# Patient Record
Sex: Female | Born: 1970 | Race: Black or African American | Hispanic: No | Marital: Married | Smoking: Never smoker
Health system: Southern US, Community
[De-identification: ages and names within clinical notes are randomized; demographics above are authoritative.]

## PROBLEM LIST (undated history)

## (undated) DIAGNOSIS — E119 Type 2 diabetes mellitus without complications: Secondary | ICD-10-CM

## (undated) DIAGNOSIS — R000 Tachycardia, unspecified: Secondary | ICD-10-CM

## (undated) DIAGNOSIS — R002 Palpitations: Secondary | ICD-10-CM

## (undated) DIAGNOSIS — I10 Essential (primary) hypertension: Secondary | ICD-10-CM

## (undated) DIAGNOSIS — F419 Anxiety disorder, unspecified: Secondary | ICD-10-CM

## (undated) DIAGNOSIS — D649 Anemia, unspecified: Secondary | ICD-10-CM

## (undated) DIAGNOSIS — F32A Depression, unspecified: Secondary | ICD-10-CM

## (undated) DIAGNOSIS — G43909 Migraine, unspecified, not intractable, without status migrainosus: Secondary | ICD-10-CM

## (undated) DIAGNOSIS — D219 Benign neoplasm of connective and other soft tissue, unspecified: Secondary | ICD-10-CM

## (undated) DIAGNOSIS — E042 Nontoxic multinodular goiter: Secondary | ICD-10-CM

## (undated) DIAGNOSIS — R7611 Nonspecific reaction to tuberculin skin test without active tuberculosis: Secondary | ICD-10-CM

## (undated) DIAGNOSIS — K760 Fatty (change of) liver, not elsewhere classified: Secondary | ICD-10-CM

## (undated) DIAGNOSIS — R5383 Other fatigue: Secondary | ICD-10-CM

## (undated) DIAGNOSIS — F329 Major depressive disorder, single episode, unspecified: Secondary | ICD-10-CM

## (undated) HISTORY — DX: Anemia, unspecified: D64.9

## (undated) HISTORY — DX: Benign neoplasm of connective and other soft tissue, unspecified: D21.9

## (undated) HISTORY — DX: Depression, unspecified: F32.A

## (undated) HISTORY — DX: Anxiety disorder, unspecified: F41.9

## (undated) HISTORY — DX: Tachycardia, unspecified: R00.0

## (undated) HISTORY — DX: Nonspecific reaction to tuberculin skin test without active tuberculosis: R76.11

## (undated) HISTORY — DX: Migraine, unspecified, not intractable, without status migrainosus: G43.909

## (undated) HISTORY — DX: Nontoxic multinodular goiter: E04.2

## (undated) HISTORY — DX: Major depressive disorder, single episode, unspecified: F32.9

## (undated) HISTORY — DX: Other fatigue: R53.83

## (undated) HISTORY — DX: Fatty (change of) liver, not elsewhere classified: K76.0

## (undated) HISTORY — DX: Palpitations: R00.2

## (undated) HISTORY — DX: Essential (primary) hypertension: I10

## (undated) SURGERY — DILATION AND CURETTAGE
Anesthesia: General

---

## 1995-09-12 HISTORY — PX: TUBAL LIGATION: SHX77

## 2003-09-12 HISTORY — PX: TUBOPLASTY / TUBOTUBAL ANASTOMOSIS: SUR1392

## 2004-09-26 ENCOUNTER — Emergency Department: Payer: Self-pay | Admitting: Emergency Medicine

## 2004-09-29 ENCOUNTER — Ambulatory Visit: Payer: Self-pay

## 2006-04-10 ENCOUNTER — Ambulatory Visit: Payer: Self-pay | Admitting: Gynecology

## 2006-04-26 ENCOUNTER — Ambulatory Visit: Payer: Self-pay | Admitting: Obstetrics & Gynecology

## 2006-06-18 ENCOUNTER — Ambulatory Visit: Payer: Self-pay | Admitting: Gynecology

## 2006-07-10 ENCOUNTER — Emergency Department: Payer: Self-pay | Admitting: Emergency Medicine

## 2006-07-16 ENCOUNTER — Ambulatory Visit: Payer: Self-pay | Admitting: Gynecology

## 2007-09-12 DIAGNOSIS — R7611 Nonspecific reaction to tuberculin skin test without active tuberculosis: Secondary | ICD-10-CM

## 2007-09-12 HISTORY — DX: Nonspecific reaction to tuberculin skin test without active tuberculosis: R76.11

## 2007-10-17 ENCOUNTER — Ambulatory Visit (HOSPITAL_COMMUNITY): Admission: RE | Admit: 2007-10-17 | Discharge: 2007-10-17 | Payer: Self-pay | Admitting: Gynecology

## 2007-10-17 ENCOUNTER — Ambulatory Visit: Payer: Self-pay | Admitting: Gynecology

## 2007-10-23 ENCOUNTER — Inpatient Hospital Stay (HOSPITAL_COMMUNITY): Admission: AD | Admit: 2007-10-23 | Discharge: 2007-10-23 | Payer: Self-pay | Admitting: Gynecology

## 2009-03-11 HISTORY — PX: APPENDECTOMY: SHX54

## 2009-10-18 LAB — CONVERTED CEMR LAB: Pap Smear: NORMAL

## 2009-10-21 ENCOUNTER — Encounter (INDEPENDENT_AMBULATORY_CARE_PROVIDER_SITE_OTHER): Payer: Self-pay | Admitting: *Deleted

## 2009-11-13 ENCOUNTER — Encounter: Payer: Self-pay | Admitting: Emergency Medicine

## 2009-11-13 ENCOUNTER — Observation Stay (HOSPITAL_COMMUNITY): Admission: EM | Admit: 2009-11-13 | Discharge: 2009-11-14 | Payer: Self-pay | Admitting: Internal Medicine

## 2009-11-13 ENCOUNTER — Ambulatory Visit: Payer: Self-pay | Admitting: Diagnostic Radiology

## 2009-11-14 ENCOUNTER — Encounter (INDEPENDENT_AMBULATORY_CARE_PROVIDER_SITE_OTHER): Payer: Self-pay | Admitting: Internal Medicine

## 2009-11-14 ENCOUNTER — Encounter (INDEPENDENT_AMBULATORY_CARE_PROVIDER_SITE_OTHER): Payer: Self-pay | Admitting: *Deleted

## 2009-11-17 ENCOUNTER — Telehealth: Payer: Self-pay | Admitting: Family

## 2009-11-17 ENCOUNTER — Ambulatory Visit: Payer: Self-pay | Admitting: Family

## 2009-11-17 DIAGNOSIS — F418 Other specified anxiety disorders: Secondary | ICD-10-CM | POA: Insufficient documentation

## 2009-11-26 ENCOUNTER — Telehealth: Payer: Self-pay | Admitting: Family

## 2009-11-28 HISTORY — PX: BIOPSY THYROID: PRO38

## 2009-12-01 ENCOUNTER — Encounter (INDEPENDENT_AMBULATORY_CARE_PROVIDER_SITE_OTHER): Payer: Self-pay | Admitting: *Deleted

## 2009-12-22 ENCOUNTER — Encounter (INDEPENDENT_AMBULATORY_CARE_PROVIDER_SITE_OTHER): Payer: Self-pay | Admitting: *Deleted

## 2009-12-22 ENCOUNTER — Ambulatory Visit: Payer: Self-pay | Admitting: Family

## 2009-12-22 DIAGNOSIS — I10 Essential (primary) hypertension: Secondary | ICD-10-CM | POA: Insufficient documentation

## 2009-12-22 LAB — CONVERTED CEMR LAB
ALT: 11 units/L (ref 0–35)
AST: 15 units/L (ref 0–37)
Albumin: 4.5 g/dL (ref 3.5–5.2)
Alkaline Phosphatase: 61 units/L (ref 39–117)
BUN: 8 mg/dL (ref 6–23)
Basophils Absolute: 0 10*3/uL (ref 0.0–0.1)
Basophils Relative: 0 % (ref 0–1)
CO2: 24 meq/L (ref 19–32)
Calcium: 9.7 mg/dL (ref 8.4–10.5)
Chloride: 102 meq/L (ref 96–112)
Cholesterol: 162 mg/dL (ref 0–200)
Creatinine, Ser: 0.78 mg/dL (ref 0.40–1.20)
Eosinophils Absolute: 0.1 10*3/uL (ref 0.0–0.7)
Eosinophils Relative: 1 % (ref 0–5)
Glucose, Bld: 87 mg/dL (ref 70–99)
HCT: 34.4 % — ABNORMAL LOW (ref 36.0–46.0)
HDL: 47 mg/dL (ref >39)
Hemoglobin: 10.8 g/dL — ABNORMAL LOW (ref 12.0–15.0)
LDL Cholesterol: 102 mg/dL — ABNORMAL HIGH (ref 0–99)
Lymphocytes Relative: 32 % (ref 12–46)
Lymphs Abs: 2.5 10*3/uL (ref 0.7–4.0)
MCHC: 31.4 g/dL (ref 30.0–36.0)
MCV: 77.5 fL — ABNORMAL LOW (ref 78.0–100.0)
Monocytes Absolute: 0.6 10*3/uL (ref 0.1–1.0)
Monocytes Relative: 7 % (ref 3–12)
Neutro Abs: 4.9 10*3/uL (ref 1.7–7.7)
Neutrophils Relative %: 60 % (ref 43–77)
Platelets: 294 10*3/uL (ref 150–400)
Potassium: 4.8 meq/L (ref 3.5–5.3)
RBC: 4.44 M/uL (ref 3.87–5.11)
RDW: 17 % — ABNORMAL HIGH (ref 11.5–15.5)
Sodium: 137 meq/L (ref 135–145)
TSH: 1.071 microintl units/mL (ref 0.350–4.500)
Total Bilirubin: 0.6 mg/dL (ref 0.3–1.2)
Total CHOL/HDL Ratio: 3.4
Total Protein: 7.7 g/dL (ref 6.0–8.3)
Triglycerides: 66 mg/dL (ref <150)
VLDL: 13 mg/dL (ref 0–40)
WBC: 8.1 10*3/uL (ref 4.0–10.5)

## 2009-12-26 ENCOUNTER — Telehealth: Payer: Self-pay | Admitting: Family

## 2009-12-27 ENCOUNTER — Encounter: Payer: Self-pay | Admitting: Family

## 2009-12-27 LAB — CONVERTED CEMR LAB
Ferritin: 5 ng/mL — ABNORMAL LOW (ref 10–291)
Folate: 9.5 ng/mL
Iron: 14 ug/dL — ABNORMAL LOW (ref 42–145)
Saturation Ratios: 3 % — ABNORMAL LOW (ref 20–55)
TIBC: 450 ug/dL (ref 250–470)
UIBC: 436 ug/dL
Vitamin B-12: 357 pg/mL (ref 211–911)

## 2009-12-28 ENCOUNTER — Ambulatory Visit: Payer: Self-pay | Admitting: Hematology & Oncology

## 2009-12-28 ENCOUNTER — Ambulatory Visit: Payer: Self-pay

## 2009-12-28 ENCOUNTER — Encounter: Payer: Self-pay | Admitting: Cardiovascular Disease

## 2009-12-28 ENCOUNTER — Telehealth: Payer: Self-pay | Admitting: Family

## 2009-12-28 ENCOUNTER — Ambulatory Visit: Payer: Self-pay | Admitting: Cardiovascular Disease

## 2009-12-30 ENCOUNTER — Ambulatory Visit: Payer: Self-pay | Admitting: Diagnostic Radiology

## 2009-12-30 ENCOUNTER — Ambulatory Visit (HOSPITAL_BASED_OUTPATIENT_CLINIC_OR_DEPARTMENT_OTHER): Admission: RE | Admit: 2009-12-30 | Discharge: 2009-12-30 | Payer: Self-pay | Admitting: Internal Medicine

## 2009-12-30 LAB — HM MAMMOGRAPHY: HM Mammogram: NORMAL

## 2010-01-05 ENCOUNTER — Encounter: Payer: Self-pay | Admitting: Internal Medicine

## 2010-01-05 ENCOUNTER — Encounter: Payer: Self-pay | Admitting: Family

## 2010-01-05 LAB — CBC WITH DIFFERENTIAL (CANCER CENTER ONLY)
BASO#: 0 10*3/uL (ref 0.0–0.2)
Eosinophils Absolute: 0.1 10*3/uL (ref 0.0–0.5)
HCT: 28.5 % — ABNORMAL LOW (ref 34.8–46.6)
HGB: 9.3 g/dL — ABNORMAL LOW (ref 11.6–15.9)
LYMPH#: 2.6 10*3/uL (ref 0.9–3.3)
MONO#: 0.5 10*3/uL (ref 0.1–0.9)
NEUT%: 54.8 % (ref 39.6–80.0)
RBC: 3.79 10*6/uL (ref 3.70–5.32)
WBC: 7.1 10*3/uL (ref 3.9–10.0)

## 2010-01-05 LAB — TECHNOLOGIST REVIEW CHCC SATELLITE

## 2010-01-07 LAB — IRON AND TIBC
%SAT: 4 % — ABNORMAL LOW (ref 20–55)
Iron: 16 ug/dL — ABNORMAL LOW (ref 42–145)
TIBC: 451 ug/dL (ref 250–470)

## 2010-01-07 LAB — FERRITIN: Ferritin: 5 ng/mL — ABNORMAL LOW (ref 10–291)

## 2010-01-13 ENCOUNTER — Ambulatory Visit: Payer: Self-pay | Admitting: Family

## 2010-01-13 LAB — CONVERTED CEMR LAB: Fecal Occult Bld: NEGATIVE

## 2010-01-17 ENCOUNTER — Telehealth: Payer: Self-pay | Admitting: Family

## 2010-01-17 ENCOUNTER — Ambulatory Visit: Payer: Self-pay | Admitting: Family

## 2010-01-17 LAB — CONVERTED CEMR LAB
Basophils Absolute: 0 10*3/uL (ref 0.0–0.1)
Basophils Relative: 0 % (ref 0–1)
Eosinophils Absolute: 0 10*3/uL (ref 0.0–0.7)
Eosinophils Relative: 1 % (ref 0–5)
HCT: 35.9 % — ABNORMAL LOW (ref 36.0–46.0)
Hemoglobin: 10.9 g/dL — ABNORMAL LOW (ref 12.0–15.0)
Lymphocytes Relative: 28 % (ref 12–46)
Lymphs Abs: 1.9 10*3/uL (ref 0.7–4.0)
MCHC: 30.4 g/dL (ref 30.0–36.0)
MCV: 82.5 fL (ref 78.0–100.0)
Monocytes Absolute: 0.4 10*3/uL (ref 0.1–1.0)
Monocytes Relative: 6 % (ref 3–12)
Neutro Abs: 4.3 10*3/uL (ref 1.7–7.7)
Neutrophils Relative %: 65 % (ref 43–77)
Platelets: 252 10*3/uL (ref 150–400)
RBC: 4.35 M/uL (ref 3.87–5.11)
RDW: 21.1 % — ABNORMAL HIGH (ref 11.5–15.5)
WBC: 6.7 10*3/uL (ref 4.0–10.5)

## 2010-01-18 ENCOUNTER — Encounter: Payer: Self-pay | Admitting: Family

## 2010-02-15 ENCOUNTER — Ambulatory Visit: Payer: Self-pay | Admitting: Hematology & Oncology

## 2010-02-15 ENCOUNTER — Ambulatory Visit: Payer: Self-pay | Admitting: Family

## 2010-02-17 ENCOUNTER — Encounter: Payer: Self-pay | Admitting: Internal Medicine

## 2010-02-17 LAB — CBC WITH DIFFERENTIAL (CANCER CENTER ONLY)
BASO%: 0.5 % (ref 0.0–2.0)
Eosinophils Absolute: 0.1 10*3/uL (ref 0.0–0.5)
MCH: 28 pg (ref 26.0–34.0)
MONO#: 0.3 10*3/uL (ref 0.1–0.9)
MONO%: 4.7 % (ref 0.0–13.0)
NEUT#: 3.8 10*3/uL (ref 1.5–6.5)
Platelets: 182 10*3/uL (ref 145–400)
RBC: 4.75 10*6/uL (ref 3.70–5.32)
RDW: 19.8 % — ABNORMAL HIGH (ref 10.5–14.6)
WBC: 6.1 10*3/uL (ref 3.9–10.0)

## 2010-02-17 LAB — FERRITIN: Ferritin: 54 ng/mL (ref 10–291)

## 2010-03-16 ENCOUNTER — Encounter (INDEPENDENT_AMBULATORY_CARE_PROVIDER_SITE_OTHER): Payer: Self-pay | Admitting: *Deleted

## 2010-03-17 ENCOUNTER — Encounter (INDEPENDENT_AMBULATORY_CARE_PROVIDER_SITE_OTHER): Payer: Self-pay | Admitting: *Deleted

## 2010-04-07 ENCOUNTER — Encounter: Payer: Self-pay | Admitting: Cardiovascular Disease

## 2010-04-21 ENCOUNTER — Telehealth: Payer: Self-pay | Admitting: Family

## 2010-05-02 ENCOUNTER — Ambulatory Visit: Payer: Self-pay | Admitting: Family

## 2010-05-02 LAB — CONVERTED CEMR LAB
BUN: 12 mg/dL (ref 6–23)
Basophils Absolute: 0 10*3/uL (ref 0.0–0.1)
Basophils Relative: 0 % (ref 0–1)
CO2: 26 meq/L (ref 19–32)
Calcium: 9.5 mg/dL (ref 8.4–10.5)
Chloride: 107 meq/L (ref 96–112)
Creatinine, Ser: 0.86 mg/dL (ref 0.40–1.20)
Eosinophils Absolute: 0.1 10*3/uL (ref 0.0–0.7)
Eosinophils Relative: 1 % (ref 0–5)
Glucose, Bld: 98 mg/dL (ref 70–99)
HCT: 36.4 % (ref 36.0–46.0)
Hemoglobin: 12.2 g/dL (ref 12.0–15.0)
Lymphocytes Relative: 39 % (ref 12–46)
Lymphs Abs: 2.4 10*3/uL (ref 0.7–4.0)
MCHC: 33.5 g/dL (ref 30.0–36.0)
MCV: 87.1 fL (ref 78.0–100.0)
Monocytes Absolute: 0.4 10*3/uL (ref 0.1–1.0)
Monocytes Relative: 6 % (ref 3–12)
Neutro Abs: 3.3 10*3/uL (ref 1.7–7.7)
Neutrophils Relative %: 54 % (ref 43–77)
Platelets: 252 10*3/uL (ref 150–400)
Potassium: 4.7 meq/L (ref 3.5–5.3)
RBC: 4.18 M/uL (ref 3.87–5.11)
RDW: 15.2 % (ref 11.5–15.5)
Sodium: 140 meq/L (ref 135–145)
WBC: 6.2 10*3/uL (ref 4.0–10.5)

## 2010-05-03 ENCOUNTER — Encounter: Payer: Self-pay | Admitting: Family

## 2010-05-24 ENCOUNTER — Ambulatory Visit: Payer: Self-pay | Admitting: Hematology & Oncology

## 2010-05-24 DIAGNOSIS — D259 Leiomyoma of uterus, unspecified: Secondary | ICD-10-CM | POA: Insufficient documentation

## 2010-05-24 DIAGNOSIS — D508 Other iron deficiency anemias: Secondary | ICD-10-CM | POA: Insufficient documentation

## 2010-05-26 ENCOUNTER — Emergency Department (HOSPITAL_BASED_OUTPATIENT_CLINIC_OR_DEPARTMENT_OTHER): Admission: EM | Admit: 2010-05-26 | Discharge: 2010-05-26 | Payer: Self-pay | Admitting: Emergency Medicine

## 2010-05-26 ENCOUNTER — Ambulatory Visit: Payer: Self-pay | Admitting: Diagnostic Radiology

## 2010-05-26 ENCOUNTER — Telehealth: Payer: Self-pay | Admitting: Family

## 2010-05-27 ENCOUNTER — Ambulatory Visit: Payer: Self-pay | Admitting: Family

## 2010-05-27 ENCOUNTER — Telehealth: Payer: Self-pay | Admitting: Family

## 2010-05-27 DIAGNOSIS — N920 Excessive and frequent menstruation with regular cycle: Secondary | ICD-10-CM | POA: Insufficient documentation

## 2010-05-27 LAB — CONVERTED CEMR LAB: Iron: 33 ug/dL — ABNORMAL LOW (ref 42–145)

## 2010-05-30 ENCOUNTER — Encounter: Payer: Self-pay | Admitting: Cardiovascular Disease

## 2010-05-30 LAB — CONVERTED CEMR LAB
Metaneph Total, Ur: 622 ug/24hr
Metanephrines, Ur: 99
Normetanephrine, 24H Ur: 523

## 2010-06-01 ENCOUNTER — Ambulatory Visit: Payer: Self-pay | Admitting: Cardiovascular Disease

## 2010-06-01 ENCOUNTER — Encounter: Payer: Self-pay | Admitting: Family

## 2010-06-08 ENCOUNTER — Encounter: Payer: Self-pay | Admitting: Family

## 2010-06-10 ENCOUNTER — Ambulatory Visit: Payer: Self-pay | Admitting: Family

## 2010-06-13 ENCOUNTER — Telehealth: Payer: Self-pay | Admitting: Family

## 2010-06-14 ENCOUNTER — Telehealth: Payer: Self-pay | Admitting: Family

## 2010-06-14 ENCOUNTER — Ambulatory Visit: Payer: Self-pay | Admitting: Family

## 2010-06-22 ENCOUNTER — Telehealth: Payer: Self-pay | Admitting: Family

## 2010-06-27 ENCOUNTER — Ambulatory Visit: Payer: Self-pay | Admitting: Cardiovascular Disease

## 2010-06-27 ENCOUNTER — Ambulatory Visit: Payer: Self-pay | Admitting: Family

## 2010-06-27 DIAGNOSIS — R0989 Other specified symptoms and signs involving the circulatory and respiratory systems: Secondary | ICD-10-CM | POA: Insufficient documentation

## 2010-07-06 ENCOUNTER — Encounter: Payer: Self-pay | Admitting: Family

## 2010-07-07 ENCOUNTER — Encounter: Payer: Self-pay | Admitting: Cardiovascular Disease

## 2010-07-07 ENCOUNTER — Telehealth: Payer: Self-pay | Admitting: Family

## 2010-07-18 ENCOUNTER — Ambulatory Visit: Payer: Self-pay

## 2010-07-18 ENCOUNTER — Encounter: Payer: Self-pay | Admitting: Cardiovascular Disease

## 2010-07-18 ENCOUNTER — Ambulatory Visit (HOSPITAL_COMMUNITY): Admission: RE | Admit: 2010-07-18 | Discharge: 2010-07-18 | Payer: Self-pay | Admitting: Cardiovascular Disease

## 2010-07-19 ENCOUNTER — Ambulatory Visit: Payer: Self-pay | Admitting: Family

## 2010-07-27 ENCOUNTER — Telehealth: Payer: Self-pay | Admitting: Family

## 2010-08-01 ENCOUNTER — Ambulatory Visit: Payer: Self-pay | Admitting: Internal Medicine

## 2010-08-01 DIAGNOSIS — K589 Irritable bowel syndrome without diarrhea: Secondary | ICD-10-CM | POA: Insufficient documentation

## 2010-08-08 ENCOUNTER — Telehealth: Payer: Self-pay | Admitting: Internal Medicine

## 2010-09-22 ENCOUNTER — Telehealth: Payer: Self-pay | Admitting: Internal Medicine

## 2010-09-23 ENCOUNTER — Ambulatory Visit: Admit: 2010-09-23 | Payer: Self-pay | Admitting: Internal Medicine

## 2010-09-26 ENCOUNTER — Emergency Department (HOSPITAL_BASED_OUTPATIENT_CLINIC_OR_DEPARTMENT_OTHER)
Admission: EM | Admit: 2010-09-26 | Discharge: 2010-09-26 | Payer: Self-pay | Source: Home / Self Care | Admitting: Emergency Medicine

## 2010-09-27 ENCOUNTER — Encounter
Admission: RE | Admit: 2010-09-27 | Discharge: 2010-09-27 | Payer: Self-pay | Source: Home / Self Care | Attending: Internal Medicine | Admitting: Internal Medicine

## 2010-09-27 ENCOUNTER — Ambulatory Visit
Admission: RE | Admit: 2010-09-27 | Discharge: 2010-09-27 | Payer: Self-pay | Source: Home / Self Care | Attending: Family | Admitting: Family

## 2010-09-27 ENCOUNTER — Encounter: Payer: Self-pay | Admitting: Family

## 2010-09-27 DIAGNOSIS — R209 Unspecified disturbances of skin sensation: Secondary | ICD-10-CM | POA: Insufficient documentation

## 2010-09-28 ENCOUNTER — Telehealth: Payer: Self-pay | Admitting: Family

## 2010-09-28 LAB — CBC
HCT: 31.6 % — ABNORMAL LOW (ref 36.0–46.0)
Hemoglobin: 9.9 g/dL — ABNORMAL LOW (ref 12.0–15.0)
MCH: 21.8 pg — ABNORMAL LOW (ref 26.0–34.0)
MCHC: 31.3 g/dL (ref 30.0–36.0)
MCV: 69.6 fL — ABNORMAL LOW (ref 78.0–100.0)
Platelets: 333 10*3/uL (ref 150–400)
RBC: 4.54 MIL/uL (ref 3.87–5.11)
RDW: 16.1 % — ABNORMAL HIGH (ref 11.5–15.5)
WBC: 8.5 10*3/uL (ref 4.0–10.5)

## 2010-09-28 LAB — POCT CARDIAC MARKERS
CKMB, poc: 1.6 ng/mL (ref 1.0–8.0)
CKMB, poc: 2.2 ng/mL (ref 1.0–8.0)
Myoglobin, poc: 96 ng/mL (ref 12–200)
Myoglobin, poc: 81.1 ng/mL (ref 12–200)
Troponin i, poc: <0.05 ng/mL (ref 0.00–0.09)
Troponin i, poc: <0.05 ng/mL (ref 0.00–0.09)

## 2010-09-28 LAB — URINE MICROSCOPIC-ADD ON

## 2010-09-28 LAB — URINALYSIS, ROUTINE W REFLEX MICROSCOPIC
Bilirubin Urine: NEGATIVE
Hgb urine dipstick: NEGATIVE
Ketones, ur: NEGATIVE mg/dL
Nitrite: NEGATIVE
Protein, ur: 30 mg/dL — AB
Specific Gravity, Urine: 1.028 (ref 1.005–1.030)
Urine Glucose, Fasting: NEGATIVE mg/dL
Urobilinogen, UA: 1 mg/dL (ref 0.0–1.0)
pH: 6 (ref 5.0–8.0)

## 2010-09-28 LAB — COMPREHENSIVE METABOLIC PANEL
ALT: 24 U/L (ref 0–35)
AST: 23 U/L (ref 0–37)
Albumin: 4.1 g/dL (ref 3.5–5.2)
Alkaline Phosphatase: 65 U/L (ref 39–117)
BUN: 11 mg/dL (ref 6–23)
CO2: 25 mEq/L (ref 19–32)
Calcium: 9.2 mg/dL (ref 8.4–10.5)
Chloride: 106 mEq/L (ref 96–112)
Creatinine, Ser: 0.8 mg/dL (ref 0.4–1.2)
GFR calc Af Amer: >60 mL/min (ref >60)
GFR calc non Af Amer: >60 mL/min (ref >60)
Glucose, Bld: 116 mg/dL — ABNORMAL HIGH (ref 70–99)
Potassium: 4.3 mEq/L (ref 3.5–5.1)
Sodium: 144 mEq/L (ref 135–145)
Total Bilirubin: 0.9 mg/dL (ref 0.3–1.2)
Total Protein: 7.6 g/dL (ref 6.0–8.3)

## 2010-09-28 LAB — DIFFERENTIAL
Basophils Absolute: 0 10*3/uL (ref 0.0–0.1)
Basophils Relative: 0 % (ref 0–1)
Eosinophils Absolute: 0 10*3/uL (ref 0.0–0.7)
Eosinophils Relative: 0 % (ref 0–5)
Lymphocytes Relative: 15 % (ref 12–46)
Lymphs Abs: 1.3 10*3/uL (ref 0.7–4.0)
Monocytes Absolute: 0.6 10*3/uL (ref 0.1–1.0)
Monocytes Relative: 7 % (ref 3–12)
Neutro Abs: 6.6 10*3/uL (ref 1.7–7.7)
Neutrophils Relative %: 78 % — ABNORMAL HIGH (ref 43–77)

## 2010-09-28 LAB — PREGNANCY, URINE: Preg Test, Ur: NEGATIVE

## 2010-09-28 LAB — D-DIMER, QUANTITATIVE: D-Dimer, Quant: <0.22 ug/mL-FEU (ref 0.00–0.48)

## 2010-09-29 ENCOUNTER — Ambulatory Visit: Admit: 2010-09-29 | Payer: Self-pay | Admitting: Cardiovascular Disease

## 2010-10-02 ENCOUNTER — Encounter: Payer: Self-pay | Admitting: Internal Medicine

## 2010-10-04 ENCOUNTER — Ambulatory Visit: Payer: Self-pay | Admitting: Hematology & Oncology

## 2010-10-06 ENCOUNTER — Encounter: Payer: Self-pay | Admitting: Internal Medicine

## 2010-10-06 LAB — CBC WITH DIFFERENTIAL (CANCER CENTER ONLY)
BASO#: 0 10*3/uL (ref 0.0–0.2)
BASO%: 0.3 % (ref 0.0–2.0)
EOS%: 0.9 % (ref 0.0–7.0)
Eosinophils Absolute: 0 10*3/uL (ref 0.0–0.5)
HCT: 31.6 % — ABNORMAL LOW (ref 34.8–46.6)
HGB: 10.2 g/dL — ABNORMAL LOW (ref 11.6–15.9)
LYMPH#: 1.6 10*3/uL (ref 0.9–3.3)
LYMPH%: 33.5 % (ref 14.0–48.0)
MCH: 22.2 pg — ABNORMAL LOW (ref 26.0–34.0)
MCHC: 32.2 g/dL (ref 32.0–36.0)
MCV: 69 fL — ABNORMAL LOW (ref 81–101)
MONO#: 0.3 10*3/uL (ref 0.1–0.9)
MONO%: 5.9 % (ref 0.0–13.0)
NEUT#: 2.8 10*3/uL (ref 1.5–6.5)
NEUT%: 59.4 % (ref 39.6–80.0)
Platelets: 218 10*3/uL (ref 145–400)
RBC: 4.58 10*6/uL (ref 3.70–5.32)
RDW: 14.8 % — ABNORMAL HIGH (ref 10.5–14.6)
WBC: 4.6 10*3/uL (ref 3.9–10.0)

## 2010-10-06 LAB — RETICULOCYTES (CHCC)
ABS Retic: 45.4 10*3/uL (ref 19.0–186.0)
RBC.: 4.54 MIL/uL (ref 3.87–5.11)
Retic Ct Pct: 1 % (ref 0.4–3.1)

## 2010-10-06 LAB — CHCC SATELLITE - SMEAR

## 2010-10-06 LAB — FERRITIN: Ferritin: 6 ng/mL — ABNORMAL LOW (ref 10–291)

## 2010-10-11 NOTE — Letter (Signed)
Summary: [facility] Office [facility]  [facility] Office Note   Imported By: Lamona Curl CMA (AAMA) 05/25/2010 14:42:56  _____________________________________________________________________  External Attachment:    Type:   Image     Comment:   External Document

## 2010-10-11 NOTE — Letter (Signed)
Summary: [facility] Instructions  [facility]  [address] [place], [place] [address]   Phone: [phone]  Fax: [phone]       Michele [name]    Mar 18, 1971    MRN: [id number]       Procedure Day /Date: 08/08/10 Monday     Arrival Time: 3:00 pm     Procedure Time: 4:00 pm     Location of Procedure:                    _x _  [facility] (4th Floor)  PREPARATION FOR COLONOSCOPY WITH MIRALAX  Starting 5 days prior to your procedure 08/03/10 do not eat nuts, seeds, popcorn, corn, beans, peas,  salads, or any raw vegetables.  Do not take any fiber supplements (e.g. Metamucil, Citrucel, and Benefiber). ____________________________________________________________________________________________________   THE DAY BEFORE YOUR PROCEDURE         DATE: 08/07/10 DAY: Sunday  1   Drink clear liquids the entire day-NO SOLID FOOD  2   Do not drink anything colored red or purple.  Avoid juices with pulp.  No orange juice.  3   Drink at least 64 oz. (8 glasses) of fluid/clear liquids during the day to prevent dehydration and help the prep work efficiently.  CLEAR LIQUIDS INCLUDE: Water Jello Ice Popsicles Tea (sugar ok, no milk/cream) Powdered fruit flavored drinks Coffee (sugar ok, no milk/cream) Gatorade Juice: apple, white grape, white cranberry  Lemonade Clear bullion, consomm, broth Carbonated beverages (any kind) Strained chicken noodle soup Hard Candy  4   Mix the entire bottle of Miralax with 64 oz. of Gatorade/Powerade in the morning and put in the refrigerator to chill.  5   At 3:00 pm take 2 Dulcolax/Bisacodyl tablets.  6   At 4:30 pm take one Reglan/Metoclopramide tablet.  7  Starting at 5:00 pm drink one 8 oz glass of the Miralax mixture every 15-20 minutes until you have finished drinking the entire 64 oz.  You should finish drinking prep around 7:30 or 8:00 pm.  8   If you are nauseated, you may take the 2nd  Reglan/Metoclopramide tablet at 6:30 pm.        9    At 8:00 pm take 2 more DULCOLAX/Bisacodyl tablets.         THE DAY OF YOUR PROCEDURE      DATE:  08/08/10 DAY: Monday  You may drink clear liquids until 2:00 pm  (2 HOURS BEFORE PROCEDURE).   MEDICATION INSTRUCTIONS  Unless otherwise instructed, you should take regular prescription medications with a small sip of water as early as possible the morning of your procedure.          OTHER INSTRUCTIONS  You will need a responsible adult at least 41 years of age to accompany you and drive you home.   This person must remain in the waiting room during your procedure.  Wear loose fitting clothing that is easily removed.  Leave jewelry and other valuables at home.  However, you may wish to bring a book to read or an iPod/MP3 player to listen to music as you wait for your procedure to start.  Remove all body piercing jewelry and leave at home.  Total time from sign-in until discharge is approximately 2-3 hours.  You should go home directly after your procedure and rest.  You can resume normal activities the day after your procedure.  The day of your procedure you should not:   Drive  Make legal decisions   Operate machinery   Drink alcohol   Return to work  You will receive specific instructions about eating, activities and medications before you leave.   The above instructions have been reviewed and explained to me by  Lamona Curl CMA Duncan Dull)  August 01, 2010 11:40 AM     I fully understand and can verbalize these instructions _____________________________ Date 08/01/10

## 2010-10-11 NOTE — Progress Notes (Signed)
  Phone Note Call from Patient   Caller: Patient Call For: p Summary of Call: Pt called and told me that she was told that she would have a 2-d echo performed after d/c from the hospital.  Will order Initial call taken by: Lemont Fillers FNP,  November 17, 2009 5:45 PM

## 2010-10-11 NOTE — Letter (Signed)
Summary: [facility]: Immunoassay Fecal Occult Blood (iFOB) Order Psychologist, counselling at [facility]  [address]   [place], [place] [address]   Phone: [phone]  Fax: [phone]         [facility]: Immunoassay Fecal Occult Blood (iFOB) Order Form    December 27, 2009 MRN: [id number]     Michele [name] 02-05-1971     Physicican Name: Sandford Craze, NP  Diagnosis Code:  280.9      Mervin Kung CMA

## 2010-10-11 NOTE — Letter (Signed)
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 [address]   [place], [place] [address]   Phone: [phone]  Fax: [phone]    June 10, 2010   Employee:  WILMER BERRYHILL    To Whom It May Concern:   For Medical reasons, please excuse the above named employee from work for the following dates:  Start:   06/10/10  End:   06/20/10  If you need additional information, please feel free to contact our office.         Sincerely,    Lemont Fillers FNP

## 2010-10-11 NOTE — Letter (Signed)
Summary: New Patient letter  [facility]  [address] [place], [place] [address]   Phone: [phone]  Fax: [phone]       03/17/2010 MRN: [id number]  Michele [name] [address] [place], [place]  [address]  Dear [name],  Welcome to the Gastroenterology Division at [facility].    You are scheduled to see Dr. Jarold Motto on 04/20/2010 at 9:00AM on the 3rd floor at [facility], [address]  We ask that you try to arrive at our office 15 minutes prior to your appointment time to allow for check-in.  We would like you to complete the enclosed self-administered evaluation form prior to your visit and bring it with you on the day of your appointment.  We will review it with you.  Also, please bring a complete list of all your medications or, if you prefer, bring the medication bottles and we will list them.  Please bring your insurance card so that we may make a copy of it.  If your insurance requires a referral to see a specialist, please bring your referral form from your primary care physician.  Co-payments are due at the time of your visit and may be paid by cash, check or credit card.     Your office visit will consist of a consult with your physician (includes a physical exam), any laboratory testing he/she may order, scheduling of any necessary diagnostic testing (e.g. x-ray, ultrasound, CT-scan), and scheduling of a procedure (e.g. Endoscopy, Colonoscopy) if required.  Please allow enough time on your schedule to allow for any/all of these possibilities.    If you cannot keep your appointment, please call [phone] to cancel or reschedule prior to your appointment date.  This allows [name] the opportunity to schedule an appointment for another patient in need of care.  If you do not cancel or reschedule by 5 p.m. the business day prior to your appointment date, you will be charged a $50.00 late cancellation/no-show fee.    Thank  you for choosing [facility] for your medical needs.  We appreciate the opportunity to care for you.  Please visit [name] at our website  to learn more about our practice.                     Sincerely,                                                             The Gastroenterology Division

## 2010-10-11 NOTE — Progress Notes (Signed)
Summary: cough since going on lisinopril --lm  Phone Note Call from Patient Call back at [phone]   Caller: Patient Call For: Anant Agard Summary of Call: she has had a presistent cough since going on the lisinopril.  what should she do  Initial call taken by: Roselle Locus,  November 26, 2009 3:59 PM  Follow-up for Phone Call        Patient should stop lisinopril and start norvasc.  I have sent to her pharmacy and left message for patient to call us back to discuss. She needs follow up in 2 weeks please  11/29/09 Left message on machine at home and cell to return call. Tf,cma Follow-up by: Lemont Fillers FNP,  November 26, 2009 4:51 PM  Additional Follow-up for Phone Call Additional follow up Details #1::        Notified pt. of need to stop Lisinopril and start Norvasc.  Pt. stated that she had been taking the Lisinopril-HCTZ rx given to her when she was in the hospital. She started that rx after her visit with [name] on 11/17/09. She states she still has HCTZ at home. Do you want her to continue that in addition to Norvasc? Additional Follow-up by: Mervin Kung CMA,  November 29, 2009 4:42 PM    Additional Follow-up for Phone Call Additional follow up Details #2::    Patient can continue plain HCTZ  in addition to norvasc.  tell her to stop the combo pill (lisinopril/hctz) please.  Needs to keep upcoming appointment in April.  Pt. notified to continue HCTZ and she stated she had stopped Lisinopril-HCTZ on Friday.  Pt. aware of f/u in april. Mervin Kung CMA  November 29, 2009 5:05 PM  Follow-up by: Lemont Fillers FNP,  November 29, 2009 5:01 PM  New/Updated Medications: NORVASC 5 MG TABS (AMLODIPINE BESYLATE) one tablet by mouth daily Prescriptions: NORVASC 5 MG TABS (AMLODIPINE BESYLATE) one tablet by mouth daily  #30 x 0   Entered and Authorized by:   Lemont Fillers FNP   Signed by:   Lemont Fillers FNP on 11/26/2009   Method used:   Electronically to   [facility] [phone]* (retail)       [address]       [place], [place]  [address]       Ph: [phone]       Fax: [phone]   RxID:   [id number]

## 2010-10-11 NOTE — Assessment & Plan Note (Signed)
Summary: 6 week fu/dt rsch from bmp/dt--Rm 4   Vital Signs:  Patient profile:   40 year old female Height:      65.25 inches Weight:      178 pounds BMI:     29.50 Temp:     98.2 degrees F oral Pulse rate:   96 / minute Pulse rhythm:   regular Resp:     16 per minute BP sitting:   110 / 78  (right arm) Cuff size:   regular  Vitals Entered By: Mervin Kung CMA Duncan Dull) (June 10, 2010 8:35 AM) CC: Rm 4   6 week f/u. Is Patient Diabetic? No Comments States she has had episodes every day of increase heart rate, paleness. Doesn't think anxiety is causing symptoms. States she is taking all meds as prescribed. [name] CMA Duncan Dull)  June 10, 2010 8:42 AM    Primary Care Niquan Charnley:  Lemont Fillers FNP  CC:  Rm 4   6 week f/u.Marland Kitchen  History of Present Illness: [name] is a 40 year old female who presents today for follow up of her shortness of breath and heart palpitations.  She currently is wearing her cardiac event monitor at the direction of Dr. Tonny Bollman, which will be worn for a total of 30 days.  She tells me today that she is doing, "terribly."  Has constant feeling that her heart is racing.  This is accompanied by shortness of breath and light headedness.  Symptoms start before she gets into work and are worst while she is at work.  Symptoms are improved on the weekends.   She has tried alprazolam but notes "no improvement whatsoever" in these symptoms.    Allergies (verified): No Known Drug Allergies  Physical Exam  General:  Well-developed,well-nourished,in no acute distress; alert,appropriate and cooperative throughout examination Lungs:  Normal respiratory effort, chest expands symmetrically. Lungs are clear to auscultation, no crackles or wheezes. Heart:  Normal rate and regular rhythm. S1 and S2 normal without gallop, murmur, click, rub or other extra sounds. Extremities:  No clubbing, cyanosis, edema,  Neurologic:  alert & oriented X3 and  gait normal.   Psych:  Oriented X3 and moderately anxious.     Impression & Recommendations:  Problem # 1:  ANXIETY (ICD-300.00) Assessment Deteriorated Urine catecholemines were negative. Suspect that her symptoms are due to anxiety.  She is undergoing a cardiac event monitor which will be helpful in ruling out any cardiac arrythmias or severe tachycardia.  She has been intolerant of SSRI's- will give patient a trial of Effexor and will try Ativan in place of alprazolam.  I did tell patient that if she does not have improvment with 0.5mg , that she can try increasing to two tablets to see if this is more helpful.  Will also check a D Dimer to be certain that PE is not a possibility. 20 minutes were spent with patient.  Greater than 50% of this time was spent counseling patient on her anxiety. The following medications were removed from the medication list:    Alprazolam 0.5 Mg Tabs (Alprazolam) ..... One tablet by mouth every 8 hours as needed for anxiety Her updated medication list for this problem includes:    Ativan 0.5 Mg Tabs (Lorazepam) ..... One tablet by mouth three times a day as needed    Venlafaxine Hcl 37.5 Mg Tabs (Venlafaxine hcl) ..... 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  Will request consultation.  She follows with Dr. Arlan Organ for her IV iron infusions.  Complete Medication List: 1)  Norvasc 5 Mg Tabs (Amlodipine besylate) .... One and one half (7.5mg ) by mouth daily 2)  Hydrochlorothiazide 25 Mg Tabs (Hydrochlorothiazide) .... Take 1 tablet by mouth once a day 3)  Ativan 0.5 Mg Tabs (Lorazepam) .... One tablet by mouth three times a day as needed 4)  Venlafaxine Hcl 37.5 Mg Tabs (Venlafaxine hcl) .... One tablet by mouth two times a day  Other Orders: D-Dimer- [facility] ([id number])  Patient Instructions: 1)  Please follow up in 1  month. Prescriptions: VENLAFAXINE HCL 37.5 MG TABS (VENLAFAXINE HCL) one tablet by mouth two times a day  #60 x 1   Entered and Authorized by:   Lemont Fillers FNP   Signed by:   Lemont Fillers FNP on 06/10/2010   Method used:   Electronically to        [facility] # [phone]* (retail)       [address]       [place], [place]  [address]       Ph: [phone]       Fax: [phone]   RxID:   [id number] ATIVAN 0.5 MG TABS (LORAZEPAM) one tablet by mouth three times a day as needed  #60 x 0   Entered and Authorized by:   Lemont Fillers FNP   Signed by:   Lemont Fillers FNP on 06/10/2010   Method used:   Print then Give to Patient   RxID:   [id number]   Current Allergies (reviewed today): No known allergies

## 2010-10-11 NOTE — Letter (Signed)
Summary: [facility]  [facility]   Imported By: Lamona Curl CMA (AAMA) 05/25/2010 14:43:32  _____________________________________________________________________  External Attachment:    Type:   Image     Comment:   External Document

## 2010-10-11 NOTE — Letter (Signed)
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 [address]   [place], [place] [address]   Phone: [phone]  Fax: [phone]    June 27, 2010   Employee:  LAQUASIA PINCUS    To Whom It May Concern:   Ms.  [name] is medically cleared to return to work on 07/11/10.  If you need additional information, please feel free to contact our office.         Sincerely,    Lemont Fillers FNP

## 2010-10-11 NOTE — Letter (Signed)
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 [address]   [place], [place] [address]   Phone: [phone]  Fax: [phone]    July 19, 2010   Employee:  KORY PANJWANI    To Whom It May Concern:   For Medical reasons, please excuse the above named employee from work for the following dates:  Start:   07/18/10  End:   return 07/19/10  If you need additional information, please feel free to contact our office.         Sincerely,    Lemont Fillers FNP

## 2010-10-11 NOTE — Letter (Signed)
[facility]   Imported By: Lanelle Bal 03/11/2010 08:13:59  _____________________________________________________________________  External Attachment:    Type:   Image     Comment:   External Document

## 2010-10-11 NOTE — Consult Note (Signed)
[facility]   Imported By: Lanelle Bal 01/26/2010 08:12:44  _____________________________________________________________________  External Attachment:    Type:   Image     Comment:   External Document

## 2010-10-11 NOTE — Progress Notes (Signed)
  Phone Note Outgoing Call   Call placed by: Lemont Fillers FNP,  May 27, 2010 10:44 AM Call placed to: Insurer Summary of Call: Called [employer] to discuss Holter Monitor approval-  was originally ordered by Dr. Excell Seltzer and apparently declined.  Pt now with recurrent palpitations/tachycardia.  Awaiting call back from Physician at [facility] Initial call taken by: Lemont Fillers FNP,  May 27, 2010 10:45 AM  Follow-up for Phone Call        Received call back from [facility]- physician told me that the it was denied since it was ordered as mobile telemetry but that an "event monitor" would be covered without need for prior authorization.  Will discuss with Dr. Excell Seltzer. Follow-up by: Lemont Fillers FNP,  May 27, 2010 5:15 PM

## 2010-10-11 NOTE — Assessment & Plan Note (Signed)
Summary: BLOATING,ABD PAIN...AS.   History of Present Illness Visit Type: new patient  Primary GI MD: Lina Sar MD Primary Provider: Lemont Fillers FNP Requesting Provider: na Chief Complaint: Lower abd pain, bloating, GERD, and weight gain  History of Present Illness:   This is a 40 year old  African American female with bloating, gas and indigestion for the past year. She has a sedentary job working at a [employer]. She has gained about 20 pounds in the last 6 months. There is a history of anemia with a hemoglobin less than 9. She did receive an iron infusion on 2 occasions. There is a strong family history of colon cancer in her father who died of the disease at the age of 5. She has never had a colonoscopy. Her bowel habits are irregular alternating between diarrhea and constipation. She is treated for hypertension. She has a history of menorrhagia. There were 2 fibroids in her uterus based on a CT scan of the abdomen and pelvis.   GI Review of Systems    Reports abdominal pain, acid reflux, bloating, heartburn, and  weight gain.     Location of  Abdominal pain: lower abdomen.    Denies belching, chest pain, dysphagia with liquids, dysphagia with solids, loss of appetite, nausea, vomiting, vomiting blood, and  weight loss.        Denies anal fissure, black tarry stools, change in bowel habit, constipation, diarrhea, diverticulosis, fecal incontinence, heme positive stool, hemorrhoids, irritable bowel syndrome, jaundice, light color stool, liver problems, rectal bleeding, and  rectal pain.    Current Medications (verified): 1)  Norvasc 5 Mg Tabs (Amlodipine Besylate) .... One and One Half (7.5mg ) By Mouth Daily 2)  Ativan 0.5 Mg Tabs (Lorazepam) .... One Tablet By Mouth Three Times A Day As Needed 3)  Venlafaxine Hcl 37.5 Mg Tabs (Venlafaxine Hcl) .... Take Two Tablets By Mouth Two Times A Day 4)  Hydrochlorothiazide 25 Mg Tabs (Hydrochlorothiazide) .... 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 Appendectomy-- 03/2009 Tubal Ligation--1997 Tubal Anastomosis--2005  Family History: Reviewed history from 05/24/2010 and no changes required. Arthritis--maternal grandmother Stroke-- maternal grandfather HTN--mother, maternal grandmother Mom- Breast CA age 20,  HTN, living Dad-deceased CA colon- age 54. 2 brothers- one brother handicapped due to complications from spinal meningitis/stroke/seizures. Other brother is healthy  2 sons (73 and 71)- youngest son with asthma/hives? etiology 34 year old grand-daughter- healthy  Social History: Claims Agent-Occupation  Alcohol use-yes (once a month) Regular exercise-yes Never Smoked Denies hx of drug use Married x 20 years Two Childern  Daily Caffeine Use: 1-2 daily   Review of Systems       The patient complains of anemia, anxiety-new, fatigue, and headaches-new.  The patient denies allergy/sinus, arthritis/joint pain, back pain, blood in urine, breast changes/lumps, change in vision, confusion, cough, coughing up blood, depression-new, fainting, fever, hearing problems, heart murmur, heart rhythm changes, itching, menstrual pain, muscle pains/cramps, night sweats, nosebleeds, pregnancy symptoms, shortness of breath, skin rash, sleeping problems, sore throat, swelling of feet/legs, swollen lymph glands, thirst -  excessive , urination - excessive , urination changes/pain, urine leakage, vision changes, and voice change.         Pertinent positive and negative review of systems were noted in the above HPI. All other ROS was otherwise negative.   Vital Signs:  Patient profile:   40 year old female Height:      65.25 inches Weight:      177 pounds BMI:     29.33 BSA:     1.88 Pulse rate:   92 / minute Pulse rhythm:   regular BP sitting:   132 / 84  (left arm) Cuff size:   regular  Vitals Entered By: Ok Anis CMA (August 01, 2010 10:48 AM)  Physical Exam  General:  very pleasant, alert and oriented. Eyes:  nonicteric. Mouth:  normal mucosa. Neck:  thyroid normal. Lungs:  Clear throughout to auscultation. Heart:  Regular rate and rhythm; no murmurs, rubs,  or bruits. Abdomen:  soft protuberant abdomen with no particular tenderness. Normoactive bowel sounds. No palpable mass. Liver edge at costal margin. Rectal:  normal perianal area with normal rectal sphincter tone. Stool is Hemoccult-negative. Extremities:  No clubbing, cyanosis, edema or deformities noted. Skin:  Intact without significant lesions or rashes. Psych:  Alert and cooperative. Normal mood and affect.   Impression & Recommendations:  Problem # 1:  IRRITABLE BOWEL SYNDROME (ICD-564.1)  Patient has symptoms consistent with IBS. She has irregular bowel habits and bloating. I have given her a booklet on gas and bloating and we will start her Prilosec 20 mg daily. She needs to lose some weight.  Orders: Colonoscopy (Colon)  Problem # 2:  Family Hx of COLON CANCER (ICD-153.9)  Patient is at high risk for colon cancer. Her father died at age 16. She is a good candidate for a screening colonoscopy. This will be scheduled at her earliest convenience.  Orders: Colonoscopy (Colon)  Problem # 3:  ANEMIA, IRON DEFICIENCY, MICROCYTIC (ICD-280.8) Patient has blood loss secondary to menorrhagia. We need to rule out GI blood  loss. Her stool today is Hemoccult negative.  Other Orders: Ultrasound Abdomen (UAS)  Patient Instructions: 1)  You have been scheduled for a coloscopy on 08/08/10. Please follow written prep instructions that were given to you today at your visit.  2)  You have been scheduled for an abdominal ultrasound at [facility] on 08/10/10 @ 8 am. You should arrive at 7:45 am for registration. 3)  Please pick up your prescriptions at the pharmacy. Electronic prescription(s) has already been sent for Prilosec 20 mg daily. 4)  Please pick up your prescription for Miralax, Dulcolax and Reglan at the pharmacy. An electronic presription has already been sent.  5)  You have been given samples of Align to take once daily. If this works well for you, it can be purchased over the counter. 6)  Excessive Gas Diet handout given.  7)  Copy sent to : Sandford Craze, FNP 8)  The medication list was reviewed and reconciled.  All changed / newly prescribed medications were explained.  A complete medication list was provided to the patient / caregiver. Prescriptions: PRILOSEC 20 MG CPDR (OMEPRAZOLE) Take 1 tablet by mouth once a day  #30 x 2   Entered by:   Lamona Curl CMA (AAMA)   Authorized by:   Hart Carwin MD   Signed by:   Lamona Curl CMA (AAMA) on 08/01/2010   Method used:   Electronically to  [facility]  [address] # [phone]* (retail)       [id number]       [place], [place]  [address]       Ph: [phone]       Fax: [phone]   RxID:   [id number] DULCOLAX 5 MG  TBEC (BISACODYL) Day before procedure take 2 at 3pm and 2 at 8pm.  #4 x 0   Entered by:   Lamona Curl CMA (AAMA)   Authorized by:   Hart Carwin MD   Signed by:   Lamona Curl CMA (AAMA) on 08/01/2010   Method used:   Electronically to        [facility] # [phone]* (retail)       [address]       [place], [place]  [address]       Ph:  [phone]       Fax: [phone]   RxID:   [id number] REGLAN 10 MG  TABS (METOCLOPRAMIDE HCL) As per prep instructions.  #2 x 0   Entered by:   Lamona Curl CMA (AAMA)   Authorized by:   Hart Carwin MD   Signed by:   Lamona Curl CMA (AAMA) on 08/01/2010   Method used:   Electronically to        [facility] # [phone]* (retail)       [address]       [place], [place]  [address]       Ph: [phone]       Fax: [phone]   RxID:   [id number] MIRALAX   POWD (POLYETHYLENE GLYCOL 3350) As per prep  instructions.  #255gm x 0   Entered by:   Lamona Curl CMA (AAMA)   Authorized by:   Hart Carwin MD   Signed by:   Lamona Curl CMA (AAMA) on 08/01/2010   Method used:   Electronically to        [facility] # [phone]* (retail)       [address]       [place], [place]  [address]       Ph: [phone]       Fax: [phone]   RxID:   [id number]   Appended Document: BLOATING,ABD PAIN...AS. ---- 08/18/2010 10:16 AM, Karen Kitchens Nelson-Smith CMA (AAMA) wrote: just an fyi, patient no showed appointment for abdominal ultrasound on 08/10/10. I have tried to contact patient but there was no answer and no voicemail.  ---- 08/18/2010 1:01 PM, Hart Carwin MD wrote: she was a same day no show for colonoscopy 11/28 and again on 08/16/2010. So I guess she is not  interested in the work up.

## 2010-10-11 NOTE — Progress Notes (Signed)
Summary: side effect concerns  Phone Note Call from Patient Call back at [phone]   Caller: Patient Call For: Lemont Fillers FNP Summary of Call: Pt left voice message stating that she read Venlafaxine can cause weight gain. Pt states she has been struggling with weight gain over the last 6 months and wants to know if there is something else we can prescribe?  Follow-up for Phone Call        Spoke with patient- will plan to have her try effexor- will monitor weight closely. Follow-up by: Lemont Fillers FNP,  June 13, 2010 10:00 AM

## 2010-10-11 NOTE — Assessment & Plan Note (Signed)
Summary: f/u, would like to change med? / tf,cma--Rm 5   Vital Signs:  Patient profile:   40 year old female Height:      65.25 inches Weight:      177.50 pounds BMI:     29.42 Temp:     97.8 degrees F oral Pulse rate:   78 / minute Pulse rhythm:   regular Resp:     18 per minute BP sitting:   120 / 90  (right arm) Cuff size:   large  Vitals Entered By: Mervin Kung CMA Duncan Dull) (June 27, 2010 9:25 AM) CC: Rm 5  Pt states she feels some better on anxiety med but is still having to sleep 2-3 hours a day after taking the med. Is Patient Diabetic? No Pain Assessment Patient in pain? no      Comments Pt agrees all med doses and directions are correct. [name] CMA Duncan Dull)  June 27, 2010 9:31 AM    Primary Care Provider:  Lemont Fillers FNP  CC:  Rm 5  Pt states she feels some better on anxiety med but is still having to sleep 2-3 hours a day after taking the med.Marland Kitchen  History of Present Illness: [name] is a 40 year old female who presents today for follow up of her anxiety.  She has been on Effexor for approximately 2 weeks.  Since starting this medication, she notes that her panic attacks are more intermittent in nature.  They are more brief.  She is using lorazepam sparingly- has used 4-5 tabs since her last visit.  She is still home from work.  She noted that she was very somnolent the first few days that she was on the medication.  She remains sleepy during the day but this is improving and she is not able to go about her day.  She does nap however.  She completed her event monitor and is scheduled to follow up with Dr. Excell Seltzer today to discuss.   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, untreated  FIBROIDS, UTERUS (ICD-218.9) Family Hx of COLON CANCER (ICD-153.9) ANEMIA, IRON DEFICIENCY, MICROCYTIC  (ICD-280.8) PALPITATIONS, RECURRENT (ICD-785.1) FATIGUE (ICD-780.79) UNSPECIFIED TACHYCARDIA (ICD-785.0) ANEMIA, HYPOCHROMIC (ICD-280.9) COUGH (ICD-786.2) HYPERTENSION (ICD-401.9) PREVENTIVE HEALTH CARE (ICD-V70.0) CHEST PAIN, ATYPICAL, HX OF (ICD-V15.89) ANXIETY (ICD-300.00)    Past Surgical History: Last updated: 11/17/2009 Appendectomy-- 03/2009 Tubal Ligation--1997 Tubal Anastomosis--2005  Physical Exam  General:  Well-developed,well-nourished,in no acute distress; alert,appropriate and cooperative throughout examination Psych:  Oriented X3, memory intact for recent and remote, normally interactive, good eye contact, not anxious appearing, and not depressed appearing.     Impression & Recommendations:  Problem # 1:  ANXIETY (ICD-300.00) Assessment Improved Symptoms are improving.  Somnolence is her main complaint at this point which seems to be improving.  I recommended that she give the medication some more time relative to the sleepiness.  She is feeling comfortable with returning to work. Form filled for return on 10/31.  15 minutes were spent with patient.  Greater than 50% of this time was spent counseling patient on her anxiety. Her updated medication list for this problem includes:    Ativan 0.5 Mg Tabs (Lorazepam) ..... One tablet by mouth three times a day as needed    Venlafaxine Hcl 37.5 Mg Tabs (Venlafaxine hcl) ..... One tablet by mouth two times a day  Complete Medication List: 1)  Norvasc  5 Mg Tabs (Amlodipine besylate) .... One and one half (7.5mg ) by mouth daily 2)  Hydrochlorothiazide 25 Mg Tabs (Hydrochlorothiazide) .... Take 1 tablet by mouth once a day 3)  Ativan 0.5 Mg Tabs (Lorazepam) .... One tablet by mouth three times a day as needed 4)  Venlafaxine Hcl 37.5 Mg Tabs (Venlafaxine hcl) .... One tablet by mouth two times a day  Patient Instructions: 1)  Please schedule a follow-up appointment in 1 month. 2)  Good luck with your return to work.     Orders Added: 1)  Est. Patient Level III [[address]]    Current Allergies (reviewed today): No known allergies

## 2010-10-11 NOTE — Miscellaneous (Signed)
Summary: norvasc  Clinical Lists Changes  Medications: Rx of NORVASC 5 MG TABS (AMLODIPINE BESYLATE) one tablet by mouth daily;  #30 x 0;  Signed;  Entered by: Mervin Kung CMA;  Authorized by: Lemont Fillers FNP;  Method used: Electronically to [facility] # [phone]*, [address] [place], [place], [place]  [address], Ph: [phone], Fax: [phone]    Prescriptions: NORVASC 5 MG TABS (AMLODIPINE BESYLATE) one tablet by mouth daily  #30 x 0   Entered by:   Mervin Kung CMA   Authorized by:   Lemont Fillers FNP   Signed by:   Mervin Kung CMA on 12/01/2009   Method used:   Electronically to        [facility] # [phone]* (retail)       [address]       [place], [place]  [address]       Ph: [phone]       Fax: [phone]   RxID:   [id number]  Received call from pt. stating that Norvasc rx should have been sent to [facility] on [address] in [address] instead of [place]. Rx sent to corrected pharmacy location. Mervin Kung CMA  December 01, 2009 1:50 PM  Current Allergies: No known allergies

## 2010-10-11 NOTE — Assessment & Plan Note (Signed)
Summary: 1 MONTH FOLLOW UP/MHF   Vital Signs:  Patient profile:   40 year old female Height:      65.25 inches Weight:      172 pounds BMI:     28.51 Temp:     97.5 degrees F oral Pulse rate:   72 / minute Pulse rhythm:   regular Resp:     16 per minute BP sitting:   122 / 86  (right arm)  Vitals Entered By: Mervin Kung CMA (Jan 17, 2010 8:21 AM) CC: room 4  1 month follow up.  Pt states she is extremely tired, hematology told her she has a B12 deficiency.  Had another episode of elevated BP and heart rate and was unable to do the echo. Pt states they wanted her to wear heart monitor but insurance will not cover it?   Primary Care Provider:  Lemont Fillers FNP  CC:  room 4  1 month follow up.  Pt states she is extremely tired and hematology told her she has a B12 deficiency.  Had another episode of elevated BP and heart rate and was unable to do the echo. Pt states they wanted her to wear heart monitor but insurance will not cover it?Marland Kitchen  History of Present Illness: Michele [name] is a 40 year old female who presents today for folllow up.    1) Anemia- Since her last visit she has seen Dr. Arlan Organ in consultation for her iron deficiency anemia.  In addition, she has received two IV iron infusions.  She reports continuing to feel very fatigued despite sleeping greater than 8 hours each night.   2) Anxiety-well controlled on Citalopram.  She denies any panic attacks.  3) Palpitations-  continue to have 2-3x a week.  She saw Dr. Excell Seltzer, and per review of consultation she was having sinus tach 130's at the time of her appointment.  As a result, he postponed her 2-D echo and ordered a Holter monitor which she has not yet completed since her insurance apparently denied prior authorization.       Allergies (verified): No Known Drug Allergies  Physical Exam  General:  Well-developed,well-nourished,in no acute distress; alert,appropriate and cooperative throughout  examination Lungs:  Normal respiratory effort, chest expands symmetrically. Lungs are clear to auscultation, no crackles or wheezes. Heart:  Normal rate and regular rhythm. S1 and S2 normal without gallop, murmur, click, rub or other extra sounds. Extremities:  No edema   Impression & Recommendations:  Problem # 1:  HYPERTENSION (ICD-401.9) Assessment Deteriorated Noted that patient's BP was elevated at cardiology.  (160/100) This was during a tachycardic episode.  She continues to have elevated DBP.  Will increase Norvasck from 5- 7.5mg  by mouth daily.  Given irregular tachycardia and labile BP's will also send her for 24 hour urine for metanephrines/catacholemines to r/o pheochromocytoma.  Her updated medication list for this problem includes:    Norvasc 5 Mg Tabs (Amlodipine besylate) ..... One and one half (7.5mg ) by mouth daily    Hydrochlorothiazide 25 Mg Tabs (Hydrochlorothiazide) .Marland Kitchen... Take 1 tablet by mouth once a day  BP today: 122/86 Prior BP: 160/100 (12/28/2009)  Labs Reviewed: K+: 4.8 (12/22/2009) Creat: : 0.78 (12/22/2009)   Chol: 162 (12/22/2009)   HDL: 47 (12/22/2009)   LDL: 102 (12/22/2009)   TG: 66 (12/22/2009)  Problem # 2:  ANEMIA, HYPOCHROMIC (ICD-280.9) Assessment: Comment Only Will repeat CBC, last Hgb at Dr. Gustavo Lah office was 9.3.  Pt is s/p IV iron infusions,  willl repeat today.  B12 level was normal.   The following medications were removed from the medication list:    Ferrous Sulfate 325 (65 Fe) Mg Tabs (Ferrous sulfate) .Marland Kitchen... Take 1 tablet by mouth three times a day  Problem # 3:  UNSPECIFIED TACHYCARDIA (ICD-785.0) Assessment: Unchanged f/u with cardiology as scheduled.  (note sent to Dr. Excell Seltzer re: insurance denial of Holter monitor) Will check urine studies (methanephrines/catacholemines).  It is also possible that patient's anemia is a contributing factor in her tachycardia.    Complete Medication List: 1)  Norvasc 5 Mg Tabs (Amlodipine besylate)  .... One and one half (7.5mg ) by mouth daily 2)  Hydrochlorothiazide 25 Mg Tabs (Hydrochlorothiazide) .... Take 1 tablet by mouth once a day 3)  Citalopram Hydrobromide 20 Mg Tabs (Citalopram hydrobromide) .... One tab by mouth daily  Other Orders: T-CBC w/Diff ([id number])  Patient Instructions: 1)  Increase Norvasc to 7.5mg . 2)  Follow up in 1 month. 3)  Complete blood work prior to leaving. Prescriptions: NORVASC 5 MG TABS (AMLODIPINE BESYLATE) one and one half (7.5mg ) by mouth daily  #45 x 1   Entered and Authorized by:   Lemont Fillers FNP   Signed by:   Lemont Fillers FNP on 01/17/2010   Method used:   Electronically to        [facility] # [phone]* (retail)       [address]       [place], [place]  [address]       Ph: [phone]       Fax: [phone]   RxID:   [id number]   Current Allergies (reviewed today): No known allergies

## 2010-10-11 NOTE — Assessment & Plan Note (Signed)
Summary: rov /f/u moniter   Visit Type:  Follow-up Primary Provider:  Lemont Fillers FNP  CC:  follow up monitor.  History of Present Illness: 40 year-old woman presenting for followup of tachypalpitations.  To date an event recorder has been done and an echo was attempted but wasn't completed because of marked tachycardia. The event recorder showed sinus rhythm and sinus tachycardia.  The patient continues to have frequent feelings of heart racing and palpitations as well as shortness of breath. She has not had recurrent chest pain. No edema or other complaints. No syncope.  Current Medications (verified): 1)  Norvasc 5 Mg Tabs (Amlodipine Besylate) .... One and One Half (7.5mg ) By Mouth Daily 2)  Hydrochlorothiazide 25 Mg Tabs (Hydrochlorothiazide) .... Take 1 Tablet By Mouth Once A Day 3)  Ativan 0.5 Mg Tabs (Lorazepam) .... One Tablet By Mouth Three Times A Day As Needed 4)  Venlafaxine Hcl 37.5 Mg Tabs (Venlafaxine Hcl) .... One Tablet By Mouth Two Times A Day  Allergies (verified): No Known Drug Allergies  Past History:  Past medical history reviewed for relevance to current acute and chronic problems.  Past Medical History: Reviewed history from 05/24/2010 and no changes required. 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, untreated  FIBROIDS, UTERUS (ICD-218.9) Family Hx of COLON CANCER (ICD-153.9) ANEMIA, IRON DEFICIENCY, MICROCYTIC (ICD-280.8) PALPITATIONS, RECURRENT (ICD-785.1) FATIGUE (ICD-780.79) UNSPECIFIED TACHYCARDIA (ICD-785.0) ANEMIA, HYPOCHROMIC (ICD-280.9) COUGH (ICD-786.2) HYPERTENSION (ICD-401.9) PREVENTIVE HEALTH CARE (ICD-V70.0) CHEST PAIN, ATYPICAL, HX OF (ICD-V15.89) ANXIETY (ICD-300.00)    Review of Systems       Negative except as per HPI   Vital Signs:  Patient profile:   40 year old female Height:      65.25 inches Weight:      178 pounds BMI:     29.50 Pulse rate:   115 / minute Pulse rhythm:    irregular Resp:     18 per minute BP sitting:   128 / 90  (left arm) Cuff size:   large  Vitals Entered By: Vikki Ports (June 27, 2010 2:01 PM)  Physical Exam  General:  Pt is alert and oriented, in no acute distress. HEENT: normal Neck: normal carotid upstrokes without bruits, JVP normal Lungs: CTA CV: regular, tachycardic without murmur or gallop Abd: soft, NT, positive BS, no bruit, no organomegaly Ext: no clubbing, cyanosis, or edema. peripheral pulses 2+ and equal Skin: warm and dry without rash    EKG  Procedure date:  06/27/2010  Findings:      Sinus tachycardia 115bpm, otherwise within normal limits.  Impression & Recommendations:  Problem # 1:  TACHYCARDIA (ICD-785) Pt with sinus tachycardia and symptoms as described. She is not systemically ill. Urine metanephrines, TSH have been normal. Event recorder is unremarkable. Recommend check 2D echo and start metoprolol 25 mg two times a day. Will followup in 3 months.  Problem # 2:  HYPERTENSION (ICD-401.9) stable.  Her updated medication list for this problem includes:    Norvasc 5 Mg Tabs (Amlodipine besylate) ..... One and one half (7.5mg ) by mouth daily    Metoprolol Tartrate 25 Mg Tabs (Metoprolol tartrate) .Marland Kitchen... Take one tablet by mouth twice a day  BP today: 128/90 Prior BP: 120/90 (06/27/2010)  Labs Reviewed: K+: 4.7 (05/02/2010) Creat: : 0.86 (05/02/2010)   Chol: 162 (12/22/2009)   HDL: 47 (12/22/2009)   LDL: 102 (12/22/2009)   TG: 66 (12/22/2009)  Other Orders: EKG w/ Interpretation (93000) Echocardiogram (Echo)  Patient Instructions: 1)  Your  physician recommends that you schedule a follow-up appointment in: 3 months 2)  Your physician has recommended you make the following change in your medication: START Metoprolol 25mg  by mouth two times a day. 3)  Your physician has requested that you have an echocardiogram.  Echocardiography is a painless test that uses sound waves to create images of your  heart. It provides your doctor with information about the size and shape of your heart and how well your heart's chambers and valves are working.  This procedure takes approximately one hour. There are no restrictions for this procedure. Prescriptions: METOPROLOL TARTRATE 25 MG TABS (METOPROLOL TARTRATE) Take one tablet by mouth twice a day  #30 x 6   Entered by:   Whitney Maeola Sarah RN   Authorized by:   Norva Karvonen, MD   Signed by:   Ellender Hose RN on 06/27/2010   Method used:   Electronically to        [facility] # [phone]* (retail)       [address]       [place], [place]  [address]       Ph: [phone]       Fax: [phone]   RxID:   [id number]

## 2010-10-11 NOTE — Progress Notes (Signed)
Summary: problem with iron  Phone Note Outgoing Call   Summary of Call: Pls call patient and let her know that she is anemic. Other labs look good.  I would like her to return for the following labs: IFOB, B12/Folate, iron, TIBC, Ferrritin (280.9).  In the meantime- she should increase iron to three times a day. Initial call taken by: Lemont Fillers FNP,  December 26, 2009 10:34 PM  Follow-up for Phone Call        Pt notified of need to return for additional labs and stool test .  Pt. states she will complete these  today. Advised pt. to increase iron to t.i.d per Will[facility] Ferrall O'Sullivan's instructions.  Pt. states that the iron already makes her stomach hurt.  She currently takes it at night but says her stomach is sore in the a.m.  Please advise.     Mervin Kung CMA  December 27, 2009 12:13 PM   Additional Follow-up for Phone Call Additional follow up Details #1::        She can continue once a day for now.  If significant low iron level on labs I will refer her to hematology for possible IV iron infusion.  Additional Follow-up by: Lemont Fillers FNP,  December 27, 2009 1:39 PM  New Problems: ANEMIA, HYPOCHROMIC (ICD-280.9)   Additional Follow-up for Phone Call Additional follow up Details #2::    Left message on machine to return call.  Mervin Kung CMA  December 27, 2009 2:29 PM   Pt advised of Sharion Grieves's instructions and voices understanding.  Mervin Kung CMA  December 27, 2009 2:32 PM   New Problems: ANEMIA, HYPOCHROMIC (ICD-280.9) New/Updated Medications: FERROUS SULFATE 325 (65 FE) MG TABS (FERROUS SULFATE) Take 1 tablet by mouth three times a day

## 2010-10-11 NOTE — Letter (Signed)
Summary: Primary Care Consult Scheduled Letter  [place] [facility]  [address]   [place], [place] [address]   Phone: [phone]  Fax: [phone]      12/22/2009 MRN: [id number]  Michele [name] [address] [place], [place]  [address]    Dear [name],      We have scheduled an appointment for you.  At the recommendation of Sandford Craze FNP, we have scheduled you a consult with [facility], GASTROENTEROLOGY, AMANDA TAYLOR PA, FOR CONSULTION  on MAY 11TH at 8AM .  Their address is_[address], [place], [place] . The office phone number is [phone].  If this appointment day and time is not convenient for you, please feel free to call the office of the doctor you are being referred to at the number listed above and reschedule the appointment.     It is important for you to keep your scheduled appointments. We are here to make sure you are given good patient care. If you have questions or you have made changes to your appointment, please notify us at  [phone], ask for HELEN.    Thank you,  Darral Dash Patient Care Coordinator [place] [facility]

## 2010-10-11 NOTE — Letter (Signed)
Summary: Generic Letter  [facility], Main Office  [address]   [place], [place] [address]   Phone: [phone]  Fax: [phone]        April 07, 2010 MRN: [id number]    Michele [name] [address] [place], [place]  [address]    Dear [name],  Our office has attempted to reach you about obtaining a heart monitor.  Your insurance denied the heart monitor we ordered and Dr Excell Seltzer would like to know if you are still having palpitations and a fast heart rate.  Please call our office at [phone] to discuss your symptoms.   Sincerely,  Julieta Gutting, RN, BSN

## 2010-10-11 NOTE — Miscellaneous (Signed)
Summary: tetanus, ekg  Clinical Lists Changes  Orders: Added new Service order of Tdap => 4yrs IM (78295) - Signed Added new Service order of Admin 1st Vaccine (62130) - Signed Added new Service order of EKG w/ Interpretation (93000) - Signed Observations: Added new observation of TD BOOSTERLO: [id number] (12/22/2009 10:26) Added new observation of TD BOOST EXP: 11/06/2011 (12/22/2009 10:26) Added new observation of TD BOOSTERBY: Tricia Fergerson CMA (12/22/2009 10:26) Added new observation of TD BOOSTERRT: IM (12/22/2009 10:26) Added new observation of TDBOOSTERDSE: 0.5 ml (12/22/2009 10:26) Added new observation of TD BOOSTERMF: GlaxoSmithKline (12/22/2009 10:26) Added new observation of TD BOOST SIT: left deltoid (12/22/2009 10:26) Added new observation of TD BOOSTER: Tdap (12/22/2009 10:26)      Immunizations Administered:  Tetanus Vaccine:    Vaccine Type: Tdap    Site: left deltoid    Mfr: GlaxoSmithKline    Dose: 0.5 ml    Route: IM    Given by: Mervin Kung CMA    Exp. Date: 11/06/2011    Lot #: [id number]

## 2010-10-11 NOTE — Discharge Summary (Signed)
Summary: NonCardiac Chest Pain    NAME:  Michele [name], Michele [name]      ACCOUNT NO.:  [id number]      MEDICAL RECORD NO.:  [id number]          PATIENT TYPE:  INP      LOCATION:  3706                         FACILITY:  [facility]      PHYSICIAN:  Lonia Blood, M.D.       DATE OF BIRTH:  17-Apr-1971      DATE OF ADMISSION:  11/13/2009   DATE OF DISCHARGE:  11/14/2009                                  DISCHARGE SUMMARY      PRIMARY CARE PHYSICIAN:  This patient has been referred to [facility], Dr. Thomos Lemons.      DISCHARGE DIAGNOSES:   1. Chest pain, obviously noncardiac in nature - EKG is absolutely       normal - outpatient follow-up.   2. Hypertension.   3. Anxiety/panic attacks.   4. History of anemia - currently that has resolved.      DISCHARGE MEDICATIONS:   1. Xanax 0.25 mg by mouth 1/2 tablet every 6 hours as needed for       anxiety.   2. Lisinopril/hydrochlorothiazide 20/25 one tablet daily.      PROCEDURE THIS ADMISSION:   1. The patient underwent a head CT which was completely within normal       limits.   2. The patient underwent two repeat EKGs, one from November 13, 2009 and       one from November 14, 2009, they both were completely normal.      HISTORY AND PHYSICAL:  Refer to dictated H and P done by Dr. Adela Glimpse.      HOSPITAL COURSE:  [name] presented to the emergency room   with complaints of chest pain and numbness and tingling.  She apparently   has had multiple stressors in her life.  Her initial EKG was interpreted   as abnormal, but is actually within normal limits.  The patient had   three sets of cardiac enzymes which were all within normal limits.  She   has little to no risk factor for coronary artery disease, normal   hemoglobin A1c, normal homocystine, normal TSH , LDL cholesterol 122.   The patient will be set up with outpatient follow-up in the primary care   physician's office and she was told to continue her  antihypertensives   without change.               Lonia Blood, M.D.            SL/MEDQ  D:  11/15/2009  T:  11/15/2009  Job:  [id number]      Electronically Signed by Lonia Blood M.D. on 11/17/2009 03:37:04 PM

## 2010-10-11 NOTE — Progress Notes (Signed)
Summary: Return to Work Certificate  Phone Note Call from Patient   Caller: Patient Call For: Lemont Fillers FNP Summary of Call: Pt states she needs the Return To Work Certificate competed and faxed to [employer] @ [phone].  Employer received FMLA paperwork but stated that since FMLA leave was greater than 11 days we would need to complete the certificate as well. Please call pt once form has been faxed.  Form forwarded to Provider for completion.  [name] CMA Duncan Dull)  June 22, 2010 3:04 PM   Follow-up for Phone Call        attempted to call patient on home phone and cell phone.  Home phone does not have a voice mail.  Cell phone mail box is full Follow-up by: Lemont Fillers FNP,  June 22, 2010 4:11 PM  Additional Follow-up for Phone Call Additional follow up Details #1::        Spoke to pt and scheduled f/u for 06/27/10 @ 9:30am.  Pt would also like to discuss somnolence with med. at next visit. States that it is better than the first week she took it but it still makes her very sleepy.  [name] CMA (AAMA)  June 24, 2010 11:22 AM

## 2010-10-11 NOTE — Letter (Signed)
Summary: [facility]  [facility] Office Note   Imported By: Lamona Curl CMA (AAMA) 05/25/2010 14:42:29  _____________________________________________________________________  External Attachment:    Type:   Image     Comment:   External Document

## 2010-10-11 NOTE — Letter (Signed)
   [place] [facility] [address] [place], [place]  [address]  [place] Phone: [phone]      June 08, 2010   [facility] [address] [place], [place] [address]  RE:  LAB RESULTS  Dear  [name],  The following is an interpretation of your most recent lab tests.  Please take note of any instructions provided or changes to medications that have resulted from your lab work.   Your 24 hour urine studies are normal.     Sincerely Yours,    Lemont Fillers FNP  Appended Document:  Mailed.

## 2010-10-11 NOTE — Progress Notes (Signed)
Summary: hematology referral  Phone Note Outgoing Call   Summary of Call: Pls call patient and let her know that her iron is very low.  I would like to refer her to hematology for possible IV iron infusions.  She should still complete IFOB. Thanks Initial call taken by: Lemont Fillers FNP,  December 28, 2009 10:32 AM  Follow-up for Phone Call        Advised pt. per [facility] O'Sullivan's instructions. Pt. voices understanding.  She as been advised that we will be contacting her with the referral date/time.  Mervin Kung CMA  December 28, 2009 10:59 AM

## 2010-10-11 NOTE — Miscellaneous (Signed)
Summary: Orders Update  Clinical Lists Changes  Orders: Added new Referral order of Event (Event) - Signed 

## 2010-10-11 NOTE — Progress Notes (Signed)
Summary: additional test, status update  Phone Note Outgoing Call   Summary of Call: Please contact patient and let her know that after further review of her chart, I would also like her to complete a 24 hour urine for catecholamines and metanephrines (785).  She should be able to pick up container at lab downstairs and complete over next weekend when she is home.  This will let us screen to rule out any hormonal abnormality that could be contributing to her intermittent elevated BP and palpitations. Initial call taken by: Lemont Fillers FNP,  Jan 17, 2010 10:08 AM  Follow-up for Phone Call        Left message on machine to return my call.  Mervin Kung CMA  Jan 17, 2010 10:23 AM   Notified pt per [facility] instructions. Pt will pick up container in lab. Pt also states that she had an episode at work yesterday of elevated bp and palpitations. BP was 150/107.  Mervin Kung CMA  Jan 19, 2010 10:38 AM  Follow-up by: Lemont Fillers FNP,  Jan 19, 2010 11:00 AM

## 2010-10-11 NOTE — Progress Notes (Signed)
Summary: Xanax Refill  Phone Note Call from Patient Call back at [phone]   Caller: Patient Call For: Lemont Fillers FNP Summary of Call: Patient called and left voice message stating she was seen back in March and was provided a rx for Xanax. She would like to know if she could get a refill. Initial call taken by: Glendell Docker CMA,  April 21, 2010 4:14 PM  Follow-up for Phone Call        She is due for OV first- we need to recheck her BP. Thanks Follow-up by: Lemont Fillers FNP,  April 22, 2010 8:11 AM  Additional Follow-up for Phone Call Additional follow up Details #1::        call placed to patient at [phone], she was advised per [facility] instructions. She has scheduled office visit for Friday August 19th @ 8:15 am Additional Follow-up by: Glendell Docker CMA,  April 22, 2010 8:51 AM

## 2010-10-11 NOTE — Progress Notes (Signed)
Summary: return to work certification  Phone Note Outgoing Call   Summary of Call: [employer] Return to Work Certification completed and faxed to [phone] on 07/06/10 @ 5:20pm. Signed consent obtained previously. [name] CMA Duncan Dull)  July 27, 2010 9:16 AM

## 2010-10-11 NOTE — Assessment & Plan Note (Signed)
Summary: New to be est- Set up by hospital,Dr.laza- jr   Vital Signs:  Patient profile:   40 year old female Height:      65.25 inches Weight:      175.75 pounds BMI:     29.13 Temp:     97.8 degrees F oral Pulse rate:   84 / minute Pulse rhythm:   regular Resp:     16 per minute BP sitting:   122 / 84  (right arm) Cuff size:   regular  Vitals Entered By: Mervin Kung CMA (November 17, 2009 2:55 PM) Is Patient Diabetic? No   History of Present Illness: Michele [name] is a 40 year old female who presents today to establish care and to follow up from her recent hospitalizationf for atypical chest pain. Notes that she was uninsured until recently and would go to urgent cares as needed.  Pt was admitted to [facility] 3/6-3/7 and was diagnosed with atypical chest pain.  Anxiety-  notes that she sometimes feels like she can't catch her breath, wonders if this may be anxiety.  Notes some mild SOB/chest tightness today, some days are more than others.  Notes that she often worries about things and it often interferes with her day to day.  Notes that she has difficulty falling and staying asleep if she does not take sleep aid.  Was given xanax at discharge form [facility]- which she has used PRN for sleep.  She tells me that she has used Wellbutrin in the past- but this did not help her much.  Notes easy irritability.      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, untreated  Past Surgical History: Appendectomy-- 03/2009 Tubal Ligation--1997 Tubal  Anastomosis--2005  Family History: Arthritis--maternal grandmother Colon Cancer--father, deceased Breast Cancer--mother Stroke-- maternal grandfather HTN--mother, maternal grandmother  Social History: Alcohol use-yes Regular exercise-yes Never Smoked Smoking Status:  never Caffeine use/day:  2-3 cans daily Does Patient Exercise:  yes  Physical Exam  General:  Well-developed, overweight AA female, well-nourished,in no acute distress; alert,appropriate and cooperative throughout examination Head:  Normocephalic and atraumatic without obvious abnormalities. No apparent alopecia or balding. Chest Wall:  tender to palpation Lungs:  Normal respiratory effort, chest expands symmetrically. Lungs are clear to auscultation, no crackles or wheezes. Heart:  Normal rate and regular rhythm. S1 and S2 normal without gallop, murmur, click, rub or other extra sounds.   Impression & Recommendations:  Problem # 1:  ANXIETY (ICD-300.00) Assessment Deteriorated Will give patient a trial of fluoxetine for her anxiety.  Plan f/u in 1 month- Instructions for f/u provided as below.  We will plan a full physical next visit when she returns.  Her updated medication list for this problem includes:    Fluoxetine Hcl 10 Mg Caps (Fluoxetine hcl) .Marland Kitchen... Take one cap by mouth daily x 1 week, then increase as tolerated to 2 caps by mouth daily  Problem # 2:  CHEST PAIN, ATYPICAL, HX OF (ICD-V15.89) Assessment: Unchanged Patient has reproducible anterior wall  tenderness to palpation. EKG's were reportedly normal in hospital per d/c summary.  I have reviewed the discharge summary and laboratories from her most recent hospitalization.   I suspect that her symptoms are multifactorial: anxiety component + some costrochondritis.  Will add SSRI for anxiety, and I also recommended motrin as needed for chest tenderness  25 minutes was spent with patient- greater than 50% of this time was spent counselling patient on her  anxiety.    Complete Medication List: 1)  Ferrous Sulfate 325 (65 Fe) Mg Tabs (Ferrous sulfate) .... Take 1 tablet by mouth once a day 2)  Lisinopril 20 Mg Tabs (Lisinopril) .... Take 1 tablet by mouth once a day 3)  Hydrochlorothiazide 25 Mg Tabs (Hydrochlorothiazide) .... Take 1 tablet by mouth once a day 4)  Fluoxetine Hcl 10 Mg Caps (Fluoxetine hcl) .... Take one cap by mouth daily x 1 week, then increase as tolerated to 2 caps by mouth daily  Patient Instructions: 1)  Fluoxetine-  2)  It will likely take several weeks before you will notice improvement. 3)  Side effects of this medicine may include drowsiness or nausea.  If this becomes an issue for you call for further instructions. 4)  Very rarely people may develop suicidal thoughts when taking these types of medicines- should this happen to you, discontinue medication and go directly to the emergency room. 5)  Please arrange a follow up appointment in 1 month  Prescriptions: FLUOXETINE HCL 10 MG CAPS (FLUOXETINE HCL) take one cap by mouth daily x 1 week, then increase as tolerated to 2 caps by mouth daily  #60 x 1   Entered and Authorized by:   Lemont Fillers FNP   Signed by:   Lemont Fillers FNP on 11/17/2009   Method used:   Electronically to        [facility] # [phone]* (retail)       [address]       [place], [place]  [address]       Ph: [phone]       Fax: [phone]   RxID:   [id number]    Vital Signs:  Patient Profile:   40 year old female Height:     65.25 inches Weight:      175.75 pounds BMI:     29.13 Temp:     97.8 degrees F oral Pulse rate:   84 / minute Pulse rhythm:   regular Resp:     16 per minute BP sitting:   122 / 84 Cuff size:   regular                  Preventive Care Screening  Pap Smear:    Date:  10/18/2009    Results:  normal   PPD:    Date:  05/12/2008    Results:  positive    Current Allergies (reviewed today): No  known allergies

## 2010-10-11 NOTE — Progress Notes (Signed)
Summary: FMLA paperwork  Phone Note Other Incoming   Summary of Call: Received FMLA paperwork to be completed for [employer].  Forwarded to Provider for completion. [name] CMA Duncan Dull)  June 14, 2010 4:16 PM   Follow-up for Phone Call        FMLA form is ready for pick up.  Follow-up by: Lemont Fillers FNP,  June 15, 2010 9:37 AM  Additional Follow-up for Phone Call Additional follow up Details #1::        Unable to leave message on home or cell #. [name] CMA Duncan Dull)  June 15, 2010 10:12 AM     Additional Follow-up for Phone Call Additional follow up Details #2::    Spoke to pt. Pt will try to sign record release today. Pt is going out of town tomorrow through next Tuesday. [name] CMA Duncan Dull)  June 15, 2010 3:43 PM   FMLA paperwork faxed to [employer] HR dept [phone]. [name] CMA Duncan Dull)  June 16, 2010 9:25 AM

## 2010-10-11 NOTE — Consult Note (Signed)
Summary: [facility]  [facility]   Imported By: Lanelle Bal 06/21/2010 09:14:27  _____________________________________________________________________  External Attachment:    Type:   Image     Comment:   External Document

## 2010-10-11 NOTE — Progress Notes (Signed)
Summary: R/s echo  Phone Note Call from Patient Call back at [phone]   Summary of Call: Received call from pt stating she went to have her echo done today and had a "panic attack". Her heart rate was elevated and they were unable to do the test. They rescheduled her for 07/18/10. Pt states she took her Venlafaxine this a.m. as well as 2 alprazolam before the appt. Wants to know if there is something else she can try before the test to help her relax more?   Initial call taken by: Mervin Kung CMA Duncan Dull),  July 07, 2010 12:02 PM  Follow-up for Phone Call        Spoke with patient.  She reports 3 panic attacks a week.  Recommended that she try increasing venlafaxine to 2 tabs by mouth two times a day.  Also, was rx'd beta blocker by Dr. Excell Seltzer.  She is concerned about dropping her BP too low.  Recommended that she stop HCTZ and start the beta blocker as this may help prevent her palpatations and tachycardia.  Pt verbalizes understanding.  She is scheduled to return to work on [employer]. Follow-up by: Lemont Fillers FNP,  July 08, 2010 8:45 AM    New/Updated Medications: VENLAFAXINE HCL 37.5 MG TABS (VENLAFAXINE HCL) take two tablets by mouth two times a day

## 2010-10-11 NOTE — Miscellaneous (Signed)
Summary: Appointment Canceled  Appointment status changed to canceled by [employer] on 07/07/2010 9:03 AM.  Cancellation Comments --------------------- echo dx 780.2/sl  Appointment Information ----------------------- Appt Type:  CARDIOLOGY ANCILLARY VISIT      Date:  Thursday, July 07, 2010      Time:  8:30 AM for 60 min   Urgency:  Routine   Made By:  Hoy Finlay Scheduler  To Visit:  [id number]    Reason:  echo dx 780.2/sl  Appt Comments ------------- -- 07/07/10 9:03: ([facility]) CANCELED -- echo dx 780.2/sl -- 07/07/10 8:49: ([facility]) ARRIVED -- echo dx 780.2/sl -- 06/27/10 14:42: ([facility]) BOOKED -- Routine CARDIOLOGY ANCILLARY VISIT at 07/07/2010 8:30 AM for 60 min echo dx 780.2/sl

## 2010-10-11 NOTE — Assessment & Plan Note (Signed)
Summary: CPX/MHF   Vital Signs:  Patient profile:   40 year old female Height:      65.25 inches Weight:      171 pounds BMI:     28.34 O2 Sat:      100 % on Room air Temp:     98.4 degrees F oral Pulse rate:   84 / minute Pulse rhythm:   regular Resp:     16 per minute BP sitting:   124 / 90  (right arm) Cuff size:   regular  Vitals Entered By: Mervin Kung CMA (December 22, 2009 9:05 AM)  O2 Flow:  Room air CC: room 4   Pt here today for complete physical.  She states that she still has a persistent dry cough even after changing blood pressure medicaitons.   CC:  room 4   Pt here today for complete physical.  She states that she still has a persistent dry cough even after changing blood pressure medicaitons..  History of Present Illness: Michele [name] is a 40 year old female who presents today for a complete physical.    Anxiety-  Last visit she was started on Fluoxetine.  She reports 1-2 episodes a week she develops anxiety attack, this is less frequent since starting fluoxetine.  HTN-  ACE was discontinued due to cough and patient was started on Norvasc.  She continues to have a dry hacking cough which is bothersome at work and is keeping her up at night.  Cough is not exacerbated by laying flat.  Denies associated sinus drainage.  Denies history of asthma but her son has Asthma.    Preventative-  Pap completed 2/11 and was normal- goes to [facility].  Needs mammogram.  + exercise 30 minutes/day- walking.  Diet is not healthy- eats fast food.  Reports 30 lb weight gain in last 6 months- attributes to less exercise/poor eating habits and caring for 1 yr old grand-daughter.  Problems Prior to Update: 1)  Cough  (ICD-786.2) 2)  Hypertension  (ICD-401.9) 3)  Preventive Health Care  (ICD-V70.0) 4)  Chest Pain, Atypical, Hx of  (ICD-V15.89) 5)  Anxiety  (ICD-300.00)  Medications Prior to Update: 1)  Ferrous Sulfate 325 (65 Fe) Mg Tabs (Ferrous Sulfate) .... Take 1  Tablet By Mouth Once A Day 2)  Norvasc 5 Mg Tabs (Amlodipine Besylate) .... One Tablet By Mouth Daily 3)  Hydrochlorothiazide 25 Mg Tabs (Hydrochlorothiazide) .... Take 1 Tablet By Mouth Once A Day 4)  Fluoxetine Hcl 10 Mg Caps (Fluoxetine Hcl) .... Take One Cap By Mouth Daily X 1 Week, Then Increase As Tolerated To 2 Caps By Mouth Daily  Allergies (verified): No Known Drug Allergies  Family History: Arthritis--maternal grandmother Colon Cancer--father, deceased Breast Cancer--mother Stroke-- maternal grandfather HTN--mother, maternal grandmother  Mom- Breast CA age 2,  HTN, living Dad-deceased CA colon- age 77. 2 brothers- one brother handicapped due to complications from spinal meningitis/stroke/seizures. Other brother is healthy  2 sons (22 and 3)- youngest son with asthma/hives? etiology 43 year old grand-daughter- healthy  Social History: Alcohol use-yes (once a month) Regular exercise-yes Never Smoked Denies hx of drug use Married x 20 years  Review of Systems       Constitutional: Denies Fever ENT:  Denies nasal congestion or sore throat. Resp: + cough, see HPI CV:  Denies Chest Pain GI:  Denies nausea or vomitting, diarrhea GU: Denies dysuria Lymphatic: Denies lymphadenopathy Musculoskeletal:  Denies muscle/joint pain Skin:  Denies Rashes Psychiatric: Denies depression, +  anxiety see HPI Neuro: Denies numbness     Physical Exam  General:  Well-developed,well-nourished,in no acute distress; alert,appropriate and cooperative throughout examination Head:  Normocephalic and atraumatic without obvious abnormalities. No apparent alopecia or balding. Eyes:  PERRLA Ears:  External ear exam shows no significant lesions or deformities.  Otoscopic examination reveals clear canals, tympanic membranes are intact bilaterally without bulging, retraction, inflammation or discharge. Hearing is grossly normal bilaterally. Mouth:  Oral mucosa and oropharynx without lesions  or exudates.  Teeth in good repair. Neck:  No deformities, masses, or tenderness noted. Breasts:  deferred- done by GYN Lungs:  Normal respiratory effort, chest expands symmetrically. Lungs are clear to auscultation, no crackles or wheezes. Heart:  Normal rate and regular rhythm. S1 and S2 normal without gallop, murmur, click, rub or other extra sounds. Abdomen:  Bowel sounds positive,abdomen soft and non-tender without masses, organomegaly or hernias noted. Genitalia:  deferred to GYN Msk:  No deformity or scoliosis noted of thoracic or lumbar spine.   Extremities:  No clubbing, cyanosis, edema, or deformity noted with normal full range of motion of all joints.   Neurologic:  No cranial nerve deficits noted. Station and gait are normal. Plantar reflexes are down-going bilaterally. DTRs are symmetrical throughout. Sensory, motor and coordinative functions appear intact. Skin:  Intact without suspicious lesions or rashes Psych:  Cognition and judgment appear intact. Alert and cooperative with normal attention span and concentration. No apparent delusions, illusions, hallucinations   Impression & Recommendations:  Problem # 1:  Preventive Health Care (ICD-V70.0) Assessment Comment Only Immunizations reviewed.  Patient is due for Tetanus.  Patient counselled on diet, exercise, weight loss. Pap is up to date.  Refer for screening mammo.  Will also refer for colonoscopy due to family hx (dad died at 3 from colon cancer).  Baseline EKG and fasting labs today.   Orders: Mammogram (Screening) (Mammo) Gastroenterology Referral (GI) T-Comprehensive Metabolic Panel [phone]) T-Lipid Profile [phone]) T-CBC w/Diff [phone]) T-TSH [phone])  Problem # 2:  HYPERTENSION (ICD-401.9) Assessment: Unchanged BP is reasonable,  DBP a bit high.  Will plan to recheck in 1 month. If DBP remains elevated will increase meds.   Her updated medication list for this problem includes:    Norvasc 5  Mg Tabs (Amlodipine besylate) ..... One tablet by mouth daily    Hydrochlorothiazide 25 Mg Tabs (Hydrochlorothiazide) .Marland Kitchen... Take 1 tablet by mouth once a day  BP today: 124/90 Prior BP: 122/84 (11/17/2009)  Problem # 3:  ANXIETY (ICD-300.00) Assessment: Improved Anxiety is improving, will plan to switch to citalopram due increased risk of drug interactions with fluoxetine The following medications were removed from the medication list:    Fluoxetine Hcl 10 Mg Caps (Fluoxetine hcl) .Marland Kitchen... Take one cap by mouth daily x 1 week, then increase as tolerated to 2 caps by mouth daily Her updated medication list for this problem includes:    Citalopram Hydrobromide 20 Mg Tabs (Citalopram hydrobromide) ..... One tab by mouth daily  Problem # 4:  COUGH (ICD-786.2) Assessment: Deteriorated Son with ashthma, patient notes worsening cough/SOB with exercise.  Will give trial of Advair for suspected asthma.    Complete Medication List: 1)  Ferrous Sulfate 325 (65 Fe) Mg Tabs (Ferrous sulfate) .... Take 1 tablet by mouth every other day 2)  Norvasc 5 Mg Tabs (Amlodipine besylate) .... One tablet by mouth daily 3)  Hydrochlorothiazide 25 Mg Tabs (Hydrochlorothiazide) .... Take 1 tablet by mouth once a day 4)  Citalopram Hydrobromide 20 Mg Tabs (  Citalopram hydrobromide) .... One tab by mouth daily 5)  Advair Diskus 250-50 Mcg/dose Aepb (Fluticasone-salmeterol) .... One puff twice daily  Patient Instructions: 1)  Please follow up in 1 month. 2)  Complete fasting labs and mammogram on the first floor. 3)  You will be called about your referral to GI. 4)  Limit your Sodium (Salt). 5)  Try to incorporated more healthy fruits/veggies into your diet and avoid fast food.   Prescriptions: HYDROCHLOROTHIAZIDE 25 MG TABS (HYDROCHLOROTHIAZIDE) Take 1 tablet by mouth once a day  #30 x 2   Entered and Authorized by:   Lemont Fillers FNP   Signed by:   Lemont Fillers FNP on 12/22/2009   Method used:    Electronically to        [facility] # [phone]* (retail)       [address]       [place], [place]  [address]       Ph: [phone]       Fax: [phone]   RxID:   [id number] NORVASC 5 MG TABS (AMLODIPINE BESYLATE) one tablet by mouth daily  #30 x 2   Entered and Authorized by:   Lemont Fillers FNP   Signed by:   Lemont Fillers FNP on 12/22/2009   Method used:   Electronically to        [facility] # [phone]* (retail)       [address]       [place], [place]  [address]       Ph: [phone]       Fax: [phone]   RxID:   [id number] ADVAIR DISKUS 250-50 MCG/DOSE AEPB (FLUTICASONE-SALMETEROL) one puff twice daily  #1 x 0   Entered and Authorized by:   Lemont Fillers FNP   Signed by:   Lemont Fillers FNP on 12/22/2009   Method used:   Electronically to        [facility] # [phone]* (retail)       [address]       [place], [place]  [address]       Ph: [phone]       Fax: [phone]   RxID:   [id number] CITALOPRAM HYDROBROMIDE 20 MG TABS (CITALOPRAM HYDROBROMIDE) one tab by mouth daily  #30 x 2   Entered and Authorized by:   Lemont Fillers FNP   Signed by:   Lemont Fillers FNP on 12/22/2009   Method used:   Electronically to        [facility] # [phone]* (retail)       [address]       [place], [place]  [address]       Ph: [phone]       Fax: [phone]   RxID:   [id number]   Current Allergies (reviewed today): No known allergies

## 2010-10-11 NOTE — Progress Notes (Signed)
Summary: CX Colon  Phone Note Call from Patient Call back at Home Phone [phone]   Caller: Patient Call For: Dr. Juanda Chance Reason for Call: Talk to Nurse Details for Reason: CX appt. Summary of Call: Pt. called at 9 a.m. stating she had to cancel her colonoscopy for this afternoon at 4 p.m.  She stated her blood pressure "was out the roof" and she was going to her PCP to see if she could get it under control.  Pt. rescheduled procedure for 12/6. Initial call taken by: Schuyler Amor,  August 08, 2010 9:10 AM  Follow-up for Phone Call        OK Follow-up by: Hart Carwin MD,  August 08, 2010 12:36 PM

## 2010-10-11 NOTE — Letter (Signed)
   [place] [facility] [address] [place], [place]  [address]  [place] Phone: [phone]      Jan 18, 2010   [facility] [place] [address] [place], [place] [address]  RE:  LAB RESULTS  Dear  [name],  The following is an interpretation of your most recent lab tests.  Please take note of any instructions provided or changes to medications that have resulted from your lab work.   Your CBC is up to 10.9.  The iron is helping!   Sincerely Yours,    Lemont Fillers FNP

## 2010-10-11 NOTE — Letter (Signed)
Summary: Return to Work Form/[employer]  Return to Work Form/[employer]   Imported By: Lanelle Bal 08/10/2010 11:37:24  _____________________________________________________________________  External Attachment:    Type:   Image     Comment:   External Document

## 2010-10-11 NOTE — Letter (Signed)
   [place] at [facility] [address] [place], [place]  [address]  [place] Phone: [phone]      May 03, 2010   [facility] [place] [address] [place], [place] [address]  RE:  LAB RESULTS  Dear  [name],  The following is an interpretation of your most recent lab tests.  Please take note of any instructions provided or changes to medications that have resulted from your lab work.  ELECTROLYTES:  Good - no changes needed  KIDNEY FUNCTION TESTS:  Good - no changes needed    DIABETIC STUDIES:  Good - no changes needed Blood Glucose: 98    CBC:  Good - no changes needed   Sincerely Yours,    Lemont Fillers FNP  Appended Document:  Mailed.

## 2010-10-11 NOTE — Progress Notes (Signed)
Summary: elevated BP  Phone Note Call from Patient   Caller: Patient Summary of Call: Pt came into the office at 2:45pm stating she had just been evaluated in the ER for elevated BP & heart rate. Pt states that she didn't feel like her BP issues were addressed. She felt more emphasis was put on her heavy menstrual cycle.  States that she had an EKG and CXR but did not know the results. Wanted to see Betzabeth Derringer. I advised pt that Efraim Kaufmann was out of the office today but Dr [name] was here and could see her.  Pt wanted her BP checked. BP was 120/98 and pt stated that she had not had her BP med yet as the ER told her to hold off on taking it. Pt took BP med while in office but declined to see Dr [name] today and made appt with Luz Mares in the morning.  I spoke to Denny Peon (Press photographer) in the ER and she reported that pt's cardiac workup was negative and BP was normal in the ER. She will fax info to [name].  [name] CMA Duncan Dull)  May 26, 2010 3:06 PM

## 2010-10-11 NOTE — Assessment & Plan Note (Signed)
Summary: 1 month fu/dt   Vital Signs:  Patient profile:   40 year old female Height:      65.25 inches Weight:      178.25 pounds BMI:     29.54 O2 Sat:      100 % on Room air Temp:     98.6 degrees F oral Pulse rate:   87 / minute Pulse rhythm:   regular Resp:     18 per minute BP sitting:   134 / 80  (right arm) Cuff size:   large  Vitals Entered By: Glendell Docker CMA (July 19, 2010 8:01 AM)  O2 Flow:  Room air CC: 1 Month follow up Is Patient Diabetic? No Pain Assessment Patient in pain? no      Comments did not get rx for metoprolol, continues to take Hctz-wanted to try diet and exercise first, flu vaccine today   Primary Care Provider:  Lemont Fillers FNP  CC:  1 Month follow up.  History of Present Illness: [name] is a 40 year old female who presents today for follow up.  1) Palpitations-  Patient tells me that she completed her echo cardiogram yesterday.  She was very anxious prior to the procedure and took two ativan.  She was very sleep after this procedure and went home to bed- slept until 2PM and was unable to attend work.  Notes overall that her palpitations are less frequent and less severe.  Most recently if she does develop palpitations they occur early in the AM, or at work. She tells me that she has not started the metoprolol which was prescribed by Dr. Excell Seltzer due to fear of lowering her blood pressure too much.   2) Anxiety-  Overall her anxiety has improved.  She has returned to work which she reports is going well.  Reports that she is currently using ativan about twice a day. Her 72 year old grand-daughter whom she has care for since birth, has moved in with her father.   She has more time now and has started a regular exercise routine.  She does miss her granddaughter and worries about her however.    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 Lungs are clear to auscultation, no crackles or wheezes. Heart:  Normal rate and regular rhythm. S1 and S2 normal without gallop, murmur, click, rub or other extra sounds. Psych:  Cognition and judgment appear intact. Alert and cooperative with normal attention span and concentration. No apparent delusions, illusions, hallucinations   Impression & Recommendations:  Problem # 1:  TACHYCARDIA (ICD-785) Assessment Improved Clinically improved.  Patient has not started metoprolol.  This seems reasonable at this point as her symptoms are better.  I did recommend to patient that if  her symptoms worsen, that she should start metoprolol and stop HCTZ and schedule a f/u apt 2 weeks after initiating med for BP check.   She verbalizes understanding.  Problem # 2:  ANXIETY (ICD-300.00) Assessment: Improved Improved, continue current meds.   15 minutes were spent with patient.  Greater than 50% of this time was spent counseling pt on her anxiety.   Her updated medication list for this problem includes:    Ativan 0.5  Mg Tabs (Lorazepam) ..... One tablet by mouth three times a day as needed    Venlafaxine Hcl 37.5 Mg Tabs (Venlafaxine hcl) .Marland Kitchen... Take two tablets by mouth two times a day  Complete Medication List: 1)  Norvasc 5 Mg Tabs (Amlodipine besylate) .... One and one half (7.5mg ) by mouth daily 2)  Ativan 0.5 Mg Tabs (Lorazepam) .... One tablet by mouth three times a day as needed 3)  Venlafaxine Hcl 37.5 Mg Tabs (Venlafaxine hcl) .... Take two tablets by mouth two times a day 4)  Hydrochlorothiazide 25 Mg Tabs (Hydrochlorothiazide) .... Take 1 tablet by mouth once a day  Patient Instructions: 1)  Keep up the good work with healthy eating and exercise! 2)  Please follow up in 3 months, sooner if problems or concerns. Prescriptions: ATIVAN 0.5 MG TABS (LORAZEPAM) one tablet by mouth three times a day as needed  #60 x 0   Entered and Authorized by:   Lemont Fillers FNP   Signed by:   Lemont Fillers FNP on 07/19/2010   Method used:   Print then Give to Patient   RxID:   [id number] NORVASC 5 MG TABS (AMLODIPINE BESYLATE) one and one half (7.5mg ) by mouth daily  #45 x 3   Entered and Authorized by:   Lemont Fillers FNP   Signed by:   Lemont Fillers FNP on 07/19/2010   Method used:   Electronically to        [facility] # [phone]* (retail)       [address]       [place], [place]  [address]       Ph: [phone]       Fax: [phone]   RxID:   [id number] HYDROCHLOROTHIAZIDE 25 MG TABS (HYDROCHLOROTHIAZIDE) Take 1 tablet by mouth once a day  #30 x 3   Entered and Authorized by:   Lemont Fillers FNP   Signed by:   Lemont Fillers FNP on 07/19/2010   Method used:   Electronically to        [facility] # [phone]* (retail)       [address]       [place], [place]  [address]       Ph: [phone]       Fax: [phone]   RxID:   [id number] VENLAFAXINE HCL 37.5 MG TABS (VENLAFAXINE HCL)  take two tablets by mouth two times a day  #60 x 3   Entered and Authorized by:   Lemont Fillers FNP   Signed by:   Lemont Fillers FNP on 07/19/2010   Method used:   Electronically to        [facility] # [phone]* (retail)       [address]       [place]  [place], [place]  [address]       Ph: [phone]       Fax: [phone]   RxID:   [id number]    Orders Added: 1)  Est. Patient Level III [84[address]]       Current Allergies (reviewed today): No known allergies   Appended Document: 1 month fu/dt Flu Vaccine Consent Questions     Do you have a history of severe allergic reactions to this vaccine? no    Any prior history of allergic reactions to egg and/or gelatin? no    Do you have a sensitivity to the preservative Thimersol? no    Do you have a past history of Guillan-Barre Syndrome? no    Do you currently have an acute febrile illness? no    Have you ever had a severe reaction to latex? no    Vaccine information given and explained to patient? yes    Are you currently pregnant? no    Lot Number:[id number]   Exp Date:03/11/2011   Site Given  Left Deltoid IM.  [name] CMA Duncan Dull)  July 19, 2010 10:02 AM    Clinical Lists Changes  Orders: Added new Service order of Admin 1st Vaccine (44010) - Signed Added new Service order of Flu Vaccine 38yrs + [phone]) - Signed Observations: Added new observation of FLU VAX VIS: 04/05/10 version (07/19/2010 10:01) Added new observation of FLU VAXLOT: [id number] (07/19/2010 10:01) Added new observation of FLU VAXMFR: [employer] (07/19/2010 10:01) Added new observation of FLU VAX EXP: 03/11/2011 (07/19/2010 10:01) Added new observation of FLU VAX DSE: 0.50ml (07/19/2010 10:01) Added new observation of FLU VAX: Fluvax 3+ (07/19/2010 10:01)

## 2010-10-11 NOTE — Assessment & Plan Note (Signed)
Summary: [facility]   Visit Type:  Initial Consult Primary Provider:  Lemont Fillers FNP  CC:  palpitations.  History of Present Illness: This is a 40 year-old woman who presented for an echo today for evaluation of palps and atypical chest pain. When she was having the echo done, her heart rate was markedly elevated in the 150-180 range. The patient described chest pain and dyspnea when this occurred. She was brought back from the echo lab for further evaluation. By the time we were able to do an EKG, HR was in the 130 range showing sinus tachycardia.  She has had episodic palpitations of abrupt onset over the past few months, most commonly waking her up from sleep. She denies exertional chest pain or dyspnea, and other than these periodic spells, she feels well. She denies excessive caffeine intake or alcohol use.   Current Medications (verified): 1)  Ferrous Sulfate 325 (65 Fe) Mg Tabs (Ferrous Sulfate) .... Take 1 Tablet By Mouth Three Times A Day 2)  Norvasc 5 Mg Tabs (Amlodipine Besylate) .... One Tablet By Mouth Daily 3)  Hydrochlorothiazide 25 Mg Tabs (Hydrochlorothiazide) .... Take 1 Tablet By Mouth Once A Day 4)  Citalopram Hydrobromide 20 Mg Tabs (Citalopram Hydrobromide) .... One Tab By Mouth Daily 5)  Advair Diskus 250-50 Mcg/dose Aepb (Fluticasone-Salmeterol) .... One Puff Twice Daily  Allergies (verified): No Known Drug Allergies  Past History:  Past medical, surgical, family and social histories (including risk factors) reviewed, and no changes noted (except as noted below).  Past Medical History: Reviewed history from 11/17/2009 and no changes required. 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 Appendectomy-- 03/2009 Tubal Ligation--1997 Tubal Anastomosis--2005  Family History: Reviewed history from 12/22/2009 and no changes  required. Arthritis--maternal grandmother Colon Cancer--father, deceased Breast Cancer--mother Stroke-- maternal grandfather HTN--mother, maternal grandmother  Mom- Breast CA age 48,  HTN, living Dad-deceased CA colon- age 23. 2 brothers- one brother handicapped due to complications from spinal meningitis/stroke/seizures. Other brother is healthy  2 sons (35 and 40)- youngest son with asthma/hives? etiology 43 year old grand-daughter- healthy  Social History: Reviewed history from 12/22/2009 and no changes required. Alcohol use-yes (once a month) Regular exercise-yes Never Smoked Denies hx of drug use Married x 20 years  Review of Systems       Negative except as per HPI   Vital Signs:  Patient profile:   40 year old female Pulse rate:   132 / minute Resp:     16 per minute BP sitting:   160 / 100  (left arm) Cuff size:   regular  Vitals Entered By: Julieta Gutting, RN, BSN (December 28, 2009 5:28 PM)  Physical Exam  General:  Pt is alert and oriented, healthy-appearing young woman, in no acute distress. HEENT: normal Neck: normal carotid upstrokes without bruits, JVP normal Lungs: CTA CV: tachycardic and regular without murmur or gallop Abd: soft, NT, positive BS, no bruit, no organomegaly Ext: no clubbing, cyanosis, or edema. peripheral pulses 2+ and equal Skin: warm and dry without rash    EKG  Procedure date:  12/28/2009  Findings:      Sinus tachycardia, HR 132 bpm, nonspecific ST-T abnormality.  Impression & Recommendations:  Problem # 1:  UNSPECIFIED TACHYCARDIA (ICD-785.0) Question etiology of her tachycardic episodes...differential dx includes sinus tach vs pSVT. HR is slowing and approaching normal rate as the office visit goes on, suggests sinus tach  rather than SVT which would have abrupt termination. Will check an event recorder to rule out SVT since she had very high heart rates recorded today and symptoms suggest this diagnosis. Will reattempt an  echocardiogram to further evaluate for LV dysfunction or structural heart disease.    Will f/u with the patient once this eval is completed. Other rare possibilties include catecholamine surges from pheochromocytoma, etc, but will only explore this if other studies are unrevealing and symptoms persist.   Orders: EKG w/ Interpretation (93000) Event (Event) Echocardiogram (Echo)  Patient Instructions: 1)  Your physician has requested that you have an echocardiogram.  Echocardiography is a painless test that uses sound waves to create images of your heart. It provides your doctor with information about the size and shape of your heart and how well your heart's chambers and valves are working.  This procedure takes approximately one hour. There are no restrictions for this procedure. 2)  Your physician has recommended that you wear an event monitor.  Event monitors are medical devices that record the heart's electrical activity. Doctors most often use these monitors to diagnose arrhythmias. Arrhythmias are problems with the speed or rhythm of the heartbeat. The monitor is a small, portable device. You can wear one while you do your normal daily activities. This is usually used to diagnose what is causing palpitations/syncope (passing out). 3)  Your physician recommends that you schedule a follow-up appointment as needed.   Appended Document: [facility] Tried to reach pt by telephone to discuss whether she is still having symptoms. Insurance denied event recorder and I wanted to see whether to pursue this further. Tried both home and work numbers but unable to get through on either number.  Appended Document: [facility] Letter mailed to the patient.  Appended Document:  Cardiology     CXR  Procedure date:  05/26/2010  Findings:        Clinical Data: Chest pain, shortness of breath.    CHEST - 2 VIEW    Comparison: 11/13/2009    Findings: Heart and mediastinal  contours are within normal limits.   No focal opacities or effusions.  No acute bony abnormality.    IMPRESSION:   No active disease.    Read By:  Charlett Nose,  M.D.   Released By:  Charlett Nose,  M.D.

## 2010-10-11 NOTE — Assessment & Plan Note (Signed)
Summary: FOLLOW UP FROM ED/[facility]--Rm 4   Vital Signs:  Patient profile:   40 year old female Height:      65.25 inches Weight:      176 pounds BMI:     29.17 Temp:     97.7 degrees F oral Pulse rate:   90 / minute Pulse rhythm:   regular Resp:     16 per minute BP sitting:   128 / 80  (right arm) Cuff size:   large  Vitals Entered By: Mervin Kung CMA [facility]) (May 27, 2010 8:50 AM) CC: Rm 4  Follow up from ER. Elevated BP and heart rate episode. Comments Chest feels sore and tight. Pt agrees all med doses and directions correct.  [name] CMA [facility])  May 27, 2010 8:53 AM    Primary Care Provider:  Lemont Fillers FNP  CC:  Rm 4  Follow up from ER. Elevated BP and heart rate episode.Marland Kitchen  History of Present Illness: [name] is a 40 year old female who presents today [facility].  Pt  reports that she had had a heavy menstrual cycle this week and as a result   stayed home from work on Wednesday.  Yesterday she returned to work and developed chest pain, light headedness and palpitations.  She was first checked by a team at work and found to have a blood pressure of 169/120.   She was then evaluated by the Fire dept who arrived before EMS.  The Fire department reportedly checked her  oxygen and found it to be 93- she was placed on O2.  In addition, she was found to have a  sugar of  116, apparently BP was 210/?Marland Kitchen   She was brought to the ED by EMS where her initial BP was found to be 156/90.  Heart rate in ED was noted to be in the low 100's.  Blood work was normal except for finding of normocytic anemia with Hgb of 10.7.  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 Lungs are clear to auscultation, no crackles or wheezes. Heart:  Normal rate and regular rhythm. S1 and S2 normal without gallop, murmur, click, rub or other extra sounds. Psych:  Oriented X3 and memory intact for recent and remote.  Mildly anxious appearing female   Impression & Recommendations:  Problem # 1:  ANEMIA, IRON DEFICIENCY, MICROCYTIC (ICD-280.8) Assessment Deteriorated Patient's hgb is 10.7 which is acutely lower than her previous reading of 12.2 in August.  This is likely due to menstrual loss per pt history.  Pt is following with Dr. Arlan Organ for IV iron infusions.  Next folllow is scheduled for November.  Will check Iron level today.    Problem # 2:  HYPERTENSION (ICD-401.9) Assessment: Comment Only Labile BP.  I again reminded patient to complete the 24 hour urine- need to rule  out pheochromocytoma as cause for intermittent high BP/palpitations.  I also recommended that she be placed on a Beta Blocker- but she refuses  this at this time due to fear of side effects.  It was recommended to the patient the she undergo holter monitor- however, she tells me that her insurance declined to pay for it.   Her updated medication list for this problem includes:    Norvasc 5 Mg Tabs (Amlodipine besylate) ..... One and one half (7.5mg ) by mouth daily    Hydrochlorothiazide 25  Mg Tabs (Hydrochlorothiazide) .Marland Kitchen... Take 1 tablet by mouth once a day  Problem # 3:  PALPITATIONS, RECURRENT (ICD-785.1) Assessment: Deteriorated EKG was normal yesterday in the ED- should have Holter Monitor arranged.  Will call insurer.  Palpitations may be due to acute blood loss anemia as well.    Problem # 4:  ANXIETY (ICD-300.00) Assessment: Deteriorated  Patient notes a lot of stress in her life currently. She has been unable to tolerate SSRI's, some improvement with Alprazolam- "but it makes me sleepy."  Recommended to patient that she cut alprazolam in half and use on a as needed basis.  Also recommended that she increase her exercise to help manage her stress and that she establish with a therapist to help her work through some of her anxiety.   Her updated medication list for this problem includes:    Alprazolam 0.5 Mg Tabs (Alprazolam) ..... One tablet by mouth every 8 hours as needed for anxiety  Orders: Psychology Referral (Psychology)  Complete Medication List: 1)  Norvasc 5 Mg Tabs (Amlodipine besylate) .... One and one half (7.5mg ) by mouth daily 2)  Hydrochlorothiazide 25 Mg Tabs (Hydrochlorothiazide) .... Take 1 tablet by mouth once a day 3)  Alprazolam 0.5 Mg Tabs (Alprazolam) .... One tablet by mouth every 8 hours as needed for anxiety  Other Orders: T-Iron ([id number]) Gynecologic Referral (Gyn)  Patient Instructions: 1)  Please reschedule your appointment with GYN. 2)  Complete your blood work downstairs. 3)  You will be contacted about your referral to the Therapist. 4)  Please complete your 24 hour urine and return to lab. 5)  Follow up in 6 weeks- sooner if problems or concerns.  Current Allergies (reviewed today): No known allergies   Prevention & Chronic Care Immunizations   Influenza vaccine: Not documented    Tetanus booster: 12/22/2009: Tdap    Pneumococcal vaccine: Not documented  Other Screening   Pap smear: normal  (10/18/2009)     Mammogram: ASSESSMENT: Negative - BI-RADS 1^MM DIGITAL SCREENING  (12/30/2009)   Smoking status: never  (11/17/2009)  Lipids   Total Cholesterol: 162  (12/22/2009)   LDL: 102  (12/22/2009)   LDL Direct: Not documented   HDL: 47  (12/22/2009)   Triglycerides: 66  (12/22/2009)  Hypertension   Last Blood Pressure: 128 / 80  (05/27/2010)   Serum creatinine: 0.86  (05/02/2010)   Serum potassium 4.7  (05/02/2010)  Self-Management Support :    Hypertension self-management support: Not documented

## 2010-10-11 NOTE — Assessment & Plan Note (Signed)
Summary: MED REFILL /BP CHECK/DK rsch per pt/dt--R m 5   Vital Signs:  Patient profile:   40 year old female Height:      65.25 inches Weight:      175.75 pounds BMI:     29.13 Temp:     98.1 degrees F oral Pulse rate:   84 / minute Pulse rhythm:   regular Resp:     16 per minute BP sitting:   114 / 82  (right arm) Cuff size:   regular  Vitals Entered By: Mervin Kung CMA (AAMA) (May 02, 2010 8:16 AM) CC: Room 5  Follow-up visit, blood pressure. Needs refill on Xanax, pt states it helps her settle down at night. Is Patient Diabetic? No Comments Pt states that Citalopram makes her very sluggish at work, can't focus and she is not taking it every day. Michele [name] CMA [facility])  May 02, 2010 8:20 AM    Primary Care Provider:  Lemont Fillers FNP  CC:  Room 5  Follow-up visit, blood pressure. Needs refill on Xanax, and pt states it helps her settle down at night..  History of Present Illness: Michele [name] is a 40 year old female who presents today for follow up.    1)Anxiety- stopped citalopram,  was having trouble focusing on her work.  Notes that she is having issues at night- sometimes wakes up suddenly in a panic- she attributes this to her fear of death. She finds that if she takes 1/2 of a xanax at bedtime, she is able to sleep throught the night.  2) Palpitations-  occasional daytime palpitations.  This happens more often if she becomes agitated.  She saw cardiology who recommended holter, however her insurance will cover this so she has not completed.  She did not complete 24 hr urine for catecholamines/metanephrines.  3)Anemia-  has had 2 rounds of iv iron with Dr. Myna Hidalgo.  She is scheduled to see Dr. Lina Sar.  Notes that her energy level is "pretty low."  Menstrual cycles have been irregular but longer.    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 Lungs are clear to auscultation, no crackles or wheezes. Heart:  Normal rate and regular rhythm. S1 and S2 normal without gallop, murmur, click, rub or other extra sounds. Extremities:  No clubbing, cyanosis, edema, or deformity noted with normal full range of motion of all joints.   Psych:  Cognition and judgment appear intact. Alert and cooperative with normal attention span and concentration. 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 Juanda Chance as schedule for GI eval.  Orders: TLB-CBC Platelet - w/Differential ([id number])  Problem # 2:  PALPITATIONS, RECURRENT (ICD-785.1) Assessment: Unchanged Will check 24 hr urine, TSH normal limit.  May just be due to anxiety. Orders: T-Urine 24 Hr. Metanephrines [phone]) T-Urine 24 Hr. Catecholamines [phone])  Problem # 3:  ANXIETY (ICD-300.00) Assessment: Improved No longer taking citalopram, does note improvement with alprazolam.  Will continue alprazolam on a as needed basis. The following medications were removed from the medication list:  Citalopram Hydrobromide 20 Mg Tabs (Citalopram hydrobromide) ..... One tab by mouth daily Her updated medication list for this problem includes:    Alprazolam 0.5 Mg Tabs (Alprazolam) ..... One tablet by mouth every 8 hours as needed for anxiety  Problem # 4:  HYPERTENSION (ICD-401.9) Assessment: Improved Stable, continue current meds. Her  updated medication list for this problem includes:    Norvasc 5 Mg Tabs (Amlodipine besylate) ..... One and one half (7.5mg ) by mouth daily    Hydrochlorothiazide 25 Mg Tabs (Hydrochlorothiazide) .Marland Kitchen... Take 1 tablet by mouth once a day  Orders: TLB-BMP (Basic Metabolic Panel-BMET) (80048-METABOL)  BP today: 114/82 Prior BP: 122/86 (01/17/2010)  Labs Reviewed: K+: 4.8 (12/22/2009) Creat: : 0.78 (12/22/2009)   Chol: 162 (12/22/2009)   HDL: 47 (12/22/2009)   LDL: 102 (12/22/2009)   TG: 66 (12/22/2009)  Complete Medication List: 1)  Norvasc 5 Mg Tabs (Amlodipine besylate) .... One and one half (7.5mg ) by mouth daily 2)  Hydrochlorothiazide 25 Mg Tabs (Hydrochlorothiazide) .... Take 1 tablet by mouth once a day 3)  Alprazolam 0.5 Mg Tabs (Alprazolam) .... One tablet by mouth every 8 hours as needed for anxiety  Patient Instructions: 1)  Complete your lab work downstairs. 2)  Follow up in 3 months. 3)  Have a nice Fall! Prescriptions: ALPRAZOLAM 0.5 MG TABS (ALPRAZOLAM) one tablet by mouth every 8 hours as needed for anxiety  #30 x 0   Entered and Authorized by:   Lemont Fillers FNP   Signed by:   Lemont Fillers FNP on 05/02/2010   Method used:   Print then Give to Patient   RxID:   [id number]  Written Rx not given to pt. Rx was phoned to [facility] and left on voicemail at pt's request. Mervin Kung CMA [facility])  May 02, 2010 8:50 AM  Current Allergies (reviewed today): No known allergies

## 2010-10-13 NOTE — Assessment & Plan Note (Signed)
Summary: face numb hard time swallowing/mhf--rm 4   Vital Signs:  Patient profile:   40 year old female Height:      65.25 inches Weight:      178 pounds BMI:     29.50 Temp:     97.8 degrees F oral Pulse rate:   78 / minute Pulse rhythm:   regular Resp:     18 per minute BP sitting:   140 / 94  (right arm) Cuff size:   large  Vitals Entered By: Mervin Kung CMA Duncan Dull) (September 27, 2010 10:12 AM) CC: Pt states she has had intermittent difficulty swallowing, dry mouth since Wednesday. Hot flash sensation and right side facial numbness moving up head since Saturday. Is Patient Diabetic? No Pain Assessment Patient in pain? no      Comments Pt was seen in ED yesterday and found to have abcess in tooth on right side of mouth.  Pt states she rarely takes Ativan b/c it causes headache. Pt taking Venlafaxine 1 twice a day. Med list says 2 twice a day. Please confirm directions.   No longer takes Prilosec. [name] CMA Duncan Dull)  September 27, 2010 10:22 AM    Primary Care Provider:  Lemont Fillers FNP  CC:  Pt states she has had intermittent difficulty swallowing and dry mouth since Wednesday. Hot flash sensation and right side facial numbness moving up head since Saturday.Marland Kitchen  History of Present Illness: [name] is a 40 year old female who presents today in follow up from the ED.  She was seen on 1/16 with chief complaint of right sided facial numbness.    1) Facial numnbness-  Reports that her symptoms started on Wednesday 1/11.  At that time symptoms were associated with dysphagia and palpitations.  Facial numbness is not associated with facial weakness. Symptoms are improved somewhat by the use of Ativan.  Symptoms are worsened by nothing.  Notes associated "buzzing feeling" on the side of my face "which goes up into my brain, it feels like 'pop rocks.' "   Happened again on Thursday.  On Friday- still having some difficulty swallowing.  Felt "flushed" on Friday at work.   Friday dental crown fell out.  Satuday night had some alcoholic beverages. Sunday developed some facial swelling, numbness, tingling face/lips.  Felt difficult breathing.  Got some fresh air. Monday morning, felt short of breath.  Went to ED downstairs- had some right hand numbness, chest pain.  Cardiac enzymes, d dimer, head CT were WNL.  Maxillofacial CT did note a dental abscess.  She is scheduled to see the dentist tomorrow.  2) Menorrhagia-  follows with GYN.  She has not recently had any heavy bleeding for several months.  Lasts 3-4 days last several month.  7-8 pads a day.  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 No apparent alopecia  or balding. Mouth:  some erythema noted at base of right inside cheek/gums right side of mouth Lungs:  Normal respiratory effort, chest expands symmetrically. Lungs are clear to auscultation, no crackles or wheezes. Heart:  Normal rate and regular rhythm. S1 and S2 normal without gallop, murmur, click, rub or other extra sounds. Psych:  moderately anxious appearing female, became  tearful at times during the interviewOriented X3, memory intact for recent and remote, and normally interactive.     Impression & Recommendations:  Problem # 1:  DISTURBANCE OF SKIN SENSATION (ICD-782.0) Assessment New Will refer for MRI of the brain to exclude CVA or demyelinating process.  See phone note from 1/17.  My suspicion is that her numbness is  a combination of discomfort from dental abscess and somatization from her anxiety.   Orders: Misc. Referral (Misc. Ref)  Problem # 2:  ANXIETY (ICD-300.00) Assessment: Deteriorated Pt reports that she has had better success with xanax than ativan.  Will place back on xanax.  Will also plan trial of Cymbalta in place of venlafaxine to see if they helps.  25 minutes were spent with the patient today.  Greater than 50% of this time was spent counseling patient on her anxiety. The following medications were removed from the medication list:    Venlafaxine Hcl 37.5 Mg Tabs (Venlafaxine hcl) .Marland Kitchen... Take two tablets by mouth two times a day    Cymbalta 30 Mg Cpep (Duloxetine hcl) ..... One tablet by mouth daily for 1 week.  increase to two tablets by mouth daily on second week Her updated medication list for this problem includes:    Alprazolam 0.5 Mg Tabs (Alprazolam) ..... One tablet by mouth 3 times daily as needed    Cymbalta 60 Mg Cpep (Duloxetine hcl) ..... One tablet by mouth daily  Problem # 3:  ABSCESS, TOOTH (ICD-522.5) Assessment: New Continue antibiotics.  Pt instructed to keep her AM apt with dental.  Problem # 4:  ANEMIA, IRON DEFICIENCY, MICROCYTIC (ICD-280.8) Assessment: Deteriorated Patient's hbg in the ED was 9.9.  Likely due to menorrhagia- she is following with GYN for this.   Patient was receiving iron infusions with Dr. Myna Hidalgo.  I called the office and they told me that she was initiallyt seen in April, then again in June. She no showed september apt.  Spoke with Raiford Noble in the [facility] and requested that they reschedule  patient.  He will call patient to arrange.   Complete Medication List: 1)  Norvasc 5 Mg Tabs (Amlodipine besylate) .... One and one half (7.5mg ) by mouth daily 2)  Alprazolam 0.5 Mg Tabs (Alprazolam) .... One tablet by mouth 3 times daily as needed 3)  Hydrochlorothiazide 25 Mg Tabs (Hydrochlorothiazide) .... Take 1 tablet by mouth once a day 4)  Cymbalta 60 Mg Cpep (Duloxetine hcl) .... One tablet by mouth daily  Patient Instructions: 1)  Please complete your MRI- we will contact you with results. 2)  Keep your upcoming appointment with Dental tomorrow. Prescriptions: CYMBALTA 60 MG CPEP (DULOXETINE HCL) one tablet by mouth daily  #28 x 0   Entered and Authorized by:   Lemont Fillers FNP   Signed by:   Lemont Fillers FNP on 09/27/2010   Method used:   Samples Given   RxID:   [id number] CYMBALTA 30 MG CPEP (DULOXETINE HCL) one tablet by mouth daily for 1 week.  Increase to two tablets by mouth daily on second week  #21 x 0   Entered and Authorized  by:   Lemont Fillers FNP   Signed by:   Lemont Fillers FNP on 09/27/2010   Method used:   Samples Given   RxID:   [id number] ALPRAZOLAM 0.5 MG TABS (ALPRAZOLAM) one tablet by mouth 3 times daily as needed  #60 x 0   Entered and Authorized by:   Lemont Fillers FNP   Signed by:   Lemont Fillers FNP on 09/27/2010   Method used:   Print then Give to Patient   RxID:   [id number]    Orders Added: 1)  Misc. Referral [Misc. Ref] 2)  Est. Patient Level IV [84696]    Current Allergies (reviewed today): No known allergies

## 2010-10-13 NOTE — Progress Notes (Signed)
  Phone Note Outgoing Call   Call placed by: Lemont Fillers FNP,  September 28, 2010 11:14 AM Call placed to: Patient Details for Reason: MRI results Summary of Call: Called cell- no answer, voice mail full.  Home number out of service.  Left voicemail on work phone requesting call back. Initial call taken by: Lemont Fillers FNP,  September 28, 2010 11:16 AM  Follow-up for Phone Call        Patient returned my call. MRI results reviewed.  Will plan referral to neurology. Follow-up by: Lemont Fillers FNP,  September 28, 2010 11:27 AM

## 2010-10-13 NOTE — Letter (Signed)
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 [address]   [place], [place] [address]   Phone: [phone]  Fax: [phone]    September 27, 2010   Employee:  Michele [name]    To Whom It May Concern:   For Medical reasons, please excuse the above named employee from work for the following dates:  Start:   09/27/2010   If you need additional information, please feel free to contact our office.         Sincerely,    Lemont Fillers FNP

## 2010-10-19 ENCOUNTER — Ambulatory Visit: Payer: Self-pay | Admitting: Family

## 2010-10-19 NOTE — Progress Notes (Signed)
Summary: Canceled procedure  Phone Note Call from Patient Call back at [facility] Phone [phone]   Caller: Patient Call For: Dr. Juanda Chance Reason for Call: Talk to Doctor Summary of Call: Pt just called this afternoon to cancel her procedure for tomorrow because she never went to get her prep.Marland Kitchenwould you like to charge? Initial call taken by: [place] Johnson,  September 22, 2010 4:15 PM  Follow-up for Phone Call        Dr Young Berry.Marland KitchenMarland Kitchenpatient also cancelled procedure before on 08/08/10 @ 4 pm on same day (cancelled at 9:02 am 08/08/10) due to "high blood pressure-will see PCP" as well. Dottie Nelson-Smith CMA Duncan Dull)  September 22, 2010 4:39 PM   Additional Follow-up for Phone Call Additional follow up Details #1::        please charge cancellation fee. DB Additional Follow-up by: Hart Carwin MD,  September 22, 2010 9:23 PM    Additional Follow-up for Phone Call Additional follow up Details #2::    Patient BILLED Procedure Cx Fee$100. Follow-up by: Leanor Kail [facility],  October 11, 2010 5:06 PM

## 2010-10-21 ENCOUNTER — Ambulatory Visit (INDEPENDENT_AMBULATORY_CARE_PROVIDER_SITE_OTHER): Payer: BC Managed Care – PPO | Admitting: Family

## 2010-10-21 ENCOUNTER — Encounter: Payer: Self-pay | Admitting: Family

## 2010-10-21 DIAGNOSIS — N920 Excessive and frequent menstruation with regular cycle: Secondary | ICD-10-CM

## 2010-10-21 DIAGNOSIS — F411 Generalized anxiety disorder: Secondary | ICD-10-CM

## 2010-10-21 DIAGNOSIS — J40 Bronchitis, not specified as acute or chronic: Secondary | ICD-10-CM | POA: Insufficient documentation

## 2010-10-27 NOTE — Assessment & Plan Note (Signed)
Summary: 3 month fu/ss--rm 4   Vital Signs:  Patient profile:   40 year old female Height:      65.25 inches Weight:      179 pounds BMI:     29.67 Temp:     97.8 degrees F oral Pulse rate:   78 / minute Pulse rhythm:   regular Resp:     16 per minute BP sitting:   110 / 82  (right arm) Cuff size:   large  Vitals Entered By: Mervin Kung CMA (AAMA) (October 21, 2010 8:30 AM) CC: Pt here for 3 month follow up of hypertension and anxiety. Notes that she has had a dry cough x 1 week. Is Patient Diabetic? No Comments Pt agrees all med doses and directions are correct. [name] CMA Duncan Dull)  October 21, 2010 8:37 AM    Primary Care Provider:  Lemont Fillers FNP  CC:  Pt here for 3 month follow up of hypertension and anxiety. Notes that she has had a dry cough x 1 week.Marland Kitchen  History of Present Illness: [name] is a 40 year old female who presents today with cc of cough.  1)Cough- started 1 week ago.  Notes 1 week history of post-nasal drip.   Cough has been dry and hacking. Had a fever on Monday and Tuesday about 100.  + Sore throat from coughing.  + Headache.  2) Anxiety-  feels "ok."  Overall improved.  Last week she had a "double dose of iron" form Dr. Tama Gander office.    3) Mennorhagia- sees Debbora Dus NP at [facility].  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 No apparent alopecia or balding. Eyes:  No corneal or conjunctival inflammation noted. EOMI. Perrla. Funduscopic exam benign, without hemorrhages, exudates or papilledema. Vision grossly normal. Ears:  External ear exam shows no significant lesions or deformities.  Otoscopic examination reveals clear canals, tympanic membranes are intact bilaterally without bulging, retraction, inflammation or discharge. Hearing is grossly normal bilaterally. Mouth:  Oral mucosa and oropharynx without lesions or exudates.  Teeth in good repair. Lungs:  Normal respiratory effort, chest expands symmetrically. Lungs are clear to auscultation, no crackles or wheezes. Heart:  Normal rate and regular rhythm. S1 and S2 normal without gallop, murmur, click, rub or other extra sounds. Extremities:  No peripheral edema is noted.    Impression & Recommendations:  Problem # 1:  BRONCHITIS (ICD-490) Assessment New Will plan to treat patient with zithromax and tessalon.  Add nasonex to help with post-nasal drip.  Pt to call if symptoms worsen or if they do not improve.  sample provided of nasonex  1 maa 44 nov 2013#2   Her updated medication list for this problem includes:    Tessalon Perles 100 Mg Caps (Benzonatate) ..... One cap by mouth three times a day as needed for cough  Zithromax Z-pak 250 Mg Tabs (Azithromycin) .Marland Kitchen... 2 tabs by mouth today, then one tablet daily for 4 additional days  Problem # 2:  MENORRHAGIA (ICD-626.2) Assessment: Unchanged Recommended that she follow up with GYN to discuss treatment for her heavy menstrual cycles.    Problem # 3:  ANXIETY (ICD-300.00) Assessment:  Improved cymbalta 04 2013 [id number] A #21 samples provided.   Her updated medication list for this problem includes:    Alprazolam 0.5 Mg Tabs (Alprazolam) ..... One tablet by mouth 3 times daily as needed    Cymbalta 60 Mg Cpep (Duloxetine hcl) ..... One tablet by mouth daily  Complete Medication List: 1)  Norvasc 5 Mg Tabs (Amlodipine besylate) .... One and one half (7.5mg ) by mouth daily 2)  Alprazolam 0.5 Mg Tabs (Alprazolam) .... One tablet by mouth 3 times daily as needed 3)  Hydrochlorothiazide 25 Mg Tabs (Hydrochlorothiazide) .... Take 1 tablet by mouth once a day 4)  Cymbalta 60 Mg Cpep (Duloxetine hcl) .... One tablet by mouth daily 5)  Tessalon Perles 100 Mg Caps (Benzonatate) .... One cap by mouth three times a day as needed for cough 6)  Nasonex 50 Mcg/act Susp (Mometasone furoate) .... 2 sprays each nostril once daily 7)  Zithromax Z-pak 250 Mg Tabs (Azithromycin) .... 2 tabs by mouth today, then one tablet daily for 4 additional days  Patient Instructions: 1)  Please schedule a follow up apt with Debbora Dus NP to discuss options for your heavy periods.   2)  Call if symptoms worsen, or if you are not improved in 48 to 72 hours. Prescriptions: CYMBALTA 60 MG CPEP (DULOXETINE HCL) one tablet by mouth daily  #30 x 2   Entered and Authorized by:   Lemont Fillers FNP   Signed by:   Lemont Fillers FNP on 10/21/2010   Method used:   Faxed to ...       [facility]  [address] # [phone]* (retail)       [phone] [address]       [place], [place]  [address]       Ph: [phone]       Fax: [phone]   RxID:   [id number] ZITHROMAX Z-PAK 250 MG TABS (AZITHROMYCIN) 2 tabs by mouth today, then one tablet daily for 4 additional days  #1 pack x 0   Entered and Authorized by:   Lemont Fillers FNP   Signed by:   Lemont Fillers FNP on 10/21/2010   Method used:   Faxed to ...       [facility]  [address] # [phone]* (retail)       [phone] [address]        [place], [place]  [address]       Ph: [phone]       Fax: [phone]   RxID:   [id number] NASONEX 50 MCG/ACT SUSP (MOMETASONE FUROATE) 2 sprays each nostril once daily  #2 x 0   Entered and Authorized by:   Lemont Fillers FNP   Signed by:   Lemont Fillers FNP on 10/21/2010   Method used:   Samples Given   RxID:   [id number] TESSALON PERLES 100 MG CAPS (BENZONATATE) one cap by mouth three times a day as needed for cough  #30 x 0   Entered and Authorized by:   Lemont Fillers FNP  Signed by:   Lemont Fillers FNP on 10/21/2010   Method used:   Electronically to        [facility] # [phone]* (retail)       [address]       [place], [place]  [address]       Ph: [phone]       Fax: [phone]   RxID:   [id number]    Orders Added: 1)  Est. Patient Level III [72536]    Current Allergies (reviewed today): No known allergies

## 2010-11-01 ENCOUNTER — Encounter: Payer: Self-pay | Admitting: Family

## 2010-11-01 ENCOUNTER — Ambulatory Visit (INDEPENDENT_AMBULATORY_CARE_PROVIDER_SITE_OTHER): Payer: BC Managed Care – PPO | Admitting: Family

## 2010-11-01 ENCOUNTER — Ambulatory Visit (INDEPENDENT_AMBULATORY_CARE_PROVIDER_SITE_OTHER)
Admission: RE | Admit: 2010-11-01 | Discharge: 2010-11-01 | Disposition: A | Discharge status: Home / Self Care | Payer: BC Managed Care – PPO | Source: Ambulatory Visit | Attending: Internal Medicine | Admitting: Internal Medicine

## 2010-11-01 ENCOUNTER — Other Ambulatory Visit: Payer: Self-pay | Admitting: Internal Medicine

## 2010-11-01 ENCOUNTER — Ambulatory Visit (HOSPITAL_BASED_OUTPATIENT_CLINIC_OR_DEPARTMENT_OTHER)
Admission: RE | Admit: 2010-11-01 | Discharge: 2010-11-01 | Disposition: A | Discharge status: Home / Self Care | Payer: BC Managed Care – PPO | Source: Ambulatory Visit | Attending: Internal Medicine | Admitting: Internal Medicine

## 2010-11-01 ENCOUNTER — Telehealth: Payer: Self-pay | Admitting: Family

## 2010-11-01 DIAGNOSIS — R0602 Shortness of breath: Secondary | ICD-10-CM | POA: Insufficient documentation

## 2010-11-01 DIAGNOSIS — R05 Cough: Secondary | ICD-10-CM

## 2010-11-01 DIAGNOSIS — M79609 Pain in unspecified limb: Secondary | ICD-10-CM

## 2010-11-01 DIAGNOSIS — R059 Cough, unspecified: Secondary | ICD-10-CM

## 2010-11-01 DIAGNOSIS — M7989 Other specified soft tissue disorders: Secondary | ICD-10-CM | POA: Insufficient documentation

## 2010-11-01 DIAGNOSIS — E0789 Other specified disorders of thyroid: Secondary | ICD-10-CM | POA: Insufficient documentation

## 2010-11-01 DIAGNOSIS — M79604 Pain in right leg: Secondary | ICD-10-CM

## 2010-11-01 DIAGNOSIS — Z0189 Encounter for other specified special examinations: Secondary | ICD-10-CM

## 2010-11-01 DIAGNOSIS — E049 Nontoxic goiter, unspecified: Secondary | ICD-10-CM

## 2010-11-01 DIAGNOSIS — E042 Nontoxic multinodular goiter: Secondary | ICD-10-CM

## 2010-11-01 HISTORY — DX: Nontoxic multinodular goiter: E04.2

## 2010-11-01 MED ORDER — IOHEXOL 350 MG/ML SOLN
80.0000 mL | Freq: Once | INTRAVENOUS | Status: AC | PRN
  Administered 2010-11-01: 80 mL via INTRAVENOUS

## 2010-11-03 ENCOUNTER — Other Ambulatory Visit: Payer: Self-pay | Admitting: Internal Medicine

## 2010-11-03 DIAGNOSIS — E079 Disorder of thyroid, unspecified: Secondary | ICD-10-CM

## 2010-11-04 ENCOUNTER — Ambulatory Visit (HOSPITAL_BASED_OUTPATIENT_CLINIC_OR_DEPARTMENT_OTHER)
Admission: RE | Admit: 2010-11-04 | Discharge: 2010-11-04 | Disposition: A | Discharge status: Home / Self Care | Payer: BC Managed Care – PPO | Source: Ambulatory Visit | Attending: Internal Medicine | Admitting: Internal Medicine

## 2010-11-04 DIAGNOSIS — E049 Nontoxic goiter, unspecified: Secondary | ICD-10-CM | POA: Insufficient documentation

## 2010-11-04 DIAGNOSIS — E079 Disorder of thyroid, unspecified: Secondary | ICD-10-CM

## 2010-11-07 ENCOUNTER — Telehealth: Payer: Self-pay | Admitting: Family

## 2010-11-07 LAB — CONVERTED CEMR LAB: TSH: 2.021 microintl units/mL (ref 0.350–4.500)

## 2010-11-08 ENCOUNTER — Encounter: Payer: Self-pay | Admitting: Family

## 2010-11-08 NOTE — Progress Notes (Signed)
Summary: still has cough  Phone Note Call from Patient Call back at Work Phone [phone]   Caller: Patient Call For: NURSE Summary of Call: pt was seen a couple of weeks ago and is still not better. she was wondering if Efraim Kaufmann can call her in something else. please assist. Initial call taken by: Elba Barman,  November 01, 2010 8:56 AM  Follow-up for Phone Call        Pt states she still has dry cough. Denies fever, pain, head congestion or other symptoms. Felt that her cough had improved for about 3 days but returned the same as before.  Please advise. Follow-up by: Mervin Kung CMA Duncan Dull),  November 01, 2010 9:15 AM  Additional Follow-up for Phone Call Additional follow up Details #1::        I would like to see her back in the office please to re-evaluate.   Additional Follow-up by: Lemont Fillers FNP,  November 01, 2010 10:04 AM    Additional Follow-up for Phone Call Additional follow up Details #2::    Advised pt per Kenedee Molesky's instructions and scheduled appointment for today at 3:15. [name] CMA Duncan Dull)  November 01, 2010 11:15 AM

## 2010-11-08 NOTE — Assessment & Plan Note (Signed)
Summary: cough--rm 5   Vital Signs:  Patient profile:   40 year old female Height:      65.25 inches Weight:      184 pounds BMI:     30.49 Temp:     98.0 degrees F rectal Pulse rate:   102 / minute Pulse rhythm:   regular Resp:     18 per minute BP sitting:   132 / 82  (right arm) Cuff size:   large  Vitals Entered By: Mervin Kung CMA Duncan Dull) (November 01, 2010 3:14 PM) CC: Pt states she still has dry cough. No change in symptoms, no improvement. Is Patient Diabetic? No Comments Pt not taking Tessalon as it didn't help her cough, has completed Zpak. [name] CMA Duncan Dull)  November 01, 2010 3:20 PM    Primary Care Provider:  Lemont Fillers FNP  CC:  Pt states she still has dry cough. No change in symptoms and no improvement..  History of Present Illness: Pt is a 40 year old female who presents today for follow up of her bronchitis.    1.  Cough- Chief complaint today is cough.  Cough has been present since 2/1.  She was treated last visit with zithromax with some initial improvement x 1 week.  Notes that last Thursday she started coughing again.  No associated cold symptoms or fever.  No improvement with tessalon.  Has not used nasonex.  Denies history of asthma or seasonal allergies.  Denies associated symptoms of heartburn unless she eats late and then lays down.  Cough is improved by nothing.  Cough is worsend by nothing.  Reports that her cough is not productive. Also notes a 1 month hx of right calf pain. + prior hx of same symptoms.  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 No apparent alopecia or balding. Lungs:  Normal respiratory effort, chest expands symmetrically. Lungs are clear to auscultation, no crackles or wheezes. Heart:  Normal rate and regular rhythm. S1 and S2 normal without gallop, murmur, click, rub or other extra sounds. Extremities:  No calf swelling, no peripheral edem is noted.   Impression & Recommendations:  Problem # 1:  COUGH (ICD-786.2) Assessment Unchanged Spirometry performed today is WNL.  Due to complaint of calf pain and not of mild tachycardia and associated cough, a  CTA chest was performed which was negative for PE.  Recommended that she add PPI and start nasal steroid.  Cheratussin as needed for short term at bedtime.  Recommended delsym during the day.    Orders: Misc. Referral (Misc. Ref) Spirometry w/Graph (94010) Spirometry w/Graph (94010)  Problem # 2:  CALF PAIN, RIGHT (ICD-729.5) Assessment: New RLE doppler neg for DVT.   Orders: Misc. Referral (Misc. Ref)  Problem # 3:  THYROID MASS (ICD-240.9) Assessment: New  incidental note on CT of bilateral thyroid lesions.  Wil order ultrasound to further evaluate.  (radiology results discussed last night by phone with patient).   Orders: Misc. Referral (Misc. Ref)  Complete Medication List: 1)  Norvasc 5 Mg Tabs (Amlodipine besylate) .... One and one half (7.5mg ) by mouth daily 2)  Alprazolam 0.5 Mg Tabs (Alprazolam) .... One tablet by mouth 3 times daily as needed 3)   Hydrochlorothiazide 25 Mg Tabs (Hydrochlorothiazide) .... Take 1 tablet by mouth once a day 4)  Cymbalta 60 Mg Cpep (Duloxetine hcl) .... One tablet by mouth daily 5)  Cheratussin Ac 100-10 Mg/27ml Syrp (Guaifenesin-codeine) .... One teaspoon every 6 hours as needed for cough 6)  Nasonex 50 Mcg/act Susp (Mometasone furoate) .... 2 sprays each nostril once daily 7)  Zithromax Z-pak 250 Mg Tabs (Azithromycin) .... 2 tabs by mouth today, then one tablet daily for 4 additional days 8)  Prilosec 20 Mg Cpdr (Omeprazole) .... One tablet by mouth once daily  Other Orders: CXR- 2view (CXR) Urine Pregnancy Test  ([address])  Patient Instructions: 1)  Please complete your CT and ultrasound on the first floor today. 2)  We will contact you with the results. 3)  Start prilosec OTC. 4)  Do not take cheratussin and xanax together. 5)  You may use Delsym during the day. 6)  Follow up in 1 month. Prescriptions: CHERATUSSIN AC 100-10 MG/5ML SYRP (GUAIFENESIN-CODEINE) one teaspoon every 6 hours as needed for cough  #200 x 0   Entered and Authorized by:   Lemont Fillers FNP   Signed by:   Lemont Fillers FNP on 11/01/2010   Method used:   Print then Give to Patient   RxID:   [id number]    Orders Added: 1)  CXR- 2view [CXR] 2)  Misc. Referral [Misc. Ref] 3)  Spirometry w/Graph [94010] 4)  Urine Pregnancy Test  [810[address]] 5)  Misc. Referral [Misc. Ref] 6)  Misc. Referral [Misc. Ref] 7)  Est. Patient Level IV [95621]    Current Allergies (reviewed today): No known allergies

## 2010-11-14 ENCOUNTER — Encounter: Payer: Self-pay | Admitting: Family

## 2010-11-17 ENCOUNTER — Other Ambulatory Visit: Payer: Self-pay | Admitting: Hematology & Oncology

## 2010-11-17 ENCOUNTER — Encounter (HOSPITAL_BASED_OUTPATIENT_CLINIC_OR_DEPARTMENT_OTHER): Payer: BC Managed Care – PPO | Admitting: Hematology & Oncology

## 2010-11-17 DIAGNOSIS — D509 Iron deficiency anemia, unspecified: Secondary | ICD-10-CM

## 2010-11-17 LAB — CBC WITH DIFFERENTIAL (CANCER CENTER ONLY)
BASO#: 0 10*3/uL (ref 0.0–0.2)
Eosinophils Absolute: 0.1 10*3/uL (ref 0.0–0.5)
HGB: 12 g/dL (ref 11.6–15.9)
LYMPH#: 1.6 10*3/uL (ref 0.9–3.3)
MONO#: 0.5 10*3/uL (ref 0.1–0.9)
NEUT#: 4.8 10*3/uL (ref 1.5–6.5)
RBC: 4.59 10*6/uL (ref 3.70–5.32)
WBC: 7 10*3/uL (ref 3.9–10.0)

## 2010-11-17 LAB — CHCC SATELLITE - SMEAR

## 2010-11-17 LAB — VITAMIN B12: Vitamin B-12: 469 pg/mL (ref 211–911)

## 2010-11-17 LAB — FERRITIN: Ferritin: 72 ng/mL (ref 10–291)

## 2010-11-17 LAB — RETICULOCYTES (CHCC): RBC.: 4.64 MIL/uL (ref 3.87–5.11)

## 2010-11-17 NOTE — Progress Notes (Signed)
  Phone Note Outgoing Call   Summary of Call: Reviewed ultrasound of thyroid.  Notes bilateral nodules. Will refer to endocrinology for further evaluation. Pt aware. Initial call taken by: Lemont Fillers FNP,  November 07, 2010 11:41 AM

## 2010-11-17 NOTE — Letter (Signed)
   [place] [address] [place], [place]  [address]  [place] Phone: [phone]      November 08, 2010   [facility] [place] [address] [place], [place] [address]  RE:  LAB RESULTS  Dear  [name],  The following is an interpretation of your most recent lab tests.  Please take note of any instructions provided or changes to medications that have resulted from your lab work.   THYROID STUDIES:  Thyroid studies normal TSH: 2.021    Your blood work to test your thyroid function is normal.  You will be contacted about your referral to endocrinology.   Sincerely Yours,    Lemont Fillers FNP  Appended Document:  mailed

## 2010-11-24 LAB — BASIC METABOLIC PANEL
BUN: 13 mg/dL (ref 6–23)
Chloride: 105 mEq/L (ref 96–112)
Creatinine, Ser: 0.8 mg/dL (ref 0.4–1.2)
GFR calc Af Amer: >60 mL/min (ref >60)
GFR calc non Af Amer: >60 mL/min (ref >60)

## 2010-11-24 LAB — DIFFERENTIAL
Basophils Absolute: 0.1 10*3/uL (ref 0.0–0.1)
Basophils Relative: 2 % — ABNORMAL HIGH (ref 0–1)
Eosinophils Absolute: 0 10*3/uL (ref 0.0–0.7)
Monocytes Relative: 6 % (ref 3–12)
Neutrophils Relative %: 66 % (ref 43–77)

## 2010-11-24 LAB — URINALYSIS, ROUTINE W REFLEX MICROSCOPIC
Glucose, UA: NEGATIVE mg/dL
Ketones, ur: NEGATIVE mg/dL
Leukocytes, UA: NEGATIVE
pH: 6 (ref 5.0–8.0)

## 2010-11-24 LAB — POCT CARDIAC MARKERS: Myoglobin, poc: 68.5 ng/mL (ref 12–200)

## 2010-11-24 LAB — WET PREP, GENITAL
Clue Cells Wet Prep HPF POC: NONE SEEN
WBC, Wet Prep HPF POC: NONE SEEN

## 2010-11-24 LAB — URINE MICROSCOPIC-ADD ON

## 2010-11-24 LAB — CBC
MCH: 30.9 pg (ref 26.0–34.0)
MCHC: 35.6 g/dL (ref 30.0–36.0)
Platelets: 183 10*3/uL (ref 150–400)
RDW: 12.9 % (ref 11.5–15.5)

## 2010-11-24 LAB — GC/CHLAMYDIA PROBE AMP, GENITAL: GC Probe Amp, Genital: NEGATIVE

## 2010-11-28 ENCOUNTER — Ambulatory Visit: Payer: BC Managed Care – PPO | Admitting: Family

## 2010-11-29 NOTE — Letter (Signed)
Summary: [facility]  [facility]   Imported By: Maryln Gottron 11/21/2010 12:16:08  _____________________________________________________________________  External Attachment:    Type:   Image     Comment:   External Document

## 2010-12-05 ENCOUNTER — Ambulatory Visit: Payer: BC Managed Care – PPO | Admitting: Family

## 2010-12-05 LAB — APTT: aPTT: 34 seconds (ref 24–37)

## 2010-12-05 LAB — CBC
HCT: 39.5 % (ref 36.0–46.0)
Hemoglobin: 12.9 g/dL (ref 12.0–15.0)
MCHC: 32.6 g/dL (ref 30.0–36.0)
MCV: 76.5 fL — ABNORMAL LOW (ref 78.0–100.0)
MCV: 77.8 fL — ABNORMAL LOW (ref 78.0–100.0)
Platelets: 259 10*3/uL (ref 150–400)
Platelets: 283 10*3/uL (ref 150–400)
RBC: 5.15 MIL/uL — ABNORMAL HIGH (ref 3.87–5.11)
RDW: 20.9 % — ABNORMAL HIGH (ref 11.5–15.5)
RDW: 22.4 % — ABNORMAL HIGH (ref 11.5–15.5)
WBC: 7.4 10*3/uL (ref 4.0–10.5)
WBC: 7.8 10*3/uL (ref 4.0–10.5)

## 2010-12-05 LAB — COMPREHENSIVE METABOLIC PANEL
AST: 17 U/L (ref 0–37)
Albumin: 3.7 g/dL (ref 3.5–5.2)
Chloride: 103 mEq/L (ref 96–112)
Creatinine, Ser: 0.85 mg/dL (ref 0.4–1.2)
GFR calc Af Amer: >60 mL/min (ref >60)
Total Bilirubin: 1.2 mg/dL (ref 0.3–1.2)
Total Protein: 7.1 g/dL (ref 6.0–8.3)

## 2010-12-05 LAB — PROTIME-INR
INR: 1.11 (ref 0.00–1.49)
Prothrombin Time: 14.2 seconds (ref 11.6–15.2)

## 2010-12-05 LAB — DIFFERENTIAL
Basophils Absolute: 0 10*3/uL (ref 0.0–0.1)
Basophils Relative: 0 % (ref 0–1)
Eosinophils Relative: 0 % (ref 0–5)
Eosinophils Relative: 1 % (ref 0–5)
Lymphocytes Relative: 24 % (ref 12–46)
Lymphocytes Relative: 28 % (ref 12–46)
Lymphs Abs: 1.9 10*3/uL (ref 0.7–4.0)
Lymphs Abs: 2.1 10*3/uL (ref 0.7–4.0)
Monocytes Absolute: 0.5 10*3/uL (ref 0.1–1.0)
Monocytes Relative: 6 % (ref 3–12)
Monocytes Relative: 9 % (ref 3–12)
Neutro Abs: 4.6 10*3/uL (ref 1.7–7.7)

## 2010-12-05 LAB — PHOSPHORUS: Phosphorus: 4.3 mg/dL (ref 2.3–4.6)

## 2010-12-05 LAB — BASIC METABOLIC PANEL
BUN: 10 mg/dL (ref 6–23)
CO2: 28 mEq/L (ref 19–32)
Calcium: 9.6 mg/dL (ref 8.4–10.5)
Chloride: 103 mEq/L (ref 96–112)
Creatinine, Ser: 0.9 mg/dL (ref 0.4–1.2)
GFR calc Af Amer: >60 mL/min (ref >60)
GFR calc non Af Amer: >60 mL/min (ref >60)
Glucose, Bld: 104 mg/dL — ABNORMAL HIGH (ref 70–99)
Potassium: 3.7 mEq/L (ref 3.5–5.1)
Sodium: 142 mEq/L (ref 135–145)

## 2010-12-05 LAB — URINALYSIS, ROUTINE W REFLEX MICROSCOPIC
Bilirubin Urine: NEGATIVE
Glucose, UA: NEGATIVE mg/dL
Ketones, ur: NEGATIVE mg/dL
Leukocytes, UA: NEGATIVE
pH: 5.5 (ref 5.0–8.0)

## 2010-12-05 LAB — TSH: TSH: 1.884 u[IU]/mL (ref 0.350–4.500)

## 2010-12-05 LAB — CARDIAC PANEL(CRET KIN+CKTOT+MB+TROPI)
Relative Index: 1.1 (ref 0.0–2.5)
Relative Index: 1.3 (ref 0.0–2.5)
Total CK: 144 U/L (ref 7–177)
Total CK: 152 U/L (ref 7–177)
Troponin I: 0.01 ng/mL (ref 0.00–0.06)

## 2010-12-05 LAB — HOMOCYSTEINE: Homocysteine: 7.2 umol/L (ref 4.0–15.4)

## 2010-12-05 LAB — POCT CARDIAC MARKERS
CKMB, poc: 1.6 ng/mL (ref 1.0–8.0)
Myoglobin, poc: 60.6 ng/mL (ref 12–200)
Myoglobin, poc: 63.8 ng/mL (ref 12–200)
Troponin i, poc: <0.05 ng/mL (ref 0.00–0.09)

## 2010-12-05 LAB — LIPID PANEL
Cholesterol: 175 mg/dL (ref 0–200)
LDL Cholesterol: 122 mg/dL — ABNORMAL HIGH (ref 0–99)
Triglycerides: 42 mg/dL (ref <150)

## 2010-12-05 LAB — HEMOGLOBIN A1C: Mean Plasma Glucose: 117 mg/dL

## 2010-12-05 LAB — MAGNESIUM: Magnesium: 2.3 mg/dL (ref 1.5–2.5)

## 2010-12-05 LAB — D-DIMER, QUANTITATIVE: D-Dimer, Quant: <0.22 ug/mL-FEU (ref 0.00–0.48)

## 2010-12-05 LAB — PREGNANCY, URINE: Preg Test, Ur: NEGATIVE

## 2010-12-05 LAB — URINE MICROSCOPIC-ADD ON

## 2010-12-07 ENCOUNTER — Other Ambulatory Visit: Payer: Self-pay | Admitting: Family

## 2010-12-07 DIAGNOSIS — Z1231 Encounter for screening mammogram for malignant neoplasm of breast: Secondary | ICD-10-CM

## 2010-12-13 ENCOUNTER — Ambulatory Visit (INDEPENDENT_AMBULATORY_CARE_PROVIDER_SITE_OTHER): Payer: BC Managed Care – PPO | Admitting: Family

## 2010-12-13 ENCOUNTER — Encounter: Payer: Self-pay | Admitting: Family

## 2010-12-13 DIAGNOSIS — E049 Nontoxic goiter, unspecified: Secondary | ICD-10-CM

## 2010-12-13 DIAGNOSIS — F411 Generalized anxiety disorder: Secondary | ICD-10-CM

## 2010-12-13 DIAGNOSIS — R002 Palpitations: Secondary | ICD-10-CM | POA: Insufficient documentation

## 2010-12-13 MED ORDER — ALPRAZOLAM 0.5 MG PO TABS: 0.5000 mg | ORAL_TABLET | Freq: Three times a day (TID) | ORAL | Status: DC | PRN

## 2010-12-13 MED ORDER — METOPROLOL TARTRATE 25 MG PO TABS: ORAL_TABLET | ORAL | Status: DC

## 2010-12-13 MED ORDER — HYDROCHLOROTHIAZIDE 25 MG PO TABS: 25.0000 mg | ORAL_TABLET | Freq: Every day | ORAL | Status: DC

## 2010-12-13 NOTE — Patient Instructions (Signed)
You will be contacted about your referral to psychiatry.

## 2010-12-13 NOTE — Assessment & Plan Note (Addendum)
Deteriorated.  I recommended to the patient that she see psychiatry for further med adjustment as she feels that Cymbalta makes her sleepy.  She has not had success in the past with effexor, citalopram, or fluoxetine.  Continue Xanax PRN-  I advised her not to become pregnant while on xanax as this could be unsafe for the baby.  Pt verbalizes understanding. 25 minutes spent with patient.  Greater than 50% of this time was spent counseling patient on her anxiety.

## 2010-12-13 NOTE — Assessment & Plan Note (Addendum)
Patient had initial thyroid biopsy.  She reports that one nodule was benign, the other inconclusive.  She is scheduled for an additional biopsy as an inpatient at [facility]. This is being managed by Dr. Allena Katz

## 2010-12-13 NOTE — Progress Notes (Signed)
  Subjective:    Patient ID: MAELEY MATTON, female    DOB: [birth year], 40 y.o.   MRN: [id number]  HPI  Anxiety- notes that she is very tired from the Cymbalta. Anxiety is not improved.  Needing xanax nearly every day.  Complains about weight gain- 10 pounds in the last 1 year.    Thyroid nodule- had thyroid biopsy last month with Dr. Allena Katz.  HTN-  Stopped norvasc due to palpitations and shortness of breath.       Review of Systems  Constitutional: Positive for unexpected weight change.  Cardiovascular: Positive for palpitations.  Psychiatric/Behavioral: Positive for decreased concentration.       Objective:   Physical Exam  Constitutional: She appears well-developed and well-nourished. No distress.  HENT:  Head: Atraumatic.  Eyes: Conjunctivae are normal.  Cardiovascular: Normal rate and regular rhythm.   Pulmonary/Chest: Effort normal and breath sounds normal.  Psychiatric:       Moderately anxious appearing female.  A and O x 3.  Pleasant          Assessment & Plan:

## 2010-12-13 NOTE — Assessment & Plan Note (Signed)
She is agreeable to a low dose beta blocker.  This hopefully will help with her c/o palpitations.

## 2010-12-15 ENCOUNTER — Telehealth: Payer: Self-pay | Admitting: *Deleted

## 2010-12-15 NOTE — Telephone Encounter (Signed)
Patient called and left voice message stating she was having problems with swelling and wanted to know if there was something that she could take over the counter or rx for swelling in her legs and feet, and fluid retention.

## 2010-12-16 ENCOUNTER — Telehealth: Payer: Self-pay | Admitting: Family

## 2010-12-16 MED ORDER — FUROSEMIDE 20 MG PO TABS: 20.0000 mg | ORAL_TABLET | Freq: Every day | ORAL | Status: DC | PRN

## 2010-12-16 NOTE — Telephone Encounter (Signed)
Left message on patient cell- Lasix once daily in place of HCTZ.  F/u in 1 month.

## 2010-12-21 ENCOUNTER — Telehealth: Payer: Self-pay | Admitting: *Deleted

## 2010-12-21 NOTE — Telephone Encounter (Signed)
Pt left voice message requesting a return call re: paperwork that she would like Melissa to complete. Pt requested we call her work number. Attempted to reach pt and left message on work voicemail to return my call.

## 2010-12-22 NOTE — Telephone Encounter (Signed)
Left message on work voicemail to return my call. 

## 2010-12-23 ENCOUNTER — Encounter: Payer: Self-pay | Admitting: *Deleted

## 2010-12-23 ENCOUNTER — Telehealth: Payer: Self-pay | Admitting: Family

## 2010-12-23 NOTE — Telephone Encounter (Signed)
Pt returned my call and states that her employer has asked her to complete FMLA paperwork due to her absences from anxiety attacks. Pt states she has only missed a couple of days recently due to this but has missed several in the past. She has follow up with [name] in June and will drop paperwork off on Monday for completion. Please advise.

## 2010-12-23 NOTE — Telephone Encounter (Signed)
Encounter opened in error

## 2010-12-23 NOTE — Telephone Encounter (Signed)
I will fill her FMLA when she brings it to [name].

## 2010-12-26 NOTE — Telephone Encounter (Signed)
O'SULLIVAN,MELISSA S., NP 12/23/2010 10:56 AM Signed  I will fill her FMLA when she brings it to [name].

## 2010-12-26 NOTE — Telephone Encounter (Signed)
See previous note

## 2010-12-27 NOTE — Telephone Encounter (Signed)
Left message on machine for pt to return my call regarding status of FMLA forms.

## 2010-12-30 ENCOUNTER — Ambulatory Visit (INDEPENDENT_AMBULATORY_CARE_PROVIDER_SITE_OTHER): Payer: BC Managed Care – PPO | Admitting: Family

## 2010-12-30 ENCOUNTER — Ambulatory Visit: Payer: BC Managed Care – PPO | Admitting: Family

## 2010-12-30 ENCOUNTER — Encounter: Payer: Self-pay | Admitting: *Deleted

## 2010-12-30 ENCOUNTER — Encounter: Payer: Self-pay | Admitting: Family

## 2010-12-30 DIAGNOSIS — I10 Essential (primary) hypertension: Secondary | ICD-10-CM

## 2010-12-30 DIAGNOSIS — F411 Generalized anxiety disorder: Secondary | ICD-10-CM

## 2010-12-30 MED ORDER — LORAZEPAM 0.5 MG PO TABS: 0.5000 mg | ORAL_TABLET | Freq: Three times a day (TID) | ORAL | Status: DC | PRN

## 2010-12-30 MED ORDER — OLMESARTAN MEDOXOMIL 20 MG PO TABS: 20.0000 mg | ORAL_TABLET | Freq: Every day | ORAL | Status: DC

## 2010-12-30 MED ORDER — CLONAZEPAM 0.5 MG PO TABS: 0.5000 mg | ORAL_TABLET | Freq: Three times a day (TID) | ORAL | Status: DC | PRN

## 2010-12-30 NOTE — Patient Instructions (Addendum)
Please arrange an appointment to see psychiatry as soon as possible. Follow up in 2 weeks for a BP check.  Please tell your psychiatrist that you have already tried the following meds: Citalopram, Fluoxetine, Effexor, Ativan and Xanax.

## 2010-12-30 NOTE — Assessment & Plan Note (Signed)
BP Readings from Last 3 Encounters:  12/30/10 140/100  12/13/10 132/90  11/01/10 132/82   BP is up today.  She is intolerant to the B Blocker.  Will plan to switch her Benicar.  Continue lasix PRN swelling.  Pt to f/u in 2 weeks.

## 2010-12-30 NOTE — Telephone Encounter (Signed)
Pt was seen today and brought FMLA paperwork for completion.

## 2010-12-30 NOTE — Progress Notes (Signed)
Subjective:    Patient ID: Michele [name], female    DOB: 23-Sep-1970, 40 y.o.   MRN: [id number]  HPI  Anxiety- Saw Dr. Richardean Chimera-  Told her to stop cymbalta and start Lexapro. She has not yet made her apt with psychiatry.  She reports no improvement with the use of xanax.  She is using this twice a day.  She has not yet scheduled her appointment with psychiatry.  She feels that she is consumed by her anxiety.   HTN- using lopressor- makes her lips "cold and tingly."  Reports that her BP was 178/123 today at the neurologist's office.    Review of Systems    see HPI  Past Medical History  Diagnosis Date  . Depression   . Hypertension   . Anemia     iron deficient, microcytic, hypochromic  . Anxiety   . Migraines   . 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 Fibroids     uterine  . Palpitations     recurrent  . Fatigue   . Tachycardia     unspecified  . Chest pain     atypical    History   Social History  . Marital Status: Married    Spouse Name: N/A    Number of Children: 2  . Years of Education: N/A   Occupational History  . 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 Smoking status: Never Smoker   . Smokeless tobacco: Not on file  . Alcohol Use: Yes     Once a month  . Drug Use: No  . Sexually Active:    Other Topics Concern  . Not on file   Social History Narrative   Regular exercise: yesDaily caffeine: 1-2 daily    Past Surgical History  Procedure Date  . Appendectomy 03/2009  . Tubal ligation 1997  . Tuboplasty / tubotubal anastomosis 2005  . Biopsy thyroid November 28, 2009    Dr. Allena Katz- endo    Family History  Problem Relation Age of Onset  . Hypertension Mother   . Cancer Mother 68    breast  . Cancer Father     colon  . Stroke Brother     handicapped due to complications from spinal meningitis  . Seizures Brother   . Asthma Son   . Arthritis Maternal Grandmother   . Stroke Maternal Grandmother   . Hypertension Maternal  Grandmother     Allergies  Allergen Reactions  . Amlodipine     Palpitations/shortness of breath    Current Outpatient Prescriptions on File Prior to Visit  Medication Sig Dispense Refill  . DULoxetine (CYMBALTA) 60 MG capsule Take 60 mg by mouth daily.        . furosemide (LASIX) 20 MG tablet Take 1 tablet (20 mg total) by mouth daily as needed.  30 tablet  0  . mometasone (NASONEX) 50 MCG/ACT nasal spray 2 sprays by Nasal route daily. Each nostril.       Marland Kitchen omeprazole (PRILOSEC) 20 MG capsule Take 20 mg by mouth daily.        . vitamin B-12 (CYANOCOBALAMIN) 100 MCG tablet Take one tablet by mouth daily       . DISCONTD: ALPRAZolam (XANAX) 0.5 MG tablet Take 1 tablet (0.5 mg total) by mouth 3 (three) times daily as needed.  90 tablet  0  . DISCONTD: metoprolol tartrate (LOPRESSOR) 25 MG tablet 1/2 tablet twice daily  30 tablet  1  BP 140/100  Pulse 107  Temp(Src) 98.2 F (36.8 C) (Oral)  Resp 20  SpO2 100%  LMP 11/24/2010    Objective:   Physical Exam  Constitutional: She appears well-developed and well-nourished.  Cardiovascular: Normal rate, regular rhythm and normal heart sounds.   Pulmonary/Chest: Effort normal and breath sounds normal.  Psychiatric: Her behavior is normal. Judgment and thought content normal.       Mildly anxious appearing female.           Assessment & Plan:  Benicar samples provided lot [id number] exp 08/14 #28

## 2010-12-30 NOTE — Assessment & Plan Note (Signed)
40 yr old female with uncontrolled anxiety.  She has been intolerant to SSRI's in the past (Citalopram and Fluoxetine have both made her drowsy).  She is also concerned about weight gain that she had on these medications.  I am concerned that she is likely to have sedation on lexapro- probably more so than the other meds.  I have changed her xanax to klonopin and have encouraged her to make an appointment with psychiatry.  I have advised her to continue the Cymbalta for now- (not to start lexapro) and will defer further changes to psychiatry.  25 minutes spent with the patient today.  Greater than 50% of this time was spent counseling pt on her anxiety.

## 2011-01-02 ENCOUNTER — Ambulatory Visit (HOSPITAL_BASED_OUTPATIENT_CLINIC_OR_DEPARTMENT_OTHER)
Admission: RE | Admit: 2011-01-02 | Discharge: 2011-01-02 | Disposition: A | Discharge status: Home / Self Care | Payer: BC Managed Care – PPO | Source: Ambulatory Visit | Attending: Family | Admitting: Family

## 2011-01-02 DIAGNOSIS — Z1231 Encounter for screening mammogram for malignant neoplasm of breast: Secondary | ICD-10-CM

## 2011-01-04 NOTE — Telephone Encounter (Signed)
FMLA forms completed, copied and forwarded to MR for scanning. Pt advised originals left at front desk for her to pick up.

## 2011-01-05 ENCOUNTER — Other Ambulatory Visit: Payer: Self-pay | Admitting: Hematology & Oncology

## 2011-01-05 ENCOUNTER — Telehealth: Payer: Self-pay | Admitting: *Deleted

## 2011-01-05 ENCOUNTER — Encounter (HOSPITAL_BASED_OUTPATIENT_CLINIC_OR_DEPARTMENT_OTHER): Payer: BC Managed Care – PPO | Admitting: Hematology & Oncology

## 2011-01-05 DIAGNOSIS — D509 Iron deficiency anemia, unspecified: Secondary | ICD-10-CM

## 2011-01-05 LAB — CBC WITH DIFFERENTIAL (CANCER CENTER ONLY)
BASO#: 0 10*3/uL (ref 0.0–0.2)
Eosinophils Absolute: 0.1 10*3/uL (ref 0.0–0.5)
HGB: 13.6 g/dL (ref 11.6–15.9)
LYMPH%: 28.9 % (ref 14.0–48.0)
MCH: 28.2 pg (ref 26.0–34.0)
MCV: 81 fL (ref 81–101)
MONO#: 0.6 10*3/uL (ref 0.1–0.9)
MONO%: 8.9 % (ref 0.0–13.0)
RBC: 4.82 10*6/uL (ref 3.70–5.32)

## 2011-01-05 MED ORDER — CLONIDINE HCL 0.1 MG PO TABS: 0.1000 mg | ORAL_TABLET | Freq: Two times a day (BID) | ORAL | Status: DC

## 2011-01-05 NOTE — Telephone Encounter (Signed)
Left message on machine to return my call. 

## 2011-01-05 NOTE — Telephone Encounter (Signed)
Pt states she saw Dr Myna Hidalgo today and her BP was 154/101. Pt states she has been monitoring it at home and readings have been around the same as today. Pt wants to know if her medication should be adjusted. Also wants to know why her blood pressure is not controlled as she reports positive medication compliance. Pt concerned that there may be some underlying cause? Please advise.

## 2011-01-05 NOTE — Telephone Encounter (Signed)
I sent rx for clonidine to her pharmacy.  This should help with BP.  She can be reassured that most people with HTN require several meds to keep their BP stable.  She should f/u in 2 weeks.

## 2011-01-06 NOTE — Telephone Encounter (Signed)
Pt notified and states she has f/u on 01/13/11 that she will keep.

## 2011-01-10 LAB — RETICULOCYTES (CHCC)
ABS Retic: 48.9 10*3/uL (ref 19.0–186.0)
Retic Ct Pct: 1 % (ref 0.4–3.1)

## 2011-01-10 LAB — IRON AND TIBC
%SAT: 14 % — ABNORMAL LOW (ref 20–55)
Iron: 47 ug/dL (ref 42–145)
TIBC: 340 ug/dL (ref 250–470)

## 2011-01-10 LAB — FERRITIN: Ferritin: 27 ng/mL (ref 10–291)

## 2011-01-13 ENCOUNTER — Encounter: Payer: Self-pay | Admitting: Family

## 2011-01-13 ENCOUNTER — Ambulatory Visit (INDEPENDENT_AMBULATORY_CARE_PROVIDER_SITE_OTHER): Payer: BC Managed Care – PPO | Admitting: Family

## 2011-01-13 VITALS — BP 136/88 | HR 78 | Temp 98.2°F | Resp 16 | Ht 65.25 in | Wt 188.0 lb

## 2011-01-13 DIAGNOSIS — N912 Amenorrhea, unspecified: Secondary | ICD-10-CM

## 2011-01-13 DIAGNOSIS — F411 Generalized anxiety disorder: Secondary | ICD-10-CM

## 2011-01-13 DIAGNOSIS — I10 Essential (primary) hypertension: Secondary | ICD-10-CM

## 2011-01-13 DIAGNOSIS — N926 Irregular menstruation, unspecified: Secondary | ICD-10-CM | POA: Insufficient documentation

## 2011-01-13 MED ORDER — OLMESARTAN MEDOXOMIL 20 MG PO TABS: 20.0000 mg | ORAL_TABLET | Freq: Every day | ORAL | Status: DC

## 2011-01-13 MED ORDER — NORELGESTROMIN-ETH ESTRADIOL 150-35 MCG/24HR TD PTWK: MEDICATED_PATCH | TRANSDERMAL | Status: DC

## 2011-01-13 NOTE — Assessment & Plan Note (Signed)
Blood pressure appears well-controlled today. We'll continue to monitor.

## 2011-01-13 NOTE — Progress Notes (Signed)
Subjective:    Patient ID: Michele [name], female    DOB: 1970-10-28, 40 y.o.   MRN: [id number]  HPI  Patient is a 40 year old female who presents today for followup of her blood pressure.  Hypertension-patient notes positive compliance with her medications. Her blood pressure has been better controlled.  Anxiety-patient is scheduled to see psychiatry this coming Monday. Overall she notes that her anxiety is somewhat improved. She has not needed the clonazepam as of yet.  Birth control-she notes that she has had irregular menses. Last menstrual period was 3 months ago. She reports that she completed a home pregnancy test yesterday which was negative.   Review of Systems See history of present illness  Past Medical History  Diagnosis Date  . Depression   . Hypertension   . Anemia     iron deficient, microcytic, hypochromic  . Anxiety   . Migraines   . 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 Fibroids     uterine  . Palpitations     recurrent  . Fatigue   . Tachycardia     unspecified  . Chest pain     atypical    History   Social History  . Marital Status: Married    Spouse Name: N/A    Number of Children: 2  . Years of Education: N/A   Occupational History  . 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 Smoking status: Never Smoker   . Smokeless tobacco: Not on file  . Alcohol Use: Yes     Once a month  . Drug Use: No  . Sexually Active:    Other Topics Concern  . Not on file   Social History Narrative   Regular exercise: yesDaily caffeine: 1-2 daily    Past Surgical History  Procedure Date  . Appendectomy 03/2009  . Tubal ligation 1997  . Tuboplasty / tubotubal anastomosis 2005  . Biopsy thyroid November 28, 2009    Dr. Allena Katz- endo    Family History  Problem Relation Age of Onset  . Hypertension Mother   . Cancer Mother 56    breast  . Cancer Father     colon  . Stroke Brother     handicapped due to complications from spinal  meningitis  . Seizures Brother   . Asthma Son   . Arthritis Maternal Grandmother   . Stroke Maternal Grandmother   . Hypertension Maternal Grandmother     Allergies  Allergen Reactions  . Amlodipine     Palpitations/shortness of breath    Current Outpatient Prescriptions on File Prior to Visit  Medication Sig Dispense Refill  . clonazePAM (KLONOPIN) 0.5 MG tablet Take 1 tablet (0.5 mg total) by mouth 3 (three) times daily as needed for anxiety.  60 tablet  0  . cloNIDine (CATAPRES) 0.1 MG tablet Take 1 tablet (0.1 mg total) by mouth 2 (two) times daily.  60 tablet  0  . DULoxetine (CYMBALTA) 60 MG capsule Take 60 mg by mouth daily.        . furosemide (LASIX) 20 MG tablet Take 1 tablet (20 mg total) by mouth daily as needed.  30 tablet  0  . mometasone (NASONEX) 50 MCG/ACT nasal spray 2 sprays by Nasal route daily. Each nostril.       Marland Kitchen omeprazole (PRILOSEC) 20 MG capsule Take 20 mg by mouth daily.        . vitamin B-12 (CYANOCOBALAMIN) 100 MCG tablet Take  one tablet by mouth daily       . DISCONTD: olmesartan (BENICAR) 20 MG tablet Take 1 tablet (20 mg total) by mouth daily.  30 tablet  0    BP 136/88  Pulse 78  Temp(Src) 98.2 F (36.8 C) (Oral)  Resp 16  Ht 5' 5.25" (1.657 m)  Wt 188 lb (85.276 kg)  BMI 31.05 kg/m2       Objective:   Physical Exam Gen.: Awake alert and in no acute distress Cardiovascular: S1-S2 regular rhythm no murmur is noted Respiratory: Breath sounds clear to auscultation bilaterally without wheezes rales or rhonchi Extremity: No peripheral edema is noted. Psychiatric: Calm, pleasant A & O x3       Assessment & Plan:

## 2011-01-13 NOTE — Assessment & Plan Note (Signed)
40 year old female who reports recent irregular menses. She is currently not using any birth control, and I'm concerned about the category D. drug she is requiring for her hypertension and her anxiety. She is agreeable to starting the Ortho Evra patch, she prefers this as she does not wish to add another pill to her regimen. We will check a serum hCG today.

## 2011-01-13 NOTE — Patient Instructions (Signed)
Please keep your upcoming appointment with psychiatry. Follow up in 3 months.

## 2011-01-13 NOTE — Assessment & Plan Note (Signed)
Clinically improved today, I have encouraged her to keep her upcoming appointment with psychiatry.

## 2011-01-16 ENCOUNTER — Telehealth: Payer: Self-pay | Admitting: Family

## 2011-01-16 ENCOUNTER — Other Ambulatory Visit: Payer: Self-pay | Admitting: Family

## 2011-01-16 ENCOUNTER — Telehealth: Payer: Self-pay | Admitting: *Deleted

## 2011-01-16 DIAGNOSIS — N912 Amenorrhea, unspecified: Secondary | ICD-10-CM

## 2011-01-16 MED ORDER — LOSARTAN POTASSIUM 50 MG PO TABS: 50.0000 mg | ORAL_TABLET | Freq: Every day | ORAL | Status: DC

## 2011-01-16 NOTE — Telephone Encounter (Signed)
Please confirm that plain losartan is on formulary.  She is not on HCTZ.  If so, we can d/c Benicar and place her on losartan 50mg .  Pt will need 1 mont follow up after med change.

## 2011-01-16 NOTE — Telephone Encounter (Signed)
Pt returned my call. I reviewed the neg pregnancy test with her. She reports that she has been having hot flashes- I have asked her to report to the lab for hormonal tesing prior to starting her birth control. She tells me she will try to come to the lab this afternoon.

## 2011-01-16 NOTE — Telephone Encounter (Signed)
Received notification from [facility] that Benicar 20mg  daily is subject to their step therapy program. Preferred alternatives are: benazepril/hctz, enalapril/hctz, lisinopril/hctz, losartan/hctz or quinapril/hctz. Please advise if one of these would be appropriate otherwise I can call insurance for prior auth.

## 2011-01-16 NOTE — Telephone Encounter (Signed)
Per Heath Lark at [employer] [phone], Losartan 50mg  once a day is covered for $15 copay. Pt has been notified and has f/u on 02/13/11 that she is aware to keep. Rx has been sent to [facility].

## 2011-01-16 NOTE — Telephone Encounter (Signed)
Pt's member # with [facility] is [id number].

## 2011-01-16 NOTE — Telephone Encounter (Signed)
Pt returned your call and asks that you call her by 1:30. Wk # [id number] or can call cell. Has appt at 2pm today.

## 2011-01-17 ENCOUNTER — Ambulatory Visit: Payer: BC Managed Care – PPO | Admitting: Family

## 2011-01-19 ENCOUNTER — Encounter (HOSPITAL_BASED_OUTPATIENT_CLINIC_OR_DEPARTMENT_OTHER): Payer: BC Managed Care – PPO | Admitting: Hematology & Oncology

## 2011-01-19 DIAGNOSIS — D509 Iron deficiency anemia, unspecified: Secondary | ICD-10-CM

## 2011-01-19 LAB — FOLLICLE STIMULATING HORMONE: FSH: 6.3 m[IU]/mL

## 2011-01-24 ENCOUNTER — Telehealth: Payer: Self-pay | Admitting: Family

## 2011-01-24 LAB — ESTRADIOL, FREE

## 2011-01-24 NOTE — Telephone Encounter (Signed)
Left message for patient to call me to discuss laboratories.

## 2011-01-25 ENCOUNTER — Encounter: Payer: Self-pay | Admitting: Family

## 2011-01-25 NOTE — Telephone Encounter (Signed)
Patient returned phone call. Best # to reach her at is [phone]

## 2011-01-25 NOTE — Telephone Encounter (Signed)
Called patient, reviewed lab results including elevated prolactin level.  She tells me that she has since gotten her period.  I advised her that it is OK to start [facility] patch.  Will send letter to Dr. Allena Katz, re: elevated prolactin level.

## 2011-01-27 NOTE — Group Therapy Note (Signed)
[name], [name]      ACCOUNT NO.:  [id number]   MEDICAL RECORD NO.:  [id number]          PATIENT TYPE:  POB   LOCATION:  [facility]                   FACILITY:  [facility]   PHYSICIAN:  Ginger Carne, MD DATE OF BIRTH:  1970-12-07   DATE OF SERVICE:  07/16/2006                                    CLINIC NOTE   The patient is here today because of a 2-week history of bilateral pelvic  pain.  She was seen at the emergency room at [facility], at which time they performed a urine pregnancy test which was  negative, and laboratory work which was also normal.  They did not do an  ultrasound.  Ultrasound obtained through our office prior to her visit on  June 18, 2006 demonstrated no abnormal findings.  There was a 3 cm  dominant follicular cyst in the right ovary, however.  She had not had a  menses since May 14, 2006.  The patient has a history of known  endometriosis by laparoscopy in 2005 in [place], [place].  She also  has had a history of infertility.   SALIENT PHYSICAL FINDINGS:  ABDOMEN:  Soft, without gross  hepatosplenomegaly.  PELVIC:  External genitalia:  Vulva and vagina normal.  Cervix smooth,  without erosions or lesions.  Uterus small, anteverted, flexed. Both adnexa  palpable, found to be normal.  Minimal tenderness.   IMPRESSION:  Anovulatory cycle, with pelvic pain.   PLAN:  I have prescribed Provera 10 mg 3 tablets a day for 5 days to bring  about progesterone withdrawal bleeding.  Since she has not had a history of  anovulatory cycles, hopefully this will not be a chronic problem.  At this  point, it is too early to tell whether her pain is due to lack of menses or  whether this is a recurrence of endometriosis.  I have asked her to keep  track of her cycle and her pain, and to return in three to four weeks if she  does not see improvement in her discomfort, and she is to call in one week  if she does not have a  menses.  Her blood pressure is 137/94 today.  I  suspect some of this is related to her anxiety of not having a menses and  pelvic discomfort.  I have asked her to have this rechecked in several weeks  as well.           ______________________________  Ginger Carne, MD     SHB/MEDQ  D:  07/16/2006  T:  07/17/2006  Job:  [id number]

## 2011-01-30 ENCOUNTER — Telehealth: Payer: Self-pay | Admitting: Family

## 2011-01-30 MED ORDER — FUROSEMIDE 20 MG PO TABS: 20.0000 mg | ORAL_TABLET | Freq: Every day | ORAL | Status: DC | PRN

## 2011-01-30 MED ORDER — CLONAZEPAM 0.5 MG PO TABS: 0.5000 mg | ORAL_TABLET | Freq: Three times a day (TID) | ORAL | Status: DC | PRN

## 2011-01-30 NOTE — Telephone Encounter (Signed)
Pt left voice message that she needed refills on her Furosemide and Klonopin. Pharmacy filled her Benicar (which had been dc'd) and Losartan. Spoke to [place] at [facility] and advised her that Benicar was changed to Losartan. She states she will inactivate Benicar on her profile. Called in Furosemide 20mg  #30 1 refill and Clonazepam 0.5mg  #60 x no refills to [place] [facility]. Pt has been notified.

## 2011-01-30 NOTE — Telephone Encounter (Signed)
Refill- furosemide 20mg  tab. Take one tablet by mouth every day as needed. Qty 30. Last fill 4.6.12

## 2011-01-31 ENCOUNTER — Other Ambulatory Visit: Payer: Self-pay | Admitting: *Deleted

## 2011-01-31 MED ORDER — CLONIDINE HCL 0.1 MG PO TABS: 0.1000 mg | ORAL_TABLET | Freq: Two times a day (BID) | ORAL | Status: DC

## 2011-01-31 NOTE — Telephone Encounter (Signed)
Received call from pt stating she needs refill on Clonidine. Refill sent to pharmacy #60 x 1 refill.

## 2011-02-08 ENCOUNTER — Encounter: Payer: Self-pay | Admitting: Family

## 2011-02-13 ENCOUNTER — Ambulatory Visit (INDEPENDENT_AMBULATORY_CARE_PROVIDER_SITE_OTHER): Payer: BC Managed Care – PPO | Admitting: Family

## 2011-02-13 ENCOUNTER — Ambulatory Visit: Payer: BC Managed Care – PPO | Admitting: Family

## 2011-02-13 ENCOUNTER — Encounter: Payer: Self-pay | Admitting: Family

## 2011-02-13 DIAGNOSIS — G43909 Migraine, unspecified, not intractable, without status migrainosus: Secondary | ICD-10-CM

## 2011-02-13 DIAGNOSIS — F411 Generalized anxiety disorder: Secondary | ICD-10-CM

## 2011-02-13 DIAGNOSIS — R635 Abnormal weight gain: Secondary | ICD-10-CM | POA: Insufficient documentation

## 2011-02-13 MED ORDER — SUMATRIPTAN SUCCINATE 50 MG PO TABS: ORAL_TABLET | ORAL | Status: DC

## 2011-02-13 MED ORDER — PROMETHAZINE HCL 25 MG PO TABS: 25.0000 mg | ORAL_TABLET | Freq: Four times a day (QID) | ORAL | Status: DC | PRN

## 2011-02-13 NOTE — Assessment & Plan Note (Signed)
I advised her against phentermine, especially in setting of HTN.  Recommended that she continue her dietary changes and exercise.

## 2011-02-13 NOTE — Assessment & Plan Note (Signed)
Will treat with imitrex and phenergan prn.

## 2011-02-13 NOTE — Patient Instructions (Signed)
Call if your symptoms worsen or if they do not improve.  Follow up in 2 months.

## 2011-02-13 NOTE — Assessment & Plan Note (Signed)
Defer management to psychiatry.  °

## 2011-02-13 NOTE — Progress Notes (Signed)
Subjective:    Patient ID: Michele [name], female    DOB: [birth year], 40 y.o.   MRN: [id number]  HPI  HA- started last night.  Tingling on the left side of her head.  Reports HA is 8/10.  HA is constant- has tried [facility] powder without relieve.  + nausea and vomitting today.  Feels dizzy.   This is similar to some of her previous bad migraines.  She thinks that her benicar and lasix may have been thrown up this AM.    Weight gain- reports that she has been watching her calories- trying to eat under 1200 calories a day.  Walking 2 miles a day. Has gained 3 pounds since last visit.  Feels discouraged.  Depression/Anxiety-  Now on Viibryd.  Does not feel like it is helping.  Has follow up with psych one day this week.  Notes that she has been using klonazepam up to 3x a day. Things at home are "bad."  Upset that her husband is not understanding about what she is going through.   Did not yet start the ortho evra- due to some confusion at the pharmacy.  Review of Systems See HPI  Past Medical History  Diagnosis Date  . Depression   . Hypertension   . Anemia     iron deficient, microcytic, hypochromic  . Anxiety   . Migraines   . 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 Fibroids     uterine  . Palpitations     recurrent  . Fatigue   . Tachycardia     unspecified  . Chest pain     atypical    History   Social History  . Marital Status: Married    Spouse Name: [name]    Number of Children: 2  . Years of Education: N/A   Occupational History  . 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 Smoking status: Never Smoker   . Smokeless tobacco: Not on file  . Alcohol Use: Yes     Once a month  . Drug Use: No  . Sexually Active:    Other Topics Concern  . Not on file   Social History Narrative   Regular exercise: yesDaily caffeine: 1-2 daily    Past Surgical History  Procedure Date  . Appendectomy 03/2009  . Tubal ligation 1997  . Tuboplasty /  tubotubal anastomosis 2005  . Biopsy thyroid November 28, 2009    Dr. Allena Katz- endo    Family History  Problem Relation Age of Onset  . Hypertension Mother   . Cancer Mother 50    breast  . Cancer Father     colon  . Stroke Brother     handicapped due to complications from spinal meningitis  . Seizures Brother   . Asthma Son   . Arthritis Maternal Grandmother   . Hypertension Maternal Grandmother   . Stroke Maternal Grandfather     Allergies  Allergen Reactions  . Amlodipine     Palpitations/shortness of breath    Current Outpatient Prescriptions on File Prior to Visit  Medication Sig Dispense Refill  . clonazePAM (KLONOPIN) 0.5 MG tablet Take 1 tablet (0.5 mg total) by mouth 3 (three) times daily as needed for anxiety.  60 tablet  0  . cloNIDine (CATAPRES) 0.1 MG tablet Take 1 tablet (0.1 mg total) by mouth 2 (two) times daily.  60 tablet  1  . furosemide (LASIX) 20 MG tablet Take  1 tablet (20 mg total) by mouth daily as needed.  30 tablet  1  . losartan (COZAAR) 50 MG tablet Take 1 tablet (50 mg total) by mouth daily.  30 tablet  1  . mometasone (NASONEX) 50 MCG/ACT nasal spray 2 sprays by Nasal route daily. Each nostril.       . norelgestromin-ethinyl estradiol (ORTHO EVRA) 150-20 MCG/24HR One patch to skin once weekly 3 sundays in a row, then no patch on week 4. Resume cycle following Sunday.  3 patch  3  . omeprazole (PRILOSEC) 20 MG capsule Take 20 mg by mouth daily.        . vitamin B-12 (CYANOCOBALAMIN) 100 MCG tablet Take one tablet by mouth daily       . DISCONTD: DULoxetine (CYMBALTA) 60 MG capsule Take 60 mg by mouth daily.          BP 144/100  Pulse 96  Temp(Src) 97.8 F (36.6 C) (Oral)  Resp 16  Ht 5' 5.25" (1.657 m)  Wt 191 lb 1.9 oz (86.691 kg)  BMI 31.56 kg/m2       Objective:   Physical Exam  Constitutional:       Tired, uncomfortable appearing female, awake, alert, laying in dark exam room.   Cardiovascular: Normal rate and regular rhythm.     Pulmonary/Chest: Effort normal and breath sounds normal.  Psychiatric:       Appears depressed, speech somewhat slowed.  Not tearful. Answers questions appropriately.   Neuro: EOM's intact, + facial symmetry, bilateral upper extremity and lower extremity strength is 5/5.        Assessment & Plan:

## 2011-02-14 ENCOUNTER — Emergency Department (HOSPITAL_BASED_OUTPATIENT_CLINIC_OR_DEPARTMENT_OTHER)
Admission: EM | Admit: 2011-02-14 | Discharge: 2011-02-14 | Disposition: A | Discharge status: Home / Self Care | Payer: BC Managed Care – PPO | Attending: Emergency Medicine | Admitting: Emergency Medicine

## 2011-02-14 ENCOUNTER — Telehealth: Payer: Self-pay | Admitting: Family

## 2011-02-14 DIAGNOSIS — I10 Essential (primary) hypertension: Secondary | ICD-10-CM | POA: Insufficient documentation

## 2011-02-14 DIAGNOSIS — R42 Dizziness and giddiness: Secondary | ICD-10-CM | POA: Insufficient documentation

## 2011-02-14 DIAGNOSIS — G43909 Migraine, unspecified, not intractable, without status migrainosus: Secondary | ICD-10-CM | POA: Insufficient documentation

## 2011-02-14 LAB — CBC
HCT: 39.2 % (ref 36.0–46.0)
MCV: 84.1 fL (ref 78.0–100.0)
RDW: 14.3 % (ref 11.5–15.5)
WBC: 6.9 10*3/uL (ref 4.0–10.5)

## 2011-02-14 LAB — BASIC METABOLIC PANEL
CO2: 25 mEq/L (ref 19–32)
Chloride: 103 mEq/L (ref 96–112)
Glucose, Bld: 110 mg/dL — ABNORMAL HIGH (ref 70–99)
Potassium: 3.7 mEq/L (ref 3.5–5.1)
Sodium: 141 mEq/L (ref 135–145)

## 2011-02-14 LAB — DIFFERENTIAL
Eosinophils Relative: 1 % (ref 0–5)
Lymphocytes Relative: 23 % (ref 12–46)
Lymphs Abs: 1.6 10*3/uL (ref 0.7–4.0)
Monocytes Absolute: 0.5 10*3/uL (ref 0.1–1.0)

## 2011-02-14 LAB — URINALYSIS, ROUTINE W REFLEX MICROSCOPIC
Bilirubin Urine: NEGATIVE
Glucose, UA: NEGATIVE mg/dL
Hgb urine dipstick: NEGATIVE
Specific Gravity, Urine: 1.005 (ref 1.005–1.030)
Urobilinogen, UA: 0.2 mg/dL (ref 0.0–1.0)
pH: 7 (ref 5.0–8.0)

## 2011-02-14 MED ORDER — CLONIDINE HCL 0.1 MG PO TABS: 0.1000 mg | ORAL_TABLET | Freq: Two times a day (BID) | ORAL | Status: DC

## 2011-02-14 MED ORDER — ONDANSETRON HCL 4 MG PO TABS: 4.0000 mg | ORAL_TABLET | Freq: Three times a day (TID) | ORAL | Status: DC | PRN

## 2011-02-14 NOTE — Telephone Encounter (Signed)
Left detailed message at pt's work # per pt's request re: instructions below and to call if any questions. Clonidine rx that was sent to [facility] today has been cancelled by Tresa Endo. Pt was advised to call us when she will need a refill on Clonidine with new directions.

## 2011-02-14 NOTE — Telephone Encounter (Signed)
Pt reports ongoing nausea, can't take phenergan at work. BP 160/103.  Still has headache- per phone message. Will increase Clonidine to 0.2 BID and add zofran PRN in place of phenergan. She may also use aleve one tablet PO BID PRN.  Please notify patient.

## 2011-02-22 ENCOUNTER — Telehealth: Payer: Self-pay | Admitting: *Deleted

## 2011-02-22 MED ORDER — ALPRAZOLAM 0.5 MG PO TABS: 0.5000 mg | ORAL_TABLET | Freq: Three times a day (TID) | ORAL | Status: DC | PRN

## 2011-02-22 NOTE — Telephone Encounter (Signed)
Pt states she is unable to take Clonazepam due to it making her sick like the lorazepam did. Pt states she has tried xanax in the past and was able to tolerate that. Pt would like rx for xanax. Please advise.

## 2011-02-22 NOTE — Telephone Encounter (Signed)
She can stop klonopin and restart alprazolam PRN. Rx has been called in to pharmacy.

## 2011-02-23 NOTE — Telephone Encounter (Signed)
Pt.notified

## 2011-02-28 NOTE — Telephone Encounter (Signed)
This encounter was created in error - please disregard.

## 2011-02-28 NOTE — Telephone Encounter (Signed)
Patient seen in office on 12/30/2010

## 2011-03-10 LAB — PULMONARY FUNCTION TEST

## 2011-04-14 ENCOUNTER — Ambulatory Visit: Payer: BC Managed Care – PPO | Admitting: Family

## 2011-04-17 ENCOUNTER — Ambulatory Visit: Payer: BC Managed Care – PPO | Admitting: Family

## 2011-05-21 ENCOUNTER — Encounter (HOSPITAL_COMMUNITY): Payer: Self-pay | Admitting: Obstetrics & Gynecology

## 2011-05-21 ENCOUNTER — Inpatient Hospital Stay (HOSPITAL_COMMUNITY): Payer: BC Managed Care – PPO | Admitting: Anesthesiology

## 2011-05-21 ENCOUNTER — Encounter (HOSPITAL_COMMUNITY): Payer: Self-pay | Admitting: Anesthesiology

## 2011-05-21 ENCOUNTER — Encounter (HOSPITAL_COMMUNITY): Payer: Self-pay | Admitting: Obstetrics and Gynecology

## 2011-05-21 ENCOUNTER — Other Ambulatory Visit: Payer: Self-pay | Admitting: Obstetrics & Gynecology

## 2011-05-21 ENCOUNTER — Encounter (HOSPITAL_COMMUNITY)
Admission: AD | Disposition: A | Discharge status: Home / Self Care | Payer: Self-pay | Source: Ambulatory Visit | Attending: Obstetrics & Gynecology

## 2011-05-21 ENCOUNTER — Ambulatory Visit (HOSPITAL_COMMUNITY)
Admission: AD | Admit: 2011-05-21 | Discharge: 2011-05-21 | Disposition: A | Discharge status: Home / Self Care | Payer: BC Managed Care – PPO | Source: Ambulatory Visit | Attending: Obstetrics & Gynecology | Admitting: Obstetrics & Gynecology

## 2011-05-21 DIAGNOSIS — N920 Excessive and frequent menstruation with regular cycle: Principal | ICD-10-CM | POA: Diagnosis present

## 2011-05-21 HISTORY — PX: DILATION AND CURETTAGE OF UTERUS: SHX78

## 2011-05-21 LAB — CBC
HCT: 30.4 % — ABNORMAL LOW (ref 36.0–46.0)
MCHC: 33.2 g/dL (ref 30.0–36.0)
MCV: 95 fL (ref 78.0–100.0)
RDW: 17.8 % — ABNORMAL HIGH (ref 11.5–15.5)

## 2011-05-21 SURGERY — DILATION AND CURETTAGE
Anesthesia: Monitor Anesthesia Care | Site: Vagina | Wound class: Clean Contaminated

## 2011-05-21 MED ORDER — SODIUM CHLORIDE 0.9 % IR SOLN
Status: DC | PRN
  Administered 2011-05-21: 1000 mL

## 2011-05-21 MED ORDER — FENTANYL CITRATE 0.05 MG/ML IJ SOLN
INTRAMUSCULAR | Status: DC | PRN
  Administered 2011-05-21: 100 ug via INTRAVENOUS

## 2011-05-21 MED ORDER — SCOPOLAMINE 1 MG/3DAYS TD PT72: 1.0000 | MEDICATED_PATCH | Freq: Once | TRANSDERMAL | Status: DC | PRN

## 2011-05-21 MED ORDER — FAMOTIDINE 20 MG PO TABS: 20.0000 mg | ORAL_TABLET | Freq: Once | ORAL | Status: DC | PRN

## 2011-05-21 MED ORDER — FAMOTIDINE IN NACL 20-0.9 MG/50ML-% IV SOLN
INTRAVENOUS | Status: AC
  Administered 2011-05-21: 09:00:00
  Filled 2011-05-21: qty 50

## 2011-05-21 MED ORDER — CITRIC ACID-SODIUM CITRATE 334-500 MG/5ML PO SOLN
ORAL | Status: AC
  Administered 2011-05-21: 30 mL via ORAL
  Filled 2011-05-21: qty 15

## 2011-05-21 MED ORDER — PROMETHAZINE HCL 25 MG/ML IJ SOLN: 6.2500 mg | INTRAMUSCULAR | Status: DC | PRN

## 2011-05-21 MED ORDER — CITRIC ACID-SODIUM CITRATE 334-500 MG/5ML PO SOLN
30.0000 mL | Freq: Once | ORAL | Status: AC | PRN
  Administered 2011-05-21: 30 mL via ORAL

## 2011-05-21 MED ORDER — LIDOCAINE HCL (CARDIAC) 20 MG/ML IV SOLN
INTRAVENOUS | Status: DC | PRN
  Administered 2011-05-21: 40 mg via INTRAVENOUS
  Administered 2011-05-21: 50 mg via INTRAVENOUS

## 2011-05-21 MED ORDER — ACETAMINOPHEN 325 MG PO TABS: 325.0000 mg | ORAL_TABLET | ORAL | Status: DC | PRN

## 2011-05-21 MED ORDER — LACTATED RINGERS IV SOLN
INTRAVENOUS | Status: DC
  Administered 2011-05-21 (×2): via INTRAVENOUS

## 2011-05-21 MED ORDER — PROPOFOL 10 MG/ML IV EMUL
INTRAVENOUS | Status: DC | PRN
  Administered 2011-05-21: 200 mg via INTRAVENOUS
  Administered 2011-05-21: 80 mg via INTRAVENOUS

## 2011-05-21 MED ORDER — CHLOROPROCAINE HCL 1 % IJ SOLN
INTRAMUSCULAR | Status: DC | PRN
  Administered 2011-05-21: 10 mL

## 2011-05-21 MED ORDER — PANTOPRAZOLE SODIUM 40 MG PO TBEC: 40.0000 mg | DELAYED_RELEASE_TABLET | Freq: Once | ORAL | Status: DC | PRN

## 2011-05-21 MED ORDER — FENTANYL CITRATE 0.05 MG/ML IJ SOLN: 25.0000 ug | INTRAMUSCULAR | Status: DC | PRN

## 2011-05-21 MED ORDER — ONDANSETRON HCL 4 MG/2ML IJ SOLN
INTRAMUSCULAR | Status: DC | PRN
  Administered 2011-05-21: 4 mg via INTRAVENOUS

## 2011-05-21 MED ORDER — KETOROLAC TROMETHAMINE 30 MG/ML IJ SOLN
INTRAMUSCULAR | Status: DC | PRN
  Administered 2011-05-21: 30 mg via INTRAVENOUS

## 2011-05-21 MED ORDER — LACTATED RINGERS IV SOLN: INTRAVENOUS | Status: DC

## 2011-05-21 MED ORDER — DEXAMETHASONE SODIUM PHOSPHATE 10 MG/ML IJ SOLN
INTRAMUSCULAR | Status: DC | PRN
  Administered 2011-05-21: 10 mg via INTRAVENOUS

## 2011-05-21 MED ORDER — METOCLOPRAMIDE HCL 10 MG PO TABS: 10.0000 mg | ORAL_TABLET | Freq: Once | ORAL | Status: DC | PRN

## 2011-05-21 MED ORDER — MIDAZOLAM HCL 5 MG/5ML IJ SOLN
INTRAMUSCULAR | Status: DC | PRN
  Administered 2011-05-21: 1 mg via INTRAVENOUS

## 2011-05-21 SURGICAL SUPPLY — 14 items
CATH ROBINSON RED A/P 16FR (CATHETERS) ×2 IMPLANT
CLOTH BEACON ORANGE TIMEOUT ST (SAFETY) ×2 IMPLANT
CONTAINER PREFILL 10% NBF 60ML (FORM) ×2 IMPLANT
DECANTER SPIKE VIAL GLASS SM (MISCELLANEOUS) ×2 IMPLANT
DRAPE UTILITY XL STRL (DRAPES) ×4 IMPLANT
GLOVE BIO SURGEON STRL SZ7 (GLOVE) ×4 IMPLANT
GLOVE BIOGEL PI IND STRL 7.0 (GLOVE) ×3 IMPLANT
GLOVE BIOGEL PI INDICATOR 7.0 (GLOVE) ×3
GLOVE SKINSENSE NS SZ6.5 (GLOVE) ×4
GLOVE SKINSENSE STRL SZ6.5 (GLOVE) ×4 IMPLANT
NEEDLE SPNL 22GX3.5 QUINCKE BK (NEEDLE) ×2 IMPLANT
PACK VAGINAL MINOR WOMEN LF (CUSTOM PROCEDURE TRAY) ×2 IMPLANT
PAD PREP 24X48 CUFFED NSTRL (MISCELLANEOUS) ×2 IMPLANT
TOWEL OR 17X24 6PK STRL BLUE (TOWEL DISPOSABLE) ×2 IMPLANT

## 2011-05-21 NOTE — ED Provider Notes (Signed)
History    Chief Complaint  Patient presents with  . Vaginal Bleeding   HPI 40 yo, menometrorrhagia, enlarged uterus with fibroids. Failed medical therapy to stop bleeding, dropped Hgb from 12 to 10 in the last 2 wks or so due to bleeding. Started bleeding very heavy (large puddle of blood) this morning.  Well controlled HTN, non smoker. Med/Surg hx reviewed. No abn Pap hx. No breast complaints.   Past Medical History  Diagnosis Date  . Depression   . Hypertension   . Anemia     iron deficient, microcytic, hypochromic  . Anxiety   . Migraines   . 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 Fibroids     uterine  . Palpitations     recurrent  . Fatigue   . Tachycardia     unspecified  . Chest pain     atypical    Past Surgical History  Procedure Date  . Appendectomy 03/2009  . Tubal ligation 1997  . Tuboplasty / tubotubal anastomosis 2005  . Biopsy thyroid November 28, 2009    Dr. Allena Katz- endo    Family History  Problem Relation Age of Onset  . Hypertension Mother   . Cancer Mother 63    breast  . Cancer Father     colon  . Stroke Brother     handicapped due to complications from spinal meningitis  . Seizures Brother   . Asthma Son   . Arthritis Maternal Grandmother   . Hypertension Maternal Grandmother   . Stroke Maternal Grandfather     History  Substance Use Topics  . Smoking status: Never Smoker   . Smokeless tobacco: Not on file  . Alcohol Use: Yes     Once a month    Allergies:  Allergies  Allergen Reactions  . Amlodipine     Palpitations/shortness of breath    Prescriptions prior to admission  Medication Sig Dispense Refill  . Amlodipine-Valsartan-HCTZ (EXFORGE HCT) 5-160-12.5 MG TABS Take 1 tablet by mouth daily.        . DULoxetine (CYMBALTA) 30 MG capsule Take 30 mg by mouth daily.        . medroxyPROGESTERone (PROVERA) 10 MG tablet Take 10 mg by mouth 2 (two) times daily.        . nebivolol (BYSTOLIC) 5 MG tablet Take 5 mg by mouth daily.         . vitamin B-12 (CYANOCOBALAMIN) 100 MCG tablet Take one tablet by mouth daily       . ALPRAZolam (XANAX) 0.5 MG tablet Take 1 tablet (0.5 mg total) by mouth 3 (three) times daily as needed for sleep or anxiety.  30 tablet  0  . cloNIDine (CATAPRES) 0.1 MG tablet Take 0.2 mg by mouth 2 (two) times daily.        . cloNIDine (CATAPRES) 0.1 MG tablet Take 1 tablet (0.1 mg total) by mouth 2 (two) times daily.  60 tablet  1  . furosemide (LASIX) 20 MG tablet Take 1 tablet (20 mg total) by mouth daily as needed.  30 tablet  1  . losartan (COZAAR) 50 MG tablet Take 1 tablet (50 mg total) by mouth daily.  30 tablet  1  . mometasone (NASONEX) 50 MCG/ACT nasal spray 2 sprays by Nasal route daily. Each nostril.       . norelgestromin-ethinyl estradiol (ORTHO EVRA) 150-20 MCG/24HR One patch to skin once weekly 3 sundays in a row, then no patch on week 4.  Resume cycle following Sunday.  3 patch  3  . omeprazole (PRILOSEC) 20 MG capsule Take 20 mg by mouth daily.        . ondansetron (ZOFRAN) 4 MG tablet Take 1 tablet (4 mg total) by mouth every 8 (eight) hours as needed for nausea.  20 tablet  0  . SUMAtriptan (IMITREX) 50 MG tablet Take 1 tab at start of headache, may repeat once 2 hours later if your symptoms return or do not improve.  15 tablet  0  . Vilazodone HCl (VIIBRYD) 40 MG TABS Take 1 tablet by mouth daily.          ROS no SOB/CP, but has fatigue due to bleeding.   Physical Exam  Blood pressure 124/82, pulse 90, temperature 98.8 F (37.1 C), temperature source Oral, resp. rate 18, height 5\' 4"  (1.626 m), weight 88.27 kg (194 lb 9.6 oz).  Physical Exam A&O x 3, no acute distress. Pleasant HEENT neg, no thyromegaly Lungs CTA bilat CV RRR, A1S2 normal Abdo soft, non tender, non acute Extr no edema/ tenderness Pelvic Uterus slightly enlarged, cx open os, bleeding.     Procedures   None   Assessment and Plan  Menometrorrhagia, failed medical therapy, heavy bleeding and anemia, got much  heavier tis morning. Will not try medical therapy again since has not responded. Plan D&C, cannot perform hysteroscopy due to heavy bleeding, pt understands risks/ benefits, agrees.  CBC, NPO, IV, consent.   Deno Sida R 05/21/2011, 9:32 AM

## 2011-05-21 NOTE — Anesthesia Preprocedure Evaluation (Signed)
Anesthesia Evaluation  Name, MR# and DOB Patient awake  General Assessment Comment  Reviewed: Allergy & Precautions, H&P , Patient's Chart, lab work & pertinent test results, reviewed documented beta blocker date and time   History of Anesthesia Complications Negative for: history of anesthetic complications  Airway Mallampati: II TM Distance: >3 FB Neck ROM: full    Dental No notable dental hx.    Pulmonary  clear to auscultation  pulmonary exam normalPulmonary Exam Normal breath sounds clear to auscultation none    Cardiovascular Exercise Tolerance: Good hypertension, regular Normal    Neuro/Psych Negative Neurological ROS  Negative Psych ROS  GI/Hepatic/Renal negative GI ROS  negative Liver ROS  negative Renal ROS        Endo/Other  Negative Endocrine ROS (+)      Abdominal   Musculoskeletal   Hematology negative hematology ROS (+)   Peds  Reproductive/Obstetrics negative OB ROS    Anesthesia Other Findings Bleeding Npo unknown now... Will check            Anesthesia Physical Anesthesia Plan  ASA: III and Emergent  Anesthesia Plan: General   Post-op Pain Management:    Induction: Rapid sequence and Cricoid pressure planned  Airway Management Planned: Oral ETT  Additional Equipment:   Intra-op Plan:   Post-operative Plan:   Informed Consent: I have reviewed the patients History and Physical, chart, labs and discussed the procedure including the risks, benefits and alternatives for the proposed anesthesia with the patient or authorized representative who has indicated his/her understanding and acceptance.   Dental Advisory Given  Plan Discussed with: CRNA and Surgeon  Anesthesia Plan Comments:         Anesthesia Quick Evaluation

## 2011-05-21 NOTE — Op Note (Signed)
05/21/2011 10:22 AM. PATIENT:  Michele [name]  40 y.o. female  PRE-OPERATIVE DIAGNOSIS:  menorrhagia/bleeding POST-OPERATIVE DIAGNOSIS:  menorrhagia/bleeding PROCEDURE:  DILATATION AND CURETTAGE (D&C) SURGEON: Keasha Malkiewicz, MD  ASSISTANTS: None ANESTHESIA: General (LMA) and Paracervical block with 10 cc of 1% Nesacaine OR FLUID I/O:  1100 cc LR EBL: 10 cc. SPECIMEN: Endometrial curettings DISPOSITION OF SPECIMEN: Pathology COUNTS:  YES, correct. PLAN OF CARE: Discharge to home after PACU PATIENT DISPOSITION:  PACU - hemodynamically stable.   Procedure:  40 yo, with menometrorrhagia, failed OCs/ progestin trial with ongoing bleeding for 2-3 weeks, dropped Hgb from 12 to 10 and this morning had worsening of menorrhagia, so presented to MAU/ER. D&C consent obtained after discussing risks/ complications of infection/ bleeding/ uterine perforation. Also reviewed that hysteroscopy cannot be done due to dilated cervix and ongoing bleeding. Patient understands, informed written consent was obtained. Patient was brought in to the OR with IV running. Time out was carried out and confirmed. She underwent general anesthesia with LMA. Lithotomy given. Parts were prepped and draped in standard sterile fashion. Bladder was not catheterized since its short procedure. Cervix was exposed with speculum and anterior lip grasped with tenaculum. 10 cc 1% nesacaine paracervical block given. Cervical os was already dilated and active bleeding noted. Sharp curettage of endometrial cavity done and specimen sent to pathology. Hemostasis improved.  All instruments were removed, counts correct x 2. No complications, pt tolerated procedure well and brought to PACU. Will be discharged home from PACU.   Hilary Hertz, MD

## 2011-05-21 NOTE — Anesthesia Postprocedure Evaluation (Signed)
Anesthesia Post Note  Patient: Michele [name]  Procedure(s) Performed:  DILATATION AND CURETTAGE (D&C) - dilitation and currettage/endometrial currettings  Anesthesia type: GA  Patient location: PACU  Post pain: Pain level controlled  Post assessment: Post-op Vital signs reviewed  Last Vitals:  Filed Vitals:   05/21/11 1109  BP: 105/58  Pulse: 86  Temp: 98.6 F (37 C)  Resp: 16    Post vital signs: Reviewed  Level of consciousness: sedated  Complications:temporary crown taken out during case 2/2 bronchospasm and worry for need to intubate

## 2011-05-21 NOTE — Progress Notes (Signed)
Patient states she has been seen by Dr. Juliene Pina for abnormal vaginal bleeding, was scheduled to have a D&C yesterday but was taking Provera and this seemed to stop it, woke up this morning in a pool of blood.

## 2011-05-21 NOTE — Progress Notes (Signed)
Dr. Juliene Pina notified of patient arrival to MAU for bleeding. Dr. Juliene Pina plans to take patient to OR for D/C. Orders received, prep for OR, Dr. Juliene Pina will be here in 30 mins to see patient.

## 2011-05-21 NOTE — Progress Notes (Signed)
Pt presents to MAU with complaints of vaginal bleeding. The patient is a patient of [employer]. She was started on Provera on Thurs. 9/6 and was told to come to MAU Saturday 9/8 for D/C is bleeding cont. Pt states the bleeding stopped over the weekend so she did not come to MAU on 9/8. Pt states this morning she woke up in a puddle of blood.

## 2011-05-21 NOTE — Transfer of Care (Signed)
Immediate Anesthesia Transfer of Care Note  Patient: Michele [name]  Procedure(s) Performed:  DILATATION AND CURETTAGE (D&C) - dilitation and currettage/endometrial currettings  Patient Location: PACU  Anesthesia Type: General  Level of Consciousness: awake, alert  and oriented  Airway & Oxygen Therapy: Patient Spontanous Breathing and Patient connected to nasal cannula oxygen  Post-op Assessment: Report given to PACU RN and Post -op Vital signs reviewed and stable  Post vital signs: Reviewed and stable  Complications: No apparent anesthesia complications

## 2011-05-24 ENCOUNTER — Other Ambulatory Visit: Payer: Self-pay | Admitting: Hematology & Oncology

## 2011-05-24 ENCOUNTER — Encounter (HOSPITAL_COMMUNITY): Payer: Self-pay | Admitting: Obstetrics & Gynecology

## 2011-05-24 ENCOUNTER — Encounter (HOSPITAL_BASED_OUTPATIENT_CLINIC_OR_DEPARTMENT_OTHER): Payer: BC Managed Care – PPO | Admitting: Hematology & Oncology

## 2011-05-24 DIAGNOSIS — D509 Iron deficiency anemia, unspecified: Secondary | ICD-10-CM

## 2011-05-24 LAB — TECHNOLOGIST REVIEW CHCC SATELLITE: Tech Review: 6

## 2011-05-24 LAB — CBC WITH DIFFERENTIAL (CANCER CENTER ONLY)
BASO%: 0.1 % (ref 0.0–2.0)
EOS%: 0.3 % (ref 0.0–7.0)
LYMPH#: 2.3 10*3/uL (ref 0.9–3.3)
MCH: 32.4 pg (ref 26.0–34.0)
MCHC: 34.2 g/dL (ref 32.0–36.0)
MONO%: 7.7 % (ref 0.0–13.0)
NEUT#: 4.7 10*3/uL (ref 1.5–6.5)
Platelets: 225 10*3/uL (ref 145–400)

## 2011-05-24 LAB — FERRITIN: Ferritin: 56 ng/mL (ref 10–291)

## 2011-06-02 LAB — HCG, QUANTITATIVE, PREGNANCY: hCG, Beta Chain, Quant, S: 34 — ABNORMAL HIGH

## 2011-06-02 LAB — CBC
HCT: 31.5 — ABNORMAL LOW
Platelets: 224
RDW: 15.2
WBC: 7.8

## 2011-06-02 LAB — ABO/RH: ABO/RH(D): B POS

## 2011-07-07 ENCOUNTER — Encounter (HOSPITAL_BASED_OUTPATIENT_CLINIC_OR_DEPARTMENT_OTHER): Payer: BC Managed Care – PPO | Admitting: Hematology & Oncology

## 2011-07-07 DIAGNOSIS — D509 Iron deficiency anemia, unspecified: Secondary | ICD-10-CM

## 2011-08-14 ENCOUNTER — Encounter: Payer: Self-pay | Admitting: Cardiovascular Disease

## 2011-08-14 ENCOUNTER — Ambulatory Visit (INDEPENDENT_AMBULATORY_CARE_PROVIDER_SITE_OTHER): Payer: BC Managed Care – PPO | Admitting: Cardiovascular Disease

## 2011-08-14 DIAGNOSIS — R0602 Shortness of breath: Secondary | ICD-10-CM | POA: Insufficient documentation

## 2011-08-14 DIAGNOSIS — I10 Essential (primary) hypertension: Secondary | ICD-10-CM

## 2011-08-14 DIAGNOSIS — R079 Chest pain, unspecified: Secondary | ICD-10-CM

## 2011-08-14 NOTE — Assessment & Plan Note (Signed)
The patient has progressive symptoms of shortness of breath associated with chest pain. She has hypertension and hyperlipidemia. She reports that her symptoms have predated all of her weight gain. I think an exercise echocardiogram would be appropriate to assess for the possibility of ischemic heart disease. At the present time we do not have a clear diagnosis of why the patient has chest pain, shortness of breath, or tachycardia palpitations. As stated previously, she has undergone previous outpatient telemetry monitoring and an echocardiogram.

## 2011-08-14 NOTE — Assessment & Plan Note (Signed)
Blood pressure seems to be labile. Her pressure is mildly elevated today but I'm sure there is a white coat component to this. I recommended that she obtain a home blood pressure cuff for regular monitoring so that she can record her blood pressures. She has been following with Dr. Parke Simmers.

## 2011-08-14 NOTE — Progress Notes (Signed)
HPI:  This is a 40 year old woman presenting for followup evaluation. The patient has previously been seen for tachycardia palpitations. She underwent an outpatient monitoring and an echocardiogram, both of which were unrevealing. She continues to have multiple symptoms. She has been feeling worse over the last month. She complains of exertional dyspnea with minimal activity. This is been progressive. She denies orthopnea or PND. She continues to have tachycardia palpitations that wake her up from sleep in the middle of the night. She complains of chest pain radiating up to the neck. The pains feel sharp and are associated with feeling of rapid heartbeat and shortness of breath. The pains occur with both exertion and at rest. She complains of leg swelling on an intermittent basis. She's had a difficult time controlling her blood pressure. She's tried multiple agents and had problems with various meds. She took Bystolic and had no major side effects with a 5 mg dose, but couldn't take the 10 mg dose because of profound fatigue. She had swelling with amlodipine.  She has gained 30 pounds over the past 6 months. She reports that she eats healthy but has been very sedentary because she's been feeling so bad.  Outpatient Encounter Prescriptions as of 08/14/2011  Medication Sig Dispense Refill  . ALPRAZolam (XANAX) 0.5 MG tablet Take 1 mg by mouth 3 (three) times daily as needed.        . Azilsartan-Chlorthalidone (EDARBYCLOR) 40-25 MG TABS Take 1 tablet by mouth daily.        . SUMAtriptan (IMITREX) 50 MG tablet Take 1 tab at start of headache, may repeat once 2 hours later if your symptoms return or do not improve.  15 tablet  0  . DISCONTD: ALPRAZolam (XANAX) 0.5 MG tablet Take 1 tablet (0.5 mg total) by mouth 3 (three) times daily as needed for sleep or anxiety.  30 tablet  0  . DISCONTD: Amlodipine-Valsartan-HCTZ (EXFORGE HCT) 5-160-12.5 MG TABS Take 1 tablet by mouth daily.        Marland Kitchen DISCONTD: DULoxetine  (CYMBALTA) 30 MG capsule Take 30 mg by mouth daily.        Marland Kitchen DISCONTD: nebivolol (BYSTOLIC) 5 MG tablet Take 5 mg by mouth daily.        Marland Kitchen DISCONTD: vitamin B-12 (CYANOCOBALAMIN) 100 MCG tablet Take one tablet by mouth daily         Allergies  Allergen Reactions  . Amlodipine     Palpitations/shortness of breath    Past Medical History  Diagnosis Date  . Depression   . Hypertension   . Anemia     iron deficient, microcytic, hypochromic  . Anxiety   . Migraines   . 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 Fibroids     uterine  . Palpitations     recurrent  . Fatigue   . Tachycardia     unspecified  . Chest pain     atypical    ROS: Positive for abnormal menstrual bleeding, otherwise negative except as per HPI  BP 138/90  Pulse 105  Ht 5\' 5"  (1.651 m)  Wt 90.266 kg (199 lb)  BMI 33.12 kg/m2  PHYSICAL EXAM: Pt is alert and oriented, overweight woman in NAD HEENT: normal Neck: JVP - normal, carotids 2+= without bruits Lungs: CTA bilaterally CV: RRR without murmur or gallop Abd: soft, NT, Positive BS, no hepatomegaly Ext: no C/C/E, distal pulses intact and equal Skin: warm/dry no rash  ASSESSMENT AND PLAN:

## 2011-08-14 NOTE — Patient Instructions (Addendum)
Your physician has requested that you regularly monitor and record your blood pressure readings at home. Please use the same machine at the same time of day to check your readings and record them to bring to your follow-up visit.  Your physician recommends that you continue on your current medications as directed. Please refer to the Current Medication list given to you today.  Your physician has requested that you have a stress echocardiogram. For further information please visit [email or web address] Please follow instruction sheet as given.  Your physician recommends that you schedule a follow-up appointment in 3-4 WEEKS.

## 2011-08-16 ENCOUNTER — Ambulatory Visit (INDEPENDENT_AMBULATORY_CARE_PROVIDER_SITE_OTHER): Payer: BC Managed Care – PPO | Admitting: Family

## 2011-08-16 ENCOUNTER — Encounter: Payer: Self-pay | Admitting: Family

## 2011-08-16 DIAGNOSIS — J40 Bronchitis, not specified as acute or chronic: Secondary | ICD-10-CM

## 2011-08-16 MED ORDER — BENZONATATE 100 MG PO CAPS: 100.0000 mg | ORAL_CAPSULE | Freq: Three times a day (TID) | ORAL | Status: AC | PRN

## 2011-08-16 MED ORDER — AZITHROMYCIN 250 MG PO TABS: ORAL_TABLET | ORAL | Status: AC

## 2011-08-16 NOTE — Patient Instructions (Signed)

## 2011-08-16 NOTE — Progress Notes (Signed)
Subjective:    Patient ID: Michele [name], female    DOB: 1971/05/28, 40 y.o.   MRN: [id number]  HPI  [name] is a 40 yr old female who presents today with chief complaint of cough. Pt reports that her symptoms started 9 days ago. Initially had nasal congestion.  + aching/fatigue. Cough, HA, knees/elbows.  Cough is dry. Cough is worsening.    She reports that most recently she has been following with Dr. Arvella Nigh. She had a sleep study- and was diagnosed with sleep apnea.  She is wearing CPAP, notes that she is feeling a little better.  She tells me that she is also scheduled to see Dr. Excell Seltzer for stress echo.     Review of Systems See HPI  Past Medical History  Diagnosis Date  . Depression   . Hypertension   . Anemia     iron deficient, microcytic, hypochromic  . Anxiety   . Migraines   . 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 Fibroids     uterine  . Palpitations     recurrent  . Fatigue   . Tachycardia     unspecified  . Chest pain     atypical    History   Social History  . Marital Status: Married    Spouse Name: [name]    Number of Children: 2  . Years of Education: N/A   Occupational History  . 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 Smoking status: Never Smoker   . Smokeless tobacco: Not on file  . Alcohol Use: Yes     Once a month  . Drug Use: No  . Sexually Active: Yes    Birth Control/ Protection: None   Other Topics Concern  . Not on file   Social History Narrative   Regular exercise: yesDaily caffeine: 1-2 daily    Past Surgical History  Procedure Date  . Appendectomy 03/2009  . Tubal ligation 1997  . Tuboplasty / tubotubal anastomosis 2005  . Biopsy thyroid November 28, 2009    Dr. Allena Katz- endo  . Dilation and curettage of uterus 05/21/2011    Procedure: DILATATION AND CURETTAGE (D&C);  Surgeon: Robley Fries;  Location: [facility];  Service: Gynecology;  Laterality: N/A;  dilitation and currettage/endometrial  currettings    Family History  Problem Relation Age of Onset  . Hypertension Mother   . Cancer Mother 26    breast  . Cancer Father     colon  . Stroke Brother     handicapped due to complications from spinal meningitis  . Seizures Brother   . Asthma Son   . Arthritis Maternal Grandmother   . Hypertension Maternal Grandmother   . Stroke Maternal Grandfather     Allergies  Allergen Reactions  . Amlodipine     Palpitations/shortness of breath    Current Outpatient Prescriptions on File Prior to Visit  Medication Sig Dispense Refill  . ALPRAZolam (XANAX) 0.5 MG tablet Take 1 mg by mouth 3 (three) times daily as needed.        . Azilsartan-Chlorthalidone (EDARBYCLOR) 40-25 MG TABS Take 1 tablet by mouth daily.        . SUMAtriptan (IMITREX) 50 MG tablet Take 1 tab at start of headache, may repeat once 2 hours later if your symptoms return or do not improve.  15 tablet  0    BP 126/82  Pulse 116  Temp(Src) 97.9 F (  36.6 C) (Oral)  Resp 16  Wt 201 lb 1.3 oz (91.209 kg)  SpO2 100%       Objective:   Physical Exam  Constitutional: She appears well-developed and well-nourished.  HENT:  Right Ear: Tympanic membrane and ear canal normal.  Left Ear: Tympanic membrane and ear canal normal.  Mouth/Throat: No posterior oropharyngeal edema or posterior oropharyngeal erythema.  Cardiovascular: Normal rate and regular rhythm.   No murmur heard. Pulmonary/Chest: Effort normal and breath sounds normal. No respiratory distress. She has no wheezes. She has no rales. She exhibits no tenderness.  Psychiatric: She has a normal mood and affect. Her behavior is normal. Judgment and thought content normal.          Assessment & Plan:   BP Readings from Last 3 Encounters:  08/16/11 126/82  08/14/11 138/90  05/21/11 105/58

## 2011-08-16 NOTE — Assessment & Plan Note (Signed)
Will treat with z-pak and prn tessalon.  Pt is instructed to contact us if her symptoms worsen or if she is not feeling better in 2-3 days.

## 2011-08-25 ENCOUNTER — Other Ambulatory Visit (HOSPITAL_COMMUNITY): Payer: BC Managed Care – PPO | Admitting: Radiology

## 2011-08-28 ENCOUNTER — Ambulatory Visit (HOSPITAL_COMMUNITY): Payer: BC Managed Care – PPO | Attending: Cardiology | Admitting: Radiology

## 2011-08-28 DIAGNOSIS — R072 Precordial pain: Secondary | ICD-10-CM

## 2011-08-28 DIAGNOSIS — R002 Palpitations: Secondary | ICD-10-CM | POA: Insufficient documentation

## 2011-08-28 DIAGNOSIS — R0989 Other specified symptoms and signs involving the circulatory and respiratory systems: Secondary | ICD-10-CM | POA: Insufficient documentation

## 2011-08-28 DIAGNOSIS — R0609 Other forms of dyspnea: Secondary | ICD-10-CM | POA: Insufficient documentation

## 2011-08-28 DIAGNOSIS — R000 Tachycardia, unspecified: Secondary | ICD-10-CM | POA: Insufficient documentation

## 2011-08-28 DIAGNOSIS — I10 Essential (primary) hypertension: Secondary | ICD-10-CM | POA: Insufficient documentation

## 2011-09-06 ENCOUNTER — Encounter: Payer: Self-pay | Admitting: Cardiovascular Disease

## 2011-09-06 ENCOUNTER — Ambulatory Visit (INDEPENDENT_AMBULATORY_CARE_PROVIDER_SITE_OTHER): Payer: BC Managed Care – PPO | Admitting: Cardiovascular Disease

## 2011-09-06 DIAGNOSIS — R0602 Shortness of breath: Secondary | ICD-10-CM

## 2011-09-06 NOTE — Assessment & Plan Note (Signed)
The patient's stress echocardiogram showed no ischemia. She has normal exercise tolerance, and normal hemodynamic response to exercise, and no significant arrhythmia. I think anxiety is the primary etiology of her symptoms. She clearly has a very high stress and anxiety level based on our discussion today. I reassured the patient about her cardiac status. She has had extensive testing including resting echocardiography, stress echocardiography, and outpatient telemetry. There has been no objective evidence of significant cardiac disease. Her blood pressure is reasonably controlled and I suspect this will improve as her anxiety is treated. I recommended an exercise program and consideration of alternative strategies to deal with her stress including yoga and meditation. She will followup with her primary care provider. I would be happy to see her back in the future as needed.

## 2011-09-06 NOTE — Progress Notes (Signed)
HPI:  40 year old woman presenting for followup evaluation. She was seen here about one month ago with recurrent chest pain. She said multiple cardiac symptoms including palpitations, chest pain, dyspnea with exertion. Stress echocardiogram was done that showed normal resting LV function and no significant ischemia with exertion.  I have reviewed her exercise echo in detail and she had a normal blood pressure response to exercise. She had normal exercise tolerance.  The patient's symptoms are unchanged in size seen her last period she brings in home blood pressure readings and she has occasional systolic blood pressures greater than 140, but her average readings are in the 120s and 130s over 70s to 80s.  Outpatient Encounter Prescriptions as of 09/06/2011  Medication Sig Dispense Refill  . ALPRAZolam (XANAX) 0.5 MG tablet Take 1 mg by mouth 3 (three) times daily as needed.        . benzonatate (TESSALON) 100 MG capsule Take 100 mg by mouth 2 (two) times daily as needed.       . hydrochlorothiazide (HYDRODIURIL) 25 MG tablet Take 25 mg by mouth daily.       . SUMAtriptan (IMITREX) 50 MG tablet Take 1 tab at start of headache, may repeat once 2 hours later if your symptoms return or do not improve.  15 tablet  0  . DISCONTD: Azilsartan-Chlorthalidone (EDARBYCLOR) 40-25 MG TABS Take 1 tablet by mouth daily.          Allergies  Allergen Reactions  . Amlodipine     Palpitations/shortness of breath    Past Medical History  Diagnosis Date  . Depression   . Hypertension   . Anemia     iron deficient, microcytic, hypochromic  . Anxiety   . Migraines   . 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 Fibroids     uterine  . Palpitations     recurrent  . Fatigue   . Tachycardia     unspecified  . Chest pain     atypical    ROS: Negative except as per HPI  BP 142/88  Pulse 102  Ht 5\' 4"  (1.626 m)  Wt 90.175 kg (198 lb 12.8 oz)  BMI 34.12 kg/m2  PHYSICAL EXAM: Pt is alert and oriented,  NAD HEENT: normal Neck: JVP - normal, carotids 2+= without bruits Lungs: CTA bilaterally CV: RRR without murmur or gallop Abd: soft, NT, Positive BS, no hepatomegaly Ext: no C/C/E, distal pulses intact and equal Skin: warm/dry no rash  ASSESSMENT AND PLAN:

## 2011-09-06 NOTE — Patient Instructions (Signed)
Your physician recommends that you schedule a follow-up appointment as needed.   Your physician recommends that you continue on your current medications as directed. Please refer to the Current Medication list given to you today.  

## 2011-09-25 ENCOUNTER — Ambulatory Visit (INDEPENDENT_AMBULATORY_CARE_PROVIDER_SITE_OTHER): Payer: BC Managed Care – PPO | Admitting: Family

## 2011-09-25 ENCOUNTER — Encounter: Payer: Self-pay | Admitting: Family

## 2011-09-25 VITALS — BP 144/70 | HR 78 | Temp 98.2°F | Resp 16 | Ht 65.25 in | Wt 192.0 lb

## 2011-09-25 DIAGNOSIS — F411 Generalized anxiety disorder: Secondary | ICD-10-CM

## 2011-09-25 DIAGNOSIS — N926 Irregular menstruation, unspecified: Secondary | ICD-10-CM

## 2011-09-25 DIAGNOSIS — L02229 Furuncle of trunk, unspecified: Secondary | ICD-10-CM

## 2011-09-25 DIAGNOSIS — N611 Abscess of the breast and nipple: Secondary | ICD-10-CM

## 2011-09-25 DIAGNOSIS — I10 Essential (primary) hypertension: Secondary | ICD-10-CM

## 2011-09-25 MED ORDER — ESCITALOPRAM OXALATE 5 MG PO TABS: 5.0000 mg | ORAL_TABLET | Freq: Every day | ORAL | Status: DC

## 2011-09-25 MED ORDER — CEPHALEXIN 500 MG PO CAPS: 500.0000 mg | ORAL_CAPSULE | Freq: Four times a day (QID) | ORAL | Status: AC

## 2011-09-25 NOTE — Patient Instructions (Signed)
Please follow up in 1 month.  

## 2011-09-25 NOTE — Assessment & Plan Note (Signed)
BP Readings from Last 3 Encounters:  09/25/11 144/70  09/06/11 142/88  08/16/11 126/82   BP control is reasonable.

## 2011-09-25 NOTE — Assessment & Plan Note (Signed)
Obtain CBC to evaluate her anemia in setting of ongoing blood loss.  I recommended that she arrange follow up with Dr. Juliene Pina.

## 2011-09-25 NOTE — Progress Notes (Signed)
Subjective:    Patient ID: Michele [name], female    DOB: 03-02-1971, 41 y.o.   MRN: [id number]  HPI  [name] is a 41 yr old female who presents today to discuss several concerns:  1) Anxiety/atypical CP- she has had an extensive cardiac work up and was evaluated by Dr. Excell Seltzer. Had neg stress echo- felt to be due to anxiety. She continues to have pain beneath the left breast. She has taken "more xanax" in the last 3 days than she usually takes in a 2 week period.  Has constant state of nervousness, heart beats fast. Seeing [facility]. Going once a week- just started.    2) Vaginal Bleeding- bleeding since July-  Had emergency D and C on 9/9.  Had iron infusion from Dr. Myna Hidalgo.   9/26- she had IUD placed by Dr. Juliene Pina.  She reports that she continues to bleed every day- ranges from spotting to heavy bleeding.   3) Skin lesion on breast- notes that it "came up a couple weeks ago." Tender to the touch.   Review of Systems    see HPI  Past Medical History  Diagnosis Date  . Depression   . Hypertension   . Anemia     iron deficient, microcytic, hypochromic  . Anxiety   . Migraines   . 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 Fibroids     uterine  . Palpitations     recurrent  . Fatigue   . Tachycardia     unspecified  . Chest pain     atypical    History   Social History  . Marital Status: Married    Spouse Name: [name]    Number of Children: 2  . Years of Education: N/A   Occupational History  . 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 Smoking status: Never Smoker   . Smokeless tobacco: Not on file  . Alcohol Use: Yes     Once a month  . Drug Use: No  . Sexually Active: Yes    Birth Control/ Protection: None   Other Topics Concern  . Not on file   Social History Narrative   Regular exercise: yesDaily caffeine: 1-2 daily    Past Surgical History  Procedure Date  . Appendectomy 03/2009  . Tubal  ligation 1997  . Tuboplasty / tubotubal anastomosis 2005  . Biopsy thyroid November 28, 2009    Dr. Allena Katz- endo  . Dilation and curettage of uterus 05/21/2011    Procedure: DILATATION AND CURETTAGE (D&C);  Surgeon: Robley Fries;  Location: [facility];  Service: Gynecology;  Laterality: N/A;  dilitation and currettage/endometrial currettings    Family History  Problem Relation Age of Onset  . Hypertension Mother   . Cancer Mother 27    breast  . Cancer Father     colon  . Stroke Brother     handicapped due to complications from spinal meningitis  . Seizures Brother   . Asthma Son   . Arthritis Maternal Grandmother   . Hypertension Maternal Grandmother   . Stroke Maternal Grandfather     Allergies  Allergen Reactions  . Amlodipine     Palpitations/shortness of breath    Current Outpatient Prescriptions on File Prior to Visit  Medication Sig Dispense Refill  . ALPRAZolam (XANAX) 0.5 MG tablet Take 1 mg by mouth 3 (three) times daily as needed.        Marland Kitchen  benzonatate (TESSALON) 100 MG capsule Take 100 mg by mouth 2 (two) times daily as needed.       . hydrochlorothiazide (HYDRODIURIL) 25 MG tablet Take 25 mg by mouth daily.       . SUMAtriptan (IMITREX) 50 MG tablet Take 1 tab at start of headache, may repeat once 2 hours later if your symptoms return or do not improve.  15 tablet  0    BP 144/70  Pulse 78  Temp(Src) 98.2 F (36.8 C) (Oral)  Resp 16  Ht 5' 5.25" (1.657 m)  Wt 192 lb (87.091 kg)  BMI 31.71 kg/m2    Objective:   Physical Exam  Constitutional: She is oriented to person, place, and time. She appears well-developed and well-nourished. No distress.  HENT:  Head: Normocephalic and atraumatic.  Eyes: Conjunctivae are normal. Pupils are equal, round, and reactive to light.  Cardiovascular: Normal rate and regular rhythm.   No murmur heard. Pulmonary/Chest: Effort normal and breath sounds normal. No respiratory distress. She has no wheezes. She has no rales.    Musculoskeletal: She exhibits no edema.  Lymphadenopathy:    She has no cervical adenopathy.  Neurological: She is alert and oriented to person, place, and time.  Skin: Skin is warm and dry.       Firm tender pea sized boil noted lower portion ofright breast- skin is hyperpigmented. No drainage or surrounding erythema/warmth.  Psychiatric: Her speech is normal and behavior is normal. Judgment and thought content normal. Her mood appears anxious. She does not exhibit a depressed mood.          Assessment & Plan:  30 minutes spent with pt today.  >50% of this time was spent counseling pt on her anxiety/depression.

## 2011-09-25 NOTE — Assessment & Plan Note (Signed)
Deteriorated.  She is reluctant to resume effexor or cymbalta as these raised her BP.  She has tried viibryd but tells me that she did not tolerate due to "chest pain." Will plan to start low dose of lexapro with close monitoring of her weight.

## 2011-09-25 NOTE — Assessment & Plan Note (Signed)
Small, will treat with keflex. She is instructed to call us if it worsens or if it does not improve.

## 2011-09-26 ENCOUNTER — Encounter: Payer: Self-pay | Admitting: Family

## 2011-09-26 LAB — CBC WITH DIFFERENTIAL/PLATELET
Basophils Absolute: 0 10*3/uL (ref 0.0–0.1)
Eosinophils Relative: 1 % (ref 0–5)
Lymphocytes Relative: 31 % (ref 12–46)
MCV: 84.1 fL (ref 78.0–100.0)
Neutrophils Relative %: 61 % (ref 43–77)
Platelets: 231 10*3/uL (ref 150–400)
RDW: 15 % (ref 11.5–15.5)
WBC: 8.2 10*3/uL (ref 4.0–10.5)

## 2011-09-29 ENCOUNTER — Telehealth: Payer: Self-pay | Admitting: Family

## 2011-09-29 NOTE — Telephone Encounter (Signed)
Pt.notified

## 2011-09-29 NOTE — Telephone Encounter (Signed)
Patient is requesting lab results.

## 2011-10-13 ENCOUNTER — Ambulatory Visit (INDEPENDENT_AMBULATORY_CARE_PROVIDER_SITE_OTHER): Payer: BC Managed Care – PPO | Admitting: Family

## 2011-10-13 ENCOUNTER — Encounter: Payer: Self-pay | Admitting: Family

## 2011-10-13 VITALS — BP 138/90 | HR 88 | Temp 98.7°F | Resp 16 | Wt 183.0 lb

## 2011-10-13 DIAGNOSIS — F341 Dysthymic disorder: Secondary | ICD-10-CM

## 2011-10-13 DIAGNOSIS — F418 Other specified anxiety disorders: Secondary | ICD-10-CM

## 2011-10-13 MED ORDER — DIAZEPAM 2 MG PO TABS: 2.0000 mg | ORAL_TABLET | Freq: Four times a day (QID) | ORAL | Status: DC | PRN

## 2011-10-13 MED ORDER — DIAZEPAM 2 MG PO TABS: 2.0000 mg | ORAL_TABLET | Freq: Two times a day (BID) | ORAL | Status: AC

## 2011-10-13 MED ORDER — BUPROPION HCL ER (XL) 150 MG PO TB24: 300.0000 mg | ORAL_TABLET | Freq: Every day | ORAL | Status: DC

## 2011-10-13 NOTE — Progress Notes (Signed)
Subjective:    Patient ID: Michele [name], female    DOB: [birth year], 41 y.o.   MRN: [id number]  HPI  [name] is a 41 yr old female who presents today for follow up of her anxiety.  Last visit she was started on lexapro. She continued this medication for only 3 days and discontinued due to worsening palpitations.  She reports that her panic attacks have been more severe. Seem the worst when she goes to work.  She reports feeling "hopeless" and down.  Feels frustrated with how she feels.  She has been using alprazolam 1/2 tablet TID.  She now has an IUD per GYN and reports that a hysterectomy is planned.   Review of Systems See HPI  Past Medical History  Diagnosis Date  . Depression   . Hypertension   . Anemia     iron deficient, microcytic, hypochromic  . Anxiety   . Migraines   . 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 Fibroids     uterine  . Palpitations     recurrent  . Fatigue   . Tachycardia     unspecified  . Chest pain     atypical    History   Social History  . Marital Status: Married    Spouse Name: [name]    Number of Children: 2  . Years of Education: N/A   Occupational History  . 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 Smoking status: Never Smoker   . Smokeless tobacco: Not on file  . Alcohol Use: Yes     Once a month  . Drug Use: No  . Sexually Active: Yes    Birth Control/ Protection: None   Other Topics Concern  . Not on file   Social History Narrative   Regular exercise: yesDaily caffeine: 1-2 daily    Past Surgical History  Procedure Date  . Appendectomy 03/2009  . Tubal ligation 1997  . Tuboplasty / tubotubal anastomosis 2005  . Biopsy thyroid November 28, 2009    Dr. Allena Katz- endo  . Dilation and curettage of uterus 05/21/2011    Procedure: DILATATION AND CURETTAGE (D&C);  Surgeon: Robley Fries;  Location: [facility];  Service: Gynecology;  Laterality: N/A;  dilitation and currettage/endometrial  currettings    Family History  Problem Relation Age of Onset  . Hypertension Mother   . Cancer Mother 69    breast  . Cancer Father     colon  . Stroke Brother     handicapped due to complications from spinal meningitis  . Seizures Brother   . Asthma Son   . Arthritis Maternal Grandmother   . Hypertension Maternal Grandmother   . Stroke Maternal Grandfather     Allergies  Allergen Reactions  . Amlodipine     Palpitations/shortness of breath    Current Outpatient Prescriptions on File Prior to Visit  Medication Sig Dispense Refill  . benzonatate (TESSALON) 100 MG capsule Take 100 mg by mouth 2 (two) times daily as needed.       . hydrochlorothiazide (HYDRODIURIL) 25 MG tablet Take 25 mg by mouth daily.       . SUMAtriptan (IMITREX) 50 MG tablet Take 1 tab at start of headache, may repeat once 2 hours later if your symptoms return or do not improve.  15 tablet  0    BP 138/90  Pulse 88  Temp(Src) 98.7 F (37.1 C) (Oral)  Resp 16  Wt 183 lb (83.008 kg)  SpO2 99%       Objective:   Physical Exam  Constitutional: She appears well-developed and well-nourished. No distress.  Pulmonary/Chest: Effort normal and breath sounds normal.  Psychiatric: Her speech is normal and behavior is normal. Judgment and thought content normal. Her mood appears anxious. Cognition and memory are normal. She exhibits a depressed mood.          Assessment & Plan:  25 minutes spent with pt.  >50% of this time was spent counseling pt on her anxiety and depression.

## 2011-10-13 NOTE — Assessment & Plan Note (Addendum)
Will try wellbutrin for her depression.  Will also plan to switch her from xanax to standing valium to start this weekend.  She is to continue working with a therapist. Follow up in 1 month.

## 2011-10-13 NOTE — Patient Instructions (Signed)
Start Wellbutrin one tablet daily.  After 3 days, you may increase to two tabs as tolerated.  Valium 2mg - please start 1/2 tab twice daily and work you way up to 1 tab twice daily as tolerated.  Follow up in 1 month.

## 2011-10-17 ENCOUNTER — Ambulatory Visit: Payer: BC Managed Care – PPO | Admitting: Family

## 2011-10-30 ENCOUNTER — Ambulatory Visit (INDEPENDENT_AMBULATORY_CARE_PROVIDER_SITE_OTHER): Payer: BC Managed Care – PPO | Admitting: Family

## 2011-10-30 ENCOUNTER — Encounter: Payer: Self-pay | Admitting: Family

## 2011-10-30 VITALS — BP 142/92 | HR 90 | Temp 98.0°F | Resp 16 | Wt 186.1 lb

## 2011-10-30 DIAGNOSIS — F341 Dysthymic disorder: Secondary | ICD-10-CM

## 2011-10-30 DIAGNOSIS — G4733 Obstructive sleep apnea (adult) (pediatric): Secondary | ICD-10-CM

## 2011-10-30 DIAGNOSIS — F418 Other specified anxiety disorders: Secondary | ICD-10-CM

## 2011-10-30 DIAGNOSIS — M940 Chondrocostal junction syndrome [Tietze]: Secondary | ICD-10-CM

## 2011-10-30 DIAGNOSIS — R609 Edema, unspecified: Secondary | ICD-10-CM

## 2011-10-30 MED ORDER — FUROSEMIDE 20 MG PO TABS: 20.0000 mg | ORAL_TABLET | Freq: Every day | ORAL | Status: DC

## 2011-10-30 NOTE — Progress Notes (Signed)
Subjective:    Patient ID: Michele [name], female    DOB: April 28, 1971, 41 y.o.   MRN: [id number]  HPI  Pt is a 41 yr old female who presents today with several concerns.  Friday night BP was high, reports that her HR was irregular.  Reports that she had a raised bruise on her left forearm.  Yesterday she notes a bruise in her L AC fossa.  Notes associated tenderness.  Notes associated chest tenderness to palpation- this started on Saturday AM when she woke up.    Depression/anxiety- reports that the wellbutrin "did not work."  Reports that she felt light headed, sick to her stomach and slept at work for 1 hr and 20 minutes.  Notes that anxiety is better.  She has been working out and has lost 18 pounds.  OSA- reports that she is wearing her CPAP ([facility]).  Testing and CPAP was initiated by Dr. Arvella Nigh. She has never seen pulmonology.   Edema- She brings with her some pictures on her phone of her LE edema.  Significant edema is noted in these pictures.  Notes that the edema is intermittent and not bothering her at this time.   Review of Systems See HPI  Past Medical History  Diagnosis Date  . Depression   . Hypertension   . Anemia     iron deficient, microcytic, hypochromic  . Anxiety   . Migraines   . 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 Fibroids     uterine  . Palpitations     recurrent  . Fatigue   . Tachycardia     unspecified  . Chest pain     atypical    History   Social History  . Marital Status: Married    Spouse Name: [name]    Number of Children: 2  . Years of Education: N/A   Occupational History  . 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 Smoking status: Never Smoker   . Smokeless tobacco: Not on file  . Alcohol Use: Yes     Once a month  . Drug Use: No  . Sexually Active: Yes    Birth Control/ Protection: None   Other Topics Concern  . Not on file   Social History Narrative   Regular exercise: yesDaily caffeine: 1-2 daily    Past Surgical History  Procedure Date  . Appendectomy 03/2009  . Tubal ligation 1997  . Tuboplasty / tubotubal anastomosis 2005  . Biopsy thyroid November 28, 2009    Dr. Allena Katz- endo  . Dilation and curettage of uterus 05/21/2011    Procedure: DILATATION AND CURETTAGE (D&C);  Surgeon: Robley Fries;  Location: [facility];  Service: Gynecology;  Laterality: N/A;  dilitation and currettage/endometrial currettings    Family History  Problem Relation Age of Onset  . Hypertension Mother   . Cancer Mother 70    breast  . Cancer Father     colon  . Stroke Brother     handicapped due to complications from spinal meningitis  . Seizures Brother   . Asthma Son   . Arthritis Maternal Grandmother   . Hypertension Maternal Grandmother   . Stroke Maternal Grandfather     Allergies  Allergen Reactions  . Amlodipine     Palpitations/shortness of breath  . Wellbutrin (Bupropion Hcl) Other (See Comments)    Dizziness, increased anxiety    Current Outpatient Prescriptions on  File Prior to Visit  Medication Sig Dispense Refill  . SUMAtriptan (IMITREX) 50 MG tablet Take 1 tab at start of headache, may repeat once 2 hours later if your symptoms return or do not improve.  15 tablet  0  . benzonatate (TESSALON) 100 MG capsule Take 100 mg by mouth 2 (two) times daily as needed.       Marland Kitchen buPROPion (WELLBUTRIN XL) 150 MG 24 hr tablet Take 2 tablets (300 mg total) by mouth daily.  60 tablet  1    BP 142/92  Pulse 90  Temp(Src) 98 F (36.7 C) (Oral)  Resp 16  Wt 186 lb 1.9 oz (84.423 kg)  SpO2 96%       Objective:   Physical Exam  Constitutional: She is oriented to person, place, and time. She appears well-developed and well-nourished. No distress.  HENT:  Head: Normocephalic and atraumatic.  Mouth/Throat: No oropharyngeal exudate.  Cardiovascular: Normal rate and regular rhythm.   No murmur heard. Pulmonary/Chest: Effort normal and breath  sounds normal. No respiratory distress. She has no wheezes. She has no rales. She exhibits no tenderness.  Musculoskeletal: She exhibits edema.       Chest wall- + tenderness to palpation.   Neurological: She is alert and oriented to person, place, and time. No cranial nerve deficit. She exhibits normal muscle tone. Coordination normal.  Skin: Skin is warm and dry. No erythema.       No swelling or ecchymosis noted on left forearm.  Psychiatric: She has a normal mood and affect. Her behavior is normal. Judgment and thought content normal.          Assessment & Plan:

## 2011-10-30 NOTE — Patient Instructions (Addendum)
You may use Aleve 220mg  twice daily for the next few days.   Please follow up in 2 weeks, sooner if problems/concerns.

## 2011-10-31 DIAGNOSIS — M940 Chondrocostal junction syndrome [Tietze]: Secondary | ICD-10-CM | POA: Insufficient documentation

## 2011-10-31 DIAGNOSIS — R609 Edema, unspecified: Secondary | ICD-10-CM | POA: Insufficient documentation

## 2011-10-31 DIAGNOSIS — G4733 Obstructive sleep apnea (adult) (pediatric): Secondary | ICD-10-CM | POA: Insufficient documentation

## 2011-10-31 NOTE — Assessment & Plan Note (Signed)
Will add furosemide prn for edema.  No significant edema is noted today, but the images she brings with her do show significant LE edema.

## 2011-10-31 NOTE — Assessment & Plan Note (Addendum)
She did not tolerate Wellbutrin. I think that she has a significant amount of anxiety dealing with the idea of being treated with medication. She wishes to return to xanax in place of valium as she found that the xanax helps her more.  At this time her symptoms seem to be improving overall.  I recommended that she continue prn xanax only for now.  Also encouraged her to continue exercise as well as this seems to be helping her anxiety as well.

## 2011-10-31 NOTE — Assessment & Plan Note (Signed)
41 yr old female, new to CPAP, not noticing significant improvement in daytime energy.  Will refer to Dr. Vassie Loll for further evaluation.

## 2011-10-31 NOTE — Assessment & Plan Note (Signed)
I recommended the use of aleve for the next few days.

## 2011-11-06 ENCOUNTER — Telehealth: Payer: Self-pay | Admitting: Cardiovascular Disease

## 2011-11-06 NOTE — Telephone Encounter (Signed)
Pt's voicemail will not accept messages.

## 2011-11-06 NOTE — Telephone Encounter (Signed)
New msg Pt has been having some sob, fluid retention and chest pain that comes and goes over the last week. Please call

## 2011-11-07 NOTE — Telephone Encounter (Signed)
Pt is having chest pain, shoulder pain and SOB.  I offered the pt an appointment with the PA this week but she refused.  The pt said she only wants to see Dr Excell Seltzer and I made her aware that his first available is 12/11/11.  The pt scheduled an appointment on this day.  I instructed the pt to call the office if she had any worsening of symptoms or go to the ER for further evaluation. Pt agreed with plan.

## 2011-11-13 ENCOUNTER — Ambulatory Visit (INDEPENDENT_AMBULATORY_CARE_PROVIDER_SITE_OTHER): Payer: BC Managed Care – PPO | Admitting: Family

## 2011-11-13 ENCOUNTER — Ambulatory Visit: Payer: BC Managed Care – PPO | Admitting: Family

## 2011-11-13 ENCOUNTER — Encounter: Payer: Self-pay | Admitting: Family

## 2011-11-13 VITALS — BP 150/94 | HR 94 | Temp 97.4°F | Resp 16 | Wt 185.1 lb

## 2011-11-13 DIAGNOSIS — I10 Essential (primary) hypertension: Secondary | ICD-10-CM

## 2011-11-13 DIAGNOSIS — F419 Anxiety disorder, unspecified: Secondary | ICD-10-CM

## 2011-11-13 DIAGNOSIS — R609 Edema, unspecified: Secondary | ICD-10-CM

## 2011-11-13 DIAGNOSIS — F411 Generalized anxiety disorder: Secondary | ICD-10-CM

## 2011-11-13 DIAGNOSIS — F32A Depression, unspecified: Secondary | ICD-10-CM

## 2011-11-13 DIAGNOSIS — F329 Major depressive disorder, single episode, unspecified: Secondary | ICD-10-CM

## 2011-11-13 DIAGNOSIS — F341 Dysthymic disorder: Secondary | ICD-10-CM

## 2011-11-13 MED ORDER — LOSARTAN POTASSIUM 50 MG PO TABS: 50.0000 mg | ORAL_TABLET | Freq: Every day | ORAL | Status: DC

## 2011-11-13 NOTE — Patient Instructions (Addendum)
Please keep your apt with Dr. Excell Seltzer. Follow up in 1 month. You will be contacted about your referral to psychiatry. Complete your blood work prior to leaving.

## 2011-11-13 NOTE — Assessment & Plan Note (Signed)
Deteriorated. She refuses a beta blocker which I think would benefit her pressure as well as her palpitations. Lisinopril causes cough- will try cozaar.

## 2011-11-13 NOTE — Progress Notes (Signed)
Subjective:    Patient ID: Michele [name], female    DOB: 1971/01/06, 41 y.o.   MRN: [id number]  HPI]  [name] is a 41 yr old female who presents today for follow up.  Anxiety- run/flight improved. She is using xanax tid, feels that she is non-functioning on the weekend.  Has had some   Chest pain- has apt with Dr. Excell Seltzer 4/1, has sob, left arm numbness, and LE edema.  Has had a negative cardiac workup.  No chest pain at present.  Edema- notes that she continues to have some edema despite use of Furosemide.    Review of Systems See HPI  Past Medical History  Diagnosis Date  . Depression   . Hypertension   . Anemia     iron deficient, microcytic, hypochromic  . Anxiety   . Migraines   . 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 Fibroids     uterine  . Palpitations     recurrent  . Fatigue   . Tachycardia     unspecified  . Chest pain     atypical    History   Social History  . Marital Status: Married    Spouse Name: [name]    Number of Children: 2  . Years of Education: N/A   Occupational History  . 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 Smoking status: Never Smoker   . Smokeless tobacco: Not on file  . Alcohol Use: Yes     Once a month  . Drug Use: No  . Sexually Active: Yes    Birth Control/ Protection: None   Other Topics Concern  . Not on file   Social History Narrative   Regular exercise: yesDaily caffeine: 1-2 daily    Past Surgical History  Procedure Date  . Appendectomy 03/2009  . Tubal ligation 1997  . Tuboplasty / tubotubal anastomosis 2005  . Biopsy thyroid November 28, 2009    Dr. Allena Katz- endo  . Dilation and curettage of uterus 05/21/2011    Procedure: DILATATION AND CURETTAGE (D&C);  Surgeon: Robley Fries;  Location: [facility];  Service: Gynecology;  Laterality: N/A;  dilitation and currettage/endometrial currettings    Family History  Problem Relation Age of Onset  . Hypertension Mother   .  Cancer Mother 63    breast  . Cancer Father     colon  . Stroke Brother     handicapped due to complications from spinal meningitis  . Seizures Brother   . Asthma Son   . Arthritis Maternal Grandmother   . Hypertension Maternal Grandmother   . Stroke Maternal Grandfather     Allergies  Allergen Reactions  . Amlodipine     Palpitations/shortness of breath  . Wellbutrin (Bupropion Hcl) Other (See Comments)    Dizziness, increased anxiety    Current Outpatient Prescriptions on File Prior to Visit  Medication Sig Dispense Refill  . ALPRAZolam (XANAX) 1 MG tablet Take 1 mg by mouth 3 (three) times daily as needed.      Marland Kitchen buPROPion (WELLBUTRIN XL) 150 MG 24 hr tablet Take 2 tablets (300 mg total) by mouth daily.  60 tablet  1  . furosemide (LASIX) 20 MG tablet Take 1 tablet (20 mg total) by mouth daily.  30 tablet  3  . SUMAtriptan (IMITREX) 50 MG tablet Take 1 tab at start of headache, may repeat once 2 hours later if your symptoms return or do not  improve.  15 tablet  0  . benzonatate (TESSALON) 100 MG capsule Take 100 mg by mouth 2 (two) times daily as needed.         BP 150/94  Pulse 94  Temp(Src) 97.4 F (36.3 C) (Oral)  Resp 16  Wt 185 lb 0.8 oz (83.938 kg)  SpO2 99%       Objective:   Physical Exam  Constitutional: She is oriented to person, place, and time. She appears well-developed and well-nourished.  Cardiovascular: Normal rate and regular rhythm.   No murmur heard. Pulmonary/Chest: Effort normal and breath sounds normal. No respiratory distress. She has no wheezes. She has no rales. She exhibits no tenderness.  Musculoskeletal: She exhibits no edema.       1+ bilateral LE edema.   Neurological: She is alert and oriented to person, place, and time.  Psychiatric: Her speech is normal and behavior is normal. Judgment and thought content normal. Her mood appears anxious. Her affect is not angry, not blunt, not labile and not inappropriate. She does not exhibit a  depressed mood.          Assessment & Plan:   BP Readings from Last 3 Encounters:  11/13/11 150/94  10/30/11 142/92  10/13/11 138/90

## 2011-11-13 NOTE — Assessment & Plan Note (Signed)
Could be related to elevated blood pressure or venous insufficiency. She has had a normal echo.  Recommended that she continue lasix.

## 2011-11-13 NOTE — Assessment & Plan Note (Signed)
Some improvement, but she notes that she is too tired with chronic benzo to be able to have a good quality of life.  I spoke with pt today about needing to see psychiatry.  She is hesitant but agreeable.  I believe that her anxiety continues to play a role in many of her somatic complaints.

## 2011-11-14 ENCOUNTER — Encounter: Payer: Self-pay | Admitting: Family

## 2011-11-14 LAB — BASIC METABOLIC PANEL
Calcium: 9.4 mg/dL (ref 8.4–10.5)
Glucose, Bld: 74 mg/dL (ref 70–99)
Potassium: 4.8 mEq/L (ref 3.5–5.3)
Sodium: 136 mEq/L (ref 135–145)

## 2011-11-15 ENCOUNTER — Telehealth: Payer: Self-pay | Admitting: Family

## 2011-11-15 NOTE — Telephone Encounter (Signed)
Myriam Jacobson, could you please try Dr. Evelene Croon in [place] and let pt know?

## 2011-11-15 NOTE — Telephone Encounter (Signed)
Patient states that we are referring her out for psychiatry. She states that where we are referring her to requires $150 upfront, just for making the appt. She wants [name] to refer her somewhere else that does not require that.

## 2011-11-16 NOTE — Telephone Encounter (Signed)
Called Dr  Evelene Croon , pt will call to make appointment

## 2011-11-18 ENCOUNTER — Emergency Department (INDEPENDENT_AMBULATORY_CARE_PROVIDER_SITE_OTHER): Payer: BC Managed Care – PPO

## 2011-11-18 ENCOUNTER — Emergency Department (HOSPITAL_BASED_OUTPATIENT_CLINIC_OR_DEPARTMENT_OTHER)
Admission: EM | Admit: 2011-11-18 | Discharge: 2011-11-18 | Disposition: A | Discharge status: Home / Self Care | Payer: BC Managed Care – PPO | Attending: Emergency Medicine | Admitting: Emergency Medicine

## 2011-11-18 ENCOUNTER — Encounter (HOSPITAL_BASED_OUTPATIENT_CLINIC_OR_DEPARTMENT_OTHER): Payer: Self-pay | Admitting: *Deleted

## 2011-11-18 ENCOUNTER — Other Ambulatory Visit: Payer: Self-pay

## 2011-11-18 DIAGNOSIS — R079 Chest pain, unspecified: Secondary | ICD-10-CM

## 2011-11-18 DIAGNOSIS — R209 Unspecified disturbances of skin sensation: Secondary | ICD-10-CM

## 2011-11-18 DIAGNOSIS — I10 Essential (primary) hypertension: Secondary | ICD-10-CM | POA: Insufficient documentation

## 2011-11-18 DIAGNOSIS — F329 Major depressive disorder, single episode, unspecified: Secondary | ICD-10-CM | POA: Insufficient documentation

## 2011-11-18 DIAGNOSIS — M79609 Pain in unspecified limb: Secondary | ICD-10-CM

## 2011-11-18 DIAGNOSIS — K449 Diaphragmatic hernia without obstruction or gangrene: Secondary | ICD-10-CM

## 2011-11-18 DIAGNOSIS — F3289 Other specified depressive episodes: Secondary | ICD-10-CM | POA: Insufficient documentation

## 2011-11-18 DIAGNOSIS — R0602 Shortness of breath: Secondary | ICD-10-CM | POA: Insufficient documentation

## 2011-11-18 DIAGNOSIS — F419 Anxiety disorder, unspecified: Secondary | ICD-10-CM

## 2011-11-18 DIAGNOSIS — F411 Generalized anxiety disorder: Secondary | ICD-10-CM | POA: Insufficient documentation

## 2011-11-18 LAB — URINALYSIS, ROUTINE W REFLEX MICROSCOPIC
Bilirubin Urine: NEGATIVE
Ketones, ur: NEGATIVE mg/dL
Nitrite: NEGATIVE
Protein, ur: NEGATIVE mg/dL
Urobilinogen, UA: 0.2 mg/dL (ref 0.0–1.0)

## 2011-11-18 LAB — DIFFERENTIAL
Basophils Absolute: 0 10*3/uL (ref 0.0–0.1)
Eosinophils Relative: 0 % (ref 0–5)
Lymphocytes Relative: 11 % — ABNORMAL LOW (ref 12–46)
Lymphs Abs: 0.8 10*3/uL (ref 0.7–4.0)
Neutrophils Relative %: 85 % — ABNORMAL HIGH (ref 43–77)

## 2011-11-18 LAB — CBC
MCV: 85.1 fL (ref 78.0–100.0)
Platelets: DECREASED 10*3/uL (ref 150–400)
RBC: 4.83 MIL/uL (ref 3.87–5.11)
RDW: 13.2 % (ref 11.5–15.5)
WBC: 7.8 10*3/uL (ref 4.0–10.5)

## 2011-11-18 LAB — COMPREHENSIVE METABOLIC PANEL
ALT: 13 U/L (ref 0–35)
AST: 18 U/L (ref 0–37)
Albumin: 3.9 g/dL (ref 3.5–5.2)
CO2: 25 mEq/L (ref 19–32)
Calcium: 9.2 mg/dL (ref 8.4–10.5)
Chloride: 105 mEq/L (ref 96–112)
Creatinine, Ser: 0.7 mg/dL (ref 0.50–1.10)
Sodium: 139 mEq/L (ref 135–145)
Total Bilirubin: 0.6 mg/dL (ref 0.3–1.2)

## 2011-11-18 LAB — CARDIAC PANEL(CRET KIN+CKTOT+MB+TROPI)
CK, MB: 4.3 ng/mL — ABNORMAL HIGH (ref 0.3–4.0)
CK, MB: 4 ng/mL (ref 0.3–4.0)
Relative Index: 1.5 (ref 0.0–2.5)
Relative Index: 1.6 (ref 0.0–2.5)
Total CK: 249 U/L — ABNORMAL HIGH (ref 7–177)
Troponin I: <0.3 ng/mL (ref <0.30)
Troponin I: <0.3 ng/mL (ref <0.30)

## 2011-11-18 LAB — PROTIME-INR: INR: 0.96 (ref 0.00–1.49)

## 2011-11-18 LAB — D-DIMER, QUANTITATIVE: D-Dimer, Quant: <0.22 ug/mL-FEU (ref 0.00–0.48)

## 2011-11-18 MED ORDER — IOHEXOL 350 MG/ML SOLN
100.0000 mL | Freq: Once | INTRAVENOUS | Status: AC | PRN
  Administered 2011-11-18: 100 mL via INTRAVENOUS

## 2011-11-18 MED ORDER — KETOROLAC TROMETHAMINE 30 MG/ML IJ SOLN
30.0000 mg | Freq: Once | INTRAMUSCULAR | Status: AC
  Administered 2011-11-18: 30 mg via INTRAVENOUS
  Filled 2011-11-18: qty 1

## 2011-11-18 NOTE — ED Notes (Signed)
Pt states she has a hx of HTN and began having CP and left arm tingling this a.m. Tip of tongue numb. Also has hx of panic attacks. Recently changed BP meds on Monday and has not felt well since.

## 2011-11-18 NOTE — ED Provider Notes (Addendum)
History     CSN: [id number]  Arrival date & time 11/18/11  1256   First MD Initiated Contact with Patient 11/18/11 1318      Chief Complaint  Patient presents with  . Chest Pain    (Consider location/radiation/quality/duration/timing/severity/associated sxs/prior treatment) HPI Comments: Patient awoke this morning with left-sided chest pain it radiates underneath her breast it is worse with inspiration. She also developed some left arm tingling and weakness in the arm. Is going on since she woke up at about 9:00. She was last seen normal last night. She denies any facial droop, difficulty speaking, difficulty walking, leg pain or weakness. She endorses some shortness of breath and nausea. She has a history of chronic chest tightness and evaluated by cardiology but this pain is different. Negative stress echo last December. She fell she didn't have any chest pain or shortness sleep.  The history is provided by the patient.    Past Medical History  Diagnosis Date  . Depression   . Hypertension   . Anemia     iron deficient, microcytic, hypochromic  . Anxiety   . Migraines   . 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 Fibroids     uterine  . Palpitations     recurrent  . Fatigue   . Tachycardia     unspecified  . Chest pain     atypical    Past Surgical History  Procedure Date  . Appendectomy 03/2009  . Tubal ligation 1997  . Tuboplasty / tubotubal anastomosis 2005  . Biopsy thyroid November 28, 2009    Dr. Allena Katz- endo  . Dilation and curettage of uterus 05/21/2011    Procedure: DILATATION AND CURETTAGE (D&C);  Surgeon: Robley Fries;  Location: [facility];  Service: Gynecology;  Laterality: N/A;  dilitation and currettage/endometrial currettings    Family History  Problem Relation Age of Onset  . Hypertension Mother   . Cancer Mother 35    breast  . Cancer Father     colon  . Stroke Brother     handicapped due to complications from spinal meningitis  . Seizures Brother     . Asthma Son   . Arthritis Maternal Grandmother   . Hypertension Maternal Grandmother   . Stroke Maternal Grandfather     History  Substance Use Topics  . Smoking status: Never Smoker   . Smokeless tobacco: Not on file  . Alcohol Use: Yes     Once a month    OB History    Grav Para Term Preterm Abortions TAB SAB Ect Mult Living   4 2 1 1 2 1  1  2       Review of Systems  Constitutional: Negative for fever, activity change and appetite change.  HENT: Negative for congestion.   Eyes: Negative for visual disturbance.  Respiratory: Positive for chest tightness and shortness of breath.   Cardiovascular: Positive for chest pain.  Gastrointestinal: Negative for nausea, vomiting and abdominal pain.  Genitourinary: Negative for dysuria and hematuria.  Musculoskeletal: Negative for back pain.  Skin: Negative for rash.  Neurological: Positive for weakness. Negative for facial asymmetry, speech difficulty and headaches.    Allergies  Amlodipine and Wellbutrin  Home Medications   Current Outpatient Rx  Name Route Sig Dispense Refill  . ALPRAZOLAM 1 MG PO TABS Oral Take 1 mg by mouth 3 (three) times daily as needed.    Marland Kitchen BENZONATATE 100 MG PO CAPS Oral Take 100 mg by  mouth 2 (two) times daily as needed.     . BUPROPION HCL ER (XL) 150 MG PO TB24 Oral Take 2 tablets (300 mg total) by mouth daily. 60 tablet 1  . FUROSEMIDE 20 MG PO TABS Oral Take 1 tablet (20 mg total) by mouth daily. 30 tablet 3  . LOSARTAN POTASSIUM 50 MG PO TABS Oral Take 1 tablet (50 mg total) by mouth daily. 30 tablet 0  . SUMATRIPTAN SUCCINATE 50 MG PO TABS  Take 1 tab at start of headache, may repeat once 2 hours later if your symptoms return or do not improve. 15 tablet 0    BP 168/89  Pulse 146  Temp(Src) 97.5 F (36.4 C) (Oral)  Resp 20  Ht 5\' 4"  (1.626 m)  Wt 186 lb (84.369 kg)  BMI 31.93 kg/m2  SpO2 100%  Physical Exam  Constitutional: She is oriented to person, place, and time. She appears  well-developed and well-nourished. No distress.  HENT:  Head: Normocephalic and atraumatic.  Mouth/Throat: Oropharynx is clear and moist. No oropharyngeal exudate.  Eyes: Conjunctivae and EOM are normal. Pupils are equal, round, and reactive to light.  Neck: Normal range of motion. Neck supple.  Cardiovascular: Normal rate and normal heart sounds.   Pulmonary/Chest: Effort normal and breath sounds normal. No respiratory distress.  Abdominal: Soft. There is no tenderness. There is no rebound and no guarding.  Neurological: She is alert and oriented to person, place, and time. No cranial nerve deficit.       Cranial nerves II through XII intact, left arm with weak grip strength compared to right. 4-5 strength on Left versus 5 out of 5 strength in the right. 5 Out of 5 strength lower extremities. No pronator drift, no ataxia on finger to nose. No nystagmus  Skin: Skin is warm.    ED Course  Procedures (including critical care time)  Labs Reviewed  DIFFERENTIAL - Abnormal; Notable for the following:    Neutrophils Relative 85 (*)    Lymphocytes Relative 11 (*)    All other components within normal limits  COMPREHENSIVE METABOLIC PANEL - Abnormal; Notable for the following:    Glucose, Bld 104 (*)    BUN 5 (*)    All other components within normal limits  CARDIAC PANEL(CRET KIN+CKTOT+MB+TROPI) - Abnormal; Notable for the following:    Total CK 284 (*)    CK, MB 4.3 (*)    All other components within normal limits  URINALYSIS, ROUTINE W REFLEX MICROSCOPIC - Abnormal; Notable for the following:    Hgb urine dipstick SMALL (*)    All other components within normal limits  URINE MICROSCOPIC-ADD ON - Abnormal; Notable for the following:    Bacteria, UA FEW (*)    All other components within normal limits  CARDIAC PANEL(CRET KIN+CKTOT+MB+TROPI) - Abnormal; Notable for the following:    Total CK 249 (*)    All other components within normal limits  CBC  PROTIME-INR  D-DIMER, QUANTITATIVE    Dg Chest 2 View  11/18/2011  *RADIOLOGY REPORT*  Clinical Data: Chest pain.  CHEST - 2 VIEW  Comparison: Chest x-ray 05/26/2010.  Findings: Lung volumes are normal.  No consolidative airspace disease.  No pleural effusions.  No pneumothorax.  No pulmonary nodule or mass noted.  Pulmonary vasculature and the cardiomediastinal silhouette are within normal limits.  IMPRESSION: 1. No radiographic evidence of acute cardiopulmonary disease.  Original Report Authenticated By: Florencia Reasons, M.D.   Ct Head Wo Contrast  11/18/2011  *  RADIOLOGY REPORT*  Clinical Data: Arm pain and tingling since this morning.  CT HEAD WITHOUT CONTRAST  Technique:  Contiguous axial images were obtained from the base of the skull through the vertex without contrast.  Comparison: Head CT 09/26/2010.  Findings: No acute intracranial abnormalities.  Specifically, no signs to suggest acute/subacute ischemia, no focal mass, mass effect, hydrocephalus or abnormal intra or extra-axial fluid collections.  No displaced skull fractures are identified. Visualized paranasal sinuses and mastoids are well pneumatized.  IMPRESSION: 1.  No acute intracranial abnormalities to account for the patient's symptoms.  The appearance of the brain is normal.  Original Report Authenticated By: Florencia Reasons, M.D.   Ct Angio Chest W/cm &/or Wo Cm  11/18/2011  *RADIOLOGY REPORT*  Clinical Data: Pain.  CT ANGIOGRAPHY CHEST  Technique:  Multidetector CT imaging of the chest using the standard protocol during bolus administration of intravenous contrast. Multiplanar reconstructed images including MIPs were obtained and reviewed to evaluate the vascular anatomy.  Contrast: OMNIPAQUE IOHEXOL 350 MG/ML IV SOLN  Comparison: PE protocol CT scan 11/01/2010.  Findings:  Mediastinum: There are no filling defects within the pulmonary arterial tree to suggest underlying pulmonary embolism. Heart size is normal. There is no significant pericardial fluid,  thickening or pericardial calcification. No pathologically enlarged mediastinal or hilar lymph nodes. No acute abnormality of the thoracic aorta; specifically, no aneurysm or dissection. There is a small hiatal hernia.  Lungs/Pleura: No consolidative airspace disease.  No pleural effusions.  No suspicious appearing pulmonary nodules or masses.  Upper Abdomen: Small calcified granuloma in the liver.  Otherwise, unremarkable.  Musculoskeletal: There are no aggressive appearing lytic or blastic lesions noted in the visualized portions of the skeleton.  IMPRESSION: 1.  No evidence of pulmonary embolism. 2.  No acute findings in the thorax to account for the patient's symptoms. 3.  Small hiatal hernia.  Original Report Authenticated By: Florencia Reasons, M.D.   Mr Brain Wo Contrast  11/18/2011  *RADIOLOGY REPORT*  Clinical Data: Hypertension.  Tingling of the left arm.  Numbness of the tongue.  MRI HEAD WITHOUT CONTRAST  Technique:  Multiplanar, multiecho pulse sequences of the brain and surrounding structures were obtained according to standard protocol without intravenous contrast.  Comparison: 09/27/2010  Findings: No change since the previous examination.  Diffusion imaging is normal.  The brainstem and cerebellum are normal.  The cerebral hemispheres show a few scattered punctate foci of T2 and FLAIR signal within the white matter.  The appearance is frequently seen in normal individuals.  One could not exclude minimal involvement with demyelinating disease, small vessel disease, vasculitis or migraine related foci, but that is less likely. These are not progressive.  No cortical or large vessel territory abnormality.  No mass lesion, hemorrhage, hydrocephalus or extra- axial collection.  No pituitary mass.  No inflammatory sinus disease.  No skull or skull base lesion.  IMPRESSION: No acute finding.  No change since the previous study.  Normal except for a few scattered punctate foci of T2 and FLAIR signal in  the hemispheric white matter not likely to be of clinical relevance.  See above discussion.  Original Report Authenticated By: Thomasenia Sales, M.D.     1. Chest pain   2. Anxiety       MDM  Concerning combination of chest pain with LUE weakness and tingling.  Onset this morning, last seen normal last night.  Does not meet code stroke criteria.  No other neuro deficits.  Negative  stress test 12/12.  ST on EKG, trop neg. Given hypertension, chest pain and neuro deficit, CT angio to evaluate for dissection.  No aortic dissection or PE. No stroke on MRI.  LUE weakness improving throughout ED stay. History of anxiety which may be contributing to symptoms. Second troponin pending at sign out to Dr. Golda Acre.  Suspect noncardiac chest pain. HR improved to 100s.    Date: 11/18/2011  Rate: 116  Rhythm: sinus tachycardia  QRS Axis: normal  Intervals: normal  ST/T Wave abnormalities: normal  Conduction Disutrbances:none  Narrative Interpretation:   Old EKG Reviewed: none available       Glynn Octave, MD 11/18/11 1755  Glynn Octave, MD 11/18/11 1610

## 2011-11-18 NOTE — Discharge Instructions (Signed)
Chest Pain (Nonspecific) It is often hard to give a specific diagnosis for the cause of chest pain. There is always a chance that your pain could be related to something serious, such as a heart attack or a blood clot in the lungs. You need to follow up with your caregiver for further evaluation. CAUSES   Heartburn.   Pneumonia or bronchitis.   Anxiety or stress.   Inflammation around your heart (pericarditis) or lung (pleuritis or pleurisy).   A blood clot in the lung.   A collapsed lung (pneumothorax). It can develop suddenly on its own (spontaneous pneumothorax) or from injury (trauma) to the chest.   Shingles infection (herpes zoster virus).  The chest wall is composed of bones, muscles, and cartilage. Any of these can be the source of the pain.  The bones can be bruised by injury.   The muscles or cartilage can be strained by coughing or overwork.   The cartilage can be affected by inflammation and become sore (costochondritis).  DIAGNOSIS  Lab tests or other studies, such as X-rays, electrocardiography, stress testing, or cardiac imaging, may be needed to find the cause of your pain.  TREATMENT   Treatment depends on what may be causing your chest pain. Treatment may include:   Acid blockers for heartburn.   Anti-inflammatory medicine.   Pain medicine for inflammatory conditions.   Antibiotics if an infection is present.   You may be advised to change lifestyle habits. This includes stopping smoking and avoiding alcohol, caffeine, and chocolate.   You may be advised to keep your head raised (elevated) when sleeping. This reduces the chance of acid going backward from your stomach into your esophagus.   Most of the time, nonspecific chest pain will improve within 2 to 3 days with rest and mild pain medicine.  HOME CARE INSTRUCTIONS   If antibiotics were prescribed, take your antibiotics as directed. Finish them even if you start to feel better.   For the next few  days, avoid physical activities that bring on chest pain. Continue physical activities as directed.   Do not smoke.   Avoid drinking alcohol.   Only take over-the-counter or prescription medicine for pain, discomfort, or fever as directed by your caregiver.   Follow your caregiver's suggestions for further testing if your chest pain does not go away.   Keep any follow-up appointments you made. If you do not go to an appointment, you could develop lasting (chronic) problems with pain. If there is any problem keeping an appointment, you must call to reschedule.  SEEK MEDICAL CARE IF:   You think you are having problems from the medicine you are taking. Read your medicine instructions carefully.   Your chest pain does not go away, even after treatment.   You develop a rash with blisters on your chest.  SEEK IMMEDIATE MEDICAL CARE IF:   You have increased chest pain or pain that spreads to your arm, neck, jaw, back, or abdomen.   You develop shortness of breath, an increasing cough, or you are coughing up blood.   You have severe back or abdominal pain, feel nauseous, or vomit.   You develop severe weakness, fainting, or chills.   You have a fever.  THIS IS AN EMERGENCY. Do not wait to see if the pain will go away. Get medical help at once. Call your local emergency services (911 in [place] Do not drive yourself to the hospital. MAKE SURE YOU:   Understand these instructions.     Will watch your condition.   Will get help right away if you are not doing well or get worse.  Document Released: 06/07/2005 Document Revised: 08/17/2011 Document Reviewed: 04/02/2008 [employer] Patient Information 2012 [employer].  Anxiety and Panic Attacks Your caregiver has informed you that you are having an anxiety or panic attack. There may be many forms of this. Most of the time these attacks come suddenly and without warning. They come at any time of day, including periods of sleep, and at any  time of life. They may be strong and unexplained. Although panic attacks are very scary, they are physically harmless. Sometimes the cause of your anxiety is not known. Anxiety is a protective mechanism of the body in its fight or flight mechanism. Most of these perceived danger situations are actually nonphysical situations (such as anxiety over losing a job). CAUSES  The causes of an anxiety or panic attack are many. Panic attacks may occur in otherwise healthy people given a certain set of circumstances. There may be a genetic cause for panic attacks. Some medications may also have anxiety as a side effect. SYMPTOMS  Some of the most common feelings are:  Intense terror.   Dizziness, feeling faint.   Hot and cold flashes.   Fear of going crazy.   Feelings that nothing is real.   Sweating.   Shaking.   Chest pain or a fast heartbeat (palpitations).   Smothering, choking sensations.   Feelings of impending doom and that death is near.   Tingling of extremities, this may be from over-breathing.   Altered reality (derealization).   Being detached from yourself (depersonalization).  Several symptoms can be present to make up anxiety or panic attacks. DIAGNOSIS  The evaluation by your caregiver will depend on the type of symptoms you are experiencing. The diagnosis of anxiety or panic attack is made when no physical illness can be determined to be a cause of the symptoms. TREATMENT  Treatment to prevent anxiety and panic attacks may include:  Avoidance of circumstances that cause anxiety.   Reassurance and relaxation.   Regular exercise.   Relaxation therapies, such as yoga.   Psychotherapy with a psychiatrist or therapist.   Avoidance of caffeine, alcohol and illegal drugs.   Prescribed medication.  SEEK IMMEDIATE MEDICAL CARE IF:   You experience panic attack symptoms that are different than your usual symptoms.   You have any worsening or concerning symptoms.    Document Released: 08/28/2005 Document Revised: 08/17/2011 Document Reviewed: 12/30/2009 [employer] Patient Information 2012 [employer].   

## 2011-11-20 ENCOUNTER — Encounter: Payer: Self-pay | Admitting: *Deleted

## 2011-11-20 ENCOUNTER — Telehealth: Payer: Self-pay | Admitting: Family

## 2011-11-20 ENCOUNTER — Telehealth: Payer: Self-pay | Admitting: *Deleted

## 2011-11-20 DIAGNOSIS — D729 Disorder of white blood cells, unspecified: Secondary | ICD-10-CM

## 2011-11-20 DIAGNOSIS — R5383 Other fatigue: Secondary | ICD-10-CM

## 2011-11-20 NOTE — Telephone Encounter (Signed)
Reviewed ED records.  Reports severe fatigue.   Still feels short of breath- hurts to take a deep breath.  She will call to get apt with Dr. Evelene Croon. Has upcoming apt with Dr. Excell Seltzer- cardiology and Dr. Vassie Loll pulmonary.  Toxic granulation noted on smear from ED. She will plan to follow up to lab in 1 week.  Will repeat cbc at that time and also check EBV panel and autoimmune work up.

## 2011-11-20 NOTE — Telephone Encounter (Signed)
Pt left message stating she was seen in the ER on Saturday for severe chest pain, swollen legs, pulse rate 146. States the ER ran tests and could find nothing wrong. States she is still having sob and chest pain today. Pt is requesting that you call her to discuss further options.

## 2011-11-20 NOTE — Telephone Encounter (Signed)
Opened in error

## 2011-11-27 ENCOUNTER — Telehealth: Payer: Self-pay | Admitting: *Deleted

## 2011-11-27 MED ORDER — VALACYCLOVIR HCL 500 MG PO TABS: ORAL_TABLET | ORAL | Status: DC

## 2011-11-27 NOTE — Telephone Encounter (Signed)
I recommend that she schedule and keep apt with Dr. Westley Chandler.  Unfortunately, a lot of the psychiatrists in the area are booked out.  I have sent rx for valtrex.

## 2011-11-27 NOTE — Telephone Encounter (Signed)
Received voice message from pt stating the first appt she could get with Dr Westley Chandler is the 2nd or 3rd week of April (6 weeks out). 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 Reports that she has taken this in the past and it helped.  Please advise.

## 2011-11-27 NOTE — Telephone Encounter (Signed)
Called pt and reached message that voice mailbox was full, unable to leave message.

## 2011-11-28 ENCOUNTER — Ambulatory Visit (INDEPENDENT_AMBULATORY_CARE_PROVIDER_SITE_OTHER): Payer: BC Managed Care – PPO | Admitting: Family

## 2011-11-28 ENCOUNTER — Encounter: Payer: Self-pay | Admitting: Family

## 2011-11-28 ENCOUNTER — Telehealth: Payer: Self-pay | Admitting: *Deleted

## 2011-11-28 VITALS — BP 150/104 | HR 101 | Temp 98.1°F | Resp 16 | Wt 183.0 lb

## 2011-11-28 DIAGNOSIS — F419 Anxiety disorder, unspecified: Secondary | ICD-10-CM

## 2011-11-28 DIAGNOSIS — I10 Essential (primary) hypertension: Secondary | ICD-10-CM

## 2011-11-28 DIAGNOSIS — F411 Generalized anxiety disorder: Secondary | ICD-10-CM

## 2011-11-28 LAB — SEDIMENTATION RATE: Sed Rate: 7 mm/hr (ref 0–22)

## 2011-11-28 LAB — CBC WITH DIFFERENTIAL/PLATELET
Eosinophils Relative: 1 % (ref 0–5)
HCT: 41.7 % (ref 36.0–46.0)
Hemoglobin: 14.3 g/dL (ref 12.0–15.0)
Lymphocytes Relative: 31 % (ref 12–46)
Lymphs Abs: 2.2 10*3/uL (ref 0.7–4.0)
MCV: 87.2 fL (ref 78.0–100.0)
Monocytes Relative: 6 % (ref 3–12)
Platelets: 246 10*3/uL (ref 150–400)
RBC: 4.78 MIL/uL (ref 3.87–5.11)
WBC: 7.1 10*3/uL (ref 4.0–10.5)

## 2011-11-28 LAB — ANA: Anti Nuclear Antibody(ANA): NEGATIVE

## 2011-11-28 LAB — EPSTEIN-BARR VIRUS VCA ANTIBODY PANEL: EBV VCA IgM: 0.08 {ISR}

## 2011-11-28 MED ORDER — LOSARTAN POTASSIUM 50 MG PO TABS: 75.0000 mg | ORAL_TABLET | Freq: Every day | ORAL | Status: DC

## 2011-11-28 NOTE — Assessment & Plan Note (Addendum)
Deteriorated.  She unfortunately is resistant to taking multiple BP meds as she believes that they cause chest pain.  Will try increasing cozaar to 75mg  once daily.  Continue furosemide.   I believe that her anxiety contributes to her complaint of chest pain.  25 minutes spent with pt today. >50% of this time was spent counseling pt on her anxiety and blood pressure.

## 2011-11-28 NOTE — Patient Instructions (Signed)
Please follow up in 1 month.  

## 2011-11-28 NOTE — Assessment & Plan Note (Signed)
She has apt with psychiatry arranged.  I encouraged her to keep this appointment. Continue xanax.

## 2011-11-28 NOTE — Telephone Encounter (Signed)
Pt reports feeling dizzy and elevated BP at home 153/100 yesterday. Still feels dizzy today, denies chest pain but feels it is a little difficult to breathe. Advised pt she could be evaluated in the office per Sandford Craze, FNP and scheduled f/u for today at 3:30pm.

## 2011-11-28 NOTE — Progress Notes (Signed)
Subjective:    Patient ID: Michele [name], female    DOB: 26-Nov-1970, 41 y.o.   MRN: [id number]  HPI  [name] is a 41 yr old female who presents today for for evaluation of her HTN.  She is currently maintained on losartan 50mg . Feels light headed and dizzy.  Has been feeling that way for the last few days.  BP last night was 153/100.  Woke up this AM and felt worse.   Pt was seen in ED on 3/9 for atypical CP.  Reviewed records.  Work up negative.    Review of Systems    see HPI  Past Medical History  Diagnosis Date  . Depression   . Hypertension   . Anemia     iron deficient, microcytic, hypochromic  . Anxiety   . Migraines   . 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 Fibroids     uterine  . Palpitations     recurrent  . Fatigue   . Tachycardia     unspecified  . Chest pain     atypical    History   Social History  . Marital Status: Married    Spouse Name: Michele [name]    Number of Children: 2  . Years of Education: N/A   Occupational History  . 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 Smoking status: Never Smoker   . Smokeless tobacco: Not on file  . Alcohol Use: Yes     Once a month  . Drug Use: No  . Sexually Active: Yes    Birth Control/ Protection: None   Other Topics Concern  . Not on file   Social History Narrative   Regular exercise: yesDaily caffeine: 1-2 daily    Past Surgical History  Procedure Date  . Appendectomy 03/2009  . Tubal ligation 1997  . Tuboplasty / tubotubal anastomosis 2005  . Biopsy thyroid November 28, 2009    Dr. Allena Katz- endo  . Dilation and curettage of uterus 05/21/2011    Procedure: DILATATION AND CURETTAGE (D&C);  Surgeon: Robley Fries;  Location: [facility];  Service: Gynecology;  Laterality: N/A;  dilitation and currettage/endometrial currettings    Family History  Problem Relation Age of Onset  . Hypertension Mother   . Cancer Mother 66    breast  . Cancer Father     colon  . Stroke  Brother     handicapped due to complications from spinal meningitis  . Seizures Brother   . Asthma Son   . Arthritis Maternal Grandmother   . Hypertension Maternal Grandmother   . Stroke Maternal Grandfather     Allergies  Allergen Reactions  . Amlodipine     Palpitations/shortness of breath  . Wellbutrin (Bupropion Hcl) Other (See Comments)    Dizziness, increased anxiety    Current Outpatient Prescriptions on File Prior to Visit  Medication Sig Dispense Refill  . ALPRAZolam (XANAX) 1 MG tablet Take 1 mg by mouth 3 (three) times daily as needed.      . furosemide (LASIX) 20 MG tablet Take 1 tablet (20 mg total) by mouth daily.  30 tablet  3  . SUMAtriptan (IMITREX) 50 MG tablet Take 1 tab at start of headache, may repeat once 2 hours later if your symptoms return or do not improve.  15 tablet  0  . valACYclovir (VALTREX) 500 MG tablet 4 tabs by mouth now and repeat 4 tabs again 12 hours later  for cold sore.  8 tablet  0    BP 150/104  Pulse 101  Temp(Src) 98.1 F (36.7 C) (Oral)  Resp 16  Wt 183 lb (83.008 kg)  SpO2 98%    Objective:   Physical Exam  Constitutional: She is oriented to person, place, and time. She appears well-developed and well-nourished. No distress.  Neck: Normal range of motion. Neck supple.  Cardiovascular: Normal rate and regular rhythm.   No murmur heard. Pulmonary/Chest: Effort normal and breath sounds normal. No respiratory distress. She has no wheezes. She has no rales. She exhibits no tenderness.  Musculoskeletal: She exhibits no edema.  Neurological: She is alert and oriented to person, place, and time.  Skin: Skin is warm and dry.  Psychiatric: Her speech is normal and behavior is normal. Judgment and thought content normal. Her mood appears anxious.          Assessment & Plan:  ED records note mild hyperglycemia in ED- Plan to check A1C next visit.

## 2011-11-28 NOTE — Telephone Encounter (Signed)
Notified pt. 

## 2011-12-01 ENCOUNTER — Encounter: Payer: Self-pay | Admitting: Family

## 2011-12-05 ENCOUNTER — Ambulatory Visit (INDEPENDENT_AMBULATORY_CARE_PROVIDER_SITE_OTHER): Payer: BC Managed Care – PPO | Admitting: Pulmonary Disease

## 2011-12-05 ENCOUNTER — Other Ambulatory Visit: Payer: Self-pay | Admitting: Family

## 2011-12-05 ENCOUNTER — Encounter: Payer: Self-pay | Admitting: Pulmonary Disease

## 2011-12-05 VITALS — BP 140/98 | HR 88 | Temp 98.0°F | Ht 64.0 in | Wt 180.0 lb

## 2011-12-05 DIAGNOSIS — G4733 Obstructive sleep apnea (adult) (pediatric): Secondary | ICD-10-CM

## 2011-12-05 DIAGNOSIS — R059 Cough, unspecified: Secondary | ICD-10-CM

## 2011-12-05 DIAGNOSIS — R05 Cough: Secondary | ICD-10-CM

## 2011-12-05 DIAGNOSIS — Z1231 Encounter for screening mammogram for malignant neoplasm of breast: Secondary | ICD-10-CM

## 2011-12-05 MED ORDER — OMEPRAZOLE 40 MG PO CPDR: DELAYED_RELEASE_CAPSULE | ORAL | Status: DC

## 2011-12-05 NOTE — Progress Notes (Signed)
  Subjective:    Patient ID: Michele [name], female    DOB: [birth year], 41 y.o.   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 However she also reports a cough and dyspnea. [facility] and revealed that she has osa.  States currently using a cpap 5 days a week for about 8 hours a night. She reported waking up with a feeling of gas pain or choking episodes during sleep. Epworth sleepiness score was 11/24. Bedtime is 10:30 to 11 PM, sleep latency was less than to 10 minutes, 1-2 awakenings without any post void sleep latency, out of bed at 6:30 AM feeling tired and occasional headache with dryness of mouth. She gained 30 pounds in the last 2 years but has lost 18 in the last 3 months. CPAP is set at 7 cm, [facility]. Tube was dyspnea for about 2 months and excessive daytime fatigue. This is noted at exertion as well as at rest and periodically she feels the need to take a deep breath. CT angiogram was negative for pulmonary embolism. Thyroid nodules were noted to be benign an ultrasound. She reports an a.m. cough mostly dry for the last 2 weeks. She reports symptoms of heartburn and chest discomfort about 2-3 times per week. This appears to be meal related. Anxiety is being considered as a cause of some of her symptoms. Spirometry did not show any evidence of airway obstruction today  Review of Systems  Constitutional: Positive for unexpected weight change. Negative for fever.  HENT: Positive for sneezing and trouble swallowing. Negative for ear pain, nosebleeds, congestion, sore throat, rhinorrhea, dental problem, postnasal drip and sinus pressure.   Eyes: Negative for redness and itching.  Respiratory: Positive for cough, chest tightness and shortness of breath. Negative for wheezing.   Cardiovascular: Positive for palpitations and leg swelling.  Gastrointestinal: Negative for nausea and  vomiting.  Genitourinary: Negative for dysuria.  Musculoskeletal: Negative for joint swelling.  Skin: Negative for rash.  Neurological: Positive for headaches.  Hematological: Does not bruise/bleed easily.  Psychiatric/Behavioral: Negative for dysphoric mood. The patient is nervous/anxious.        Objective:   Physical Exam  Gen. Pleasant, well-nourished, in no distress, normal affect ENT - no lesions, no post nasal drip, nml size tonsils, class 2 airway  Neck: No JVD, no thyromegaly, no carotid bruits Lungs: no use of accessory muscles, no dullness to percussion, clear without rales or rhonchi  Cardiovascular: Rhythm regular, heart sounds  normal, no murmurs or gallops, no peripheral edema Abdomen: soft and non-tender, no hepatosplenomegaly, BS normal. Musculoskeletal: No deformities, no cyanosis or clubbing Neuro:  alert, non focal        Assessment & Plan:

## 2011-12-05 NOTE — Assessment & Plan Note (Signed)
Doubt upper airway or asthma Spirometry no airway obstruction, Ct angio neg for PE Her dyspnea may be related to deconditioning or anxiety - I am surprised that inspite of an exercise program x 3 months, she has not caught up. Trial of omeprazole x 6 wks

## 2011-12-05 NOTE — Assessment & Plan Note (Signed)
[facility] Obtain CPAP download from [facility] (D[facility]) - I doubt that modafinil would be necessary here. Weight loss encouraged, compliance with goal of at least 4-6 hrs every night is the expectation. Advised against medications with sedative side effects Cautioned against driving when sleepy - understanding that sleepiness will vary on a day to day basis

## 2011-12-05 NOTE — Patient Instructions (Signed)
[facility] Obtain [facility] from [facility] ([facility]) Breathing test Trial of omeprazole daily x 6 weeks We discussed other measures for reflux - small frequent meals, no eating 2-3 h before bedtime

## 2011-12-11 ENCOUNTER — Ambulatory Visit (INDEPENDENT_AMBULATORY_CARE_PROVIDER_SITE_OTHER): Payer: BC Managed Care – PPO | Admitting: Cardiovascular Disease

## 2011-12-11 ENCOUNTER — Encounter: Payer: Self-pay | Admitting: Cardiovascular Disease

## 2011-12-11 VITALS — BP 148/90 | HR 117 | Ht 65.0 in | Wt 180.1 lb

## 2011-12-11 DIAGNOSIS — I10 Essential (primary) hypertension: Secondary | ICD-10-CM

## 2011-12-11 DIAGNOSIS — R002 Palpitations: Secondary | ICD-10-CM

## 2011-12-11 DIAGNOSIS — I119 Hypertensive heart disease without heart failure: Secondary | ICD-10-CM

## 2011-12-11 MED ORDER — LABETALOL HCL 100 MG PO TABS: 100.0000 mg | ORAL_TABLET | Freq: Two times a day (BID) | ORAL | Status: DC

## 2011-12-11 NOTE — Patient Instructions (Signed)
Non-Cardiac CT Angiography (CTA), is a special type of CT scan that uses a computer to produce multi-dimensional views of major blood vessels throughout the body. In CT angiography, a contrast material is injected through an IV to help visualize the blood vessels--CTA for subclavian stenosis  Your physician has requested that you have a renal artery duplex. During this test, an ultrasound is used to evaluate blood flow to the kidneys. Allow one hour for this exam. Do not eat after midnight the day before and avoid carbonated beverages. Take your medications as you usually do.  Your physician wants you to follow-up in: 3 MONTHS. You will receive a reminder letter in the mail two months in advance. If you don't receive a letter, please call our office to schedule the follow-up appointment.  Your physician has recommended you make the following change in your medication: START Labetalol 100mg  take one by mouth twice a day

## 2011-12-11 NOTE — Progress Notes (Signed)
HPI:  This is a 41 year old woman presented for followup evaluation. The patient has chronic chest pain with negative stress testing. She most recently had a stress echocardiogram in December 2012 which showed no abnormalities. Over the last 4 weeks, the patient has developed left sided chest pain around the left lateral rib cage and axillary region. Her chest pain is constant. She notes that worse after eating. It feels like a fullness. There is no exertional component present. She continues to have problems with palpitations and high blood pressure. She is concerned that she is on too much blood pressure medication and she has side effects to the antihypertensive drugs. She was evaluated recently in the emergency room for chest pain.  The patient underwent a CT scan of the chest that showed no filling defects within the pulmonary arterial tree. The thoracic aorta appeared normal without aneurysm or dissection. There was an incidental notation of a small hiatal hernia.  Outpatient Encounter Prescriptions as of 12/11/2011  Medication Sig Dispense Refill  . ALPRAZolam (XANAX) 1 MG tablet Take 1 mg by mouth 3 (three) times daily as needed.      Marland Kitchen BIOTIN PO Take by mouth daily.      . furosemide (LASIX) 20 MG tablet Take 1 tablet (20 mg total) by mouth daily.  30 tablet  3  . losartan (COZAAR) 50 MG tablet Take 1.5 tablets (75 mg total) by mouth daily.  45 tablet  0  . Multiple Vitamin (MULTI-VITAMIN PO) Take by mouth daily.      Marland Kitchen omeprazole (PRILOSEC) 40 MG capsule Take 1 tablet daily for 6 weeks  30 capsule  2  . SUMAtriptan (IMITREX) 50 MG tablet as needed. Take 1 tab at start of headache, may repeat once 2 hours later if your symptoms return or do not improve.      . valACYclovir (VALTREX) 500 MG tablet as needed. 4 tabs by mouth now and repeat 4 tabs again 12 hours later for cold sore.      Marland Kitchen DISCONTD: SUMAtriptan (IMITREX) 50 MG tablet Take 1 tab at start of headache, may repeat once 2 hours later  if your symptoms return or do not improve.  15 tablet  0  . DISCONTD: valACYclovir (VALTREX) 500 MG tablet 4 tabs by mouth now and repeat 4 tabs again 12 hours later for cold sore.  8 tablet  0    Allergies  Allergen Reactions  . Amlodipine     Palpitations/shortness of breath  . Wellbutrin (Bupropion Hcl) Other (See Comments)    Dizziness, increased anxiety    Past Medical History  Diagnosis Date  . Depression   . Hypertension   . Anemia     iron deficient, microcytic, hypochromic  . Anxiety   . Migraines   . 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 Fibroids     uterine  . Palpitations     recurrent  . Fatigue   . Tachycardia     unspecified  . Chest pain     atypical    ROS: Negative except as per HPI  BP 148/90  Pulse 117  Ht 5\' 5"  (1.651 m)  Wt 81.702 kg (180 lb 1.9 oz)  BMI 29.97 kg/m2  PHYSICAL EXAM: Pt is alert and oriented, NAD Blood pressure on my check was 155/110 on the left and 150/90 on the right. The patient's heart rate was 92 beats per minute. HEENT: normal Neck: JVP - normal, carotids 2+= without bruits  Lungs: CTA bilaterally CV: RRR without murmur or gallop Abd: soft, NT, Positive BS, no hepatomegaly Ext: no C/C/E, distal pulses intact and equal Skin: warm/dry no rash  ASSESSMENT AND PLAN:

## 2011-12-11 NOTE — Assessment & Plan Note (Signed)
The patient's hypertension remains moderately difficult to control. She has a 20 mm mercury difference in diastolic blood pressures between the left and right arms. I don't know how to explain this, but I have suggested a CT angiogram to rule out large vessel vasculitis or subclavian stenosis. I have recommended that the patient had labetalol 100 mg twice daily to her medicine regimen. I've also recommended that she change her furosemide as needed dosing. Had a long discussion with her that she actually is not on much antihypertensive therapy. She now understands that furosemide is a very weak antihypertensive agent and that she really only takes losartan for her blood pressure. I informed her that many patients require multiple antihypertensive drugs, especially with a strong family history of hypertension and multiple family members as this patient has. I have reviewed her past testing and she has undergone a fairly extensive evaluation. This has included 24-hour urine for metanephrines. I don't see any past testing for renal artery stenosis. I recommended that she undergo a renal arterial duplex to rule out fibromuscular dysplasia as a cause of secondary hypertension. I would like to see the patient back in 3 months for followup.

## 2011-12-11 NOTE — Assessment & Plan Note (Signed)
Past testing has been unremarkable. I think there is a strong component of anxiety. The patient's chest pain is also highly atypical and does not require further evaluation.

## 2011-12-13 ENCOUNTER — Ambulatory Visit (INDEPENDENT_AMBULATORY_CARE_PROVIDER_SITE_OTHER)
Admission: RE | Admit: 2011-12-13 | Discharge: 2011-12-13 | Disposition: A | Discharge status: Home / Self Care | Payer: BC Managed Care – PPO | Source: Ambulatory Visit | Attending: Cardiology | Admitting: Cardiology

## 2011-12-13 ENCOUNTER — Telehealth: Payer: Self-pay | Admitting: *Deleted

## 2011-12-13 DIAGNOSIS — I119 Hypertensive heart disease without heart failure: Secondary | ICD-10-CM

## 2011-12-13 MED ORDER — IOHEXOL 350 MG/ML SOLN
100.0000 mL | Freq: Once | INTRAVENOUS | Status: AC | PRN
  Administered 2011-12-13: 100 mL via INTRAVENOUS

## 2011-12-13 NOTE — Telephone Encounter (Signed)
Per verbal from [facility], ok to provide letter through 12/28/11 and then will need further recommendations from psychiatry. Letter printed for provider signature.

## 2011-12-13 NOTE — Telephone Encounter (Signed)
Letter signed, pt notified. Letter placed at front desk for pick up.

## 2011-12-13 NOTE — Telephone Encounter (Signed)
Received call from Michele [name] stating she will be seeing psychiatry on 12/26/11. Saw Dr Excell Seltzer and was placed on another BP med yesterday. Michele [name] is scheduled for some additional studies with Dr Excell Seltzer. States that she is under more stress at work. Reports having to take Xanax three times a day for anxiety. This makes her sleepy and she is falling behind in her work, having a hard time keeping up.  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 Michele [name] also anxious about uncontrolled BP.  Michele [name] is wanting to know if you will write her out of work until she sees psychiatry on 4/16 to help her get her anxiety controlled.  Please advise.

## 2011-12-18 ENCOUNTER — Ambulatory Visit: Payer: BC Managed Care – PPO | Admitting: Family

## 2011-12-18 ENCOUNTER — Encounter: Payer: Self-pay | Admitting: Cardiovascular Disease

## 2011-12-18 NOTE — Telephone Encounter (Signed)
Fu call °Patient returning your call °

## 2011-12-18 NOTE — Telephone Encounter (Signed)
This encounter was created in error - please disregard.

## 2011-12-19 ENCOUNTER — Telehealth: Payer: Self-pay | Admitting: Pulmonary Disease

## 2011-12-19 NOTE — Telephone Encounter (Signed)
[facility] 6/12 > mild OSA with AHI 8/h, REM AHI 30/h corrected by CPAP 7 cm Spirometry nml

## 2011-12-20 ENCOUNTER — Telehealth: Payer: Self-pay | Admitting: *Deleted

## 2011-12-20 MED ORDER — ALPRAZOLAM 1 MG PO TABS: 1.0000 mg | ORAL_TABLET | Freq: Three times a day (TID) | ORAL | Status: DC | PRN

## 2011-12-20 NOTE — Telephone Encounter (Signed)
Verified with [facility] on [address] that there are no refills remaining on alprazolam; last filled on 11/21/11. Called refill to [place] [facility] [place] and verified that 1 refill remains on Furosemide, they will fill for pt. Notified pt.
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
## 2011-12-22 ENCOUNTER — Encounter: Payer: Self-pay | Admitting: Family

## 2011-12-25 ENCOUNTER — Encounter (INDEPENDENT_AMBULATORY_CARE_PROVIDER_SITE_OTHER): Payer: BC Managed Care – PPO

## 2011-12-25 DIAGNOSIS — I119 Hypertensive heart disease without heart failure: Secondary | ICD-10-CM

## 2011-12-25 DIAGNOSIS — I10 Essential (primary) hypertension: Secondary | ICD-10-CM

## 2011-12-29 ENCOUNTER — Ambulatory Visit: Payer: BC Managed Care – PPO | Admitting: Family

## 2012-01-01 ENCOUNTER — Telehealth: Payer: Self-pay | Admitting: Pulmonary Disease

## 2012-01-01 NOTE — Telephone Encounter (Signed)
Download reviewd 03/22/11 to 12/20/11 - 7 cm is adequate pressure. Her compliance has dropped lately - expectation is use at least 4h/ night EVERY night

## 2012-01-02 ENCOUNTER — Telehealth: Payer: Self-pay | Admitting: Cardiovascular Disease

## 2012-01-02 NOTE — Telephone Encounter (Signed)
Patient returning nurse call, she can be reached at [phone].

## 2012-01-02 NOTE — Telephone Encounter (Signed)
I spoke with patient about results and she verbalized understanding and had no questions 

## 2012-01-02 NOTE — Telephone Encounter (Signed)
I spoke with the pt and made her aware of renal duplex results.  The pt's BP is still running around 150/90.  The pt is taking her medications as prescribed.  The pt is scheduled to see her PCP tomorrow and they can also make adjustments in her BP medications.  The pt continues to complain of numbness and swelling in tongue.  I made the pt aware of Head CT and MRI of Brain results that are in EPIC.  The pt will discuss her symptoms with the PCP tomorrow. I will review the pt's chart after she is seen by PCP to see if Cardiology needs to make any other adjustments in medication.

## 2012-01-03 ENCOUNTER — Ambulatory Visit (INDEPENDENT_AMBULATORY_CARE_PROVIDER_SITE_OTHER): Payer: BC Managed Care – PPO | Admitting: Family

## 2012-01-03 ENCOUNTER — Encounter: Payer: Self-pay | Admitting: Family

## 2012-01-03 ENCOUNTER — Ambulatory Visit (HOSPITAL_BASED_OUTPATIENT_CLINIC_OR_DEPARTMENT_OTHER)
Admission: RE | Admit: 2012-01-03 | Discharge: 2012-01-03 | Disposition: A | Discharge status: Home / Self Care | Payer: BC Managed Care – PPO | Source: Ambulatory Visit | Attending: Family | Admitting: Family

## 2012-01-03 VITALS — BP 140/84 | HR 73 | Temp 98.0°F | Resp 16 | Wt 179.1 lb

## 2012-01-03 DIAGNOSIS — F411 Generalized anxiety disorder: Secondary | ICD-10-CM

## 2012-01-03 DIAGNOSIS — Z1231 Encounter for screening mammogram for malignant neoplasm of breast: Secondary | ICD-10-CM | POA: Insufficient documentation

## 2012-01-03 DIAGNOSIS — R209 Unspecified disturbances of skin sensation: Secondary | ICD-10-CM

## 2012-01-03 DIAGNOSIS — R1310 Dysphagia, unspecified: Secondary | ICD-10-CM

## 2012-01-03 DIAGNOSIS — F419 Anxiety disorder, unspecified: Secondary | ICD-10-CM

## 2012-01-03 DIAGNOSIS — R200 Anesthesia of skin: Secondary | ICD-10-CM

## 2012-01-03 DIAGNOSIS — I10 Essential (primary) hypertension: Secondary | ICD-10-CM

## 2012-01-03 DIAGNOSIS — E042 Nontoxic multinodular goiter: Secondary | ICD-10-CM

## 2012-01-03 NOTE — Patient Instructions (Signed)
Please follow up in 3 months.  You will be contacted about your referral to neurology.

## 2012-01-03 NOTE — Progress Notes (Signed)
Subjective:    Patient ID: Michele [name], female    DOB: [birth year], 41 y.o.   MRN: [id number]  HPI   [name] is a 41 yr old female who presents today for follow up.  Anxiety- saw Dr Evelene Croon.  She was placed on prozac and was written out of work until 5/23.  She reports feeling very drowsy.   Dysphagia- has apt tomorrow at [facility].    HTN-  She underwent some addition work up with Dr. Excell Seltzer which included a negative renal duplex and a CTA of the neck which noted no evidence for carotid, subclavian, or vertebral stenosis or  irregularity.She was placed on a beta blocker.    R sided facial numbness-Facial numbness right side of face, tingling right side of tongue.   Review of Systems See HPI  Past Medical History  Diagnosis Date  . Depression   . Hypertension   . Anemia     iron deficient, microcytic, hypochromic  . Anxiety   . Migraines   . 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 Fibroids     uterine  . Palpitations     recurrent  . Fatigue   . Tachycardia     unspecified  . Chest pain     atypical  . 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 Allena Katz at [facility]- S/p FNA March/april of two dominant nodules- benign.  Following for annual thyroid US with Dr. Allena Katz.      History   Social History  . Marital Status: Married    Spouse Name: [name]    Number of Children: 2  . Years of Education: N/A   Occupational History  . 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 Smoking status: Never Smoker   . Smokeless tobacco: Never Used  . Alcohol Use: Yes     Once a month  . Drug Use: No  . Sexually Active: Yes    Birth Control/ Protection: None   Other Topics Concern  . Not on file   Social History Narrative   Regular exercise: yesDaily caffeine: 1-2 daily    Past Surgical History  Procedure Date  . Appendectomy 03/2009  . Tubal ligation 1997  . Tuboplasty / tubotubal anastomosis 2005  . Biopsy  thyroid November 28, 2009    Dr. Allena Katz- endo  . Dilation and curettage of uterus 05/21/2011    Procedure: DILATATION AND CURETTAGE (D&C);  Surgeon: Robley Fries;  Location: [facility];  Service: Gynecology;  Laterality: N/A;  dilitation and currettage/endometrial currettings    Family History  Problem Relation Age of Onset  . Hypertension Mother   . Cancer Mother 31    breast  . Cancer Father     colon  . Stroke Brother     handicapped due to complications from spinal meningitis  . Seizures Brother   . Asthma Son   . Arthritis Maternal Grandmother   . Hypertension Maternal Grandmother   . Stroke Maternal Grandfather     Allergies  Allergen Reactions  . Amlodipine     Palpitations/shortness of breath  . Wellbutrin (Bupropion Hcl) Other (See Comments)    Dizziness, increased anxiety    Current Outpatient Prescriptions on File Prior to Visit  Medication Sig Dispense Refill  . ALPRAZolam (XANAX) 1 MG tablet Take 1 tablet (1 mg total) by mouth 3 (three) times daily as needed.  90 tablet  0  . BIOTIN PO  Take by mouth daily.      Marland Kitchen FLUoxetine (PROZAC) 20 MG capsule Take 20 mg by mouth daily.      . furosemide (LASIX) 20 MG tablet Take 1 tablet (20 mg total) by mouth daily.  30 tablet  3  . labetalol (NORMODYNE) 100 MG tablet Take 1 tablet (100 mg total) by mouth 2 (two) times daily.  60 tablet  11  . losartan (COZAAR) 50 MG tablet Take 1.5 tablets (75 mg total) by mouth daily.  45 tablet  0  . Multiple Vitamin (MULTI-VITAMIN PO) Take by mouth daily.      Marland Kitchen omeprazole (PRILOSEC) 40 MG capsule Take 1 tablet daily for 6 weeks  30 capsule  2  . SUMAtriptan (IMITREX) 50 MG tablet as needed. Take 1 tab at start of headache, may repeat once 2 hours later if your symptoms return or do not improve.      . valACYclovir (VALTREX) 500 MG tablet as needed. 4 tabs by mouth now and repeat 4 tabs again 12 hours later for cold sore.      Marland Kitchen DISCONTD: buPROPion (WELLBUTRIN XL) 150 MG 24 hr tablet Take 2  tablets (300 mg total) by mouth daily.  60 tablet  1    BP 140/84  Pulse 73  Temp(Src) 98 F (36.7 C) (Oral)  Resp 16  Wt 179 lb 1.9 oz (81.248 kg)  SpO2 98%       Objective:   Physical Exam  Constitutional: She appears well-developed and well-nourished. No distress.  Cardiovascular: Normal rate and regular rhythm.   No murmur heard. Neurological:       Mild numbness to palpation of the right cheek.  + facial symmetry.   Psychiatric: Her speech is normal and behavior is normal. Judgment and thought content normal. Her mood appears not anxious. Cognition and memory are normal. She does not exhibit a depressed mood.          Assessment & Plan:

## 2012-01-04 DIAGNOSIS — R200 Anesthesia of skin: Secondary | ICD-10-CM | POA: Insufficient documentation

## 2012-01-04 DIAGNOSIS — R1310 Dysphagia, unspecified: Secondary | ICD-10-CM | POA: Insufficient documentation

## 2012-01-04 NOTE — Assessment & Plan Note (Signed)
Now following with psychiatry.  She seems calmer to me today.  Hopefully she can stick with the prozac as I think it will really help her.

## 2012-01-04 NOTE — Assessment & Plan Note (Signed)
She is scheduled to see GI.

## 2012-01-04 NOTE — Assessment & Plan Note (Signed)
BP Readings from Last 3 Encounters:  01/03/12 140/84  12/11/11 148/90  12/05/11 140/98   BP is improved with addition of beta blocker.  I actually think her drowsiness is due more to the benzos than the beta blocker.

## 2012-01-04 NOTE — Assessment & Plan Note (Signed)
Follows with Dr. Allena Katz at [facility]- S/p FNA March/april of two dominant nodules- benign.  Following for annual thyroid US with Dr. Allena Katz.

## 2012-01-04 NOTE — Assessment & Plan Note (Signed)
Not new.  Had normal MRI back in March- except for a few scattered punctate foci of T2 and FLAIR signal in  the hemispheric white matter not likely to be of clinical relevance.  Seems like an issue with right facial nerve.  Will refer to Neuro for evaluation.  She has seen Dr. Richardean Chimera in the past.

## 2012-01-05 NOTE — Telephone Encounter (Signed)
BP medications were not adjusted by PCP. The PCP did refer pt to Neurology due to numbness in face. I will forward this information to Dr Excell Seltzer to recommend any further adjustments in medications.

## 2012-01-05 NOTE — Telephone Encounter (Signed)
BP has been mildly elevated at office visits. I would recommend increase losartan to 100 mg daily. Keep BP log and bring in to next office visit with Sandford Craze. If BP remains above goal could consider change to a more potent ARB - this should be covered by her insurance since she cannot get to BP goal on losartan. BP can be managed by her PCP who sees her regularly.

## 2012-01-09 ENCOUNTER — Telehealth: Payer: Self-pay | Admitting: *Deleted

## 2012-01-09 NOTE — Telephone Encounter (Signed)
Left message to call back  

## 2012-01-09 NOTE — Telephone Encounter (Signed)
Patient returned phone call. I informed her that FMLA paperwork has been completed and is ready to be picked up.

## 2012-01-09 NOTE — Telephone Encounter (Signed)
FMLA paperwork has been completed.  Left message for pt to return my call.

## 2012-01-15 ENCOUNTER — Telehealth: Payer: Self-pay | Admitting: *Deleted

## 2012-01-15 DIAGNOSIS — I10 Essential (primary) hypertension: Secondary | ICD-10-CM

## 2012-01-15 NOTE — Telephone Encounter (Signed)
[facility] # is [phone].

## 2012-01-15 NOTE — Telephone Encounter (Signed)
Received message from pt requesting referral to the Hypertension clinic at [facility]. States BP this weekend was consistently 170s/100s.  Please advise.

## 2012-01-15 NOTE — Telephone Encounter (Signed)
Please call pt and let her know that I have placed referral. Michele [name] will call her when appointment is made.

## 2012-01-15 NOTE — Telephone Encounter (Signed)
Attempted to reach pt, mailbox is full and unable to leave message.

## 2012-01-16 ENCOUNTER — Encounter: Payer: Self-pay | Admitting: Cardiovascular Disease

## 2012-01-16 MED ORDER — LOSARTAN POTASSIUM 100 MG PO TABS: 100.0000 mg | ORAL_TABLET | Freq: Every day | ORAL | Status: DC

## 2012-01-16 NOTE — Telephone Encounter (Signed)
New Problem: ° ° ° °Patient returned your call.  Please call back. °

## 2012-01-16 NOTE — Telephone Encounter (Signed)
This encounter was created in error - please disregard.

## 2012-01-16 NOTE — Telephone Encounter (Signed)
I spoke with the pt and made her aware of Dr Earmon Phoenix recommendation.  New Rx sent to pharmacy for Losartan 100mg  daily. Pt will keep a BP log and follow up with PCP for BP issues.

## 2012-01-16 NOTE — Telephone Encounter (Signed)
Notified pt and advised her to let us know if she hasn't been contacted re: referral within 1 week.

## 2012-01-23 ENCOUNTER — Ambulatory Visit: Payer: BC Managed Care – PPO | Admitting: Pulmonary Disease

## 2012-02-01 ENCOUNTER — Emergency Department (HOSPITAL_BASED_OUTPATIENT_CLINIC_OR_DEPARTMENT_OTHER)
Admission: EM | Admit: 2012-02-01 | Discharge: 2012-02-01 | Disposition: A | Discharge status: Home / Self Care | Payer: BC Managed Care – PPO | Attending: Emergency Medicine | Admitting: Emergency Medicine

## 2012-02-01 ENCOUNTER — Encounter (HOSPITAL_BASED_OUTPATIENT_CLINIC_OR_DEPARTMENT_OTHER): Payer: Self-pay | Admitting: *Deleted

## 2012-02-01 DIAGNOSIS — R51 Headache: Secondary | ICD-10-CM

## 2012-02-01 DIAGNOSIS — F341 Dysthymic disorder: Secondary | ICD-10-CM | POA: Insufficient documentation

## 2012-02-01 DIAGNOSIS — R079 Chest pain, unspecified: Secondary | ICD-10-CM | POA: Insufficient documentation

## 2012-02-01 DIAGNOSIS — I10 Essential (primary) hypertension: Secondary | ICD-10-CM

## 2012-02-01 DIAGNOSIS — Z79899 Other long term (current) drug therapy: Secondary | ICD-10-CM | POA: Insufficient documentation

## 2012-02-01 LAB — DIFFERENTIAL
Basophils Absolute: 0 10*3/uL (ref 0.0–0.1)
Eosinophils Relative: 1 % (ref 0–5)
Lymphocytes Relative: 36 % (ref 12–46)
Lymphs Abs: 2.3 10*3/uL (ref 0.7–4.0)
Monocytes Absolute: 0.6 10*3/uL (ref 0.1–1.0)
Monocytes Relative: 9 % (ref 3–12)
Neutro Abs: 3.4 10*3/uL (ref 1.7–7.7)

## 2012-02-01 LAB — BASIC METABOLIC PANEL
CO2: 27 mEq/L (ref 19–32)
Calcium: 9.7 mg/dL (ref 8.4–10.5)
Chloride: 103 mEq/L (ref 96–112)
Creatinine, Ser: 0.8 mg/dL (ref 0.50–1.10)
Glucose, Bld: 90 mg/dL (ref 70–99)

## 2012-02-01 LAB — CBC
HCT: 35.7 % — ABNORMAL LOW (ref 36.0–46.0)
Hemoglobin: 12.8 g/dL (ref 12.0–15.0)
MCV: 82.4 fL (ref 78.0–100.0)
RBC: 4.33 MIL/uL (ref 3.87–5.11)
WBC: 6.3 10*3/uL (ref 4.0–10.5)

## 2012-02-01 LAB — TROPONIN I: Troponin I: <0.3 ng/mL (ref <0.30)

## 2012-02-01 MED ORDER — DIPHENHYDRAMINE HCL 50 MG/ML IJ SOLN
25.0000 mg | Freq: Once | INTRAMUSCULAR | Status: AC
  Administered 2012-02-01: 25 mg via INTRAVENOUS
  Filled 2012-02-01: qty 1

## 2012-02-01 MED ORDER — KETOROLAC TROMETHAMINE 30 MG/ML IJ SOLN
30.0000 mg | Freq: Once | INTRAMUSCULAR | Status: AC
  Administered 2012-02-01: 30 mg via INTRAVENOUS
  Filled 2012-02-01: qty 1

## 2012-02-01 MED ORDER — METOCLOPRAMIDE HCL 5 MG/ML IJ SOLN
10.0000 mg | Freq: Once | INTRAMUSCULAR | Status: AC
  Administered 2012-02-01: 10 mg via INTRAVENOUS
  Filled 2012-02-01: qty 2

## 2012-02-01 NOTE — ED Notes (Signed)
Hypertension has been uncontrolled for 6 weeks. Her MD as made her  an appointment in June for evaluation. C/o headache.

## 2012-02-01 NOTE — ED Notes (Signed)
States she is having chest tightness. Has a cardiologist that she sees for this same tightness.

## 2012-02-01 NOTE — ED Notes (Signed)
Pt reports having an endoscopy on tues 177/102 bp at that time, she was given iv labetolol at that time and fentanyl to decrease bp enough to get endoscopy done.

## 2012-02-01 NOTE — Discharge Instructions (Signed)
Chest Pain (Nonspecific) It is often hard to give a specific diagnosis for the cause of chest pain. There is always a chance that your pain could be related to something serious, such as a heart attack or a blood clot in the lungs. You need to follow up with your caregiver for further evaluation. CAUSES   Heartburn.   Pneumonia or bronchitis.   Anxiety or stress.   Inflammation around your heart (pericarditis) or lung (pleuritis or pleurisy).   A blood clot in the lung.   A collapsed lung (pneumothorax). It can develop suddenly on its own (spontaneous pneumothorax) or from injury (trauma) to the chest.   Shingles infection (herpes zoster virus).  The chest wall is composed of bones, muscles, and cartilage. Any of these can be the source of the pain.  The bones can be bruised by injury.   The muscles or cartilage can be strained by coughing or overwork.   The cartilage can be affected by inflammation and become sore (costochondritis).  DIAGNOSIS  Lab tests or other studies, such as X-rays, electrocardiography, stress testing, or cardiac imaging, may be needed to find the cause of your pain.  TREATMENT   Treatment depends on what may be causing your chest pain. Treatment may include:   Acid blockers for heartburn.   Anti-inflammatory medicine.   Pain medicine for inflammatory conditions.   Antibiotics if an infection is present.   You may be advised to change lifestyle habits. This includes stopping smoking and avoiding alcohol, caffeine, and chocolate.   You may be advised to keep your head raised (elevated) when sleeping. This reduces the chance of acid going backward from your stomach into your esophagus.   Most of the time, nonspecific chest pain will improve within 2 to 3 days with rest and mild pain medicine.  HOME CARE INSTRUCTIONS   If antibiotics were prescribed, take your antibiotics as directed. Finish them even if you start to feel better.   For the next few  days, avoid physical activities that bring on chest pain. Continue physical activities as directed.   Do not smoke.   Avoid drinking alcohol.   Only take over-the-counter or prescription medicine for pain, discomfort, or fever as directed by your caregiver.   Follow your caregiver's suggestions for further testing if your chest pain does not go away.   Keep any follow-up appointments you made. If you do not go to an appointment, you could develop lasting (chronic) problems with pain. If there is any problem keeping an appointment, you must call to reschedule.  SEEK MEDICAL CARE IF:   You think you are having problems from the medicine you are taking. Read your medicine instructions carefully.   Your chest pain does not go away, even after treatment.   You develop a rash with blisters on your chest.  SEEK IMMEDIATE MEDICAL CARE IF:   You have increased chest pain or pain that spreads to your arm, neck, jaw, back, or abdomen.   You develop shortness of breath, an increasing cough, or you are coughing up blood.   You have severe back or abdominal pain, feel nauseous, or vomit.   You develop severe weakness, fainting, or chills.   You have a fever.  THIS IS AN EMERGENCY. Do not wait to see if the pain will go away. Get medical help at once. Call your local emergency services (911 in [place] Do not drive yourself to the hospital. MAKE SURE YOU:   Understand these instructions.     Will watch your condition.   Will get help right away if you are not doing well or get worse.  Document Released: 06/07/2005 Document Revised: 08/17/2011 Document Reviewed: 04/02/2008 [facility] Patient Information 2012 [place], [place].  Hypertension Information As your heart beats, it forces blood through your arteries. This force is your blood pressure. If the pressure is too high, it is called hypertension (HTN) or high blood pressure. HTN is dangerous because you may have it and not know it. High blood  pressure may mean that your heart has to work harder to pump blood. Your arteries may be narrow or stiff. The extra work puts you at risk for heart disease, stroke, and other problems.  Blood pressure consists of two numbers, a higher number over a lower, 110/72, for example. It is stated as "110 over 72." The ideal is below 120 for the top number (systolic) and under 80 for the bottom (diastolic).  You should pay close attention to your blood pressure if you have certain conditions such as:  Heart failure.   Prior heart attack.   Diabetes   Chronic kidney disease.   Prior stroke.   Multiple risk factors for heart disease.  To see if you have HTN, your blood pressure should be measured while you are seated with your arm held at the level of the heart. It should be measured at least twice. A one-time elevated blood pressure reading (especially in the Emergency Department) does not mean that you need treatment. There may be conditions in which the blood pressure is different between your right and left arms. It is important to see your caregiver soon for a recheck. Most people have essential hypertension which means that there is not a specific cause. This type of high blood pressure may be lowered by changing lifestyle factors such as:  Stress.   Smoking.   Lack of exercise.   Excessive weight.   Drug/tobacco/alcohol use.   Eating less salt.  Most people do not have symptoms from high blood pressure until it has caused damage to the body. Effective treatment can often prevent, delay or reduce that damage. TREATMENT  Treatment for high blood pressure, when a cause has been identified, is directed at the cause. There are a large number of medications to treat HTN. These fall into several categories, and your caregiver will help you select the medicines that are best for you. Medications may have side effects. You should review side effects with your caregiver. If your blood pressure  stays high after you have made lifestyle changes or started on medicines,   Your medication(s) may need to be changed.   Other problems may need to be addressed.   Be certain you understand your prescriptions, and know how and when to take your medicine.   Be sure to follow up with your caregiver within the time frame advised (usually within two weeks) to have your blood pressure rechecked and to review your medications.   If you are taking more than one medicine to lower your blood pressure, make sure you know how and at what times they should be taken. Taking two medicines at the same time can result in blood pressure that is too low.  Document Released: 10/31/2005 Document Revised: 05/10/2011 Document Reviewed: 11/07/2007 [facility] Patient Information 2012 [place], [place].

## 2012-02-01 NOTE — ED Provider Notes (Addendum)
History     CSN: [id number]  Arrival date & time 02/01/12  2018   First MD Initiated Contact with Patient 02/01/12 2100      No chief complaint on file.   (Consider location/radiation/quality/duration/timing/severity/associated sxs/prior treatment) HPI Comments: Patient presents with chest tightness and headache.  Patient notes that she has a history of hypertension on multiple medications that is poorly controlled.  She's been referred to a specialist at [facility] for further evaluation of this process.  Patient states she's been taking all of her medications as directed.  She states she had an endoscopy on Tuesday and since that time has had some mild headache.  She's also been checking her blood pressures daily and has noted that there consistently in the 150s to 170s.  She notes that previously her blood pressures would be normal on some days and only high and other days.  She comes in because she has some mild chest tightness, the associated headache and a persistently elevated blood pressures which are concerned her.  Patient denies any nausea, vomiting, fevers, cough or palpitations.    The history is provided by the patient. No language interpreter was used.    Past Medical History  Diagnosis Date  . Depression   . Hypertension   . Anemia     iron deficient, microcytic, hypochromic  . Anxiety   . Migraines   . 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 Fibroids     uterine  . Palpitations     recurrent  . Fatigue   . Tachycardia     unspecified  . Chest pain     atypical  . 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 Allena Katz at [facility]- S/p FNA March/april of two dominant nodules- benign.  Following for annual thyroid US with Dr. Allena Katz.      Past Surgical History  Procedure Date  . Appendectomy 03/2009  . Tubal ligation 1997  . Tuboplasty / tubotubal anastomosis 2005  . Biopsy thyroid November 28, 2009    Dr. Allena Katz- endo  . Dilation and curettage of  uterus 05/21/2011    Procedure: DILATATION AND CURETTAGE (D&C);  Surgeon: Robley Fries;  Location: [facility];  Service: Gynecology;  Laterality: N/A;  dilitation and currettage/endometrial currettings    Family History  Problem Relation Age of Onset  . Hypertension Mother   . Cancer Mother 66    breast  . Cancer Father     colon  . Stroke Brother     handicapped due to complications from spinal meningitis  . Seizures Brother   . Asthma Son   . Arthritis Maternal Grandmother   . Hypertension Maternal Grandmother   . Stroke Maternal Grandfather     History  Substance Use Topics  . Smoking status: Never Smoker   . Smokeless tobacco: Never Used  . Alcohol Use: Yes     Once a month    OB History    Grav Para Term Preterm Abortions TAB SAB Ect Mult Living   4 2 1 1 2 1  1  2       Review of Systems  Constitutional: Negative.  Negative for fever and chills.  Eyes: Negative.  Negative for discharge and redness.  Respiratory: Positive for chest tightness. Negative for cough and shortness of breath.   Cardiovascular: Negative.  Negative for chest pain.  Gastrointestinal: Negative.  Negative for nausea, vomiting, abdominal pain and diarrhea.  Genitourinary: Negative.  Negative for  dysuria and vaginal discharge.  Musculoskeletal: Negative.  Negative for back pain.  Skin: Negative.  Negative for color change and rash.  Neurological: Positive for headaches. Negative for syncope.  Hematological: Negative.  Negative for adenopathy.  Psychiatric/Behavioral: Negative.  Negative for confusion.  All other systems reviewed and are negative.    Allergies  Amlodipine and Wellbutrin  Home Medications   Current Outpatient Rx  Name Route Sig Dispense Refill  . ACETAMINOPHEN 500 MG PO TABS Oral Take 1,000 mg by mouth every 6 (six) hours as needed. For headache    . ALPRAZOLAM 1 MG PO TABS Oral Take 1 mg by mouth 3 (three) times daily as needed. For anxiety    . FLUOXETINE HCL 20 MG PO  CAPS Oral Take 20 mg by mouth daily.    . FUROSEMIDE 20 MG PO TABS Oral Take 20 mg by mouth every other day.    Marland Kitchen LABETALOL HCL 100 MG PO TABS Oral Take 1 tablet (100 mg total) by mouth 2 (two) times daily. 60 tablet 11  . LOSARTAN POTASSIUM 100 MG PO TABS Oral Take 1 tablet (100 mg total) by mouth daily. 30 tablet 6  . MULTI-VITAMIN PO Oral Take by mouth daily.    . SUMATRIPTAN SUCCINATE 50 MG PO TABS  as needed. Take 1 tab at start of headache, may repeat once 2 hours later if your symptoms return or do not improve.      BP 181/101  Pulse 76  Temp(Src) 98.3 F (36.8 C) (Oral)  Resp 20  SpO2 99%  Physical Exam  Nursing note and vitals reviewed. Constitutional: She is oriented to person, place, and time. She appears well-developed and well-nourished.  Non-toxic appearance. She does not have a sickly appearance.  HENT:  Head: Normocephalic and atraumatic.  Eyes: Conjunctivae, EOM and lids are normal. Pupils are equal, round, and reactive to light. No scleral icterus.  Neck: Trachea normal and normal range of motion. Neck supple.  Cardiovascular: Normal rate, regular rhythm and normal heart sounds.   Pulmonary/Chest: Effort normal and breath sounds normal. No respiratory distress. She has no wheezes. She has no rales.  Abdominal: Soft. Normal appearance. There is no tenderness. There is no rebound, no guarding and no CVA tenderness.  Musculoskeletal: Normal range of motion.  Neurological: She is alert and oriented to person, place, and time. She has normal strength.  Skin: Skin is warm, dry and intact. No rash noted.  Psychiatric: She has a normal mood and affect. Her behavior is normal. Judgment and thought content normal.    ED Course  Procedures (including critical care time)  Results for orders placed during the hospital encounter of 02/01/12  CBC      Component Value Range   WBC 6.3  4.0 - 10.5 (K/uL)   RBC 4.33  3.87 - 5.11 (MIL/uL)   Hemoglobin 12.8  12.0 - 15.0 (g/dL)    HCT 88.4 (*) 16.6 - 46.0 (%)   MCV 82.4  78.0 - 100.0 (fL)   MCH 29.6  26.0 - 34.0 (pg)   MCHC 35.9  30.0 - 36.0 (g/dL)   RDW 06.3  01.6 - 01.0 (%)   Platelets 196  150 - 400 (K/uL)  DIFFERENTIAL      Component Value Range   Neutrophils Relative 53  43 - 77 (%)   Neutro Abs 3.4  1.7 - 7.7 (K/uL)   Lymphocytes Relative 36  12 - 46 (%)   Lymphs Abs 2.3  0.7 - 4.0 (  K/uL)   Monocytes Relative 9  3 - 12 (%)   Monocytes Absolute 0.6  0.1 - 1.0 (K/uL)   Eosinophils Relative 1  0 - 5 (%)   Eosinophils Absolute 0.1  0.0 - 0.7 (K/uL)   Basophils Relative 0  0 - 1 (%)   Basophils Absolute 0.0  0.0 - 0.1 (K/uL)  BASIC METABOLIC PANEL      Component Value Range   Sodium 139  135 - 145 (mEq/L)   Potassium 4.2  3.5 - 5.1 (mEq/L)   Chloride 103  96 - 112 (mEq/L)   CO2 27  19 - 32 (mEq/L)   Glucose, Bld 90  70 - 99 (mg/dL)   BUN 10  6 - 23 (mg/dL)   Creatinine, Ser 3.08  0.50 - 1.10 (mg/dL)   Calcium 9.7  8.4 - 65.7 (mg/dL)   GFR calc non Af Amer 90 (*) >90 (mL/min)   GFR calc Af Amer >90  >90 (mL/min)  TROPONIN I      Component Value Range   Troponin I <0.30  <0.30 (ng/mL)      Date: 02/01/2012  Rate: 73  Rhythm: normal sinus rhythm  QRS Axis: normal  Intervals: normal  ST/T Wave abnormalities: normal  Conduction Disutrbances:none  Narrative Interpretation:   Old EKG Reviewed: changes noted from 11/18/2011 when patient was in sinus tachycardia at 116    MDM  Patient with known history of hypertension that is poorly controlled which is why she's being referred to a specialist at [facility].  I would not adjust the patient's medications here as she has established followup for this process.  Patient has had headache for several days it was not sudden in onset to suggest subarachnoid hemorrhage.  Patient does not have symptoms of infection to suggest meningitis.  She has no neurologic deficits to suggest intracranial hemorrhage or other stroke-type process.  I will treat the patient's  headache here and given some mild chest tightness we'll check one set of cardiac markers since her pain is been ongoing throughout the day.     Nat Christen, MD 02/01/12 2129  Patient's headache is improving with the medications here.  She has no acute renal dysfunction or electrolyte abnormalities related to her blood pressure medications.  I believe the patient can continue with outpatient management for her hypertension as already scheduled.  Nat Christen, MD 02/01/12 2223

## 2012-03-01 ENCOUNTER — Encounter: Payer: Self-pay | Admitting: Family

## 2012-04-01 ENCOUNTER — Ambulatory Visit: Payer: BC Managed Care – PPO | Admitting: Cardiovascular Disease

## 2012-04-03 ENCOUNTER — Other Ambulatory Visit (HOSPITAL_BASED_OUTPATIENT_CLINIC_OR_DEPARTMENT_OTHER): Payer: BC Managed Care – PPO | Admitting: Lab

## 2012-04-03 ENCOUNTER — Ambulatory Visit: Payer: BC Managed Care – PPO | Admitting: Family

## 2012-04-03 ENCOUNTER — Ambulatory Visit (HOSPITAL_BASED_OUTPATIENT_CLINIC_OR_DEPARTMENT_OTHER): Payer: BC Managed Care – PPO | Admitting: Hematology & Oncology

## 2012-04-03 VITALS — BP 125/86 | HR 107 | Temp 97.4°F | Ht 65.0 in | Wt 180.0 lb

## 2012-04-03 DIAGNOSIS — D509 Iron deficiency anemia, unspecified: Secondary | ICD-10-CM

## 2012-04-03 DIAGNOSIS — R5381 Other malaise: Secondary | ICD-10-CM

## 2012-04-03 DIAGNOSIS — D508 Other iron deficiency anemias: Secondary | ICD-10-CM

## 2012-04-03 DIAGNOSIS — M818 Other osteoporosis without current pathological fracture: Secondary | ICD-10-CM

## 2012-04-03 DIAGNOSIS — N912 Amenorrhea, unspecified: Secondary | ICD-10-CM

## 2012-04-03 DIAGNOSIS — E041 Nontoxic single thyroid nodule: Secondary | ICD-10-CM

## 2012-04-03 LAB — CBC WITH DIFFERENTIAL (CANCER CENTER ONLY)
BASO#: 0 10*3/uL (ref 0.0–0.2)
EOS%: 0.6 % (ref 0.0–7.0)
HCT: 38.8 % (ref 34.8–46.6)
HGB: 13.8 g/dL (ref 11.6–15.9)
LYMPH#: 1.9 10*3/uL (ref 0.9–3.3)
LYMPH%: 29 % (ref 14.0–48.0)
MCHC: 35.6 g/dL (ref 32.0–36.0)
MCV: 84 fL (ref 81–101)
MONO#: 0.5 10*3/uL (ref 0.1–0.9)
NEUT%: 63.2 % (ref 39.6–80.0)
RDW: 13.5 % (ref 11.1–15.7)

## 2012-04-03 NOTE — Progress Notes (Signed)
This office note has been dictated.

## 2012-04-04 LAB — RETICULOCYTES (CHCC)
RBC.: 4.53 MIL/uL (ref 3.87–5.11)
Retic Ct Pct: 1.4 % (ref 0.4–2.3)

## 2012-04-04 LAB — IRON AND TIBC
%SAT: 26 % (ref 20–55)
TIBC: 309 ug/dL (ref 250–470)

## 2012-04-04 LAB — VITAMIN D 25 HYDROXY (VIT D DEFICIENCY, FRACTURES): Vit D, 25-Hydroxy: 39 ng/mL (ref 30–89)

## 2012-04-04 NOTE — Progress Notes (Signed)
CC:   Michele [name]. Michele Pais, DO  DIAGNOSIS:  Recurrent iron-deficiency anemia.  CURRENT THERAPY:  IV iron with Feraheme, last given in September 2012.  INTERIM HISTORY:  [name] comes in for a long-awaited followup.  I last saw her myself back in April 2012.  Since then, she has probably seen about 4 other specialists.  She has seen a cardiologist, a pulmonologist, I think thyroid.  She has not had menstrual cycle for I think over a half a year.  She is not postmenopausal.  She says that her hormone levels were okay.  We last gave her iron back in September.  She responded well to this.  She said that she feels very tired.  She has muscle aches and pains. She has bony pain.  She says her [name] is coming out.  I told her that I do not think any of this was from low iron.  She is on thyroid.  She said she started thyroid medicine a month or so ago.  She has a couple of thyroid nodules that are being followed.  She has lost some weight.  She says she has lost 20 pounds since the beginning of the year.  There is no change in bowel or bladder habits.  She has occasional headache.  She says there are some swallowing difficulties.  There has been no double vision.  She has not noticed any rashes.  She says she had thrush recently.  She was given some medicine for this.  PHYSICAL EXAMINATION:  This is a well-developed, well-nourished black female in no obvious distress.  Vital signs:  Temperature of 97.8, pulse 107, respiratory rate 22, blood pressure 125/86.  Weight is 180.  Head and neck:  Normocephalic, atraumatic skull.  There is no scleral icterus.  There are no oral lesions.  She has no thrush.  There is no adenopathy in her neck.  Lungs:  Clear bilaterally.  Cardiac:  Regular rate and rhythm with a normal S1 and S2.  There are no murmurs, rubs or bruits.  Abdomen:  Soft with good bowel sounds.  There is no palpable abdominal mass.  There is no fluid wave.  There is no  palpable hepatosplenomegaly.  Back:  No tenderness of the spine, ribs, or hips. Extremities:  No clubbing, cyanosis or edema.  Neurologic:  No focal neurological deficits.  LABORATORY STUDIES:  White cell count is 6.6, hemoglobin 13.8, hematocrit 38.8, platelet count 239.  MCV is 84.  CT angio of the neck back in April showed no carotid, subclavian or vertebral artery stenosis.  She does have the bilateral thyroid lesions.  IMPRESSION:  [name] is a 41 year old African American female.  She has history of recurrent iron-deficiency anemia from menometrorrhagia.  Again, she has not had a menstrual cycle in I guess over 6 months, so she is not anemic because of this.  I am not sure how to explain all of her other health problems.  Again, I could not imagine these being from low iron.  We will check her vitamin D level.  Her arthralgias may be from low vitamin D.  I do think she may need to have a bone density test done.  This, I think, would make sense if she is not having any menstrual cycles.  I suppose her ovarian function could be "postmenopausal."  We will get her back in a month or so just for followup.    ______________________________ Josph Macho, M.D. PRE/MEDQ  D:  04/03/2012  T:  04/04/2012  Job:  [id number]

## 2012-04-05 ENCOUNTER — Ambulatory Visit: Payer: BC Managed Care – PPO | Admitting: Family

## 2012-04-10 ENCOUNTER — Ambulatory Visit
Admission: RE | Admit: 2012-04-10 | Discharge: 2012-04-10 | Disposition: A | Discharge status: Home / Self Care | Payer: BC Managed Care – PPO | Source: Ambulatory Visit | Attending: Hematology & Oncology | Admitting: Hematology & Oncology

## 2012-04-10 DIAGNOSIS — M818 Other osteoporosis without current pathological fracture: Secondary | ICD-10-CM

## 2012-04-15 ENCOUNTER — Telehealth: Payer: Self-pay | Admitting: *Deleted

## 2012-04-15 NOTE — Telephone Encounter (Signed)
Called patient to let her know that her iron levels were ok per dr. enenver 

## 2012-04-15 NOTE — Telephone Encounter (Signed)
Message copied by Anselm Jungling on Mon Apr 15, 2012 10:07 AM ------      Message from: Josph Macho      Created: Sat Apr 13, 2012 12:23 PM       Call - iron is ok!!!  pete

## 2012-04-25 ENCOUNTER — Encounter: Payer: Self-pay | Admitting: Internal Medicine

## 2012-04-25 ENCOUNTER — Ambulatory Visit (INDEPENDENT_AMBULATORY_CARE_PROVIDER_SITE_OTHER): Payer: BC Managed Care – PPO | Admitting: Internal Medicine

## 2012-04-25 VITALS — BP 112/82 | HR 82 | Temp 97.9°F | Resp 14 | Wt 183.0 lb

## 2012-04-25 DIAGNOSIS — H6690 Otitis media, unspecified, unspecified ear: Secondary | ICD-10-CM | POA: Insufficient documentation

## 2012-04-25 MED ORDER — AMOXICILLIN 875 MG PO TABS: 875.0000 mg | ORAL_TABLET | Freq: Two times a day (BID) | ORAL | Status: AC

## 2012-04-25 NOTE — Progress Notes (Signed)
  Subjective:    Patient ID: Michele [name], female    DOB: 01-30-1971, 41 y.o.   MRN: [id number]  HPI Pt presents to clinic for evaluation of ear pain. Notes 3 d h/o left ear pain without fever, chills, drainage or cough. Has mild left ST and left gland soreness. Taking no medication for the problem.   Past Medical History  Diagnosis Date  . Depression   . Hypertension   . Anemia     iron deficient, microcytic, hypochromic  . Anxiety   . Migraines   . 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 Fibroids     uterine  . Palpitations     recurrent  . Fatigue   . Tachycardia     unspecified  . Chest pain     atypical  . 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 Allena Katz at [facility]- S/p FNA March/april of two dominant nodules- benign.  Following for annual thyroid US with Dr. Allena Katz.     Past Surgical History  Procedure Date  . Appendectomy 03/2009  . Tubal ligation 1997  . Tuboplasty / tubotubal anastomosis 2005  . Biopsy thyroid November 28, 2009    Dr. Allena Katz- endo  . Dilation and curettage of uterus 05/21/2011    Procedure: DILATATION AND CURETTAGE (D&C);  Surgeon: Robley Fries;  Location: [facility];  Service: Gynecology;  Laterality: N/A;  dilitation and currettage/endometrial currettings    reports that she has never smoked. She has never used smokeless tobacco. She reports that she drinks alcohol. She reports that she does not use illicit drugs. family history includes Arthritis in her maternal grandmother; Asthma in her son; Cancer in her father; Cancer (age of onset:49) in her mother; Hypertension in her maternal grandmother and mother; Seizures in her brother; and Stroke in her brother and maternal grandfather. Allergies  Allergen Reactions  . Amlodipine Shortness Of Breath and Palpitations  . Wellbutrin (Bupropion Hcl) Other (See Comments)    Dizziness, increased anxiety     Review of Systems see hpi     Objective:   Physical Exam  Nursing note  and vitals reviewed. Constitutional: She appears well-developed and well-nourished. No distress.  HENT:  Head: Normocephalic and atraumatic.  Right Ear: Tympanic membrane, external ear and ear canal normal.  Left Ear: External ear and ear canal normal. No drainage. Tympanic membrane is retracted. Tympanic membrane is not injected, not perforated, not erythematous and not bulging.  Nose: Nose normal.  Neck: Neck supple.  Lymphadenopathy:    She has no cervical adenopathy.  Neurological: She is alert.  Skin: She is not diaphoretic.  Psychiatric: She has a normal mood and affect.          Assessment & Plan:

## 2012-04-25 NOTE — Assessment & Plan Note (Signed)
Begin abx course. Followup if no improvement or worsening. 

## 2012-05-02 ENCOUNTER — Ambulatory Visit: Payer: BC Managed Care – PPO | Admitting: Hematology & Oncology

## 2012-05-02 ENCOUNTER — Other Ambulatory Visit: Payer: BC Managed Care – PPO | Admitting: Lab

## 2012-05-03 ENCOUNTER — Ambulatory Visit: Payer: BC Managed Care – PPO | Admitting: Medical

## 2012-05-03 ENCOUNTER — Other Ambulatory Visit: Payer: BC Managed Care – PPO | Admitting: Lab

## 2012-05-09 ENCOUNTER — Other Ambulatory Visit (HOSPITAL_BASED_OUTPATIENT_CLINIC_OR_DEPARTMENT_OTHER): Payer: BC Managed Care – PPO | Admitting: Lab

## 2012-05-09 ENCOUNTER — Ambulatory Visit (HOSPITAL_BASED_OUTPATIENT_CLINIC_OR_DEPARTMENT_OTHER): Payer: BC Managed Care – PPO | Admitting: Hematology & Oncology

## 2012-05-09 ENCOUNTER — Ambulatory Visit (HOSPITAL_BASED_OUTPATIENT_CLINIC_OR_DEPARTMENT_OTHER): Payer: BC Managed Care – PPO

## 2012-05-09 VITALS — BP 130/85 | HR 89 | Temp 98.0°F | Resp 20 | Ht 65.0 in | Wt 187.0 lb

## 2012-05-09 DIAGNOSIS — R197 Diarrhea, unspecified: Secondary | ICD-10-CM

## 2012-05-09 DIAGNOSIS — D508 Other iron deficiency anemias: Secondary | ICD-10-CM

## 2012-05-09 DIAGNOSIS — D509 Iron deficiency anemia, unspecified: Secondary | ICD-10-CM

## 2012-05-09 DIAGNOSIS — R1033 Periumbilical pain: Secondary | ICD-10-CM

## 2012-05-09 DIAGNOSIS — R52 Pain, unspecified: Secondary | ICD-10-CM

## 2012-05-09 DIAGNOSIS — R072 Precordial pain: Secondary | ICD-10-CM

## 2012-05-09 DIAGNOSIS — R0789 Other chest pain: Secondary | ICD-10-CM

## 2012-05-09 LAB — CBC WITH DIFFERENTIAL (CANCER CENTER ONLY)
BASO%: 0.2 % (ref 0.0–2.0)
EOS%: 1.4 % (ref 0.0–7.0)
LYMPH%: 26.8 % (ref 14.0–48.0)
MCH: 30.4 pg (ref 26.0–34.0)
MCHC: 35.1 g/dL (ref 32.0–36.0)
MCV: 87 fL (ref 81–101)
MONO%: 6.9 % (ref 0.0–13.0)
NEUT#: 4.2 10*3/uL (ref 1.5–6.5)
Platelets: 241 10*3/uL (ref 145–400)
RDW: 13.7 % (ref 11.1–15.7)

## 2012-05-09 MED ORDER — SODIUM CHLORIDE 0.9 % IV SOLN
Freq: Once | INTRAVENOUS | Status: AC
  Administered 2012-05-09: 10:00:00 via INTRAVENOUS

## 2012-05-09 MED ORDER — SODIUM CHLORIDE 0.9 % IV SOLN
1020.0000 mg | Freq: Once | INTRAVENOUS | Status: AC
  Administered 2012-05-09: 1020 mg via INTRAVENOUS
  Filled 2012-05-09: qty 34

## 2012-05-09 NOTE — Progress Notes (Signed)
AndThis office note has been dictated.

## 2012-05-09 NOTE — Progress Notes (Signed)
DIAGNOSIS:  Recurrent iron deficiency anemia.  CURRENT THERAPY:  IV iron as indicated.  INTERIM HISTORY:  [name] comes in for followup.  She is not feeling well.  She still feels tired.  She just has had these issues. She has had chest wall pain.  She has had muscle pain.  She has been seeing other physicians.  She has not had a monthly cycle now for about a year.  She got iron about a year ago.  She did well with the iron.  She has had no rashes.  Her appetite has been okay.  She has had no fever.  PHYSICAL EXAMINATION:  General:  This is a well-developed, well- nourished, African American female in no obvious distress.  Vital Signs: 98.6, pulse 89, respiratory rate 20, blood pressure 130/85.  Weight is 187.  Head and Neck:  Normocephalic, atraumatic skull.  There are no ocular or oral lesions.  Lymph:  There are no palpable cervical or supraclavicular lymph nodes.  Lungs:  Clear bilaterally.  Cardiac: Regular rate and rhythm with a normal S1 and S2.  There are no murmurs, rubs, or bruits.  Abdomen:  Soft with good bowel sounds.  There is no fluid wave.  There is no palpable abdominal mass.  There is no palpable hepatosplenomegaly.  Back:  No tenderness over the spine, ribs, or hips. Extremities:  Some tenderness over the muscles in the lower legs.  She has no venous cord in her lower legs.  She has good pulses in her distal extremities.  Skin:  No rashes, ecchymoses, or petechia.  Neurological: No focal neurological deficits.  LABORATORY STUDIES:  White cell count 6.5, hemoglobin 13.7 and 39, platelet count 241.  MCV is 87.  Ferritin was 23.  Iron saturation is 14.  Iron is 48.  IMPRESSION:  [name] is a 41 year old African American female with recurrent iron deficiency anemia.  Her symptoms certainly could reflect low iron.  I realize that she is not anemic.  However, it is likely that her body is iron deficient and that she would benefit from IV  iron.  We will go and give her some IV iron.  I am going to go ahead and get her set up with a CT scan of the chest, abdomen, and pelvis.  She is a little bit bloated.  I am not sure, again, why she is having all these symptoms.  She has not had any evaluation for these.  I just do not see any additional blood work that we can do that would help [name] out.  We will see about doing scans.  If these all normal then I would clearly refer her back to Sandford Craze, NP, and see if she can help figure out what is going.  I want see her back in 1 month.  Again, I hope the patient feels better. We will give her Feraheme 1020 mg.    ______________________________ Josph Macho, M.D. PRE/MEDQ  D:  05/09/2012  T:  05/09/2012  Job:  [id number]

## 2012-05-09 NOTE — Patient Instructions (Signed)
Ferumoxytol injection What is this medicine? FERUMOXYTOL is an iron complex. Iron is used to make healthy red blood cells, which carry oxygen and nutrients throughout the body. This medicine is used to treat iron deficiency anemia in people with chronic kidney disease. This medicine may be used for other purposes; ask your health care provider or pharmacist if you have questions. What should I tell my health care provider before I take this medicine? They need to know if you have any of these conditions: -anemia not caused by low iron levels -high levels of iron in the blood -magnetic resonance imaging (MRI) test scheduled -an unusual or allergic reaction to iron, other medicines, foods, dyes, or preservatives -pregnant or trying to get pregnant -breast-feeding How should I use this medicine? This medicine is for infusion into a vein. It is given by a health care professional in a hospital or clinic setting. Talk to your pediatrician regarding the use of this medicine in children. Special care may be needed. Overdosage: If you think you've taken too much of this medicine contact a poison control center or emergency room at once. Overdosage: If you think you have taken too much of this medicine contact a poison control center or emergency room at once. NOTE: This medicine is only for you. Do not share this medicine with others. What if I miss a dose? It is important not to miss your dose. Call your doctor or health care professional if you are unable to keep an appointment. What may interact with this medicine? This medicine may interact with the following medications: -other iron products This list may not describe all possible interactions. Give your health care provider a list of all the medicines, herbs, non-prescription drugs, or dietary supplements you use. Also tell them if you smoke, drink alcohol, or use illegal drugs. Some items may interact with your medicine. What should I watch  for while using this medicine? Visit your doctor or healthcare professional regularly. Tell your doctor or healthcare professional if your symptoms do not start to get better or if they get worse. You may need blood work done while you are taking this medicine. You may need to follow a special diet. Talk to your doctor. Foods that contain iron include: whole grains/cereals, dried fruits, beans, or peas, leafy green vegetables, and organ meats (liver, kidney). What side effects may I notice from receiving this medicine? Side effects that you should report to your doctor or health care professional as soon as possible: -allergic reactions like skin rash, itching or hives, swelling of the face, lips, or tongue -breathing problems -changes in blood pressure -feeling faint or lightheaded, falls -fever or chills -flushing, sweating, or hot feelings -swelling of the ankles or feet Side effects that usually do not require medical attention (Report these to your doctor or health care professional if they continue or are bothersome.): -diarrhea -headache -nausea, vomiting -stomach pain This list may not describe all possible side effects. Call your doctor for medical advice about side effects. You may report side effects to FDA at 1-800-FDA-1088. Where should I keep my medicine? This drug is given in a hospital or clinic and will not be stored at home. NOTE: This sheet is a summary. It may not cover all possible information. If you have questions about this medicine, talk to your doctor, pharmacist, or health care provider.  2012, Elsevier/Gold Standard. (05/20/2008 9:48:25 PM) 

## 2012-05-09 NOTE — Patient Instructions (Signed)
We will give me iron today.  We will get you set up with a CT scan to check for your chest pain and abdominal pain.  Cannot get dehydrated.  Come back to see [name] in one month. Which will give you the schedule.

## 2012-05-10 LAB — VITAMIN D 25 HYDROXY (VIT D DEFICIENCY, FRACTURES): Vit D, 25-Hydroxy: 40 ng/mL (ref 30–89)

## 2012-05-10 LAB — RETICULOCYTES (CHCC): ABS Retic: 69 10*3/uL (ref 19.0–186.0)

## 2012-05-10 LAB — IRON AND TIBC: Iron: 48 ug/dL (ref 42–145)

## 2012-05-10 LAB — FERRITIN: Ferritin: 23 ng/mL (ref 10–291)

## 2012-05-14 ENCOUNTER — Ambulatory Visit (INDEPENDENT_AMBULATORY_CARE_PROVIDER_SITE_OTHER): Payer: BC Managed Care – PPO | Admitting: Cardiovascular Disease

## 2012-05-14 ENCOUNTER — Encounter: Payer: Self-pay | Admitting: Cardiovascular Disease

## 2012-05-14 VITALS — BP 130/96 | HR 80 | Ht 64.5 in | Wt 186.0 lb

## 2012-05-14 DIAGNOSIS — I10 Essential (primary) hypertension: Secondary | ICD-10-CM

## 2012-05-14 DIAGNOSIS — R072 Precordial pain: Secondary | ICD-10-CM | POA: Insufficient documentation

## 2012-05-14 DIAGNOSIS — R609 Edema, unspecified: Secondary | ICD-10-CM

## 2012-05-14 NOTE — Assessment & Plan Note (Signed)
Blood pressure remains elevated. She is not as tachycardic as she has been in the past. Management per the hypertension clinic at [facility].

## 2012-05-14 NOTE — Assessment & Plan Note (Signed)
The patient has a progressive chest pain syndrome. She has been unable to engage in any exercise because of symptoms with even low level activities. Stress testing has been negative in the past. She has a multitude of symptoms that include dyspnea, edema, and chest pain. All of these are worsening. She's been tried on multiple different medications and we really have not found an effective treatment for her. I believe she would benefit from a definitive evaluation. We discussed further diagnostic studies in depth today. She understands the risks, indications, and alternatives to cardiac catheterization and possible PCI. I would favor proceeding with a right and left heart catheterization for definitive evaluation of her chest pain, shortness of breath, and edema.

## 2012-05-14 NOTE — Progress Notes (Signed)
HPI:  41 year old woman presenting for followup evaluation. She continues to have problems with chest pain. 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 She has really become debilitated with her chest pain. She can't do any physical activity without chest discomfort, fatigue, palpitations, or shortness of breath. She's had an event recorder done last year that showed no significant arrhythmia. Chest pain symptoms occur with emotional and physical stress. She feels a pressure across her anterior chest. She has associated symptoms of diaphoresis, shortness of breath, and lightheadedness. She has not had frank syncope. She also complains of abdominal and lower extremity edema. She had an emergency room visit earlier this year for chest pain and had a CT scan of the chest performed. This demonstrated no evidence of pulmonary emboli. The patient is iron deficient and she is undergoing Feraheme infusions. Dr Myna Hidalgo scheduled her for a CT scan of the chest, abdomen, and pelvis. The patient's blood pressure has been labile. She has established at the nephrology clinic at [facility] and medication adjustments were made after her first visit here. I started her on labetalol and I saw her last, but this is been discontinued. She has been started on Aldactone and she continues on losartan and chlorthalidone.  Outpatient Encounter Prescriptions as of 05/14/2012  Medication Sig Dispense Refill  . acetaminophen (TYLENOL) 500 MG tablet Take 1,000 mg by mouth every 6 (six) hours as needed. For headache      . ALPRAZolam (XANAX) 1 MG tablet Take 1 mg by mouth 3 (three) times daily as needed. For anxiety      . aspirin (GNP ASPIRIN) 81 MG EC tablet Take 81 mg by mouth daily. Swallow whole.      Marland Kitchen BIOTIN PO Take 100 mg by mouth every morning.      . chlorthalidone (HYGROTON) 25 MG tablet Take 25 mg by mouth daily.      Marland Kitchen  FLUoxetine (PROZAC) 20 MG capsule Take 20 mg by mouth daily.      . furosemide (LASIX) 20 MG tablet Take 20 mg by mouth every other day.      . levothyroxine (LEVOTHROID) 25 MCG tablet Take 25 mcg by mouth daily.      Marland Kitchen losartan (COZAAR) 100 MG tablet Take 1 tablet (100 mg total) by mouth daily.  30 tablet  6  . Multiple Vitamin (MULTI-VITAMIN PO) Take by mouth daily.      Marland Kitchen spironolactone (ALDACTONE) 25 MG tablet Take 25 mg by mouth daily.      . sucralfate (CARAFATE) 1 G tablet Take 1 g by mouth 4 (four) times daily.      . SUMAtriptan (IMITREX) 50 MG tablet as needed. Take 1 tab at start of headache, may repeat once 2 hours later if your symptoms return or do not improve.        Allergies  Allergen Reactions  . Amlodipine Shortness Of Breath and Palpitations  . Wellbutrin (Bupropion Hcl) Other (See Comments)    Dizziness, increased anxiety    Past Medical History  Diagnosis Date  . Depression   . Hypertension   . Anemia     iron deficient, microcytic, hypochromic  . Anxiety   . Migraines   . 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 Fibroids     uterine  . Palpitations     recurrent  . Fatigue   . Tachycardia     unspecified  .  Chest pain     atypical  . 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 Allena Katz at [facility]- S/p FNA March/april of two dominant nodules- benign.  Following for annual thyroid US with Dr. Allena Katz.      ROS: Negative except as per HPI  BP 130/96  Pulse 80  Ht 5' 4.5" (1.638 m)  Wt 84.369 kg (186 lb)  BMI 31.43 kg/m2  PHYSICAL EXAM: Pt is alert and oriented, pleasant young woman in NAD HEENT: normal Neck: JVP - normal, carotids 2+= without bruits Lungs: CTA bilaterally CV: RRR without murmur or gallop Abd: soft, NT, Positive BS, no hepatomegaly Ext: trace lower extremity edema bilaterally, distal pulses intact and equal Skin: warm/dry no rash  EKG:  Normal sinus rhythm 80 beats per minute, within normal limits  ASSESSMENT  AND PLAN:

## 2012-05-14 NOTE — Patient Instructions (Addendum)
Your physician has requested that you have a cardiac catheterization. Cardiac catheterization is used to diagnose and/or treat various heart conditions. Doctors may recommend this procedure for a number of different reasons. The most common reason is to evaluate chest pain. Chest pain can be a symptom of coronary artery disease (CAD), and cardiac catheterization can show whether plaque is narrowing or blocking your heart's arteries. This procedure is also used to evaluate the valves, as well as measure the blood flow and oxygen levels in different parts of your heart. For further information please visit [email or web address] Please follow instruction sheet, as given.  Your physician recommends that you continue on your current medications as directed. Please refer to the Current Medication list given to you today.  Your physician recommends that you return for lab work on 05/22/12 or 05/23/12 (Pre-procedure labs)  Your physician wants you to follow-up in: 6 MONTHS.  You will receive a reminder letter in the mail two months in advance. If you don't receive a letter, please call our office to schedule the follow-up appointment.

## 2012-05-17 ENCOUNTER — Ambulatory Visit (HOSPITAL_BASED_OUTPATIENT_CLINIC_OR_DEPARTMENT_OTHER)
Admission: RE | Admit: 2012-05-17 | Discharge: 2012-05-17 | Disposition: A | Discharge status: Home / Self Care | Payer: BC Managed Care – PPO | Source: Ambulatory Visit | Attending: Hematology & Oncology | Admitting: Hematology & Oncology

## 2012-05-17 ENCOUNTER — Inpatient Hospital Stay (HOSPITAL_BASED_OUTPATIENT_CLINIC_OR_DEPARTMENT_OTHER): Admission: RE | Admit: 2012-05-17 | Payer: BC Managed Care – PPO | Source: Ambulatory Visit

## 2012-05-17 DIAGNOSIS — R0789 Other chest pain: Secondary | ICD-10-CM

## 2012-05-17 DIAGNOSIS — R142 Eructation: Secondary | ICD-10-CM | POA: Insufficient documentation

## 2012-05-17 DIAGNOSIS — R197 Diarrhea, unspecified: Secondary | ICD-10-CM | POA: Insufficient documentation

## 2012-05-17 DIAGNOSIS — R509 Fever, unspecified: Secondary | ICD-10-CM | POA: Insufficient documentation

## 2012-05-17 DIAGNOSIS — R1033 Periumbilical pain: Secondary | ICD-10-CM

## 2012-05-17 DIAGNOSIS — R109 Unspecified abdominal pain: Secondary | ICD-10-CM | POA: Insufficient documentation

## 2012-05-17 DIAGNOSIS — D509 Iron deficiency anemia, unspecified: Secondary | ICD-10-CM

## 2012-05-17 DIAGNOSIS — R141 Gas pain: Secondary | ICD-10-CM | POA: Insufficient documentation

## 2012-05-17 DIAGNOSIS — R072 Precordial pain: Secondary | ICD-10-CM | POA: Insufficient documentation

## 2012-05-17 DIAGNOSIS — R143 Flatulence: Secondary | ICD-10-CM | POA: Insufficient documentation

## 2012-05-17 MED ORDER — IOHEXOL 300 MG/ML  SOLN
100.0000 mL | Freq: Once | INTRAMUSCULAR | Status: AC | PRN
  Administered 2012-05-17: 100 mL via INTRAVENOUS

## 2012-05-20 ENCOUNTER — Telehealth: Payer: Self-pay | Admitting: *Deleted

## 2012-05-20 ENCOUNTER — Other Ambulatory Visit: Payer: Self-pay | Admitting: Cardiovascular Disease

## 2012-05-20 NOTE — Telephone Encounter (Signed)
Called patient to let her know that her CT scan was completely normal per dr. Myna Hidalgo

## 2012-05-22 ENCOUNTER — Other Ambulatory Visit: Payer: BC Managed Care – PPO

## 2012-05-23 ENCOUNTER — Encounter (HOSPITAL_BASED_OUTPATIENT_CLINIC_OR_DEPARTMENT_OTHER): Payer: Self-pay | Admitting: *Deleted

## 2012-05-23 ENCOUNTER — Other Ambulatory Visit: Payer: Self-pay

## 2012-05-23 ENCOUNTER — Other Ambulatory Visit (INDEPENDENT_AMBULATORY_CARE_PROVIDER_SITE_OTHER): Payer: BC Managed Care – PPO

## 2012-05-23 ENCOUNTER — Emergency Department (HOSPITAL_BASED_OUTPATIENT_CLINIC_OR_DEPARTMENT_OTHER)
Admission: EM | Admit: 2012-05-23 | Discharge: 2012-05-23 | Disposition: A | Discharge status: Home / Self Care | Payer: BC Managed Care – PPO | Attending: Emergency Medicine | Admitting: Emergency Medicine

## 2012-05-23 ENCOUNTER — Telehealth: Payer: Self-pay | Admitting: Physician Assistant

## 2012-05-23 DIAGNOSIS — Z0189 Encounter for other specified special examinations: Secondary | ICD-10-CM

## 2012-05-23 DIAGNOSIS — D649 Anemia, unspecified: Secondary | ICD-10-CM | POA: Insufficient documentation

## 2012-05-23 DIAGNOSIS — F411 Generalized anxiety disorder: Secondary | ICD-10-CM | POA: Insufficient documentation

## 2012-05-23 DIAGNOSIS — I10 Essential (primary) hypertension: Secondary | ICD-10-CM | POA: Insufficient documentation

## 2012-05-23 DIAGNOSIS — R072 Precordial pain: Secondary | ICD-10-CM

## 2012-05-23 DIAGNOSIS — R609 Edema, unspecified: Secondary | ICD-10-CM

## 2012-05-23 DIAGNOSIS — E875 Hyperkalemia: Secondary | ICD-10-CM | POA: Insufficient documentation

## 2012-05-23 LAB — CBC WITH DIFFERENTIAL/PLATELET
Basophils Absolute: 0 10*3/uL (ref 0.0–0.1)
Basophils Relative: 0.2 % (ref 0.0–3.0)
Eosinophils Absolute: 0.1 10*3/uL (ref 0.0–0.7)
Lymphocytes Relative: 29.7 % (ref 12.0–46.0)
MCHC: 33 g/dL (ref 30.0–36.0)
MCV: 90.3 fl (ref 78.0–100.0)
Monocytes Absolute: 0.5 10*3/uL (ref 0.1–1.0)
Neutrophils Relative %: 61.4 % (ref 43.0–77.0)
Platelets: 190 10*3/uL (ref 150.0–400.0)
RBC: 4.56 Mil/uL (ref 3.87–5.11)
RDW: 13.7 % (ref 11.5–14.6)

## 2012-05-23 LAB — PROTIME-INR
INR: 1 ratio (ref 0.8–1.0)
Prothrombin Time: 10.5 s (ref 10.2–12.4)

## 2012-05-23 LAB — BASIC METABOLIC PANEL
BUN: 12 mg/dL (ref 6–23)
BUN: 10 mg/dL (ref 6–23)
CO2: 26 mEq/L (ref 19–32)
CO2: 23 mEq/L (ref 19–32)
Calcium: 9.7 mg/dL (ref 8.4–10.5)
Calcium: 9.3 mg/dL (ref 8.4–10.5)
Creatinine, Ser: 0.8 mg/dL (ref 0.4–1.2)
Creatinine, Ser: 0.7 mg/dL (ref 0.50–1.10)

## 2012-05-23 NOTE — Telephone Encounter (Signed)
Rec'd a call from the lab that her K+ was 6.2 and not hemolyzed.  Called pt, she stated she feels bad. She has already taken all her daily meds. Advised her she needed to be checked and requested she go to [facility] so her records would be available. She agreed.

## 2012-05-23 NOTE — ED Provider Notes (Signed)
History     CSN: [id number]  Arrival date & time 05/23/12  2208   None     Chief Complaint  Patient presents with  . High potassium     (Consider location/radiation/quality/duration/timing/severity/associated sxs/prior treatment) HPI Is a 41 year old black female with an 8 month history of chest pain. She is scheduled to have cardiac catheterization in 4 days. She had routine preprocedural labs drawn today. She got a call about 9:30 telling her that her potassium was dangerously high and that she should be evaluated in an emergency department. She was not told how high her potassium was. She has constant chest pain, palpitations and mild dyspnea. None of these have changed acutely. There are no known mitigating or exacerbating factors.  Past Medical History  Diagnosis Date  . Depression   . Hypertension   . Anemia     iron deficient, microcytic, hypochromic  . Anxiety   . Migraines   . 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 Fibroids     uterine  . Palpitations     recurrent  . Fatigue   . Tachycardia     unspecified  . Chest pain     atypical  . 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 Allena Katz at [facility]- S/p FNA March/april of two dominant nodules- benign.  Following for annual thyroid US with Dr. Allena Katz.      Past Surgical History  Procedure Date  . Appendectomy 03/2009  . Tubal ligation 1997  . Tuboplasty / tubotubal anastomosis 2005  . Biopsy thyroid November 28, 2009    Dr. Allena Katz- endo  . Dilation and curettage of uterus 05/21/2011    Procedure: DILATATION AND CURETTAGE (D&C);  Surgeon: Robley Fries;  Location: [facility];  Service: Gynecology;  Laterality: N/A;  dilitation and currettage/endometrial currettings    Family History  Problem Relation Age of Onset  . Hypertension Mother   . Cancer Mother 29    breast  . Cancer Father     colon  . Stroke Brother     handicapped due to complications from spinal meningitis  . Seizures Brother   .  Asthma Son   . Arthritis Maternal Grandmother   . Hypertension Maternal Grandmother   . Stroke Maternal Grandfather     History  Substance Use Topics  . Smoking status: Never Smoker   . Smokeless tobacco: Never Used  . Alcohol Use: Yes     Once a month    OB History    Grav Para Term Preterm Abortions TAB SAB Ect Mult Living   4 2 1 1 2 1  1  2       Review of Systems  All other systems reviewed and are negative.    Allergies  Amlodipine and Wellbutrin  Home Medications   Current Outpatient Rx  Name Route Sig Dispense Refill  . ACETAMINOPHEN 500 MG PO TABS Oral Take 1,000-1,500 mg by mouth every 6 (six) hours as needed. For headache    . ALPRAZOLAM 1 MG PO TABS Oral Take 0.5-1 mg by mouth 3 (three) times daily as needed. For anxiety    . ASPIRIN 81 MG PO TBEC Oral Take 81 mg by mouth daily.     Marland Kitchen BIOTIN PO Oral Take 1 tablet by mouth daily.     Marland Kitchen FLUOXETINE HCL 20 MG PO CAPS Oral Take 20 mg by mouth daily.    . FUROSEMIDE 20 MG PO TABS Oral Take 20 mg  by mouth every other day.    Marland Kitchen LEVOTHYROXINE SODIUM 25 MCG PO TABS Oral Take 25 mcg by mouth daily.    Marland Kitchen LOSARTAN POTASSIUM 100 MG PO TABS Oral Take 1 tablet (100 mg total) by mouth daily. 30 tablet 6  . MULTI-VITAMIN PO Oral Take 1 tablet by mouth daily.     Marland Kitchen SPIRONOLACTONE 25 MG PO TABS Oral Take 25 mg by mouth daily.    . SUCRALFATE 1 G PO TABS Oral Take 1 g by mouth 4 (four) times daily as needed. For indigestion      BP 138/98  Pulse 94  Temp 97.9 F (36.6 C) (Oral)  Resp 20  SpO2 100%  LMP 05/17/2012  Physical Exam General: Well-developed, well-nourished female in no acute distress; appearance consistent with age of record HENT: normocephalic, atraumatic Eyes: pupils equal round and reactive to light; extraocular muscles intact Neck: supple Heart: regular rate and rhythm; no murmurs, rubs or gallops Lungs: clear to auscultation bilaterally Abdomen: soft; nondistended; nontender; bowel sounds  present Extremities: No deformity; full range of motion; pulses normal; no edema Neurologic: Awake, alert and oriented; motor function intact in all extremities and symmetric; no facial droop Skin: Warm and dry Psychiatric: Normal mood and affect    ED Course  Procedures (including critical care time)    MDM   Nursing notes and vitals signs, including pulse oximetry, reviewed.  Summary of this visit's results, reviewed by myself:  Labs:  Results for orders placed during the hospital encounter of 05/23/12  BASIC METABOLIC PANEL      Component Value Range   Sodium 138  135 - 145 mEq/L   Potassium 3.7  3.5 - 5.1 mEq/L   Chloride 103  96 - 112 mEq/L   CO2 23  19 - 32 mEq/L   Glucose, Bld 157 (*) 70 - 99 mg/dL   BUN 10  6 - 23 mg/dL   Creatinine, Ser 4.54  0.50 - 1.10 mg/dL   Calcium 9.3  8.4 - 09.8 mg/dL   GFR calc non Af Amer >90  >90 mL/min   GFR calc Af Amer >90  >90 mL/min      Date: 05/23/2012 10:32 PM  Rate: 85  Rhythm: normal sinus rhythm  QRS Axis: normal  Intervals: normal  ST/T Wave abnormalities: normal  Conduction Disutrbances: none  Narrative Interpretation: unremarkable  Comparison with previous EKG: unchanged  Potassium from the lab earlier today was noted to be 6.2. Given that it is 3.7 at this time the elevated value likely represents hemolysis.             Hanley Seamen, MD 05/23/12 [phone]

## 2012-05-23 NOTE — ED Notes (Signed)
She is scheduled to have a heart cath in 4 days for chest pain x 8 months. She got a call from her MD tonight and was told to come to the ED for a high Potassium level. She was not told how high the report showed.

## 2012-05-26 ENCOUNTER — Other Ambulatory Visit: Payer: Self-pay | Admitting: Family

## 2012-05-27 ENCOUNTER — Encounter (HOSPITAL_BASED_OUTPATIENT_CLINIC_OR_DEPARTMENT_OTHER)
Admission: RE | Disposition: A | Discharge status: Home / Self Care | Payer: Self-pay | Source: Ambulatory Visit | Attending: Cardiovascular Disease

## 2012-05-27 ENCOUNTER — Encounter (HOSPITAL_BASED_OUTPATIENT_CLINIC_OR_DEPARTMENT_OTHER): Payer: Self-pay

## 2012-05-27 ENCOUNTER — Inpatient Hospital Stay (HOSPITAL_BASED_OUTPATIENT_CLINIC_OR_DEPARTMENT_OTHER)
Admission: RE | Admit: 2012-05-27 | Discharge: 2012-05-27 | Disposition: A | Discharge status: Home / Self Care | Payer: BC Managed Care – PPO | Source: Ambulatory Visit | Attending: Cardiovascular Disease | Admitting: Cardiovascular Disease

## 2012-05-27 DIAGNOSIS — I10 Essential (primary) hypertension: Secondary | ICD-10-CM | POA: Insufficient documentation

## 2012-05-27 DIAGNOSIS — R079 Chest pain, unspecified: Secondary | ICD-10-CM

## 2012-05-27 LAB — POCT I-STAT 3, ART BLOOD GAS (G3+)
Bicarbonate: 25.1 mEq/L — ABNORMAL HIGH (ref 20.0–24.0)
TCO2: 26 mmol/L (ref 0–100)
pH, Arterial: 7.379 (ref 7.350–7.450)
pO2, Arterial: 79 mmHg — ABNORMAL LOW (ref 80.0–100.0)

## 2012-05-27 SURGERY — JV LEFT AND RIGHT HEART CATHETERIZATION WITH CORONARY ANGIOGRAM
Anesthesia: Moderate Sedation

## 2012-05-27 MED ORDER — ACETAMINOPHEN 325 MG PO TABS: 650.0000 mg | ORAL_TABLET | ORAL | Status: DC | PRN

## 2012-05-27 MED ORDER — ONDANSETRON HCL 4 MG/2ML IJ SOLN: 4.0000 mg | Freq: Four times a day (QID) | INTRAMUSCULAR | Status: DC | PRN

## 2012-05-27 MED ORDER — ASPIRIN 81 MG PO CHEW
324.0000 mg | CHEWABLE_TABLET | ORAL | Status: AC
  Administered 2012-05-27: 324 mg via ORAL

## 2012-05-27 MED ORDER — SODIUM CHLORIDE 0.9 % IV SOLN: INTRAVENOUS | Status: DC

## 2012-05-27 MED ORDER — SODIUM CHLORIDE 0.9 % IV SOLN: 250.0000 mL | INTRAVENOUS | Status: DC | PRN

## 2012-05-27 MED ORDER — SODIUM CHLORIDE 0.9 % IJ SOLN: 3.0000 mL | INTRAMUSCULAR | Status: DC | PRN

## 2012-05-27 MED ORDER — DIAZEPAM 5 MG PO TABS
5.0000 mg | ORAL_TABLET | ORAL | Status: AC
  Administered 2012-05-27: 5 mg via ORAL

## 2012-05-27 MED ORDER — SODIUM CHLORIDE 0.9 % IV SOLN: 1.0000 mL/kg/h | INTRAVENOUS | Status: DC

## 2012-05-27 MED ORDER — SODIUM CHLORIDE 0.9 % IJ SOLN: 3.0000 mL | Freq: Two times a day (BID) | INTRAMUSCULAR | Status: DC

## 2012-05-27 NOTE — OR Nursing (Signed)
Discharge instructions reviewed and signed, pt stated understanding, ambulated in hall without difficulty, site level 0, transported to husband's car via wheelchair 

## 2012-05-27 NOTE — Telephone Encounter (Signed)
Done/SLS 

## 2012-05-27 NOTE — CV Procedure (Signed)
   Cardiac Catheterization Procedure Note  Name: Michele [name] MRN: [id number] DOB: 11-Feb-1971  Procedure: Left Heart Cath, Selective Coronary Angiography, LV angiography  Indication: Chest pain, shortness of breath.  Procedural details: The right groin was prepped, draped, and anesthetized with 1% lidocaine. Using modified Seldinger technique, a 4 French sheath was introduced into the right femoral artery. I attempted to access the RFV for a right heart catheterization but after multiple attempts I was unsuccessful. I felt the yield of RHC was low and decided to proceed with LHC only. Standard Judkins catheters were used for coronary angiography and left ventriculography. Catheter exchanges were performed over a guidewire. There were no immediate procedural complications. The patient was transferred to the post catheterization recovery area for further monitoring.  Procedural Findings: Hemodynamics:  AO 117/72 LV 121/13   Coronary angiography: Coronary dominance: right  Left mainstem: Angiographically normal  Left anterior descending (LAD): Widely patent to the LV apex, small diagonals without significant disease.  Left circumflex (LCx): Angiographically normal. Small OM1 and large OM2 are both widely patent.  Right coronary artery (RCA): Moderate caliber, dominant vessel. Supplies a moderate sized PDA and PLA  Left ventriculography: Left ventricular systolic function is normal, LVEF is estimated at 55-65%, there is no significant mitral regurgitation   Final Conclusions:   1. Angiographically normal coronary arteries 2. Normal LV systolic function 3. Normal intracardiac hemodynamics.  Recommendations: Reassurance. No cardiac disease noted.  Tonny Bollman 05/27/2012, 9:57 AM

## 2012-05-27 NOTE — Interval H&P Note (Signed)
History and Physical Interval Note:  05/27/2012 9:17 AM  Michele [name]  has presented today for surgery, with the diagnosis of SOB  The various methods of treatment have been discussed with the patient and family. After consideration of risks, benefits and other options for treatment, the patient has consented to  Procedure(s) (LRB) with comments: JV LEFT AND RIGHT HEART CATHETERIZATION WITH CORONARY ANGIOGRAM (N/A) as a surgical intervention .  The patient's history has been reviewed, patient examined, no change in status, stable for surgery.  I have reviewed the patient's chart and labs.  Questions were answered to the patient's satisfaction.     Tonny Bollman

## 2012-05-27 NOTE — OR Nursing (Signed)
Tegaderm dressing applied, site level 0, bedrest begins at 1010 

## 2012-05-27 NOTE — H&P (View-Only) (Signed)
 HPI:  41-year-old woman presenting for followup evaluation. She continues to have problems with chest pain. 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 She has really become debilitated with her chest pain. She can't do any physical activity without chest discomfort, fatigue, palpitations, or shortness of breath. She's had an event recorder done last year that showed no significant arrhythmia. Chest pain symptoms occur with emotional and physical stress. She feels a pressure across her anterior chest. She has associated symptoms of diaphoresis, shortness of breath, and lightheadedness. She has not had frank syncope. She also complains of abdominal and lower extremity edema. She had an emergency room visit earlier this year for chest pain and had a CT scan of the chest performed. This demonstrated no evidence of pulmonary emboli. The patient is iron deficient and she is undergoing Feraheme infusions. Dr Ennever scheduled her for a CT scan of the chest, abdomen, and pelvis. The patient's blood pressure has been labile. She has established at the nephrology clinic at [facility] and medication adjustments were made after her first visit here. I started her on labetalol and I saw her last, but this is been discontinued. She has been started on Aldactone and she continues on losartan and chlorthalidone.  Outpatient Encounter Prescriptions as of 05/14/2012  Medication Sig Dispense Refill  . acetaminophen (TYLENOL) 500 MG tablet Take 1,000 mg by mouth every 6 (six) hours as needed. For headache      . ALPRAZolam (XANAX) 1 MG tablet Take 1 mg by mouth 3 (three) times daily as needed. For anxiety      . aspirin (GNP ASPIRIN) 81 MG EC tablet Take 81 mg by mouth daily. Swallow whole.      . BIOTIN PO Take 100 mg by mouth every morning.      . chlorthalidone (HYGROTON) 25 MG tablet Take 25 mg by mouth daily.      .  FLUoxetine (PROZAC) 20 MG capsule Take 20 mg by mouth daily.      . furosemide (LASIX) 20 MG tablet Take 20 mg by mouth every other day.      . levothyroxine (LEVOTHROID) 25 MCG tablet Take 25 mcg by mouth daily.      . losartan (COZAAR) 100 MG tablet Take 1 tablet (100 mg total) by mouth daily.  30 tablet  6  . Multiple Vitamin (MULTI-VITAMIN PO) Take by mouth daily.      . spironolactone (ALDACTONE) 25 MG tablet Take 25 mg by mouth daily.      . sucralfate (CARAFATE) 1 G tablet Take 1 g by mouth 4 (four) times daily.      . SUMAtriptan (IMITREX) 50 MG tablet as needed. Take 1 tab at start of headache, may repeat once 2 hours later if your symptoms return or do not improve.        Allergies  Allergen Reactions  . Amlodipine Shortness Of Breath and Palpitations  . Wellbutrin (Bupropion Hcl) Other (See Comments)    Dizziness, increased anxiety    Past Medical History  Diagnosis Date  . Depression   . Hypertension   . Anemia     iron deficient, microcytic, hypochromic  . Anxiety   . Migraines   . 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 Fibroids     uterine  . Palpitations     recurrent  . Fatigue   . Tachycardia     unspecified  .   Chest pain     atypical  . 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 Patel at [facility]- S/p FNA March/april of two dominant nodules- benign.  Following for annual thyroid us with Dr. Patel.      ROS: Negative except as per HPI  BP 130/96  Pulse 80  Ht 5' 4.5" (1.638 m)  Wt 84.369 kg (186 lb)  BMI 31.43 kg/m2  PHYSICAL EXAM: Pt is alert and oriented, pleasant young woman in NAD HEENT: normal Neck: JVP - normal, carotids 2+= without bruits Lungs: CTA bilaterally CV: RRR without murmur or gallop Abd: soft, NT, Positive BS, no hepatomegaly Ext: trace lower extremity edema bilaterally, distal pulses intact and equal Skin: warm/dry no rash  EKG:  Normal sinus rhythm 80 beats per minute, within normal limits  ASSESSMENT  AND PLAN:  

## 2012-05-27 NOTE — OR Nursing (Signed)
Dr Cooper at bedside to discuss results and treatment plan with pt and family 

## 2012-05-28 ENCOUNTER — Encounter (HOSPITAL_BASED_OUTPATIENT_CLINIC_OR_DEPARTMENT_OTHER): Payer: Self-pay

## 2012-05-31 ENCOUNTER — Telehealth: Payer: Self-pay | Admitting: Cardiovascular Disease

## 2012-05-31 NOTE — Telephone Encounter (Signed)
**Note De-Identified Robyne Matar Obfuscation** Pt. states that the pain at site has improved since Monday but is concerned about bruising. Pt. advised that bruising is normal after cath but to call us back if site becomes more painful, swollen, hot to the tough or any drainage. She verbalized understanding./LV

## 2012-05-31 NOTE — Telephone Encounter (Signed)
Pt having a lot of bruising since cath still sore on Monday, pls call [phone]

## 2012-06-02 ENCOUNTER — Emergency Department (HOSPITAL_BASED_OUTPATIENT_CLINIC_OR_DEPARTMENT_OTHER)
Admission: EM | Admit: 2012-06-02 | Discharge: 2012-06-02 | Disposition: A | Discharge status: Home / Self Care | Payer: BC Managed Care – PPO | Attending: Emergency Medicine | Admitting: Emergency Medicine

## 2012-06-02 ENCOUNTER — Encounter (HOSPITAL_BASED_OUTPATIENT_CLINIC_OR_DEPARTMENT_OTHER): Payer: Self-pay | Admitting: *Deleted

## 2012-06-02 DIAGNOSIS — I10 Essential (primary) hypertension: Secondary | ICD-10-CM | POA: Insufficient documentation

## 2012-06-02 DIAGNOSIS — S7010XA Contusion of unspecified thigh, initial encounter: Secondary | ICD-10-CM | POA: Insufficient documentation

## 2012-06-02 DIAGNOSIS — Y840 Cardiac catheterization as the cause of abnormal reaction of the patient, or of later complication, without mention of misadventure at the time of the procedure: Secondary | ICD-10-CM | POA: Insufficient documentation

## 2012-06-02 DIAGNOSIS — S301XXA Contusion of abdominal wall, initial encounter: Secondary | ICD-10-CM

## 2012-06-02 DIAGNOSIS — D649 Anemia, unspecified: Secondary | ICD-10-CM | POA: Insufficient documentation

## 2012-06-02 DIAGNOSIS — F411 Generalized anxiety disorder: Secondary | ICD-10-CM | POA: Insufficient documentation

## 2012-06-02 MED ORDER — HYDROCODONE-ACETAMINOPHEN 5-325 MG PO TABS: 1.0000 | ORAL_TABLET | ORAL | Status: DC | PRN

## 2012-06-02 NOTE — ED Provider Notes (Addendum)
History   This chart was scribed for [name], MD by Sofie Rower. The patient was seen in room MH01/MH01 and the patient's care was started at 5:06PM    CSN: [id number]  Arrival date & time 06/02/12  1456   First MD Initiated Contact with Patient 06/02/12 1706      Chief Complaint  Patient presents with  . Groin Pain    (Consider location/radiation/quality/duration/timing/severity/associated sxs/prior treatment) Patient is a 41 y.o. female presenting with groin pain. The history is provided by the patient. No language interpreter was used.  Groin Pain This is a new problem. The current episode started more than 2 days ago. The problem occurs constantly. The problem has been gradually worsening. Pertinent negatives include no chest pain, no abdominal pain, no headaches and no shortness of breath. The symptoms are aggravated by exertion. Nothing relieves the symptoms. She has tried acetaminophen for the symptoms. The treatment provided no relief.    Michele [name] is a 41 y.o. female , who presents to the Emergency Department complaining of sudden, progressively worsening, groin pain located at the right inguinal crease onset three days ago with associated symptoms of ecchymosis of the skin located at the right inguinal crease. The pt reports she has been experiencing a knotting pain sensation located at her groin since Thursday, 05/30/12. Modifying factors include taking tylenol which provides moderate pain relief. The pt has a hx of hypertension, cardiac cathization, and tubal ligation.   The pt denies allergies to any medications and anystent placement.     The pt does not smoke, however, she does drink alcohol on occasion.    PCP is Dr. Peggyann Juba.    Past Medical History  Diagnosis Date  . Depression   . Hypertension   . Anemia     iron deficient, microcytic, hypochromic  . Anxiety   . Migraines   . 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 Fibroids     uterine   . Palpitations     recurrent  . Fatigue   . Tachycardia     unspecified  . Chest pain     atypical  . 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 Allena Katz at [facility]- S/p FNA March/april of two dominant nodules- benign.  Following for annual thyroid US with Dr. Allena Katz.      Past Surgical History  Procedure Date  . Appendectomy 03/2009  . Tubal ligation 1997  . Tuboplasty / tubotubal anastomosis 2005  . Biopsy thyroid November 28, 2009    Dr. Allena Katz- endo  . Dilation and curettage of uterus 05/21/2011    Procedure: DILATATION AND CURETTAGE (D&C);  Surgeon: Robley Fries;  Location: [facility];  Service: Gynecology;  Laterality: N/A;  dilitation and currettage/endometrial currettings    Family History  Problem Relation Age of Onset  . Hypertension Mother   . Cancer Mother 59    breast  . Cancer Father     colon  . Stroke Brother     handicapped due to complications from spinal meningitis  . Seizures Brother   . Asthma Son   . Arthritis Maternal Grandmother   . Hypertension Maternal Grandmother   . Stroke Maternal Grandfather     History  Substance Use Topics  . Smoking status: Never Smoker   . Smokeless tobacco: Never Used  . Alcohol Use: Yes     Once a month    OB History    Grav Para Term Preterm  Abortions TAB SAB Ect Mult Living   4 2 1 1 2 1  1  2       Review of Systems  Respiratory: Negative for shortness of breath.   Cardiovascular: Negative for chest pain.  Gastrointestinal: Negative for abdominal pain.  Neurological: Negative for headaches.  All other systems reviewed and are negative.    Allergies  Amlodipine and Wellbutrin  Home Medications   Current Outpatient Rx  Name Route Sig Dispense Refill  . ACETAMINOPHEN 500 MG PO TABS Oral Take 1,000-1,500 mg by mouth every 6 (six) hours as needed. For headache    . ALPRAZOLAM 1 MG PO TABS Oral Take 0.5-1 mg by mouth 3 (three) times daily as needed. For anxiety    . ASPIRIN 81 MG PO TBEC  Oral Take 81 mg by mouth daily.     Marland Kitchen BIOTIN PO Oral Take 1 tablet by mouth daily.     Marland Kitchen FLUOXETINE HCL 20 MG PO CAPS Oral Take 20 mg by mouth daily.    . FUROSEMIDE 20 MG PO TABS Oral Take 20 mg by mouth every other day.    . FUROSEMIDE 20 MG PO TABS  TAKE ONE TABLET BY MOUTH EVERY DAY 30 tablet 2  . LEVOTHYROXINE SODIUM 25 MCG PO TABS Oral Take 25 mcg by mouth daily.    Marland Kitchen LOSARTAN POTASSIUM 100 MG PO TABS Oral Take 1 tablet (100 mg total) by mouth daily. 30 tablet 6  . MULTI-VITAMIN PO Oral Take 1 tablet by mouth daily.     Marland Kitchen SPIRONOLACTONE 25 MG PO TABS Oral Take 25 mg by mouth daily.    . SUCRALFATE 1 G PO TABS Oral Take 1 g by mouth 4 (four) times daily as needed. For indigestion      BP 132/94  Pulse 92  Temp 97.9 F (36.6 C) (Oral)  Resp 20  Ht 5\' 5"  (1.651 m)  Wt 180 lb (81.647 kg)  BMI 29.95 kg/m2  SpO2 98%  LMP 05/17/2012  Physical Exam  Nursing note and vitals reviewed. Constitutional: She is oriented to person, place, and time. She appears well-developed and well-nourished.  HENT:  Head: Atraumatic.  Nose: Nose normal.  Eyes: Conjunctivae normal and EOM are normal.  Neck: Normal range of motion. Neck supple.  Cardiovascular: Normal rate.   Pulmonary/Chest: Effort normal. No respiratory distress.  Genitourinary:       2-3 CM X 1 CM hematoma detected at right inguinal crease, tenderness, non infected.   Musculoskeletal: Normal range of motion.  Neurological: She is alert and oriented to person, place, and time.  Skin: Skin is warm and dry.  Psychiatric: She has a normal mood and affect. Her behavior is normal.    ED Course  Procedures (including critical care time)  DIAGNOSTIC STUDIES: Oxygen Saturation is 98% on room air, normal by my interpretation.    COORDINATION OF CARE:    5:10PM- Pain management, management of swelling, and follow up with [facility] (Dr. Excell Seltzer). Pt agrees to treatment.       1. Hematoma of groin     I personally  performed the services described in this documentation, which was scribed in my presence. The recorded information has been reviewed and considered.  Osvaldo Human, M.D.   [name], MD 06/02/12 1715  [name], MD 06/03/12 [phone]

## 2012-06-02 NOTE — ED Notes (Signed)
Pt states she had a heart cath Monday. Noticed knot and bruising Thurs. Called office. Told to mark site and come to ED if increased pain.

## 2012-06-03 ENCOUNTER — Telehealth: Payer: Self-pay | Admitting: Cardiovascular Disease

## 2012-06-03 ENCOUNTER — Encounter (INDEPENDENT_AMBULATORY_CARE_PROVIDER_SITE_OTHER): Payer: BC Managed Care – PPO

## 2012-06-03 DIAGNOSIS — R1030 Lower abdominal pain, unspecified: Secondary | ICD-10-CM

## 2012-06-03 DIAGNOSIS — T148XXA Other injury of unspecified body region, initial encounter: Secondary | ICD-10-CM

## 2012-06-03 DIAGNOSIS — M79609 Pain in unspecified limb: Secondary | ICD-10-CM

## 2012-06-03 DIAGNOSIS — R229 Localized swelling, mass and lump, unspecified: Secondary | ICD-10-CM

## 2012-06-03 NOTE — Telephone Encounter (Signed)
I spoke with the pt and she is still having pain in her right groin.  The pt did not have an ultrasound of her groin while in the ER.  I have scheduled the pt to come into the office today at 12:30 for ultrasound of groin. Pt agreed with plan.

## 2012-06-03 NOTE — Telephone Encounter (Signed)
New Problem:    Patient called in wanting to speak with you about complications she was having after having her cath done last week.  Patient went to the ER last night because of these complications.  Patient is experiencing a lot of pain in her right leg and has a large knot at her Cath site.  Please call back.

## 2012-06-10 ENCOUNTER — Encounter: Payer: Self-pay | Admitting: Family

## 2012-06-10 ENCOUNTER — Ambulatory Visit (INDEPENDENT_AMBULATORY_CARE_PROVIDER_SITE_OTHER): Payer: BC Managed Care – PPO | Admitting: Family

## 2012-06-10 VITALS — BP 116/86 | HR 89 | Temp 98.9°F | Resp 16 | Ht 65.0 in | Wt 189.0 lb

## 2012-06-10 DIAGNOSIS — J069 Acute upper respiratory infection, unspecified: Secondary | ICD-10-CM

## 2012-06-10 MED ORDER — FLUTICASONE PROPIONATE 50 MCG/ACT NA SUSP: 2.0000 | Freq: Every day | NASAL | Status: DC

## 2012-06-10 NOTE — Patient Instructions (Addendum)
You may use claritin 10mg  once daily to help with congestion, flonase.  Nasal saline wash or neti pot.   Call if you develop fever over 101, or if your symptoms are not improved in 3 days.

## 2012-06-10 NOTE — Assessment & Plan Note (Signed)
Symptoms consistent with URI. Recommended that pt use neti pot or nasal saline spray.  Call if fever >101 or if not improved in 3 days. Add flonase and claritin.  Pt is to avoid sudafed products due to hx of htn.

## 2012-06-10 NOTE — Progress Notes (Signed)
Subjective:    Patient ID: FANTASY DONALD, female    DOB: [birth year], 41 y.o.   MRN: [id number]  HPI  Pt presents today with 1 day hx of nasal congestion, muscle aching, sore throat, ear pain and pressure.  She reports clear nasal drainage.  Has not tried any otc meds.  Repeat BP today in office is 136/84.   Review of Systems See HPI  Past Medical History  Diagnosis Date  . Depression   . Hypertension   . Anemia     iron deficient, microcytic, hypochromic  . Anxiety   . Migraines   . 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 Fibroids     uterine  . Palpitations     recurrent  . Fatigue   . Tachycardia     unspecified  . Chest pain     atypical  . 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 Allena Katz at [facility]- S/p FNA March/april of two dominant nodules- benign.  Following for annual thyroid US with Dr. Allena Katz.      History   Social History  . Marital Status: Married    Spouse Name: [name]    Number of Children: 2  . Years of Education: N/A   Occupational History  . 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 Smoking status: Never Smoker   . Smokeless tobacco: Never Used  . Alcohol Use: Yes     Once a month  . Drug Use: No  . Sexually Active: Yes    Birth Control/ Protection: None   Other Topics Concern  . Not on file   Social History Narrative   Regular exercise: yesDaily caffeine: 1-2 daily    Past Surgical History  Procedure Date  . Appendectomy 03/2009  . Tubal ligation 1997  . Tuboplasty / tubotubal anastomosis 2005  . Biopsy thyroid November 28, 2009    Dr. Allena Katz- endo  . Dilation and curettage of uterus 05/21/2011    Procedure: DILATATION AND CURETTAGE (D&C);  Surgeon: Robley Fries;  Location: [facility];  Service: Gynecology;  Laterality: N/A;  dilitation and currettage/endometrial currettings    Family History  Problem Relation Age of Onset  . Hypertension Mother   . Cancer Mother 52    breast  . Cancer  Father     colon  . Stroke Brother     handicapped due to complications from spinal meningitis  . Seizures Brother   . Asthma Son   . Arthritis Maternal Grandmother   . Hypertension Maternal Grandmother   . Stroke Maternal Grandfather     Allergies  Allergen Reactions  . Amlodipine Shortness Of Breath and Palpitations  . Wellbutrin (Bupropion Hcl) Other (See Comments)    Dizziness, increased anxiety    Current Outpatient Prescriptions on File Prior to Visit  Medication Sig Dispense Refill  . acetaminophen (TYLENOL) 500 MG tablet Take 1,000-1,500 mg by mouth every 6 (six) hours as needed. For headache      . ALPRAZolam (XANAX) 1 MG tablet Take 1 mg by mouth 4 (four) times daily as needed. For anxiety      . aspirin (GNP ASPIRIN) 81 MG EC tablet Take 81 mg by mouth daily.       Marland Kitchen BIOTIN PO Take 1 tablet by mouth daily.       Marland Kitchen FLUoxetine (PROZAC) 20 MG capsule Take 20 mg by mouth daily.      . furosemide (LASIX) 20  MG tablet Take 20 mg by mouth every other day.      . levothyroxine (LEVOTHROID) 25 MCG tablet Take 25 mcg by mouth daily.      Marland Kitchen losartan (COZAAR) 100 MG tablet Take 1 tablet (100 mg total) by mouth daily.  30 tablet  6  . Multiple Vitamin (MULTI-VITAMIN PO) Take 1 tablet by mouth daily.       Marland Kitchen spironolactone (ALDACTONE) 25 MG tablet Take 25 mg by mouth daily.      . sucralfate (CARAFATE) 1 G tablet Take 1 g by mouth 4 (four) times daily as needed. For indigestion      . fluticasone (FLONASE) 50 MCG/ACT nasal spray Place 2 sprays into the nose daily.  16 g  0  . HYDROcodone-acetaminophen (NORCO/VICODIN) 5-325 MG per tablet Take 1 tablet by mouth every 4 (four) hours as needed for pain.  20 tablet  0  . DISCONTD: buPROPion (WELLBUTRIN XL) 150 MG 24 hr tablet Take 2 tablets (300 mg total) by mouth daily.  60 tablet  1    BP 116/86  Pulse 89  Temp 98.9 F (37.2 C) (Oral)  Resp 16  Ht 5\' 5"  (1.651 m)  Wt 189 lb (85.73 kg)  BMI 31.45 kg/m2  SpO2 99%  LMP  05/17/2012       Objective:   Physical Exam  Constitutional: She appears well-developed and well-nourished. No distress.  HENT:  Head: Normocephalic and atraumatic.  Right Ear: Tympanic membrane and ear canal normal.  Left Ear: Tympanic membrane and ear canal normal.  Mouth/Throat: No oropharyngeal exudate, posterior oropharyngeal edema or posterior oropharyngeal erythema.  Cardiovascular: Normal rate and regular rhythm.   No murmur heard. Pulmonary/Chest: Effort normal and breath sounds normal. No respiratory distress. She has no wheezes. She has no rales. She exhibits no tenderness.  Musculoskeletal: She exhibits no edema.  Lymphadenopathy:    She has no cervical adenopathy.  Psychiatric: She has a normal mood and affect. Her behavior is normal. Judgment and thought content normal.          Assessment & Plan:

## 2012-06-13 ENCOUNTER — Ambulatory Visit (HOSPITAL_BASED_OUTPATIENT_CLINIC_OR_DEPARTMENT_OTHER): Payer: BC Managed Care – PPO | Admitting: Hematology & Oncology

## 2012-06-13 ENCOUNTER — Other Ambulatory Visit (HOSPITAL_BASED_OUTPATIENT_CLINIC_OR_DEPARTMENT_OTHER): Payer: BC Managed Care – PPO | Admitting: Lab

## 2012-06-13 VITALS — BP 125/83 | HR 94 | Temp 97.3°F | Resp 18 | Ht 65.0 in | Wt 186.0 lb

## 2012-06-13 DIAGNOSIS — D509 Iron deficiency anemia, unspecified: Secondary | ICD-10-CM

## 2012-06-13 DIAGNOSIS — R079 Chest pain, unspecified: Secondary | ICD-10-CM

## 2012-06-13 DIAGNOSIS — J329 Chronic sinusitis, unspecified: Secondary | ICD-10-CM

## 2012-06-13 DIAGNOSIS — D508 Other iron deficiency anemias: Secondary | ICD-10-CM

## 2012-06-13 DIAGNOSIS — R0789 Other chest pain: Secondary | ICD-10-CM

## 2012-06-13 LAB — CBC WITH DIFFERENTIAL (CANCER CENTER ONLY)
Eosinophils Absolute: 0.1 10*3/uL (ref 0.0–0.5)
HCT: 39.9 % (ref 34.8–46.6)
HGB: 14.2 g/dL (ref 11.6–15.9)
LYMPH#: 1.9 10*3/uL (ref 0.9–3.3)
LYMPH%: 27.4 % (ref 14.0–48.0)
MCV: 87 fL (ref 81–101)
MONO#: 0.6 10*3/uL (ref 0.1–0.9)
NEUT%: 62.2 % (ref 39.6–80.0)
RBC: 4.6 10*6/uL (ref 3.70–5.32)
WBC: 6.8 10*3/uL (ref 3.9–10.0)

## 2012-06-13 MED ORDER — PREDNISONE 20 MG PO TABS: ORAL_TABLET | ORAL | Status: DC

## 2012-06-13 MED ORDER — AMOXICILLIN-POT CLAVULANATE 875-125 MG PO TABS: 1.0000 | ORAL_TABLET | Freq: Two times a day (BID) | ORAL | Status: DC

## 2012-06-13 NOTE — Progress Notes (Signed)
This office note has been dictated.

## 2012-06-14 LAB — RETICULOCYTES (CHCC)
ABS Retic: 75.5 10*3/uL (ref 19.0–186.0)
RBC.: 4.72 MIL/uL (ref 3.87–5.11)
Retic Ct Pct: 1.6 % (ref 0.4–2.3)

## 2012-06-14 LAB — IRON AND TIBC
%SAT: 33 % (ref 20–55)
Iron: 89 ug/dL (ref 42–145)
TIBC: 270 ug/dL (ref 250–470)
UIBC: 181 ug/dL (ref 125–400)

## 2012-06-14 LAB — SEDIMENTATION RATE: Sed Rate: 4 mm/hr (ref 0–22)

## 2012-06-14 NOTE — Progress Notes (Signed)
DIAGNOSES: 1. Recurrent iron deficiency anemia. 2. Chest wall pain, possible costochondritis.  CURRENT THERAPY:  Iron IV iron as indicated.  INTERIM HISTORY:  [name] comes in for a followup.  She apparently had a cardiac cath, I guess, this summer.  She had chest pain.  The cardiac cath was negative for any coronary artery disease.  She complains of chest wall pain.  She points exactly to the costochondral junction where costochondritis tends to reside.  This might be the issue.  She has a sinus problem now.  She is very congested.  She is bringing up some purulent secretions from her nose.  We last gave her iron back in late August.  She has not had any menstrual cycles so this has not been an issue for [name].  PHYSICAL EXAMINATION:  This is a well-developed, well-nourished white female in no obvious distress.  Vital signs:  Temperature of 97.3, pulse 94, respiratory rate 18, blood pressure 125/83.  Weight is 186.  Head and neck:  Normocephalic, atraumatic skull.  There are no ocular or oral lesions.  There are no palpable cervical or supraclavicular lymph nodes. Lungs:  Clear bilaterally.  There are no rales, wheezes or rhonchi. Cardiac:  Regular rate and rhythm with a normal S1, S2.  There are no murmurs, rubs or bruits.  Abdomen:  Soft with good bowel sounds.  There is no palpable abdominal mass.  There is no fluid wave.  There is no palpable hepatosplenomegaly.  Extremities:  No clubbing, cyanosis or edema.  Chest wall exam does show some tenderness about the 4th costochondral junction.  There may be some slight swelling in this area. Neurological:  No focal neurological deficits.  LABORATORY STUDIES:  White cell count is 6.8, hemoglobin 14.2, hematocrit 39.9, platelet count is 223.  Ferritin is 437.  Iron saturation is 33%.  IMPRESSION:  [name] is a 41 year old African American female with iron deficiency anemia.  This is not a problem for her  right now.  Her iron studies look great.  I will give her some Augmentin for 7 days for this likely sinusitis.  I am going to put her on prednisone taper for about 10 days to see if this does not help with this costochondritis.  I think that her pain is from the costochondral joint.  We will plan to get her back in about a month or so just to follow up with her.    ______________________________ Josph Macho, M.D. PRE/MEDQ  D:  06/13/2012  T:  06/14/2012  Job:  [id number]

## 2012-06-20 ENCOUNTER — Encounter: Payer: Self-pay | Admitting: *Deleted

## 2012-06-20 NOTE — Progress Notes (Signed)
Patient called asking about the way her Prednisone should be taken.  On the bottle it just says "Take as directed"  Asked Dr. Lupita Leash.  Patient to take 40 mg a day x 4 days, 20 mg /d x 5 days and 10 mg /day x 5 days.

## 2012-06-21 ENCOUNTER — Telehealth: Payer: Self-pay | Admitting: Family

## 2012-06-21 ENCOUNTER — Ambulatory Visit (HOSPITAL_BASED_OUTPATIENT_CLINIC_OR_DEPARTMENT_OTHER)
Admission: RE | Admit: 2012-06-21 | Discharge: 2012-06-21 | Disposition: A | Discharge status: Home / Self Care | Payer: BC Managed Care – PPO | Source: Ambulatory Visit | Attending: Family | Admitting: Family

## 2012-06-21 ENCOUNTER — Encounter: Payer: Self-pay | Admitting: Family

## 2012-06-21 ENCOUNTER — Ambulatory Visit (INDEPENDENT_AMBULATORY_CARE_PROVIDER_SITE_OTHER): Payer: BC Managed Care – PPO | Admitting: Family

## 2012-06-21 ENCOUNTER — Encounter: Payer: BC Managed Care – PPO | Admitting: Cardiovascular Disease

## 2012-06-21 VITALS — BP 124/88 | HR 95 | Temp 98.1°F | Resp 16 | Wt 191.0 lb

## 2012-06-21 DIAGNOSIS — R071 Chest pain on breathing: Secondary | ICD-10-CM | POA: Insufficient documentation

## 2012-06-21 DIAGNOSIS — Z23 Encounter for immunization: Secondary | ICD-10-CM

## 2012-06-21 DIAGNOSIS — R0781 Pleurodynia: Secondary | ICD-10-CM

## 2012-06-21 DIAGNOSIS — R300 Dysuria: Secondary | ICD-10-CM

## 2012-06-21 LAB — POCT URINALYSIS DIPSTICK
Bilirubin, UA: NEGATIVE
Glucose, UA: NEGATIVE
Ketones, UA: NEGATIVE
Leukocytes, UA: NEGATIVE
Nitrite, UA: NEGATIVE
Spec Grav, UA: 1.01
Urobilinogen, UA: 0.2
pH, UA: 7

## 2012-06-21 MED ORDER — NAPROXEN 500 MG PO TABS: 500.0000 mg | ORAL_TABLET | Freq: Two times a day (BID) | ORAL | Status: DC

## 2012-06-21 NOTE — Telephone Encounter (Signed)
Attempted to reach pt and leave message re: normal CXR.  No answer, mail box full.  Could you pls call pt on Monday and notify her of neg cxr? Thanks.

## 2012-06-21 NOTE — Addendum Note (Signed)
Addended by: Mervin Kung A on: 06/21/2012 05:26 PM   Modules accepted: Orders

## 2012-06-21 NOTE — Progress Notes (Signed)
Subjective:    Patient ID: Michele [name], female    DOB: 1971/06/20, 41 y.o.   MRN: [id number]  HPI  [name] is a 41 year old female who presents today with 2 concerns.  1) R sided rib pain- worse with deep breath x 3 days. She is completing abx and steroids from Dr. Myna Hidalgo.    2) Dysuria- symptoms started about 1 week ago.  Mild dysuria and + frequency.    Review of Systems See HPI  Past Medical History  Diagnosis Date  . Depression   . Hypertension   . Anemia     iron deficient, microcytic, hypochromic  . Anxiety   . Migraines   . 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 Fibroids     uterine  . Palpitations     recurrent  . Fatigue   . Tachycardia     unspecified  . Chest pain     atypical  . 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 Allena Katz at [facility]- S/p FNA March/april of two dominant nodules- benign.  Following for annual thyroid US with Dr. Allena Katz.      History   Social History  . Marital Status: Married    Spouse Name: [name]    Number of Children: 2  . Years of Education: N/A   Occupational History  . 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 Smoking status: Never Smoker   . Smokeless tobacco: Never Used  . Alcohol Use: Yes     Once a month  . Drug Use: No  . Sexually Active: Yes    Birth Control/ Protection: None   Other Topics Concern  . Not on file   Social History Narrative   Regular exercise: yesDaily caffeine: 1-2 daily    Past Surgical History  Procedure Date  . Appendectomy 03/2009  . Tubal ligation 1997  . Tuboplasty / tubotubal anastomosis 2005  . Biopsy thyroid November 28, 2009    Dr. Allena Katz- endo  . Dilation and curettage of uterus 05/21/2011    Procedure: DILATATION AND CURETTAGE (D&C);  Surgeon: Robley Fries;  Location: [facility];  Service: Gynecology;  Laterality: N/A;  dilitation and currettage/endometrial currettings    Family History  Problem Relation Age of Onset    . Hypertension Mother   . Cancer Mother 8    breast  . Cancer Father     colon  . Stroke Brother     handicapped due to complications from spinal meningitis  . Seizures Brother   . Asthma Son   . Arthritis Maternal Grandmother   . Hypertension Maternal Grandmother   . Stroke Maternal Grandfather     Allergies  Allergen Reactions  . Amlodipine Shortness Of Breath and Palpitations  . Wellbutrin (Bupropion Hcl) Other (See Comments)    Dizziness, increased anxiety    Current Outpatient Prescriptions on File Prior to Visit  Medication Sig Dispense Refill  . acetaminophen (TYLENOL) 500 MG tablet Take 1,000-1,500 mg by mouth every 6 (six) hours as needed. For headache      . ALPRAZolam (XANAX) 1 MG tablet Take 1 mg by mouth 4 (four) times daily as needed. For anxiety      . amoxicillin-clavulanate (AUGMENTIN) 875-125 MG per tablet Take 1 tablet by mouth 2 (two) times daily.  14 tablet  0  . aspirin (GNP ASPIRIN) 81 MG EC tablet Take 81 mg by mouth daily.       Marland Kitchen  BIOTIN PO Take 1 tablet by mouth daily.       Marland Kitchen FLUoxetine (PROZAC) 20 MG capsule Take 20 mg by mouth daily.      . fluticasone (FLONASE) 50 MCG/ACT nasal spray Place 2 sprays into the nose daily.  16 g  0  . furosemide (LASIX) 20 MG tablet Take 20 mg by mouth every other day.      . levothyroxine (LEVOTHROID) 25 MCG tablet Take 25 mcg by mouth daily.      Marland Kitchen losartan (COZAAR) 100 MG tablet Take 1 tablet (100 mg total) by mouth daily.  30 tablet  6  . Multiple Vitamin (MULTI-VITAMIN PO) Take 1 tablet by mouth daily.       . predniSONE (DELTASONE) 20 MG tablet Take as directed.  20 tablet  0  . spironolactone (ALDACTONE) 25 MG tablet Take 25 mg by mouth daily.      . sucralfate (CARAFATE) 1 G tablet Take 1 g by mouth 4 (four) times daily as needed. For indigestion      . DISCONTD: buPROPion (WELLBUTRIN XL) 150 MG 24 hr tablet Take 2 tablets (300 mg total) by mouth daily.  60 tablet  1    BP 124/88  Pulse 95  Temp 98.1 F  (36.7 C) (Oral)  Resp 16  Wt 191 lb (86.637 kg)  SpO2 96%  LMP 12/11/2011       Objective:   Physical Exam  Constitutional: She appears well-developed and well-nourished. No distress.  HENT:  Head: Normocephalic and atraumatic.  Cardiovascular: Normal rate and regular rhythm.   Pulmonary/Chest: Effort normal and breath sounds normal. No respiratory distress. She has no wheezes. She has no rales. She exhibits no tenderness.  Abdominal: Soft.  Genitourinary:       Neg CVAT bilaterally  Skin: Skin is warm and dry. No rash noted. No erythema. No pallor.  Psychiatric: She has a normal mood and affect. Her behavior is normal. Judgment and thought content normal.          Assessment & Plan:  Pleuritic chest pain- suspect pleurisy.  Will obtain CXR to exclude underlying pneumonia.  Pt to continue augmentin.  Rx naproxen.   Dysuria- UA notable only for trace blood.  Continue augmentin, send for culture.

## 2012-06-21 NOTE — Patient Instructions (Addendum)
Please complete your chest xray on the first floor.  Call if symptoms worsen, or if not improved in 1 week.  Follow up in 3 months, sooner if problems/concerns.

## 2012-06-24 NOTE — Telephone Encounter (Signed)
Notified pt. 

## 2012-06-26 LAB — URINE CULTURE: Colony Count: 4000

## 2012-07-04 ENCOUNTER — Telehealth: Payer: Self-pay | Admitting: *Deleted

## 2012-07-04 DIAGNOSIS — R739 Hyperglycemia, unspecified: Secondary | ICD-10-CM

## 2012-07-04 NOTE — Telephone Encounter (Signed)
Pt called back and asked if she can come in tomorrow for fasting blood work to confirm the diabetes diagnosis from her OB-GYN. Pt has an appt on Monday, 07/08/12 with Sandford Craze. Please advise.

## 2012-07-04 NOTE — Telephone Encounter (Signed)
Pt called stating she went to her OB-GYN yesterday. They did a insulin-glucose test. The Dr gave her an rx for metformin. Pt is questioning if the test could be high because of what she ate for breakfast. Dr wants her to begin rx today. Called pt back and left a message asking if the test was a fasting glucose test and did they explain why they were giving the rx.

## 2012-07-04 NOTE — Telephone Encounter (Signed)
The ob/gyn's use glucose tolerance test a lot. She can get a fasting glucose done but that may not override an abnormal GTT. She needs to talk with them about her concerns over the test as well

## 2012-07-05 ENCOUNTER — Telehealth: Payer: Self-pay | Admitting: Family

## 2012-07-05 DIAGNOSIS — R739 Hyperglycemia, unspecified: Secondary | ICD-10-CM

## 2012-07-05 NOTE — Telephone Encounter (Signed)
Patient returned phone call and states that she cannot come in this afternoon to have A1c checked. She will come in on Monday morning for that and she rescheduled her Monday appointment for Tuesday afternoon.   Also, she states that her OB-GYN is Dr. Juliene Pina with [facility] and the phone # is [phone]

## 2012-07-05 NOTE — Telephone Encounter (Signed)
Spoke with medical records at Dr [facility] office and they are requiring a signed medical release as we did not refer pt to them. Notified pt. She will sign release with Dr Camillia Herter office on Monday. Future lab order placed and given to the lab.

## 2012-07-05 NOTE — Telephone Encounter (Signed)
Patient wants to have her bloodwork re done by [name].  She wants to be sure she actually has diabetes prior to using the metformin that she was prescribed.  Would feel more comfortable having the test repeated.  She is going to come in on Monday to see Efraim Kaufmann about this and would like to have her blood drawn today so Melissa would have fresh results on Monday.  Would Melissa put in an order for there labs for her today.

## 2012-07-05 NOTE — Telephone Encounter (Signed)
Called pt and left a detailed message to return here today for A1C test. Also, requested the name and number of her OB/GYN to call and request glucose tolerance test results to be faxed.

## 2012-07-05 NOTE — Telephone Encounter (Signed)
Please read notes below and advise.  

## 2012-07-05 NOTE — Telephone Encounter (Signed)
She can return for A1C today diagnosis hyperglycemia.  Also, could we request results from her ob/gyn glucose tolerance test?

## 2012-07-08 ENCOUNTER — Ambulatory Visit: Payer: BC Managed Care – PPO | Admitting: Family

## 2012-07-08 LAB — HEMOGLOBIN A1C
Hgb A1c MFr Bld: 6 % — ABNORMAL HIGH (ref <5.7)
Mean Plasma Glucose: 126 mg/dL — ABNORMAL HIGH (ref <117)

## 2012-07-08 NOTE — Telephone Encounter (Signed)
Pt presented to the lab, future order released. 

## 2012-07-08 NOTE — Addendum Note (Signed)
Addended by: Mervin Kung A on: 07/08/2012 08:29 AM   Modules accepted: Orders

## 2012-07-09 ENCOUNTER — Ambulatory Visit (INDEPENDENT_AMBULATORY_CARE_PROVIDER_SITE_OTHER): Payer: BC Managed Care – PPO | Admitting: Family

## 2012-07-09 ENCOUNTER — Encounter: Payer: Self-pay | Admitting: Family

## 2012-07-09 VITALS — BP 124/86 | HR 82 | Temp 97.8°F | Resp 16 | Wt 190.0 lb

## 2012-07-09 DIAGNOSIS — R7309 Other abnormal glucose: Secondary | ICD-10-CM

## 2012-07-09 DIAGNOSIS — R7303 Prediabetes: Secondary | ICD-10-CM

## 2012-07-09 NOTE — Patient Instructions (Addendum)
Please continue to work hard on diet, exercise, weight loss. Please schedule a follow up appointment in 3 months.

## 2012-07-09 NOTE — Telephone Encounter (Signed)
Results received and given to Provider to discuss with pt at office visit today.

## 2012-07-11 ENCOUNTER — Telehealth: Payer: Self-pay | Admitting: Cardiovascular Disease

## 2012-07-11 DIAGNOSIS — E119 Type 2 diabetes mellitus without complications: Secondary | ICD-10-CM | POA: Insufficient documentation

## 2012-07-11 NOTE — Telephone Encounter (Signed)
msg left that Dr/Nurse out of office today and they will return your call tomorrow. Forwarded to DR/Nurse.

## 2012-07-11 NOTE — Progress Notes (Signed)
  Subjective:    Patient ID: Michele [name], female    DOB: 07-04-1971, 41 y.o.   MRN: [id number]  HPI  Pt brings with her today her blood work from her GYN's office which reveals A1C of 5.8.  A1C performed in our office is 6.0.  She has been instructed by GYN to start metformin.  She is disturbed to learn that she is borderline diabetic.   Overweight- reports that she has been working with a nutritionist and exercising.   Review of Systems See HPI    Objective:   Physical Exam  Constitutional: She appears well-developed and well-nourished. No distress.  Psychiatric: She has a normal mood and affect. Her behavior is normal. Judgment and thought content normal.          Assessment & Plan:

## 2012-07-11 NOTE — Telephone Encounter (Signed)
Dr mody obgyn for pt, would like to start hormone therapy , and wants to discuss with nurse, pls call, not urgent

## 2012-07-11 NOTE — Assessment & Plan Note (Signed)
15 minutes spent with the patient today. >50% of this time was spent counseling pt on diabetes, diet, exercise and weight loss.  We discussed that at this point it is not unreasonable to work hard on diet/exercise and weight loss and monitor off of meds.  Metformin as recommended by her GYN is also an option. I advised her to speak more with Dr. Juliene Pina about this at her apt tomorrow.

## 2012-07-12 NOTE — Telephone Encounter (Signed)
Should be ok. She has hypertension but no cardiac disease. Message passed on to Dr Juliene Pina.

## 2012-08-12 ENCOUNTER — Emergency Department (HOSPITAL_BASED_OUTPATIENT_CLINIC_OR_DEPARTMENT_OTHER)
Admission: EM | Admit: 2012-08-12 | Discharge: 2012-08-12 | Disposition: A | Discharge status: Home / Self Care | Payer: No Typology Code available for payment source | Attending: Emergency Medicine | Admitting: Emergency Medicine

## 2012-08-12 ENCOUNTER — Encounter (HOSPITAL_BASED_OUTPATIENT_CLINIC_OR_DEPARTMENT_OTHER): Payer: Self-pay | Admitting: *Deleted

## 2012-08-12 DIAGNOSIS — Y92410 Unspecified street and highway as the place of occurrence of the external cause: Secondary | ICD-10-CM | POA: Insufficient documentation

## 2012-08-12 DIAGNOSIS — Z862 Personal history of diseases of the blood and blood-forming organs and certain disorders involving the immune mechanism: Secondary | ICD-10-CM | POA: Insufficient documentation

## 2012-08-12 DIAGNOSIS — F411 Generalized anxiety disorder: Secondary | ICD-10-CM | POA: Insufficient documentation

## 2012-08-12 DIAGNOSIS — S139XXA Sprain of joints and ligaments of unspecified parts of neck, initial encounter: Secondary | ICD-10-CM | POA: Insufficient documentation

## 2012-08-12 DIAGNOSIS — S161XXA Strain of muscle, fascia and tendon at neck level, initial encounter: Secondary | ICD-10-CM

## 2012-08-12 DIAGNOSIS — Z79899 Other long term (current) drug therapy: Secondary | ICD-10-CM | POA: Insufficient documentation

## 2012-08-12 DIAGNOSIS — Z8679 Personal history of other diseases of the circulatory system: Secondary | ICD-10-CM | POA: Insufficient documentation

## 2012-08-12 DIAGNOSIS — I10 Essential (primary) hypertension: Secondary | ICD-10-CM | POA: Insufficient documentation

## 2012-08-12 DIAGNOSIS — E119 Type 2 diabetes mellitus without complications: Secondary | ICD-10-CM | POA: Insufficient documentation

## 2012-08-12 DIAGNOSIS — E079 Disorder of thyroid, unspecified: Secondary | ICD-10-CM | POA: Insufficient documentation

## 2012-08-12 DIAGNOSIS — Y93I9 Activity, other involving external motion: Secondary | ICD-10-CM | POA: Insufficient documentation

## 2012-08-12 DIAGNOSIS — Z791 Long term (current) use of non-steroidal anti-inflammatories (NSAID): Secondary | ICD-10-CM | POA: Insufficient documentation

## 2012-08-12 DIAGNOSIS — Z7982 Long term (current) use of aspirin: Secondary | ICD-10-CM | POA: Insufficient documentation

## 2012-08-12 DIAGNOSIS — F3289 Other specified depressive episodes: Secondary | ICD-10-CM | POA: Insufficient documentation

## 2012-08-12 DIAGNOSIS — Z9889 Other specified postprocedural states: Secondary | ICD-10-CM | POA: Insufficient documentation

## 2012-08-12 DIAGNOSIS — Z8669 Personal history of other diseases of the nervous system and sense organs: Secondary | ICD-10-CM | POA: Insufficient documentation

## 2012-08-12 DIAGNOSIS — F329 Major depressive disorder, single episode, unspecified: Secondary | ICD-10-CM | POA: Insufficient documentation

## 2012-08-12 HISTORY — DX: Type 2 diabetes mellitus without complications: E11.9
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
## 2012-08-12 NOTE — ED Provider Notes (Signed)
Medical screening examination/treatment/procedure(s) were performed by non-physician practitioner and as supervising physician I was immediately available for consultation/collaboration.   Matelyn Antonelli B. Bernette Mayers, MD 08/12/12 [phone]
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
## 2012-08-15 ENCOUNTER — Other Ambulatory Visit (HOSPITAL_BASED_OUTPATIENT_CLINIC_OR_DEPARTMENT_OTHER): Payer: BC Managed Care – PPO | Admitting: Lab

## 2012-08-15 ENCOUNTER — Ambulatory Visit (HOSPITAL_BASED_OUTPATIENT_CLINIC_OR_DEPARTMENT_OTHER): Payer: BC Managed Care – PPO | Admitting: Medical

## 2012-08-15 VITALS — BP 102/62 | HR 93 | Temp 98.2°F | Resp 16 | Ht 64.0 in | Wt 190.0 lb

## 2012-08-15 DIAGNOSIS — R1033 Periumbilical pain: Secondary | ICD-10-CM

## 2012-08-15 DIAGNOSIS — R7309 Other abnormal glucose: Secondary | ICD-10-CM

## 2012-08-15 DIAGNOSIS — R197 Diarrhea, unspecified: Secondary | ICD-10-CM

## 2012-08-15 DIAGNOSIS — D509 Iron deficiency anemia, unspecified: Secondary | ICD-10-CM

## 2012-08-15 DIAGNOSIS — R0789 Other chest pain: Secondary | ICD-10-CM

## 2012-08-15 DIAGNOSIS — D508 Other iron deficiency anemias: Secondary | ICD-10-CM

## 2012-08-15 LAB — CBC WITH DIFFERENTIAL (CANCER CENTER ONLY)
BASO%: 0.1 % (ref 0.0–2.0)
EOS%: 1 % (ref 0.0–7.0)
HCT: 36.1 % (ref 34.8–46.6)
LYMPH#: 2.2 10*3/uL (ref 0.9–3.3)
MCHC: 34.6 g/dL (ref 32.0–36.0)
MONO#: 0.5 10*3/uL (ref 0.1–0.9)
NEUT#: 5.4 10*3/uL (ref 1.5–6.5)
NEUT%: 65.6 % (ref 39.6–80.0)
RDW: 13.5 % (ref 11.1–15.7)
WBC: 8.3 10*3/uL (ref 3.9–10.0)

## 2012-08-15 LAB — IRON AND TIBC
%SAT: 25 % (ref 20–55)
Iron: 77 ug/dL (ref 42–145)
TIBC: 307 ug/dL (ref 250–470)
UIBC: 230 ug/dL (ref 125–400)

## 2012-08-15 LAB — RETICULOCYTES (CHCC): ABS Retic: 106.3 10*3/uL (ref 19.0–186.0)

## 2012-08-15 NOTE — Progress Notes (Signed)
Diagnoses: #1 recurrent iron deficiency anemia.  Current therapy: IV iron as indicated.  Last dose of IV her was 05/09/2012  Interim history:[name] presents today for an office followup visit.  Since her last visit here.  She, reports, that she was diagnosed with prediabetes.  She states, she was placed on metformin at thousand milligrams a day.  She states, that she's not really sure if she is prediabetic and is going to followup with Michele [name] regarding this issue.  She states, that she was never given a fasting blood glucose test.  I did advise her that she should at least have a fasting glucose test to know where her sugars stand.  She states, that she was also told not to check her sugars while on the metformin.  She does see Dr. Allena [name], her endocrinologist for thyroid nodules.  She still continues to report, chronic fatigue.  Of note, she, reports, that she's not had a cycle since this past April.  She did followup with her gynecologist, who checked hormone levels, and she was told that she is premenopausal.  The last, time, she received IV iron here was back in August.  She does not report, that she's craving ice.  She reports, she has a good appetite.  She denies any nausea, vomiting, diarrhea, constipation, any chest pain, shortness of breath, or cough.  She denies any fevers, chills, or night sweats.  Denies any palpable adenopathy.  She denies any obvious, or abnormal bleeding.  She denies any abdominal pain.  She denies any headaches, visual changes, or rashes.  Of note, she does have multiple medical issues.  Review of Systems: Constitutional:Negative for malaise/fatigue, fever, chills, weight loss, diaphoresis, activity change, appetite change, and unexpected weight change.  HEENT: Negative for double vision, blurred vision, visual loss, ear pain, tinnitus, congestion, rhinorrhea, epistaxis sore throat or sinus disease, oral pain/lesion, tongue soreness Respiratory: Negative for  cough, chest tightness, shortness of breath, wheezing and stridor.  Cardiovascular: Negative for chest pain, palpitations, leg swelling, orthopnea, PND, DOE or claudication Gastrointestinal: Negative for nausea, vomiting, abdominal pain, diarrhea, constipation, blood in stool, melena, hematochezia, abdominal distention, anal bleeding, rectal pain, anorexia and hematemesis.  Genitourinary: Negative for dysuria, frequency, hematuria,  Musculoskeletal: Negative for myalgias, back pain, joint swelling, arthralgias and gait problem.  Skin: Negative for rash, color change, pallor and wound.  Neurological:. Negative for dizziness/light-headedness, tremors, seizures, syncope, facial asymmetry, speech difficulty, weakness, numbness, headaches and paresthesias.  Hematological: Negative for adenopathy. Does not bruise/bleed easily.  Psychiatric/Behavioral:  Negative for depression, no loss of interest in normal activity or change in sleep pattern.   Physical Exam: This is a pleasant, 41 year old, African American, female, in no obvious distress Vitals: Temp to 90.2, pulse 93, respirations 16, blood pressure 102/62.  Weight 190 pounds HEENT reveals a normocephalic, atraumatic skull, no scleral icterus, no oral lesions  Neck is supple without any cervical or supraclavicular adenopathy.  Lungs are clear to auscultation bilaterally. There are no wheezes, rales or rhonci Cardiac is regular rate and rhythm with a normal S1 and S2. There are no murmurs, rubs, or bruits.  Abdomen is soft with good bowel sounds, there is no palpable mass. There is no palpable hepatosplenomegaly. There is no palpable fluid wave.  Musculoskeletal no tenderness of the spine, ribs, or hips.  Extremities there are no clubbing, cyanosis, or edema.  Skin no petechia, purpura or ecchymosis Neurologic is nonfocal.  Laboratory Data: White count 8.3, hemoglobin 12.5, hematocrit 36.1, MCV 88, platelets  241,000  Current Outpatient  Prescriptions on File Prior to Visit  Medication Sig Dispense Refill  . acetaminophen (TYLENOL) 500 MG tablet Take 1,000-1,500 mg by mouth every 6 (six) hours as needed. For headache      . ALPRAZolam (XANAX) 1 MG tablet Take 1 mg by mouth 4 (four) times daily as needed. For anxiety      . amoxicillin-clavulanate (AUGMENTIN) 875-125 MG per tablet Take 1 tablet by mouth 2 (two) times daily.  14 tablet  0  . aspirin (GNP ASPIRIN) 81 MG EC tablet Take 81 mg by mouth daily.       Marland Kitchen BIOTIN PO Take 1 tablet by mouth daily.       Marland Kitchen FLUoxetine (PROZAC) 20 MG capsule Take 20 mg by mouth daily.      . fluticasone (FLONASE) 50 MCG/ACT nasal spray Place 2 sprays into the nose daily.  16 g  0  . furosemide (LASIX) 20 MG tablet Take 20 mg by mouth every other day.      . levothyroxine (LEVOTHROID) 25 MCG tablet Take 25 mcg by mouth daily.      Marland Kitchen losartan (COZAAR) 100 MG tablet Take 1 tablet (100 mg total) by mouth daily.  30 tablet  6  . metFORMIN (GLUCOPHAGE) 1000 MG tablet Take 1,000 mg by mouth 2 (two) times daily with a meal.      . Multiple Vitamin (MULTI-VITAMIN PO) Take 1 tablet by mouth daily.       . naproxen (NAPROSYN) 500 MG tablet Take 1 tablet (500 mg total) by mouth 2 (two) times daily with a meal.  14 tablet  0  . predniSONE (DELTASONE) 20 MG tablet Take as directed.  20 tablet  0  . spironolactone (ALDACTONE) 25 MG tablet Take 25 mg by mouth daily.      . sucralfate (CARAFATE) 1 G tablet Take 1 g by mouth 4 (four) times daily as needed. For indigestion      . [DISCONTINUED] buPROPion (WELLBUTRIN XL) 150 MG 24 hr tablet Take 2 tablets (300 mg total) by mouth daily.  60 tablet  1   Assessment/Plan: This is a pleasant, 41 year old, female, with the following issues:  #1.  Iron deficiency anemia.  Her hemoglobin and MCV looks good today.  We are getting an iron panel on her.  If, indeed, her iron levels are low, we will call her to come in for IV iron.  #2.  Prediabetes.  I did inform her that  she should at least have her fasting glucose level checked while she is off.  The metformin.  She really states, that she does not want to be on any other medication.  I told her that if, in fact she is prediabetic, maybe, she could control at by exercise and diet.  #3.  Followup.  We will follow back up with her in 3 months, but before then should there be questions or concerns.

## 2012-08-23 ENCOUNTER — Encounter: Payer: Self-pay | Admitting: Oncology

## 2012-08-23 ENCOUNTER — Telehealth: Payer: Self-pay | Admitting: Hematology & Oncology

## 2012-08-23 ENCOUNTER — Other Ambulatory Visit: Payer: Self-pay | Admitting: Oncology

## 2012-08-23 ENCOUNTER — Telehealth: Payer: Self-pay | Admitting: Oncology

## 2012-08-23 DIAGNOSIS — E611 Iron deficiency: Secondary | ICD-10-CM

## 2012-08-23 NOTE — Telephone Encounter (Addendum)
Message copied by Lacie Draft on Fri Aug 23, 2012  1:50 PM ------      Message from: Josph Macho      Created: Fri Aug 16, 2012  7:44 AM       Please call and tell her that her iron is dropping again. Please set upFeraheme at 1020 mg x1 dose. Thanks. Pete  Left message on work Engineer, technical sales. Letter also sent. Teola Bradley, Zamaria Brazzle [employer]

## 2012-08-23 NOTE — Telephone Encounter (Signed)
Pt made 12-20 iron appointment

## 2012-08-30 ENCOUNTER — Ambulatory Visit (HOSPITAL_BASED_OUTPATIENT_CLINIC_OR_DEPARTMENT_OTHER): Payer: BC Managed Care – PPO

## 2012-08-30 VITALS — BP 119/78 | HR 104 | Temp 96.7°F | Resp 18

## 2012-08-30 DIAGNOSIS — D509 Iron deficiency anemia, unspecified: Secondary | ICD-10-CM

## 2012-08-30 DIAGNOSIS — E611 Iron deficiency: Secondary | ICD-10-CM

## 2012-08-30 MED ORDER — SODIUM CHLORIDE 0.9 % IV SOLN
1020.0000 mg | Freq: Once | INTRAVENOUS | Status: AC
  Administered 2012-08-30: 1020 mg via INTRAVENOUS
  Filled 2012-08-30: qty 34

## 2012-08-30 MED ORDER — SODIUM CHLORIDE 0.9 % IV SOLN
INTRAVENOUS | Status: DC
  Administered 2012-08-30: 13:00:00 via INTRAVENOUS

## 2012-08-30 NOTE — Patient Instructions (Signed)
Ferumoxytol injection What is this medicine? FERUMOXYTOL is an iron complex. Iron is used to make healthy red blood cells, which carry oxygen and nutrients throughout the body. This medicine is used to treat iron deficiency anemia in people with chronic kidney disease. This medicine may be used for other purposes; ask your health care provider or pharmacist if you have questions. What should I tell my health care provider before I take this medicine? They need to know if you have any of these conditions: -anemia not caused by low iron levels -high levels of iron in the blood -magnetic resonance imaging (MRI) test scheduled -an unusual or allergic reaction to iron, other medicines, foods, dyes, or preservatives -pregnant or trying to get pregnant -breast-feeding How should I use this medicine? This medicine is for infusion into a vein. It is given by a health care professional in a hospital or clinic setting. Talk to your pediatrician regarding the use of this medicine in children. Special care may be needed. Overdosage: If you think you've taken too much of this medicine contact a poison control center or emergency room at once. Overdosage: If you think you have taken too much of this medicine contact a poison control center or emergency room at once. NOTE: This medicine is only for you. Do not share this medicine with others. What if I miss a dose? It is important not to miss your dose. Call your doctor or health care professional if you are unable to keep an appointment. What may interact with this medicine? This medicine may interact with the following medications: -other iron products This list may not describe all possible interactions. Give your health care provider a list of all the medicines, herbs, non-prescription drugs, or dietary supplements you use. Also tell them if you smoke, drink alcohol, or use illegal drugs. Some items may interact with your medicine. What should I watch  for while using this medicine? Visit your doctor or healthcare professional regularly. Tell your doctor or healthcare professional if your symptoms do not start to get better or if they get worse. You may need blood work done while you are taking this medicine. You may need to follow a special diet. Talk to your doctor. Foods that contain iron include: whole grains/cereals, dried fruits, beans, or peas, leafy green vegetables, and organ meats (liver, kidney). What side effects may I notice from receiving this medicine? Side effects that you should report to your doctor or health care professional as soon as possible: -allergic reactions like skin rash, itching or hives, swelling of the face, lips, or tongue -breathing problems -changes in blood pressure -feeling faint or lightheaded, falls -fever or chills -flushing, sweating, or hot feelings -swelling of the ankles or feet Side effects that usually do not require medical attention (Report these to your doctor or health care professional if they continue or are bothersome.): -diarrhea -headache -nausea, vomiting -stomach pain This list may not describe all possible side effects. Call your doctor for medical advice about side effects. You may report side effects to FDA at 1-800-FDA-1088. Where should I keep my medicine? This drug is given in a hospital or clinic and will not be stored at home. NOTE: This sheet is a summary. It may not cover all possible information. If you have questions about this medicine, talk to your doctor, pharmacist, or health care provider.  2012, Elsevier/Gold Standard. (05/20/2008 9:48:25 PM) 

## 2012-09-24 ENCOUNTER — Ambulatory Visit: Payer: BC Managed Care – PPO | Admitting: Family

## 2012-09-25 ENCOUNTER — Encounter: Payer: Self-pay | Admitting: Family

## 2012-09-25 ENCOUNTER — Ambulatory Visit (INDEPENDENT_AMBULATORY_CARE_PROVIDER_SITE_OTHER): Payer: BC Managed Care – PPO | Admitting: Family

## 2012-09-25 VITALS — BP 122/84 | HR 99 | Temp 98.1°F | Resp 16 | Wt 189.1 lb

## 2012-09-25 DIAGNOSIS — R300 Dysuria: Secondary | ICD-10-CM

## 2012-09-25 DIAGNOSIS — R350 Frequency of micturition: Secondary | ICD-10-CM | POA: Insufficient documentation

## 2012-09-25 DIAGNOSIS — I10 Essential (primary) hypertension: Secondary | ICD-10-CM

## 2012-09-25 DIAGNOSIS — R739 Hyperglycemia, unspecified: Secondary | ICD-10-CM

## 2012-09-25 DIAGNOSIS — R7309 Other abnormal glucose: Secondary | ICD-10-CM

## 2012-09-25 LAB — POCT URINALYSIS DIPSTICK
Bilirubin, UA: NEGATIVE
Glucose, UA: NEGATIVE
Ketones, UA: NEGATIVE
Leukocytes, UA: NEGATIVE
Nitrite, UA: NEGATIVE
pH, UA: 6

## 2012-09-25 LAB — BASIC METABOLIC PANEL
Chloride: 106 mEq/L (ref 96–112)
Potassium: 4.4 mEq/L (ref 3.5–5.3)

## 2012-09-25 MED ORDER — CIPROFLOXACIN HCL 500 MG PO TABS: 500.0000 mg | ORAL_TABLET | Freq: Two times a day (BID) | ORAL | Status: DC
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
## 2012-09-25 NOTE — Patient Instructions (Addendum)
Please complete your lab work prior to leaving. Follow up in 3 months.   

## 2012-09-25 NOTE — Assessment & Plan Note (Signed)
BP looks great on current meds.  Continue same.  

## 2012-09-25 NOTE — Assessment & Plan Note (Signed)
Will rx with cipro. Send urine for culture and microscopic eval given not of trace blood today. Will also check glucose today to rule out hyperglycemia.  She told me that she is not taking metformin as it made her dizzy.

## 2012-09-26 LAB — URINALYSIS, ROUTINE W REFLEX MICROSCOPIC
Bilirubin Urine: NEGATIVE
Glucose, UA: NEGATIVE mg/dL
Specific Gravity, Urine: 1.016 (ref 1.005–1.030)
pH: 6 (ref 5.0–8.0)

## 2012-09-28 LAB — URINE CULTURE: Colony Count: 30000

## 2012-10-08 ENCOUNTER — Emergency Department (HOSPITAL_BASED_OUTPATIENT_CLINIC_OR_DEPARTMENT_OTHER)
Admission: EM | Admit: 2012-10-08 | Discharge: 2012-10-08 | Disposition: A | Discharge status: Home / Self Care | Payer: BC Managed Care – PPO | Attending: Emergency Medicine | Admitting: Emergency Medicine

## 2012-10-08 ENCOUNTER — Other Ambulatory Visit: Payer: Self-pay

## 2012-10-08 ENCOUNTER — Encounter (HOSPITAL_BASED_OUTPATIENT_CLINIC_OR_DEPARTMENT_OTHER): Payer: Self-pay | Admitting: *Deleted

## 2012-10-08 ENCOUNTER — Telehealth: Payer: Self-pay | Admitting: Family

## 2012-10-08 ENCOUNTER — Ambulatory Visit: Payer: BC Managed Care – PPO | Admitting: Family

## 2012-10-08 DIAGNOSIS — F3289 Other specified depressive episodes: Secondary | ICD-10-CM | POA: Insufficient documentation

## 2012-10-08 DIAGNOSIS — F329 Major depressive disorder, single episode, unspecified: Secondary | ICD-10-CM | POA: Insufficient documentation

## 2012-10-08 DIAGNOSIS — Z79899 Other long term (current) drug therapy: Secondary | ICD-10-CM | POA: Insufficient documentation

## 2012-10-08 DIAGNOSIS — E042 Nontoxic multinodular goiter: Secondary | ICD-10-CM | POA: Insufficient documentation

## 2012-10-08 DIAGNOSIS — Z8611 Personal history of tuberculosis: Secondary | ICD-10-CM | POA: Insufficient documentation

## 2012-10-08 DIAGNOSIS — Z7982 Long term (current) use of aspirin: Secondary | ICD-10-CM | POA: Insufficient documentation

## 2012-10-08 DIAGNOSIS — Z862 Personal history of diseases of the blood and blood-forming organs and certain disorders involving the immune mechanism: Secondary | ICD-10-CM | POA: Insufficient documentation

## 2012-10-08 DIAGNOSIS — I10 Essential (primary) hypertension: Secondary | ICD-10-CM | POA: Insufficient documentation

## 2012-10-08 DIAGNOSIS — Z8679 Personal history of other diseases of the circulatory system: Secondary | ICD-10-CM | POA: Insufficient documentation

## 2012-10-08 DIAGNOSIS — R0789 Other chest pain: Secondary | ICD-10-CM | POA: Insufficient documentation

## 2012-10-08 DIAGNOSIS — E119 Type 2 diabetes mellitus without complications: Secondary | ICD-10-CM | POA: Insufficient documentation

## 2012-10-08 DIAGNOSIS — F411 Generalized anxiety disorder: Secondary | ICD-10-CM | POA: Insufficient documentation

## 2012-10-08 LAB — URINALYSIS, ROUTINE W REFLEX MICROSCOPIC
Ketones, ur: NEGATIVE mg/dL
Nitrite: NEGATIVE
Specific Gravity, Urine: 1.006 (ref 1.005–1.030)
pH: 6 (ref 5.0–8.0)

## 2012-10-08 LAB — COMPREHENSIVE METABOLIC PANEL
Alkaline Phosphatase: 54 U/L (ref 39–117)
BUN: 12 mg/dL (ref 6–23)
Chloride: 100 mEq/L (ref 96–112)
Creatinine, Ser: 0.7 mg/dL (ref 0.50–1.10)
GFR calc Af Amer: >90 mL/min (ref >90)
Glucose, Bld: 105 mg/dL — ABNORMAL HIGH (ref 70–99)
Potassium: 4.1 mEq/L (ref 3.5–5.1)
Total Bilirubin: 0.5 mg/dL (ref 0.3–1.2)
Total Protein: 7.5 g/dL (ref 6.0–8.3)

## 2012-10-08 LAB — CBC WITH DIFFERENTIAL/PLATELET
Eosinophils Absolute: 0 10*3/uL (ref 0.0–0.7)
HCT: 40.1 % (ref 36.0–46.0)
Hemoglobin: 14.1 g/dL (ref 12.0–15.0)
Lymphs Abs: 1.6 10*3/uL (ref 0.7–4.0)
MCH: 30.5 pg (ref 26.0–34.0)
MCHC: 35.2 g/dL (ref 30.0–36.0)
MCV: 86.8 fL (ref 78.0–100.0)
Monocytes Absolute: 0.6 10*3/uL (ref 0.1–1.0)
Monocytes Relative: 7 % (ref 3–12)
Neutrophils Relative %: 74 % (ref 43–77)
RBC: 4.62 MIL/uL (ref 3.87–5.11)

## 2012-10-08 LAB — URINE MICROSCOPIC-ADD ON

## 2012-10-08 LAB — PREGNANCY, URINE: Preg Test, Ur: NEGATIVE

## 2012-10-08 LAB — TROPONIN I: Troponin I: <0.3 ng/mL (ref <0.30)

## 2012-10-08 MED ORDER — KETOROLAC TROMETHAMINE 60 MG/2ML IM SOLN
60.0000 mg | Freq: Once | INTRAMUSCULAR | Status: AC
  Administered 2012-10-08: 60 mg via INTRAMUSCULAR
  Filled 2012-10-08: qty 2

## 2012-10-08 NOTE — ED Notes (Signed)
Pt states when she woke up this morning with a feeling of tingling in left side of face. States she then became anxious and began having the pain in chest area. Pt states her vision is a bit blurry in the left eye. No facial droop noted no slurring of speech noted normal neuro exam

## 2012-10-08 NOTE — Telephone Encounter (Signed)
Patient Information:  Caller Name: [name]  Phone: [phone]  Patient: Michele, [name]  Gender: Female  DOB: September 19, 1970  Age: 42 Years  PCP: Sandford Craze (Adults only)  Pregnant: No  Office Follow Up:  Does the office need to follow up with this patient?: No  Instructions For The Office: N/A   Symptoms  Reason For Call & Symptoms: Woke up with numbness on L side of face -cheek and L eye numb and vision blurry-onset 0700. She is havng heaviness in her center of chest and feels anxious. She has hx of HTN- took BP meds last night. L arm feel numb and feels uncordinated. Advised 911- she refused and is going to having someone take her to ED.  Reviewed Health History In EMR: Yes  Reviewed Medications In EMR: Yes  Reviewed Allergies In EMR: Yes  Reviewed Surgeries / Procedures: Yes  Date of Onset of Symptoms: 10/08/2012  Treatments Tried: Xanax .5 mgs 1 PO @ 1030  Treatments Tried Worked: No OB / GYN:  LMP: Unknown  Guideline(s) Used:  Chest Pain  Disposition Per Guideline:   Call EMS 911 Now  Reason For Disposition Reached:   Chest pain lasting longer than 5 minutes and ANY of the following:  Over 83 years old Over 55 years old and at least one cardiac risk factor (i.e., high blood pressure, diabetes, high cholesterol, obesity, smoker or strong family history of heart disease) Pain is crushing, pressure-like, or heavy  Took nitroglycerin and chest pain was not relieved History of heart disease (i.e., angina, heart attack, bypass surgery, angioplasty, CHF)  Advice Given:  N/A  Patient Refused Recommendation:  Patient Will Go To ED  She will have coworker bring her to ER

## 2012-10-08 NOTE — ED Provider Notes (Signed)
History     CSN: [id number]  Arrival date & time 10/08/12  1237   First MD Initiated Contact with Patient 10/08/12 1323      No chief complaint on file.   (Consider location/radiation/quality/duration/timing/severity/associated sxs/prior treatment) HPI Comments: The patient presents here with chest discomfort that started this morning while at work.  It is sharp and in the center of her chest.  She feels short of breath.  She has a long history of this and has been worked up with a heart cath, stress tests, and all have been negative.  This morning her head hurt and vision was blurry along with the episode.    Patient is a 42 y.o. female presenting with chest pain. The history is provided by the patient.  Chest Pain Episode onset: this morning. Chest pain occurs constantly. The chest pain is unchanged. The pain is associated with breathing. The severity of the pain is moderate. The quality of the pain is described as sharp. The pain does not radiate. Chest pain is worsened by deep breathing. Pertinent negatives for primary symptoms include no fever, no shortness of breath, no cough, no nausea and no vomiting. She tried nothing for the symptoms. There are no known risk factors.     Past Medical History  Diagnosis Date  . Depression   . Hypertension   . Anemia     iron deficient, microcytic, hypochromic  . Anxiety   . Migraines   . 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 Fibroids     uterine  . Palpitations     recurrent  . Fatigue   . Tachycardia     unspecified  . Chest pain     atypical  . 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 Allena Katz at [facility]- S/p FNA March/april of two dominant nodules- benign.  Following for annual thyroid US with Dr. Allena Katz.    . Diabetes mellitus without complication     Past Surgical History  Procedure Date  . Appendectomy 03/2009  . Tubal ligation 1997  . Tuboplasty / tubotubal anastomosis 2005  . Biopsy thyroid November 28, 2009    Dr. Allena Katz- endo  . Dilation and curettage of uterus 05/21/2011    Procedure: DILATATION AND CURETTAGE (D&C);  Surgeon: Robley Fries;  Location: [facility];  Service: Gynecology;  Laterality: N/A;  dilitation and currettage/endometrial currettings    Family History  Problem Relation Age of Onset  . Hypertension Mother   . Cancer Mother 59    breast  . Cancer Father     colon  . Stroke Brother     handicapped due to complications from spinal meningitis  . Seizures Brother   . Asthma Son   . Arthritis Maternal Grandmother   . Hypertension Maternal Grandmother   . Stroke Maternal Grandfather     History  Substance Use Topics  . Smoking status: Never Smoker   . Smokeless tobacco: Never Used  . Alcohol Use: Yes     Comment: Once a month    OB History    Grav Para Term Preterm Abortions TAB SAB Ect Mult Living   4 2 1 1 2 1  1  2       Review of Systems  Constitutional: Negative for fever.  Respiratory: Negative for cough and shortness of breath.   Cardiovascular: Positive for chest pain.  Gastrointestinal: Negative for nausea and vomiting.  All other systems reviewed and are negative.  Allergies  Amlodipine and Wellbutrin  Home Medications   Current Outpatient Rx  Name  Route  Sig  Dispense  Refill  . ACETAMINOPHEN 500 MG PO TABS   Oral   Take 1,000-1,500 mg by mouth every 6 (six) hours as needed. For headache         . ALPRAZOLAM 1 MG PO TABS   Oral   Take 1 mg by mouth 4 (four) times daily as needed. For anxiety         . ASPIRIN 81 MG PO TBEC   Oral   Take 81 mg by mouth daily.          Marland Kitchen BIOTIN PO   Oral   Take 1 tablet by mouth daily.          Marland Kitchen CIPROFLOXACIN HCL 500 MG PO TABS   Oral   Take 1 tablet (500 mg total) by mouth 2 (two) times daily.   6 tablet   0   . FLUTICASONE PROPIONATE 50 MCG/ACT NA SUSP   Nasal   Place 2 sprays into the nose daily.   16 g   0   . FUROSEMIDE 20 MG PO TABS   Oral   Take 20 mg by mouth every  other day.         Marland Kitchen LEVOTHYROXINE SODIUM 25 MCG PO TABS   Oral   Take 25 mcg by mouth daily.         Marland Kitchen LOSARTAN POTASSIUM 100 MG PO TABS   Oral   Take 1 tablet (100 mg total) by mouth daily.   30 tablet   6   . METFORMIN HCL 1000 MG PO TABS   Oral   Take 500 mg by mouth 2 (two) times daily with a meal.          . MULTI-VITAMIN PO   Oral   Take 1 tablet by mouth daily.          Marland Kitchen NAPROXEN 500 MG PO TABS   Oral   Take 1 tablet (500 mg total) by mouth 2 (two) times daily with a meal.   14 tablet   0   . SPIRONOLACTONE 25 MG PO TABS   Oral   Take 25 mg by mouth daily.         . SUCRALFATE 1 G PO TABS   Oral   Take 1 g by mouth 4 (four) times daily as needed. For indigestion           BP 151/91  Pulse 117  Temp 97.5 F (36.4 C) (Oral)  Resp 2  Ht 5\' 4"  (1.626 m)  Wt 185 lb (83.915 kg)  BMI 31.76 kg/m2  SpO2 100%  Physical Exam  Nursing note and vitals reviewed. Constitutional: She is oriented to person, place, and time. She appears well-developed and well-nourished. No distress.  HENT:  Head: Normocephalic and atraumatic.  Mouth/Throat: Oropharynx is clear and moist.  Eyes: EOM are normal. Pupils are equal, round, and reactive to light.  Neck: Normal range of motion. Neck supple.  Cardiovascular: Normal rate and regular rhythm.  Exam reveals no gallop and no friction rub.   No murmur heard. Pulmonary/Chest: Effort normal and breath sounds normal. No respiratory distress. She has no wheezes.  Abdominal: Soft. Bowel sounds are normal. She exhibits no distension. There is no tenderness.  Musculoskeletal: Normal range of motion.  Neurological: She is alert and oriented to person, place, and time. No cranial nerve deficit. She exhibits  normal muscle tone. Coordination normal.  Skin: Skin is warm and dry. She is not diaphoretic.    ED Course  Procedures (including critical care time)  Labs Reviewed  URINALYSIS, ROUTINE W REFLEX MICROSCOPIC -  Abnormal; Notable for the following:    Leukocytes, UA SMALL (*)     All other components within normal limits  PREGNANCY, URINE  URINE MICROSCOPIC-ADD ON  CBC WITH DIFFERENTIAL  COMPREHENSIVE METABOLIC PANEL  TROPONIN I   No results found.   No diagnosis found.   Date: 10/08/2012  Rate: 102  Rhythm: sinus tachycardia  QRS Axis: normal  Intervals: normal  ST/T Wave abnormalities: normal  Conduction Disutrbances:none  Narrative Interpretation:   Old EKG Reviewed: unchanged    MDM  Workup unremarkable, patient tells me she is feeling better.  She has had extensive workup into these symptoms and no cause has been found.  I see no indication for further workup at this time.  Will discharge to home, to return prn if she worsens.  I have also recommended follow up with pcp to discuss these issues.        Geoffery Lyons, MD 10/08/12 [phone]

## 2012-10-08 NOTE — ED Notes (Signed)
Asked pt to change into gown in order to do the ekg in a timely manner as her complaint is chest pain pt insists that she go to the restroom first ambulatory to restroom with steady gait and in no apparent distress

## 2012-10-28 ENCOUNTER — Other Ambulatory Visit: Payer: Self-pay | Admitting: Cardiovascular Disease

## 2012-10-30 ENCOUNTER — Other Ambulatory Visit: Payer: Self-pay | Admitting: *Deleted

## 2012-10-30 MED ORDER — LOSARTAN POTASSIUM 100 MG PO TABS: ORAL_TABLET | ORAL | Status: DC

## 2012-10-30 NOTE — Telephone Encounter (Signed)
..   Requested Prescriptions   Pending Prescriptions Disp Refills  . losartan (COZAAR) 100 MG tablet [Pharmacy Med Name: LOSARTAN 100MG    TAB] 30 tablet 1    Sig: TAKE ONE TABLET BY MOUTH EVERY DAY  .Patient needs to contact office to schedule  Appointment  for  6 months follow up.Ph:[phone]. Thank you.

## 2012-11-05 ENCOUNTER — Encounter: Payer: Self-pay | Admitting: Pulmonary Disease

## 2012-11-05 ENCOUNTER — Ambulatory Visit (INDEPENDENT_AMBULATORY_CARE_PROVIDER_SITE_OTHER): Payer: BC Managed Care – PPO | Admitting: Pulmonary Disease

## 2012-11-05 VITALS — BP 116/78 | HR 94 | Temp 97.7°F | Ht 64.0 in | Wt 190.0 lb

## 2012-11-05 DIAGNOSIS — G4733 Obstructive sleep apnea (adult) (pediatric): Secondary | ICD-10-CM

## 2012-11-05 DIAGNOSIS — F419 Anxiety disorder, unspecified: Secondary | ICD-10-CM

## 2012-11-05 DIAGNOSIS — F411 Generalized anxiety disorder: Secondary | ICD-10-CM

## 2012-11-05 NOTE — Assessment & Plan Note (Signed)
Persistent chest pressure with normal cardiopulmonary evaluation -attributed to anxiety

## 2012-11-05 NOTE — Progress Notes (Signed)
  Subjective:    Patient ID: Michele [name], female    DOB: Oct 14, 1970, 42 y.o.   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 However she also reports a cough and dyspnea.  Initial OV 4/13 Review[facility] 6[facility]12 > mild OSA with AHI 8/h, REM AHI 30/h corrected by CPAP 7 cm  Spirometry nml. States currently using a cpap 5 days a week for about 8 hours a night.  She reported waking up with a feeling of gas pain or choking episodes during sleep. Epworth sleepiness score was 11/24. Bedtime is 10:30 to 11 PM, sleep latency was less than to 10 minutes, 1-2 awakenings without any post void sleep latency, out of bed at 6:30 AM feeling tired and occasional headache with dryness of mouth. She gained 30 pounds in the last 2 years but has lost 18 in the last 3 months. CPAP is set at 7 cm, D[facility]  Tube was dyspnea for about 2 months and excessive daytime fatigue. This is noted at exertion as well as at rest and periodically she feels the need to take a deep breath. CT angiogram was negative for pulmonary embolism. Thyroid nodules were noted to be benign an ultrasound.  She reports an a.m. cough mostly dry for the last 2 weeks.  She reports symptoms of heartburn and chest discomfort about 2-3 times per week. This appears to be meal related. Anxiety is being considered as a cause of some of her symptoms.  Spirometry did not show any evidence of airway obstruction   >> omez x 6 wks 11/05/2012 1 y FU  Download reviewd 03/22/11 to 12/20/11 - 7 cm is adequate pressure.  pt states cough is gone. Pt states every night she is waking up with a severe headache so she has not been using her cpap for several months. C/o increased fatigue after stopping cpap  Pt does c/o having daytime fatigue.  She continues to have almost daily chest pressure- extensive evaluation incl cath was negative - attributed to anxiety, but pt is not happy with  this diagnosis  Review of Systems neg for any significant sore throat, dysphagia, itching, sneezing, nasal congestion or excess/ purulent secretions, fever, chills, sweats, unintended wt loss, pleuritic or exertional cp, hempoptysis, orthopnea pnd or change in chronic leg swelling. Also denies presyncope, palpitations, heartburn, abdominal pain, nausea, vomiting, diarrhea or change in bowel or urinary habits, dysuria,hematuria, rash, arthralgias, visual complaints, headache, numbness weakness or ataxia.     Objective:   Physical Exam  Gen. Pleasant, well-nourished, in no distress ENT - no lesions, no post nasal drip Neck: No JVD, no thyromegaly, no carotid bruits Lungs: no use of accessory muscles, no dullness to percussion, clear without rales or rhonchi  Cardiovascular: Rhythm regular, heart sounds  normal, no murmurs or gallops, no peripheral edema Musculoskeletal: No deformities, no cyanosis or clubbing         Assessment & Plan:

## 2012-11-05 NOTE — Patient Instructions (Addendum)
Trial of nasal pillows  Call me in 2 weeks with feedback We can try dental appliance if you are unable to use cpap

## 2012-11-05 NOTE — Assessment & Plan Note (Signed)
Trial of nasal pillows, she will Call back in 2 weeks with feedback We can try dental appliance if she is unable to use cpap due to headache  Weight loss encouraged, compliance with goal of at least 4-6 hrs every night is the expectation. Advised against medications with sedative side effects Cautioned against driving when sleepy - understanding that sleepiness will vary on a day to day basis

## 2012-11-13 ENCOUNTER — Telehealth: Payer: Self-pay | Admitting: Hematology & Oncology

## 2012-11-13 ENCOUNTER — Ambulatory Visit: Payer: BC Managed Care – PPO | Admitting: Medical

## 2012-11-13 ENCOUNTER — Other Ambulatory Visit: Payer: BC Managed Care – PPO | Admitting: Lab

## 2012-11-13 NOTE — Telephone Encounter (Signed)
Patient called and cx 11/13/12 and resch for 12/02/12

## 2012-11-15 ENCOUNTER — Ambulatory Visit: Payer: BC Managed Care – PPO | Admitting: Cardiovascular Disease

## 2012-11-22 ENCOUNTER — Encounter: Payer: Self-pay | Admitting: Family

## 2012-11-22 ENCOUNTER — Telehealth: Payer: Self-pay | Admitting: Family

## 2012-11-22 NOTE — Telephone Encounter (Signed)
Dr. Vickey [name] asked me to make sure that Dr. Vassie [name] felt that her OSA treatment was optimized as OSA can lead to HA.  Reviewed with pt.   Reports that her BP has been elevated 158/97.  I advised her to call the HTN clinic to arrange follow up. She continues to have eye numbness. 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  I have advised pt to arrange follow up in our office for some time next week.

## 2012-11-22 NOTE — Telephone Encounter (Signed)
Patient informed; had OV w/Dr. Vassie Loll on 02.25.14 & also would like to know how Dr. Vassie Loll correlates with Dr. Vickey Huger.?/SLS Please advise.

## 2012-11-22 NOTE — Telephone Encounter (Signed)
Pls let pt know that I have reviewed recommendations from Dr. Vickey Huger. I see that she is past due for follow up with Dr. Vassie Loll.  Please ask her to call Marj to arrange a follow up pulmonary visit.

## 2012-12-02 ENCOUNTER — Other Ambulatory Visit: Payer: BC Managed Care – PPO | Admitting: Lab

## 2012-12-02 ENCOUNTER — Ambulatory Visit: Payer: BC Managed Care – PPO | Admitting: Medical

## 2012-12-02 ENCOUNTER — Telehealth: Payer: Self-pay | Admitting: Hematology & Oncology

## 2012-12-02 NOTE — Telephone Encounter (Signed)
Patient called and cx 12/02/12 apt and resch for 12/09/12

## 2012-12-09 ENCOUNTER — Telehealth: Payer: Self-pay | Admitting: Hematology & Oncology

## 2012-12-09 ENCOUNTER — Other Ambulatory Visit: Payer: BC Managed Care – PPO | Admitting: Lab

## 2012-12-09 ENCOUNTER — Ambulatory Visit: Payer: BC Managed Care – PPO | Admitting: Medical

## 2012-12-09 NOTE — Telephone Encounter (Signed)
Pt rescheduled 3-31 to 4-9 she is sick

## 2012-12-10 ENCOUNTER — Ambulatory Visit (INDEPENDENT_AMBULATORY_CARE_PROVIDER_SITE_OTHER): Payer: BC Managed Care – PPO | Admitting: Family Medicine

## 2012-12-10 ENCOUNTER — Ambulatory Visit (HOSPITAL_BASED_OUTPATIENT_CLINIC_OR_DEPARTMENT_OTHER)
Admission: RE | Admit: 2012-12-10 | Discharge: 2012-12-10 | Disposition: A | Discharge status: Home / Self Care | Payer: BC Managed Care – PPO | Source: Ambulatory Visit | Attending: Family Medicine | Admitting: Family Medicine

## 2012-12-10 ENCOUNTER — Inpatient Hospital Stay (HOSPITAL_COMMUNITY)
Admission: AD | Admit: 2012-12-10 | Discharge: 2012-12-10 | Disposition: A | Discharge status: Home / Self Care | Payer: BC Managed Care – PPO | Source: Ambulatory Visit | Attending: Obstetrics | Admitting: Obstetrics

## 2012-12-10 ENCOUNTER — Encounter (HOSPITAL_BASED_OUTPATIENT_CLINIC_OR_DEPARTMENT_OTHER): Payer: Self-pay

## 2012-12-10 ENCOUNTER — Encounter: Payer: Self-pay | Admitting: Family Medicine

## 2012-12-10 ENCOUNTER — Other Ambulatory Visit: Payer: Self-pay | Admitting: Family

## 2012-12-10 ENCOUNTER — Encounter (HOSPITAL_COMMUNITY): Payer: Self-pay | Admitting: *Deleted

## 2012-12-10 ENCOUNTER — Telehealth: Payer: Self-pay | Admitting: *Deleted

## 2012-12-10 ENCOUNTER — Inpatient Hospital Stay (HOSPITAL_COMMUNITY): Payer: BC Managed Care – PPO

## 2012-12-10 VITALS — BP 128/70 | HR 98 | Temp 98.0°F | Ht 64.5 in | Wt 182.8 lb

## 2012-12-10 DIAGNOSIS — R1032 Left lower quadrant pain: Secondary | ICD-10-CM

## 2012-12-10 DIAGNOSIS — R1900 Intra-abdominal and pelvic swelling, mass and lump, unspecified site: Secondary | ICD-10-CM | POA: Insufficient documentation

## 2012-12-10 DIAGNOSIS — D250 Submucous leiomyoma of uterus: Secondary | ICD-10-CM | POA: Insufficient documentation

## 2012-12-10 DIAGNOSIS — Z1231 Encounter for screening mammogram for malignant neoplasm of breast: Secondary | ICD-10-CM

## 2012-12-10 DIAGNOSIS — R1031 Right lower quadrant pain: Secondary | ICD-10-CM | POA: Insufficient documentation

## 2012-12-10 DIAGNOSIS — N83209 Unspecified ovarian cyst, unspecified side: Secondary | ICD-10-CM | POA: Insufficient documentation

## 2012-12-10 LAB — CBC WITH DIFFERENTIAL/PLATELET
Basophils Absolute: 0 10*3/uL (ref 0.0–0.1)
Lymphocytes Relative: 26.9 % (ref 12.0–46.0)
Monocytes Relative: 5.7 % (ref 3.0–12.0)
Platelets: 201 10*3/uL (ref 150.0–400.0)
RDW: 13 % (ref 11.5–14.6)
WBC: 7.7 10*3/uL (ref 4.5–10.5)

## 2012-12-10 LAB — BASIC METABOLIC PANEL
CO2: 27 mEq/L (ref 19–32)
Chloride: 103 mEq/L (ref 96–112)
Potassium: 4.6 mEq/L (ref 3.5–5.1)
Sodium: 136 mEq/L (ref 135–145)

## 2012-12-10 MED ORDER — OXYCODONE-ACETAMINOPHEN 5-325 MG PO TABS: 2.0000 | ORAL_TABLET | Freq: Four times a day (QID) | ORAL | Status: DC | PRN

## 2012-12-10 MED ORDER — IBUPROFEN 600 MG PO TABS: 600.0000 mg | ORAL_TABLET | Freq: Four times a day (QID) | ORAL | Status: DC | PRN

## 2012-12-10 MED ORDER — KETOROLAC TROMETHAMINE 60 MG/2ML IM SOLN
60.0000 mg | Freq: Once | INTRAMUSCULAR | Status: AC
  Administered 2012-12-10: 60 mg via INTRAMUSCULAR
  Filled 2012-12-10 (×2): qty 2

## 2012-12-10 MED ORDER — IBUPROFEN 200 MG PO TABS: 600.0000 mg | ORAL_TABLET | Freq: Four times a day (QID) | ORAL | Status: DC | PRN

## 2012-12-10 MED ORDER — IOHEXOL 300 MG/ML  SOLN
100.0000 mL | Freq: Once | INTRAMUSCULAR | Status: AC | PRN
  Administered 2012-12-10: 100 mL via INTRAVENOUS

## 2012-12-10 NOTE — H&P (Signed)
CC: pelvic mass, abdominal pain  HPI:  Pt notes abdominal pain since Sat. Worse today, called PCP, went in for eval and given her pain level was scheduled for an outpt CT scan. CT scan showed mass, pt was called by PCP when results came in and was told to come to [facility].   Pt notes occ pain off an on but since Sat abdominal pain more consistent. BM once daily. No pain w/ urination but "pressure" "feels heavy" when voiding in suprapubic area. No fevers. No new pain w/ IC. Tried tylenol for pain which helped some but did not eliminate the pain. No N/V.  No hormones at this time. Pt was seen 10/13 for amenorrhea (no menses 3-4 month), had normal pelvic u/s; 1.9 cm fibroid, nl small ovaries (3 and 4 cm), EMS <10 mm. Pt diagnosed w/ premature ovarian failure (elevated FSH). Was given short trial of HRT but pt was concerned about bp and stopped. Had bleed upon stopping HRT and no bleeding since then  PMH: htn, pre-DM, premature ovarian failure, anemia, goiter Ectopic x 2, treated w/ MTX (after tubal reversal). SVD x 2.  Infertility: did 1 yr Clomid then 2 months IVF  Meds: synthroid, metformin, losartan, lasix prn, spirolactone All: Wellbutrin, amlodipine  FH: mom w/ breast CA x 2 (50, then again in [facility] breast at age 44). Dad w/ colon CA (died in later 41's). No other breast CA, no ovarian CA.  PE:  Filed Vitals:   12/10/12 1650  BP: 130/86  Pulse: 103  Temp: 98.2 F (36.8 C)  TempSrc: Oral  Resp: 16  Height: 5' 4.5" (1.638 m)  Weight: 82.555 kg (182 lb)   Gen: well appearing, no distress, moving around the room freely and laughing and joking with husband and myself Cardiovascular: Regular rate and rhythm Pulmonary: Clear to auscultation bilaterally Abdomen: Slight tenderness and left lower caution to deep palpation, no significant distention, no rebound, no guarding, nonacute, no right upper quadrant pain, no hepatomegaly GU: Normal external genitalia, no vaginal blood, left adnexal  fullness with tenderness, particular tenderness when manipulating cervix and posterior left uterus, no discrete mass palpated, no fullness in right adnexa Lower extremity: Nontender, no edema  CBC: Hemoglobin 13, no elevation in white count Complete metabolic profile: Normal UPT: Negative CT scan:  **ADDENDUM** CREATED: 12/10/2012 15:42:06  6.5 x 7 x 6.4 cm cystic mass is impressing upon the dome of the  urinary bladder (not the liver as initially transcribed). This was  discussed with Dr.Tabori.  **END ADDENDUM** SIGNED BY: Almedia Balls. Constance Goltz, M.D.      Study Result    *RADIOLOGY REPORT*  Clinical Data: Left lower quadrant pain. History of fibroids and  ectopic pregnancy. Rule out diverticulitis versus an ovarian cyst  or ectopic pregnancy (negative urine pregnancy test in office).  History diabetes, hypertension, anemia, appendectomy and tubal  ligation.  CT ABDOMEN AND PELVIS WITH CONTRAST  Technique: Multidetector CT imaging of the abdomen and pelvis was  performed following the standard protocol during bolus  administration of intravenous contrast.  Contrast: OMNIPAQUE IOHEXOL 300 MG/ML SOLN  Comparison: 05/17/2012.  Findings: 6.5 x 7 x 6.4 cm cystic mass impressing upon the dome of  the liver. This may be primary ovarian in origin. Pelvic sonogram  recommended for further delineation. Small amount of free fluid in  the cul-de-sac may be related to partial ovarian cyst rupture or  portion contributing to patient's discomfort.  No extraluminal bowel inflammatory process, free fluid or free  air.  Fatty infiltration liver with tiny calcifications. No worrisome  hepatic, splenic, renal, adrenal or pancreatic lesion. No  calcified gallstones.  Mild age advanced atherosclerotic type changes lower thoracic  aorta, internal iliac arteries and left femoral artery. No  abdominal aortic aneurysm.  No adenopathy.  No bony destructive lesion.  IMPRESSION:  6.5 x 7 x 6.4 cm  cystic mass impressing upon the dome of the liver.  This may be primary ovarian in origin. Pelvic sonogram recommended  for further delineation. Small amount of free fluid in the cul-de-  sac may be related to partial ovarian cyst rupture or ovarian  torsion contributing to patient's discomfort.     Ultrasound: TRANSABDOMINAL AND TRANSVAGINAL ULTRASOUND OF PELVIS  Technique: Both transabdominal and transvaginal ultrasound  examinations of the pelvis were performed. Transabdominal technique  was performed for global imaging of the pelvis including uterus,  ovaries, adnexal regions, and pelvic cul-de-sac.  It was necessary to proceed with endovaginal exam following the  transabdominal exam to visualize the endometrium.  Comparison: CT abdomen pelvis dated 12/10/2012  Findings:  Uterus: Measures 5.5 x 11.0 x 6.2 cm. 2.1 x 2.0 x 2.4 cm  submucosal fibroid.  Endometrium: Normal in thickness and appearance, measuring 10 mm.  Right ovary: Measures 6.7 x 2.9 x 3.4 cm and is notable for a  dominant cyst/follicle measuring 3.7 x 2.1 x 2.9 cm.  Left ovary: Not definitely visualized.  Other findings: 7.0 x 6.0 x 5.4 cm complex cystic lesion in the  midline pelvis with associated mural nodule (image 66). No free  fluid.  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 OB-GYN consultation is  advised. If further imaging is desired, consider MRI pelvis  with/without contrast on an outpatient basis.  2.4 cm submucosal fibroid.  Dominant 3.7 cm right ovarian cyst / follicle.   Assessment and plan: 42 year old G4 P2 0-2 nonpregnant patient presents with 3 days of worsening left lower quadrant pain and suprapubic fullness he was found to have a 7 cm complex cystic mass in the midline pelvis of probable ovarian origin. By ultrasound images this cystic mass mostly appears simple with a single septation with mural nodularity. No  separate left ovary is seen. While only 1 Doppler image of this mass was available to view, there did not appear to be increased vascular flow to this mass. In addition patient has an enlarged right ovary with 3.7 cm simple cyst versus follicle.  A cyst is favored given patient's history of premature ovarian failure. Patient clinically is stable with excellent pain control with a single dose of Toradol. There is no evidence of acute surgical abdomen. While ovarian torsion is in the differential diagnosis from the radiologist, patient clinically does not have ovarian torsion do to her tender but not surgical abdomen. In addition patient is afebrile with normal white cell count. Malignant neoplasm was also noted in the radiologist the differential diagnosis. We will have the radiologist read read the CT scan to look for any pelvic lymphadenopathy. Patient does have a history of greater than 12 months of use of fertility medications and her mother has had breast cancer x2. These are possible risk factors for ovarian neoplasm. Given patient's symptoms and large mass with mural nodularity I discussed with the patient tonight the possible diagnoses, including potential malignancy. Due to these possibilities I have prepared the patient for left salpingo-oophorectomy but will leave final decision for surgery to  her primary gynecologist. I would give consideration to  tumor markers and possibly OVA-1 preoperatively. The patient is aware she will need followup later this week in the outpatient setting.   Pain. Patient has done well with single dose of Toradol however given the possibility of upcoming surgery will not continue Toradol. She has been given Motrin and Percocet for pain.   Alcie Runions A. 12/10/2012 9:24 PM

## 2012-12-10 NOTE — MAU Provider Note (Signed)
History     CSN: [id number]  Arrival date and time: 12/10/12 1633   First Provider Initiated Contact with Patient 12/10/12 1728      Chief Complaint  Patient presents with  . Ovarian Cyst   HPI [name] is a 42 y.o. [id number] who presents to MAU with RLQ pain. The pain started on Saturday and has become progressively worsened since then. The patient was seen at [facility] today and sent for a CT scan. The CT scan showed a large cystic structure that appeared to be ovarian in origin. The patient was advised by her PCP to come to MAU for [name] evaluation as recommended by the radiologist. The patient took some tylenol yesterday, but has been NPO most of the day today including medications because of the CT scan. She denies N/V or fever. She has irregular periods because she is "peri-menopausal." LMP was 09/24/12.   OB History   Grav Para Term Preterm Abortions TAB SAB Ect Mult Living   4 2 1 1 2 1  1  2       Past Medical History  Diagnosis Date  . Depression   . Hypertension   . Anemia     iron deficient, microcytic, hypochromic  . Anxiety   . Migraines   . 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 Fibroids     uterine  . Palpitations     recurrent  . Fatigue   . Tachycardia     unspecified  . Chest pain     atypical  . 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 Allena Katz at [facility]- S/p FNA March/april of two dominant nodules- benign.  Following for annual thyroid US with Dr. Allena Katz.    . Diabetes mellitus without complication     Past Surgical History  Procedure Laterality Date  . Appendectomy  03/2009  . Tubal ligation  1997  . Tuboplasty / tubotubal anastomosis  2005  . Biopsy thyroid  November 28, 2009    Dr. Allena Katz- endo  . Dilation and curettage of uterus  05/21/2011    Procedure: DILATATION AND CURETTAGE (D&C);  Surgeon: Robley Fries;  Location: [facility];  Service: Gynecology;  Laterality: N/A;  dilitation and currettage/endometrial currettings     Family History  Problem Relation Age of Onset  . Hypertension Mother   . Cancer Mother 65    breast  . Cancer Father     colon  . Stroke Brother     handicapped due to complications from spinal meningitis  . Seizures Brother   . Asthma Son   . Arthritis Maternal Grandmother   . Hypertension Maternal Grandmother   . Stroke Maternal Grandfather     History  Substance Use Topics  . Smoking status: Never Smoker   . Smokeless tobacco: Never Used  . Alcohol Use: Yes     Comment: Once a month    Allergies:  Allergies  Allergen Reactions  . Amlodipine Shortness Of Breath and Palpitations  . Wellbutrin (Bupropion Hcl) Other (See Comments)    Dizziness, increased anxiety    Prescriptions prior to admission  Medication Sig Dispense Refill  . acetaminophen (TYLENOL) 500 MG tablet Take 1,000-1,500 mg by mouth every 6 (six) hours as needed. For headache      . ALPRAZolam (XANAX) 1 MG tablet Take 1 mg by mouth 4 (four) times daily as needed. For anxiety      . aspirin (GNP ASPIRIN) 81 MG EC tablet  Take 81 mg by mouth daily.       Marland Kitchen BIOTIN PO Take 1 tablet by mouth daily.       Marland Kitchen losartan (COZAAR) 100 MG tablet TAKE ONE TABLET BY MOUTH EVERY DAY  30 tablet  1  . metFORMIN (GLUCOPHAGE) 500 MG tablet Take 250 mg by mouth 2 (two) times daily with a meal.      . Multiple Vitamin (MULTIVITAMIN WITH MINERALS) TABS Take 1 tablet by mouth daily.      Marland Kitchen spironolactone (ALDACTONE) 25 MG tablet Take 25 mg by mouth daily.        Review of Systems  Constitutional: Negative for fever and malaise/fatigue.  Gastrointestinal: Positive for abdominal pain. Negative for nausea, vomiting, diarrhea and constipation.  Genitourinary: Positive for frequency. Negative for dysuria and urgency.       Neg - vaginal bleeding Neg - vaginal discharge  Neurological: Positive for dizziness. Negative for loss of consciousness.   Physical Exam   Blood pressure 130/86, pulse 103, temperature 98.2 F (36.8  C), temperature source Oral, resp. rate 16, height 5' 4.5" (1.638 m), weight 182 lb (82.555 kg), last menstrual period 09/24/2012.  Physical Exam  Constitutional: She is oriented to person, place, and time. She appears well-developed and well-nourished. No distress.  HENT:  Head: Normocephalic and atraumatic.  Cardiovascular: Normal rate, regular rhythm and normal heart sounds.   Respiratory: Effort normal and breath sounds normal. No respiratory distress.  GI: Soft. Bowel sounds are normal. She exhibits no mass. There is tenderness (moderate tenderness to palpation of the lower abdomen bilaterally). There is no rebound and no guarding.  Neurological: She is alert and oriented to person, place, and time.  Skin: Skin is warm and dry. No erythema.  Psychiatric: She has a normal mood and affect.   Results for orders placed in visit on 12/10/12 (from the past 24 hour(s))  CBC WITH DIFFERENTIAL     Status: None   Collection Time    12/10/12  9:52 AM      Result Value Range   WBC 7.7  4.5 - 10.5 K/uL   RBC 4.41  3.87 - 5.11 Mil/uL   Hemoglobin 13.3  12.0 - 15.0 g/dL   HCT 16.1  09.6 - 04.5 %   MCV 88.4  78.0 - 100.0 fl   MCHC 34.1  30.0 - 36.0 g/dL   RDW 40.9  81.1 - 91.4 %   Platelets 201.0  150.0 - 400.0 K/uL   Neutrophils Relative 66.9  43.0 - 77.0 %   Lymphocytes Relative 26.9  12.0 - 46.0 %   Monocytes Relative 5.7  3.0 - 12.0 %   Eosinophils Relative 0.4  0.0 - 5.0 %   Basophils Relative 0.1  0.0 - 3.0 %   Neutro Abs 5.1  1.4 - 7.7 K/uL   Lymphs Abs 2.1  0.7 - 4.0 K/uL   Monocytes Absolute 0.4  0.1 - 1.0 K/uL   Eosinophils Absolute 0.0  0.0 - 0.7 K/uL   Basophils Absolute 0.0  0.0 - 0.1 K/uL  BASIC METABOLIC PANEL     Status: None   Collection Time    12/10/12  9:52 AM      Result Value Range   Sodium 136  135 - 145 mEq/L   Potassium 4.6  3.5 - 5.1 mEq/L   Chloride 103  96 - 112 mEq/L   CO2 27  19 - 32 mEq/L   Glucose, Bld 84  70 - 99 mg/dL  BUN 12  6 - 23 mg/dL    Creatinine, Ser 0.8  0.4 - 1.2 mg/dL   Calcium 9.0  8.4 - 16.1 mg/dL   GFR 096.04  >54.09 mL/min  POCT URINE PREGNANCY     Status: None   Collection Time    12/10/12 10:18 AM      Result Value Range   Preg Test, Ur Negative     *RADIOLOGY REPORT*  Clinical Data: Evaluate cystic mass seen on CT  TRANSABDOMINAL AND TRANSVAGINAL ULTRASOUND OF PELVIS  Technique: Both transabdominal and transvaginal ultrasound  examinations of the pelvis were performed. Transabdominal technique  was performed for global imaging of the pelvis including uterus,  ovaries, adnexal regions, and pelvic cul-de-sac.  It was necessary to proceed with endovaginal exam following the  transabdominal exam to visualize the endometrium.  Comparison: CT abdomen pelvis dated 12/10/2012  Findings:  Uterus: Measures 5.5 x 11.0 x 6.2 cm. 2.1 x 2.0 x 2.4 cm  submucosal fibroid.  Endometrium: Normal in thickness and appearance, measuring 10 mm.  Right ovary: Measures 6.7 x 2.9 x 3.4 cm and is notable for a  dominant cyst/follicle measuring 3.7 x 2.1 x 2.9 cm.  Left ovary: Not definitely visualized.  Other findings: 7.0 x 6.0 x 5.4 cm complex cystic lesion in the  midline pelvis with associated mural nodule (image 66). No free  fluid.  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 OB-GYN consultation is  advised. If further imaging is desired, consider MRI pelvis  with/without contrast on an outpatient basis.  2.4 cm submucosal fibroid.  Dominant 3.7 cm right ovarian cyst / follicle.  Original Report Authenticated By: Charline Bills, M.D.   MAU Course  Procedures None  MDM After patient had returned from [name]. We were discussing the plan for follow-up care and she states that she is a current patient of [facility]. She had been signed in previously as unassigned.  I called report to Dr. Ernestina Penna and she will assume care at this time.    Assessment and Plan    Freddi Starr, PA-C  12/10/2012, 7:41 PM

## 2012-12-10 NOTE — Telephone Encounter (Signed)
Message copied by Verdie Shire on Tue Dec 10, 2012  3:51 PM ------      Message from: Sheliah Hatch      Created: Tue Dec 10, 2012  3:47 PM       Please call pt and have her go to MAU [facility]) for possible ovarian torsion.  She has a large cystic area pressing on her bladder and radiology has recommended STAT pelvic US to get a better look. ------

## 2012-12-10 NOTE — Progress Notes (Signed)
  Subjective:    Patient ID: Michele [name], female    DOB: [birth year], 42 y.o.   MRN: [id number]  HPI abd pain- pain is LLQ extending across lower abdomen.  sxs started Saturday w/ 'discomfort'.  Yesterday had loose stool but went to work.  Yesterday evening was doubled over in pain.  No nausea.  No diarrhea- only 1 loose stool each day.  No fever.  Menstrual irregularity.  LMP Jan, not using contraception.  Hx of ectopic pregnancy 7 yrs ago.  Hx of uterine fibroids.   Review of Systems For ROS see HPI     Objective:   Physical Exam  Vitals reviewed. Constitutional: She is oriented to person, place, and time. She appears well-developed and well-nourished. No distress.  Cardiovascular: Normal rate, regular rhythm, normal heart sounds and intact distal pulses.   Pulmonary/Chest: Effort normal and breath sounds normal. No respiratory distress. She has no wheezes. She has no rales.  Abdominal: Soft. Bowel sounds are normal. She exhibits no distension and no mass. There is tenderness (pain greatest over LLQ and central suprapubic pain.  no pain in RLQ but is able to feel discomfort on L when pushing on R). There is rebound. There is no guarding.  Neurological: She is alert and oriented to person, place, and time.  Skin: Skin is warm and dry.  Psychiatric: She has a normal mood and affect. Her behavior is normal.          Assessment & Plan:

## 2012-12-10 NOTE — Assessment & Plan Note (Signed)
New.  Pt w/ (-) Upreg in office which rules out ectopic.  Given severity of LLQ pain will get CT to r/o possible diverticulitis (no hx of similar), ovarian cyst, or other intraabdominal or pelvic process.  Pt expressed understanding and is in agreement w/ plan.   Results of CT show 7 cm cystic mass pushing on dome of bladder, possibly originating from L ovary.  Also mentioned possibility of torsion in report.  Spoke w/ radiologist who recommended STAT pelvic US.  Due to size of mass and possibility of torsion, recommended pt proceed directly to MAU for evaluation and tx.  Spoke w/ charge nurse at MAU who is aware of situation and is expecting pt.

## 2012-12-10 NOTE — MAU Note (Signed)
Pt states pain began Saturday and has become progressively worse. Pain moreso on left side of her lower abdomen. Denies abnormal vaginal discharge or bleeding.

## 2012-12-10 NOTE — Telephone Encounter (Signed)
Spoke with the Michele [name] and informed her of the recent CT results, and note and recommendation below by Dr. Beverely Low. Informed the Michele [name] that Dr. Beverely Low called MAU and made them aware that she is coming and they are waiting for her.  Michele [name] understood and agreed.  Informed her she may need someone to go with her, if she don't I asked her to please be careful.  Michele [name] agreed and stated that she will head to MAU.//AB/CMA

## 2012-12-10 NOTE — Patient Instructions (Addendum)
We'll notify you of your labs and CT results and determine the next steps Once we have an answer, we'll call you in the appropriate meds Call with any questions or concerns Hang in there!!!

## 2012-12-13 ENCOUNTER — Encounter: Payer: Self-pay | Admitting: *Deleted

## 2012-12-18 ENCOUNTER — Ambulatory Visit (HOSPITAL_BASED_OUTPATIENT_CLINIC_OR_DEPARTMENT_OTHER): Payer: BC Managed Care – PPO | Admitting: Medical

## 2012-12-18 ENCOUNTER — Ambulatory Visit (HOSPITAL_BASED_OUTPATIENT_CLINIC_OR_DEPARTMENT_OTHER): Payer: BC Managed Care – PPO | Admitting: Lab

## 2012-12-18 VITALS — BP 131/82 | HR 86 | Temp 97.5°F | Resp 16 | Ht 64.0 in | Wt 182.0 lb

## 2012-12-18 DIAGNOSIS — D509 Iron deficiency anemia, unspecified: Secondary | ICD-10-CM

## 2012-12-18 DIAGNOSIS — D508 Other iron deficiency anemias: Secondary | ICD-10-CM

## 2012-12-18 DIAGNOSIS — R7309 Other abnormal glucose: Secondary | ICD-10-CM

## 2012-12-18 DIAGNOSIS — R1909 Other intra-abdominal and pelvic swelling, mass and lump: Secondary | ICD-10-CM

## 2012-12-18 LAB — CBC WITH DIFFERENTIAL (CANCER CENTER ONLY)
BASO#: 0 10*3/uL (ref 0.0–0.2)
BASO%: 0.1 % (ref 0.0–2.0)
Eosinophils Absolute: 0.1 10*3/uL (ref 0.0–0.5)
HCT: 36.9 % (ref 34.8–46.6)
HGB: 13.1 g/dL (ref 11.6–15.9)
LYMPH%: 25.4 % (ref 14.0–48.0)
MCH: 30.2 pg (ref 26.0–34.0)
MCV: 85 fL (ref 81–101)
MONO#: 0.6 10*3/uL (ref 0.1–0.9)
NEUT%: 66.3 % (ref 39.6–80.0)
Platelets: 208 10*3/uL (ref 145–400)
RBC: 4.34 10*6/uL (ref 3.70–5.32)
WBC: 8.4 10*3/uL (ref 3.9–10.0)

## 2012-12-18 LAB — CHCC SATELLITE - SMEAR

## 2012-12-18 LAB — IRON AND TIBC
Iron: 68 ug/dL (ref 42–145)
UIBC: 199 ug/dL (ref 125–400)

## 2012-12-18 NOTE — Progress Notes (Signed)
Diagnoses: #1 recurrent iron deficiency anemia.  Current therapy: IV iron as indicated.  Last dose of IV her was 08/30/2012  Interim history:[name] presents today for an office followup visit.  Since her last visit here, she, states, that she was found to have a 7 cm complex cystic mass in the midline pelvis of probable ovarian origin.  By ultrasound images this cystic mass grossly appears, simple, with single separation, with mural nodularity.  No separate left, ovaries seen.  In addition, she was also found to have an enlarged right ovary, with a 3.7 cm, simple cyst, versus follicle.  She remains on metformin for prediabetes.  She does followup with an endocrinologist.  She does see Dr. Allena Katz, her endocrinologist for thyroid nodules.  She still continues to report, chronic fatigue.  Of note, she, reports, that she's not had a cycle since this past April.  She did followup with her gynecologist, who checked hormone levels, and she was told that she is postmenopausal.  The last, time, she received IV iron here was back in December.  Her iron studies.  At that time revealed a ferritin of 135.  Iron 77, with 25% saturation. She does not report, that she's craving ice.  She reports, she has a good appetite.  She denies any nausea, vomiting, diarrhea, constipation, any chest pain, shortness of breath, or cough.  She denies any fevers, chills, or night sweats.  Denies any palpable adenopathy.  She denies any obvious, or abnormal bleeding.  She denies any headaches, visual changes, or rashes.  Of note, she does have multiple medical issues.  Review of Systems: Constitutional:Negative for malaise/fatigue, fever, chills, weight loss, diaphoresis, activity change, appetite change, and unexpected weight change.  HEENT: Negative for double vision, blurred vision, visual loss, ear pain, tinnitus, congestion, rhinorrhea, epistaxis sore throat or sinus disease, oral pain/lesion, tongue soreness Respiratory:  Negative for cough, chest tightness, shortness of breath, wheezing and stridor.  Cardiovascular: Negative for chest pain, palpitations, leg swelling, orthopnea, PND, DOE or claudication Gastrointestinal: Negative for nausea, vomiting, abdominal pain, diarrhea, constipation, blood in stool, melena, hematochezia, abdominal distention, anal bleeding, rectal pain, anorexia and hematemesis.  Genitourinary: Negative for dysuria, frequency, hematuria,  Musculoskeletal: Negative for myalgias, back pain, joint swelling, arthralgias and gait problem.  Skin: Negative for rash, color change, pallor and wound.  Neurological:. Negative for dizziness/light-headedness, tremors, seizures, syncope, facial asymmetry, speech difficulty, weakness, numbness, headaches and paresthesias.  Hematological: Negative for adenopathy. Does not bruise/bleed easily.  Psychiatric/Behavioral:  Negative for depression, no loss of interest in normal activity or change in sleep pattern.   Physical Exam: This is a pleasant, 42 year old, African American, female, in no obvious distress Vitals: Temperature 97.5 degrees, pulse 86, respirations 16, blood pressure 131/82.  Weight 182 pounds HEENT reveals a normocephalic, atraumatic skull, no scleral icterus, no oral lesions  Neck is supple without any cervical or supraclavicular adenopathy.  Lungs are clear to auscultation bilaterally. There are no wheezes, rales or rhonci Cardiac is regular rate and rhythm with a normal S1 and S2. There are no murmurs, rubs, or bruits.  Abdomen is soft with good bowel sounds, there is no palpable mass. There is no palpable hepatosplenomegaly. There is no palpable fluid wave.  Musculoskeletal no tenderness of the spine, ribs, or hips.  Extremities there are no clubbing, cyanosis, or edema.  Skin no petechia, purpura or ecchymosis Neurologic is nonfocal.  Laboratory Data: White count 8.4, hemoglobin 13.1, hematocrit 36.9, platelets 208,000  Current  Outpatient Prescriptions on  File Prior to Visit  Medication Sig Dispense Refill  . acetaminophen (TYLENOL) 500 MG tablet Take 1,000-1,500 mg by mouth every 6 (six) hours as needed. For headache      . ALPRAZolam (XANAX) 1 MG tablet Take 1 mg by mouth 4 (four) times daily as needed. For anxiety      . BIOTIN PO Take 1 tablet by mouth daily.       Marland Kitchen ibuprofen (ADVIL) 600 MG tablet Take 1 tablet (600 mg total) by mouth every 6 (six) hours as needed for pain.  60 tablet  0  . losartan (COZAAR) 100 MG tablet TAKE ONE TABLET BY MOUTH EVERY DAY  30 tablet  1  . metFORMIN (GLUCOPHAGE) 500 MG tablet Take 250 mg by mouth 2 (two) times daily with a meal.      . Multiple Vitamin (MULTIVITAMIN WITH MINERALS) TABS Take 1 tablet by mouth daily.      Marland Kitchen oxyCODONE-acetaminophen (PERCOCET/ROXICET) 5-325 MG per tablet Take 2 tablets by mouth every 6 (six) hours as needed for pain.  30 tablet  0  . spironolactone (ALDACTONE) 25 MG tablet Take 25 mg by mouth daily.      . [DISCONTINUED] buPROPion (WELLBUTRIN XL) 150 MG 24 hr tablet Take 2 tablets (300 mg total) by mouth daily.  60 tablet  1   No current facility-administered medications on file prior to visit.   Assessment/Plan: This is a pleasant, 42 year old, female, with the following issues:  #1.  Iron deficiency anemia.  Her hemoglobin and MCV looks good today.  We are getting an iron panel on her.  If, indeed, her iron levels are low, we will call her to come in for IV iron.  #2.  7 cm complex left cystic mass in the midline pelvis of probable ovarian origin.  She is following up with her gynecologist regarding this issue.  There is a possibility.  The patient may require a left salpingo-right kidney.  The final decision for surgery.  Will be left for her primary gynecologist.  An ovarian tumor marker is being checked.  #3.  Prediabetes.  She remains on metformin.  #4.  Followup.  We will follow back up with her in 3 months, but before then should there be  questions or concerns.

## 2013-01-03 ENCOUNTER — Ambulatory Visit: Payer: BC Managed Care – PPO | Admitting: Family

## 2013-01-07 ENCOUNTER — Encounter: Payer: Self-pay | Admitting: Cardiovascular Disease

## 2013-01-07 ENCOUNTER — Ambulatory Visit (INDEPENDENT_AMBULATORY_CARE_PROVIDER_SITE_OTHER): Payer: BC Managed Care – PPO | Admitting: Cardiovascular Disease

## 2013-01-07 VITALS — BP 134/88 | HR 87 | Ht 64.0 in | Wt 179.4 lb

## 2013-01-07 DIAGNOSIS — I10 Essential (primary) hypertension: Secondary | ICD-10-CM

## 2013-01-07 NOTE — Patient Instructions (Addendum)
Your physician wants you to follow-up in: 1 YEAR with Dr Cooper.  You will receive a reminder letter in the mail two months in advance. If you don't receive a letter, please call our office to schedule the follow-up appointment.  Your physician recommends that you continue on your current medications as directed. Please refer to the Current Medication list given to you today.  

## 2013-01-07 NOTE — Progress Notes (Signed)
HPI:  42 year-old woman with HTN, palpitations, chest pain. She underwent cardiac cath last year which was normal. She doesn't feel well and has a lot of complaints - generalized soreness, fatigue, palpitations, shortness of breath, abdominal discomfort. She has been diagnosed with an ovarian cyst and is considering treatment options. Fortunately she has started a regular exercise program and is tolerating this well. She has lost a little weight and has been diligent about her exercise. Blood pressure has been better-controlled.   Outpatient Encounter Prescriptions as of 01/07/2013  Medication Sig Dispense Refill  . acetaminophen (TYLENOL) 500 MG tablet Take 1,000-1,500 mg by mouth every 6 (six) hours as needed. For headache      . ALPRAZolam (XANAX) 1 MG tablet Take 1 mg by mouth 4 (four) times daily as needed. For anxiety      . BIOTIN PO Take 1 tablet by mouth daily.       Marland Kitchen ibuprofen (ADVIL) 600 MG tablet Take 1 tablet (600 mg total) by mouth every 6 (six) hours as needed for pain.  60 tablet  0  . losartan (COZAAR) 100 MG tablet TAKE ONE TABLET BY MOUTH EVERY DAY  30 tablet  1  . metFORMIN (GLUCOPHAGE) 500 MG tablet Take 250 mg by mouth 2 (two) times daily with a meal.      . Multiple Vitamin (MULTIVITAMIN WITH MINERALS) TABS Take 1 tablet by mouth daily.      Marland Kitchen oxyCODONE-acetaminophen (PERCOCET/ROXICET) 5-325 MG per tablet Take 2 tablets by mouth every 6 (six) hours as needed for pain.  30 tablet  0  . spironolactone (ALDACTONE) 25 MG tablet Take 25 mg by mouth daily.       No facility-administered encounter medications on file as of 01/07/2013.    Allergies  Allergen Reactions  . Amlodipine Shortness Of Breath and Palpitations  . Wellbutrin (Bupropion Hcl) Other (See Comments)    Dizziness, increased anxiety    Past Medical History  Diagnosis Date  . Depression   . Hypertension   . Anemia     iron deficient, microcytic, hypochromic  . Anxiety   . Migraines   . Positive TB  test 2009    untreated  . Fibroids     uterine  . Palpitations     recurrent  . Fatigue   . Tachycardia     unspecified  . Chest pain     atypical  . 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 Allena Katz at [facility]- S/p FNA March/april of two dominant nodules- benign.  Following for annual thyroid US with Dr. Allena Katz.    . Diabetes mellitus without complication     ROS: Negative except as per HPI  BP 134/88  Pulse 87  Ht 5\' 4"  (1.626 m)  Wt 81.375 kg (179 lb 6.4 oz)  BMI 30.78 kg/m2  SpO2 99%  LMP 09/24/2012  PHYSICAL EXAM: Pt is alert and oriented, NAD HEENT: normal Neck: JVP - normal, carotids 2+= without bruits Lungs: CTA bilaterally CV: RRR without murmur or gallop Abd: soft, NT, Positive BS, no hepatomegaly Ext: no C/C/E, distal pulses intact and equal Skin: warm/dry no rash  EKG:  NSR with sinus arrhythmia 87 bpm.  Cardiac Cath 05/2012: Procedural Findings:  Hemodynamics:  AO 117/72  LV 121/13  Coronary angiography:  Coronary dominance: right  Left mainstem: Angiographically normal  Left anterior descending (LAD): Widely patent to the LV apex, small diagonals without significant disease.  Left circumflex (LCx): Angiographically normal. Small  OM1 and large OM2 are both widely patent.  Right coronary artery (RCA): Moderate caliber, dominant vessel. Supplies a moderate sized PDA and PLA  Left ventriculography: Left ventricular systolic function is normal, LVEF is estimated at 55-65%, there is no significant mitral regurgitation  Final Conclusions:  1. Angiographically normal coronary arteries  2. Normal LV systolic function  3. Normal intracardiac hemodynamics.  Recommendations: Reassurance. No cardiac disease noted.   ASSESSMENT AND PLAN: 1. HTN, essential. BP controlled on current Rx 2. Fatigue/multiple somatic complaints - suspect multifactorial but no clear cardiac cause. 3. Palpitations - overall less than in past. Continue observation,  reassurance.  I think regular exercise is slowly helping her. Encouraged her to continue. Will see her back in one year for follow-up.  Tonny Bollman 01/08/2013 12:06 AM

## 2013-01-08 ENCOUNTER — Ambulatory Visit (HOSPITAL_BASED_OUTPATIENT_CLINIC_OR_DEPARTMENT_OTHER)
Admission: RE | Admit: 2013-01-08 | Discharge: 2013-01-08 | Disposition: A | Discharge status: Home / Self Care | Payer: BC Managed Care – PPO | Source: Ambulatory Visit | Attending: Family | Admitting: Family

## 2013-01-08 DIAGNOSIS — Z1231 Encounter for screening mammogram for malignant neoplasm of breast: Secondary | ICD-10-CM

## 2013-01-21 ENCOUNTER — Ambulatory Visit: Payer: BC Managed Care – PPO | Admitting: Family

## 2013-01-28 ENCOUNTER — Ambulatory Visit (INDEPENDENT_AMBULATORY_CARE_PROVIDER_SITE_OTHER): Payer: BC Managed Care – PPO | Admitting: Family

## 2013-01-28 ENCOUNTER — Encounter: Payer: Self-pay | Admitting: Family

## 2013-01-28 VITALS — BP 110/78 | HR 84 | Temp 97.8°F | Resp 16 | Wt 177.1 lb

## 2013-01-28 DIAGNOSIS — G43909 Migraine, unspecified, not intractable, without status migrainosus: Secondary | ICD-10-CM

## 2013-01-28 DIAGNOSIS — M25529 Pain in unspecified elbow: Secondary | ICD-10-CM

## 2013-01-28 MED ORDER — SUMATRIPTAN SUCCINATE 50 MG PO TABS: ORAL_TABLET | ORAL | Status: DC

## 2013-01-28 MED ORDER — KETOROLAC TROMETHAMINE 30 MG/ML IJ SOLN
30.0000 mg | Freq: Once | INTRAMUSCULAR | Status: AC
  Administered 2013-01-28: 30 mg via INTRAMUSCULAR

## 2013-01-28 NOTE — Assessment & Plan Note (Addendum)
Deteriorated. Refill imitrex, refer to [facility], toradol IM today.

## 2013-01-28 NOTE — Assessment & Plan Note (Signed)
Recommended short course of aleve. Call if symptoms do not improve.

## 2013-01-28 NOTE — Progress Notes (Signed)
Subjective:    Patient ID: Michele [name], female    DOB: 1971-06-21, 42 y.o.   MRN: [id number]  HPI  Pt presents today with several concerns.  1) Headache-Pt reports constant daily headache x 2.5 weeks. She has history of migraine headache.  She reports that she saw Dr. Richardean Chimera in March and received medication through the IV. Reports that she has been keeping a headache log.  Has had HA every week. Headache tends to be on the left lateral head. She has not taken any imitrex.  She was unable to take propranolol due to fatigue and concern about interaction with the metformin.   2) Joint pain- Pt reports constant bilateral elbow pain x 1 month.  Feels now that it is difficult for her to raise her arms.  Types a lot at her job.  She is working on [employer].     Review of Systems See HPI Past Medical History  Diagnosis Date  . Depression   . Hypertension   . Anemia     iron deficient, microcytic, hypochromic  . Anxiety   . Migraines   . 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 Fibroids     uterine  . Palpitations     recurrent  . Fatigue   . Tachycardia     unspecified  . Chest pain     atypical  . 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 Allena Katz at [facility]- S/p FNA March/april of two dominant nodules- benign.  Following for annual thyroid US with Dr. Allena Katz.    . Diabetes mellitus without complication     History   Social History  . Marital Status: Married    Spouse Name: [name]    Number of Children: 2  . Years of Education: N/A   Occupational History  . 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 Smoking status: Never Smoker   . Smokeless tobacco: Never Used  . Alcohol Use: Yes     Comment: Once a month  . Drug Use: No  . Sexually Active: Yes    Birth Control/ Protection: None, Surgical   Other Topics Concern  . Not on file   Social History Narrative   Regular exercise: yes   Daily caffeine: 1-2 daily           Past Surgical History  Procedure Laterality Date  . Appendectomy  03/2009  . Tubal ligation  1997  . Tuboplasty / tubotubal anastomosis  2005  . Biopsy thyroid  November 28, 2009    Dr. Allena Katz- endo  . Dilation and curettage of uterus  05/21/2011    Procedure: DILATATION AND CURETTAGE (D&C);  Surgeon: Robley Fries;  Location: [facility];  Service: Gynecology;  Laterality: N/A;  dilitation and currettage/endometrial currettings    Family History  Problem Relation Age of Onset  . Hypertension Mother   . Cancer Mother 70    breast  . Cancer Father     colon  . Stroke Brother     handicapped due to complications from spinal meningitis  . Seizures Brother   . Asthma Son   . Arthritis Maternal Grandmother   . Hypertension Maternal Grandmother   . Stroke Maternal Grandfather     Allergies  Allergen Reactions  . Amlodipine Shortness Of Breath and Palpitations  . Wellbutrin (Bupropion Hcl) Other (See Comments)    Dizziness, increased anxiety    Current Outpatient Prescriptions on File Prior to  Visit  Medication Sig Dispense Refill  . acetaminophen (TYLENOL) 500 MG tablet Take 1,000-1,500 mg by mouth every 6 (six) hours as needed. For headache      . ALPRAZolam (XANAX) 1 MG tablet Take 1 mg by mouth 4 (four) times daily as needed. For anxiety      . BIOTIN PO Take 1 tablet by mouth daily.       Marland Kitchen ibuprofen (ADVIL) 600 MG tablet Take 1 tablet (600 mg total) by mouth every 6 (six) hours as needed for pain.  60 tablet  0  . losartan (COZAAR) 100 MG tablet TAKE ONE TABLET BY MOUTH EVERY DAY  30 tablet  1  . metFORMIN (GLUCOPHAGE) 500 MG tablet Take 250 mg by mouth daily with breakfast.       . Multiple Vitamin (MULTIVITAMIN WITH MINERALS) TABS Take 1 tablet by mouth daily.      Marland Kitchen oxyCODONE-acetaminophen (PERCOCET/ROXICET) 5-325 MG per tablet Take 2 tablets by mouth every 6 (six) hours as needed for pain.  30 tablet  0  . spironolactone (ALDACTONE) 25 MG tablet Take 25 mg by mouth daily.       . [DISCONTINUED] buPROPion (WELLBUTRIN XL) 150 MG 24 hr tablet Take 2 tablets (300 mg total) by mouth daily.  60 tablet  1   No current facility-administered medications on file prior to visit.    BP 110/78  Pulse 84  Temp(Src) 97.8 F (36.6 C) (Oral)  Resp 16  Wt 177 lb 1.3 oz (80.323 kg)  BMI 30.38 kg/m2  SpO2 98%  LMP 01/08/2013       Objective:   Physical Exam  Constitutional: She is oriented to person, place, and time. She appears well-developed and well-nourished. No distress.  HENT:  Head: Normocephalic and atraumatic.  Cardiovascular: Normal rate and regular rhythm.   No murmur heard. Pulmonary/Chest: Effort normal and breath sounds normal. No respiratory distress. She has no wheezes. She has no rales. She exhibits no tenderness.  Musculoskeletal: She exhibits no edema.  Full ROM bilateral elbows without swelling  Lymphadenopathy:    She has no cervical adenopathy.  Neurological: She is alert and oriented to person, place, and time.  Psychiatric: She has a normal mood and affect. Her behavior is normal. Judgment and thought content normal.          Assessment & Plan:

## 2013-01-28 NOTE — Patient Instructions (Addendum)
You will be contacted about your referral to the headache clinic. Follow up in 3 months, sooner if problems/concerns.

## 2013-01-29 ENCOUNTER — Other Ambulatory Visit: Payer: Self-pay

## 2013-01-29 ENCOUNTER — Emergency Department (HOSPITAL_BASED_OUTPATIENT_CLINIC_OR_DEPARTMENT_OTHER): Payer: BC Managed Care – PPO

## 2013-01-29 ENCOUNTER — Encounter (HOSPITAL_BASED_OUTPATIENT_CLINIC_OR_DEPARTMENT_OTHER): Payer: Self-pay | Admitting: *Deleted

## 2013-01-29 ENCOUNTER — Emergency Department (HOSPITAL_BASED_OUTPATIENT_CLINIC_OR_DEPARTMENT_OTHER)
Admission: EM | Admit: 2013-01-29 | Discharge: 2013-01-29 | Disposition: A | Discharge status: Home / Self Care | Payer: BC Managed Care – PPO | Attending: Emergency Medicine | Admitting: Emergency Medicine

## 2013-01-29 DIAGNOSIS — J3489 Other specified disorders of nose and nasal sinuses: Secondary | ICD-10-CM | POA: Insufficient documentation

## 2013-01-29 DIAGNOSIS — F329 Major depressive disorder, single episode, unspecified: Secondary | ICD-10-CM | POA: Insufficient documentation

## 2013-01-29 DIAGNOSIS — F3289 Other specified depressive episodes: Secondary | ICD-10-CM | POA: Insufficient documentation

## 2013-01-29 DIAGNOSIS — Z9861 Coronary angioplasty status: Secondary | ICD-10-CM | POA: Insufficient documentation

## 2013-01-29 DIAGNOSIS — R110 Nausea: Secondary | ICD-10-CM | POA: Insufficient documentation

## 2013-01-29 DIAGNOSIS — J320 Chronic maxillary sinusitis: Secondary | ICD-10-CM | POA: Insufficient documentation

## 2013-01-29 DIAGNOSIS — Z8611 Personal history of tuberculosis: Secondary | ICD-10-CM | POA: Insufficient documentation

## 2013-01-29 DIAGNOSIS — Z8639 Personal history of other endocrine, nutritional and metabolic disease: Secondary | ICD-10-CM | POA: Insufficient documentation

## 2013-01-29 DIAGNOSIS — Z8679 Personal history of other diseases of the circulatory system: Secondary | ICD-10-CM | POA: Insufficient documentation

## 2013-01-29 DIAGNOSIS — Z8742 Personal history of other diseases of the female genital tract: Secondary | ICD-10-CM | POA: Insufficient documentation

## 2013-01-29 DIAGNOSIS — G43909 Migraine, unspecified, not intractable, without status migrainosus: Secondary | ICD-10-CM | POA: Insufficient documentation

## 2013-01-29 DIAGNOSIS — I10 Essential (primary) hypertension: Secondary | ICD-10-CM | POA: Insufficient documentation

## 2013-01-29 DIAGNOSIS — Z862 Personal history of diseases of the blood and blood-forming organs and certain disorders involving the immune mechanism: Secondary | ICD-10-CM | POA: Insufficient documentation

## 2013-01-29 DIAGNOSIS — R51 Headache: Secondary | ICD-10-CM | POA: Insufficient documentation

## 2013-01-29 DIAGNOSIS — R0789 Other chest pain: Secondary | ICD-10-CM

## 2013-01-29 DIAGNOSIS — R0602 Shortness of breath: Secondary | ICD-10-CM | POA: Insufficient documentation

## 2013-01-29 DIAGNOSIS — F411 Generalized anxiety disorder: Secondary | ICD-10-CM | POA: Insufficient documentation

## 2013-01-29 DIAGNOSIS — E119 Type 2 diabetes mellitus without complications: Secondary | ICD-10-CM | POA: Insufficient documentation

## 2013-01-29 DIAGNOSIS — Z79899 Other long term (current) drug therapy: Secondary | ICD-10-CM | POA: Insufficient documentation

## 2013-01-29 LAB — COMPREHENSIVE METABOLIC PANEL
ALT: 13 U/L (ref 0–35)
AST: 17 U/L (ref 0–37)
Calcium: 9.5 mg/dL (ref 8.4–10.5)
Creatinine, Ser: 0.8 mg/dL (ref 0.50–1.10)
GFR calc Af Amer: >90 mL/min (ref >90)
Glucose, Bld: 93 mg/dL (ref 70–99)
Sodium: 140 mEq/L (ref 135–145)
Total Protein: 6.8 g/dL (ref 6.0–8.3)

## 2013-01-29 LAB — D-DIMER, QUANTITATIVE: D-Dimer, Quant: <0.27 ug/mL-FEU (ref 0.00–0.48)

## 2013-01-29 LAB — CBC WITH DIFFERENTIAL/PLATELET
Basophils Absolute: 0 10*3/uL (ref 0.0–0.1)
Eosinophils Absolute: 0 10*3/uL (ref 0.0–0.7)
Eosinophils Relative: 0 % (ref 0–5)
Hemoglobin: 12.6 g/dL (ref 12.0–15.0)
MCH: 30.9 pg (ref 26.0–34.0)
MCV: 87.7 fL (ref 78.0–100.0)
Platelets: 198 10*3/uL (ref 150–400)
RDW: 12.5 % (ref 11.5–15.5)

## 2013-01-29 MED ORDER — OXYMETAZOLINE HCL 0.05 % NA SOLN: 2.0000 | Freq: Two times a day (BID) | NASAL | Status: DC

## 2013-01-29 MED ORDER — HYDROCODONE-ACETAMINOPHEN 5-325 MG PO TABS
1.0000 | ORAL_TABLET | Freq: Once | ORAL | Status: AC
  Administered 2013-01-29: 1 via ORAL
  Filled 2013-01-29: qty 1

## 2013-01-29 MED ORDER — METOCLOPRAMIDE HCL 10 MG PO TABS
10.0000 mg | ORAL_TABLET | Freq: Once | ORAL | Status: AC
  Administered 2013-01-29: 10 mg via ORAL
  Filled 2013-01-29: qty 1

## 2013-01-29 MED ORDER — IBUPROFEN 400 MG PO TABS
600.0000 mg | ORAL_TABLET | Freq: Once | ORAL | Status: AC
  Administered 2013-01-29: 600 mg via ORAL
  Filled 2013-01-29: qty 1

## 2013-01-29 MED ORDER — FLUTICASONE PROPIONATE 50 MCG/ACT NA SUSP: 2.0000 | Freq: Every day | NASAL | Status: DC

## 2013-01-29 MED ORDER — AZITHROMYCIN 250 MG PO TABS: ORAL_TABLET | ORAL | Status: DC

## 2013-01-29 NOTE — ED Provider Notes (Signed)
History     CSN: [id number]  Arrival date & time 01/29/13  1423   First MD Initiated Contact with Patient 01/29/13 1505      Chief Complaint  Patient presents with  . Chest Pain    (Consider location/radiation/quality/duration/timing/severity/associated sxs/prior treatment) HPI Pt with 1 week of R facial HA, gradual onset unresponsive to pain medications. States she was at work and became anxious and started having chest tightness and SOB, similar to prev anxiety attacks. She took a xanax with minimal effect which made her further anxious. Pt has recent <1 yr cardiac cath with normal coronary arteries. No recent travel or surgery. No fever, chills, cough, nasal congestion. Pt has been seen by PMD for HA and is arranging neuro f/u. Pt denies focal weakness, numbness. +occasional nausea. Has history of migraines but states this HA has lasted longer than in the past.  Past Medical History  Diagnosis Date  . Depression   . Hypertension   . Anemia     iron deficient, microcytic, hypochromic  . Anxiety   . Migraines   . 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 Fibroids     uterine  . Palpitations     recurrent  . Fatigue   . Tachycardia     unspecified  . Chest pain     atypical  . 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 Allena Katz at [facility]- S/p FNA March/april of two dominant nodules- benign.  Following for annual thyroid US with Dr. Allena Katz.    . Diabetes mellitus without complication     Past Surgical History  Procedure Laterality Date  . Appendectomy  03/2009  . Tubal ligation  1997  . Tuboplasty / tubotubal anastomosis  2005  . Biopsy thyroid  November 28, 2009    Dr. Allena Katz- endo  . Dilation and curettage of uterus  05/21/2011    Procedure: DILATATION AND CURETTAGE (D&C);  Surgeon: Robley Fries;  Location: [facility];  Service: Gynecology;  Laterality: N/A;  dilitation and currettage/endometrial currettings    Family History  Problem Relation Age of Onset   . Hypertension Mother   . Cancer Mother 14    breast  . Cancer Father     colon  . Stroke Brother     handicapped due to complications from spinal meningitis  . Seizures Brother   . Asthma Son   . Arthritis Maternal Grandmother   . Hypertension Maternal Grandmother   . Stroke Maternal Grandfather     History  Substance Use Topics  . Smoking status: Never Smoker   . Smokeless tobacco: Never Used  . Alcohol Use: Yes     Comment: Once a month    OB History   Grav Para Term Preterm Abortions TAB SAB Ect Mult Living   4 2 1 1 2 1  1  2       Review of Systems  Constitutional: Negative for fever, chills and fatigue.  HENT: Positive for sinus pressure. Negative for congestion, sore throat, rhinorrhea, neck pain, neck stiffness and postnasal drip.   Eyes: Negative for photophobia.  Respiratory: Positive for chest tightness and shortness of breath. Negative for cough and wheezing.   Cardiovascular: Positive for chest pain. Negative for palpitations and leg swelling.  Gastrointestinal: Positive for nausea. Negative for vomiting, abdominal pain, diarrhea and constipation.  Genitourinary: Negative for dysuria.  Musculoskeletal: Negative for myalgias and back pain.  Skin: Negative for rash and wound.  Neurological: Positive for  headaches. Negative for dizziness, syncope, weakness, light-headedness and numbness.  Psychiatric/Behavioral: The patient is nervous/anxious.   All other systems reviewed and are negative.    Allergies  Amlodipine and Wellbutrin  Home Medications   Current Outpatient Rx  Name  Route  Sig  Dispense  Refill  . acetaminophen (TYLENOL) 500 MG tablet   Oral   Take 1,000-1,500 mg by mouth every 6 (six) hours as needed. For headache         . ALPRAZolam (XANAX) 1 MG tablet   Oral   Take 1 mg by mouth 4 (four) times daily as needed. For anxiety         . azithromycin (ZITHROMAX Z-PAK) 250 MG tablet      2 po day one, then 1 daily x 4 days   5  tablet   0   . BIOTIN PO   Oral   Take 1 tablet by mouth daily.          . fluticasone (FLONASE) 50 MCG/ACT nasal spray   Nasal   Place 2 sprays into the nose daily.   16 g   2   . ibuprofen (ADVIL) 600 MG tablet   Oral   Take 1 tablet (600 mg total) by mouth every 6 (six) hours as needed for pain.   60 tablet   0   . losartan (COZAAR) 100 MG tablet      TAKE ONE TABLET BY MOUTH EVERY DAY   30 tablet   1     .Marland KitchenPatient needs to contact office to schedule  App ...   . metFORMIN (GLUCOPHAGE) 500 MG tablet   Oral   Take 250 mg by mouth daily with breakfast.          . Multiple Vitamin (MULTIVITAMIN WITH MINERALS) TABS   Oral   Take 1 tablet by mouth daily.         Marland Kitchen oxyCODONE-acetaminophen (PERCOCET/ROXICET) 5-325 MG per tablet   Oral   Take 2 tablets by mouth every 6 (six) hours as needed for pain.   30 tablet   0   . oxymetazoline (AFRIN NASAL SPRAY) 0.05 % nasal spray   Nasal   Place 2 sprays into the nose 2 (two) times daily.   30 mL   0   . spironolactone (ALDACTONE) 25 MG tablet   Oral   Take 25 mg by mouth daily.         . SUMAtriptan (IMITREX) 50 MG tablet      One tablet by mouth at start of migraine, may repeat in 2 hours as needed.  Max 2 tabs/24 hrs   10 tablet   2     BP 138/91  Pulse 111  Temp(Src) 98.5 F (36.9 C) (Oral)  Resp 18  Ht 5\' 4"  (1.626 m)  Wt 177 lb (80.287 kg)  BMI 30.37 kg/m2  SpO2 100%  LMP 01/08/2013  Physical Exam  Nursing note and vitals reviewed. Constitutional: She is oriented to person, place, and time. She appears well-developed and well-nourished. No distress.  HENT:  Head: Normocephalic and atraumatic.  Mouth/Throat: Oropharynx is clear and moist. No oropharyngeal exudate.  Tenderness to percussion over R maxillary sinus. +R>L nasal turbinate edema   Eyes: EOM are normal. Pupils are equal, round, and reactive to light.  Neck: Normal range of motion. Neck supple.  no meningismus   Cardiovascular:  Normal rate and regular rhythm.  Exam reveals no gallop and no friction rub.   No  murmur heard. Pulmonary/Chest: Effort normal and breath sounds normal. No respiratory distress. She has no wheezes. She has no rales. She exhibits no tenderness.  Abdominal: Soft. Bowel sounds are normal. She exhibits no distension and no mass. There is no tenderness. There is no rebound and no guarding.  Musculoskeletal: Normal range of motion. She exhibits no edema and no tenderness.  No calf swelling or tenderness  Neurological: She is alert and oriented to person, place, and time.  5/5 motor in all ext, sensation intact, finger to nose intact  Skin: Skin is warm and dry. No rash noted. No erythema.  Psychiatric:  Mildly anxious    ED Course  Procedures (including critical care time)  Labs Reviewed  CBC WITH DIFFERENTIAL - Abnormal; Notable for the following:    HCT 35.8 (*)    All other components within normal limits  COMPREHENSIVE METABOLIC PANEL - Abnormal; Notable for the following:    GFR calc non Af Amer 89 (*)    All other components within normal limits  TROPONIN I  D-DIMER, QUANTITATIVE   Dg Chest 2 View  01/29/2013   *RADIOLOGY REPORT*  Clinical Data: Chest pain and tightness.  Diabetes.  CHEST - 2 VIEW  Comparison: 06/21/2012  Findings: The heart size and mediastinal contours are within normal limits.  Both lungs are clear.  The visualized skeletal structures are unremarkable.  IMPRESSION: Negative exam.   Original Report Authenticated By: Signa Kell, M.D.     1. Maxillary sinusitis   2. Atypical chest pain      Date: 01/29/2013  Rate:87  Rhythm: normal sinus rhythm  QRS Axis: normal  Intervals: normal  ST/T Wave abnormalities: normal  Conduction Disutrbances:none  Narrative Interpretation:   Old EKG Reviewed: unchanged   MDM   No red flags for concerning causes for HA. Symptoms and exam consistent with sinusitis. Will treat for such and have f/u with PMD for possible  neuro referal should HA continue. Pt given return precautions.  Doubt CAD given normal EKG and recent clean cath.        Loren Racer, MD 01/29/13 [phone]

## 2013-01-29 NOTE — ED Notes (Signed)
Pt c/o Chest pain while walking during lunch, palpations and  Facial numbness , hx anxiety, pt took xanax PTA

## 2013-02-13 ENCOUNTER — Telehealth: Payer: Self-pay | Admitting: Pulmonary Disease

## 2013-02-13 DIAGNOSIS — G4733 Obstructive sleep apnea (adult) (pediatric): Secondary | ICD-10-CM

## 2013-02-13 NOTE — Telephone Encounter (Signed)
Order was never sent back in feb 2014. I have sent order ATC pt NA wcb

## 2013-02-14 NOTE — Telephone Encounter (Signed)
ATC pt NA-mailbox is full and not able to leave VM wcb

## 2013-02-17 NOTE — Telephone Encounter (Signed)
Pt advised order sent to [facility]. Carron Curie, CMA

## 2013-02-21 ENCOUNTER — Ambulatory Visit (INDEPENDENT_AMBULATORY_CARE_PROVIDER_SITE_OTHER): Payer: BC Managed Care – PPO | Admitting: Family

## 2013-02-21 ENCOUNTER — Encounter: Payer: Self-pay | Admitting: Family

## 2013-02-21 VITALS — BP 110/78 | HR 73 | Temp 98.6°F | Resp 16 | Wt 169.1 lb

## 2013-02-21 DIAGNOSIS — R202 Paresthesia of skin: Secondary | ICD-10-CM

## 2013-02-21 DIAGNOSIS — R209 Unspecified disturbances of skin sensation: Secondary | ICD-10-CM

## 2013-02-21 DIAGNOSIS — H539 Unspecified visual disturbance: Secondary | ICD-10-CM

## 2013-02-21 DIAGNOSIS — G43909 Migraine, unspecified, not intractable, without status migrainosus: Secondary | ICD-10-CM

## 2013-02-21 NOTE — Assessment & Plan Note (Signed)
I think that her vision issues and facial numbness are most likely related to migraine.  However, I will refer to opthalmology for further evaluation as well as back to Dr. Richardean Chimera for reevaluation of her facial paresthesia.  Obtain b12 and folate.  Will also obtain MRI of the brain to exclude optic nerve compressive lesion.

## 2013-02-21 NOTE — Patient Instructions (Addendum)
Please complete your lab work prior to leaving. You will be contacted about your referral to opthalmology, neurology and for MRI.  Please let us know if you have not heard back within 1 week about your referral. Follow up in 3 months, sooner if problems/concerns.

## 2013-02-21 NOTE — Progress Notes (Signed)
Subjective:    Patient ID: Michele [name], female    DOB: [birth year], 42 y.o.   MRN: [id number]  HPI  [name] is a 42 yr old female who presents today with multiple somatic complaints.  HA wellness- 6/4 topamax which is being titrated upward and baclofen.  Reports that she had blood work that day and was told that her sugar was 50 that day and it was taken in the afternoon. She then called her endocrinologist and told that perhaps she has reactive hypoglycemia.  She reports that she does not eat a lot of carbohydrates.    She reports that Dr. Juliene Pina was prescribing metformin.  She stopped metformin as last A1C was 5.    Vision changes- reports in that in the last 3 months she has had vision issues. She reports that one day she developed "jagged lines" in her left peripheral vision.  She reports that the left peripheral vision remains diminished.  She saw Learta Codding optometrist at the eye center and was prescribed reading glasses.  He felt that she had some drooping of the eyelid.  She reports drowsiness/sleepiness, extreme fatigue, tinnitus.   Reports that about 1 week ago she had sob, numbness, weakness. Was told that she had sinusitis. She was prescribed antibiotic.  She feels like the facial numbness is more centered on the left side of her face.  Feels like left eye droop is worse on let.  Facial numbness has been present x >6 months.    Anxiety- feels that this is well controlled.    Review of Systems See HPI  Past Medical History  Diagnosis Date  . Depression   . Hypertension   . Anemia     iron deficient, microcytic, hypochromic  . Anxiety   . Migraines   . 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 Fibroids     uterine  . Palpitations     recurrent  . Fatigue   . Tachycardia     unspecified  . Chest pain     atypical  . 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 Allena Katz at [facility]- S/p FNA March/april of two dominant nodules- benign.   Following for annual thyroid US with Dr. Allena Katz.    . Diabetes mellitus without complication     History   Social History  . Marital Status: Married    Spouse Name: [name]    Number of Children: 2  . Years of Education: N/A   Occupational History  . 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 Smoking status: Never Smoker   . Smokeless tobacco: Never Used  . Alcohol Use: Yes     Comment: Once a month  . Drug Use: No  . Sexually Active: Yes    Birth Control/ Protection: None, Surgical   Other Topics Concern  . Not on file   Social History Narrative   Regular exercise: yes   Daily caffeine: 1-2 daily          Past Surgical History  Procedure Laterality Date  . Appendectomy  03/2009  . Tubal ligation  1997  . Tuboplasty / tubotubal anastomosis  2005  . Biopsy thyroid  November 28, 2009    Dr. Allena Katz- endo  . Dilation and curettage of uterus  05/21/2011    Procedure: DILATATION AND CURETTAGE (D&C);  Surgeon: Robley Fries;  Location: [facility];  Service: Gynecology;  Laterality: N/A;  dilitation and currettage/endometrial  currettings    Family History  Problem Relation Age of Onset  . Hypertension Mother   . Cancer Mother 11    breast  . Cancer Father     colon  . Stroke Brother     handicapped due to complications from spinal meningitis  . Seizures Brother   . Asthma Son   . Arthritis Maternal Grandmother   . Hypertension Maternal Grandmother   . Stroke Maternal Grandfather     Allergies  Allergen Reactions  . Amlodipine Shortness Of Breath and Palpitations  . Wellbutrin (Bupropion Hcl) Other (See Comments)    Dizziness, increased anxiety    Current Outpatient Prescriptions on File Prior to Visit  Medication Sig Dispense Refill  . acetaminophen (TYLENOL) 500 MG tablet Take 1,000-1,500 mg by mouth every 6 (six) hours as needed. For headache      . ALPRAZolam (XANAX) 1 MG tablet Take 1 mg by mouth 4 (four) times daily as needed. For anxiety      .  BIOTIN PO Take 1 tablet by mouth daily.       . fluticasone (FLONASE) 50 MCG/ACT nasal spray Place 2 sprays into the nose daily.  16 g  2  . ibuprofen (ADVIL) 600 MG tablet Take 1 tablet (600 mg total) by mouth every 6 (six) hours as needed for pain.  60 tablet  0  . losartan (COZAAR) 100 MG tablet TAKE ONE TABLET BY MOUTH EVERY DAY  30 tablet  1  . metFORMIN (GLUCOPHAGE) 500 MG tablet Take 250 mg by mouth daily with breakfast.       . Multiple Vitamin (MULTIVITAMIN WITH MINERALS) TABS Take 1 tablet by mouth daily.      Marland Kitchen oxymetazoline (AFRIN NASAL SPRAY) 0.05 % nasal spray Place 2 sprays into the nose 2 (two) times daily.  30 mL  0  . spironolactone (ALDACTONE) 25 MG tablet Take 25 mg by mouth daily.      . [DISCONTINUED] buPROPion (WELLBUTRIN XL) 150 MG 24 hr tablet Take 2 tablets (300 mg total) by mouth daily.  60 tablet  1   No current facility-administered medications on file prior to visit.    BP 110/78  Pulse 73  Temp(Src) 98.6 F (37 C) (Oral)  Resp 16  Wt 169 lb 1.3 oz (76.694 kg)  BMI 29.01 kg/m2  SpO2 98%  LMP 12/24/2012        Objective:   Physical Exam  Gen: awake, alert, NAD CV:S1/S2 RRR Resp BS CTA bilaterally no wheeze rales rhonchi Extrem: awake, alert, NAD Neuro: A and O x 3, MAE,  + facial symmetry with exception of very mild drooping of the left uppper eyelid.        Assessment & Plan:

## 2013-02-22 LAB — VITAMIN B12: Vitamin B-12: 518 pg/mL (ref 211–911)

## 2013-02-22 LAB — FOLATE: Folate: 13.5 ng/mL

## 2013-02-24 ENCOUNTER — Telehealth: Payer: Self-pay | Admitting: *Deleted

## 2013-02-24 NOTE — Telephone Encounter (Signed)
Spoke with patient and advised results She will bring paperwork by

## 2013-02-24 NOTE — Telephone Encounter (Signed)
OK- I will fill FMLA. She needs to bring [name] her paperwork please.

## 2013-02-24 NOTE — Telephone Encounter (Signed)
Pt calling stating that she was seen at the Headache center on 02/12/13 by Dr. Neale Burly, he recommends nerve block appts and nerve conduction appts for upper and lower. Pt states she has missed a day and half of work and they are suggesting she get FMLA paperwork filled out, pt states the headache center can not fill out the paperwork because she is not an established pt, they suggested she contact her PCP, per pt after she has become established with the headache center they can fill out paperwork after that. Please advise

## 2013-02-26 ENCOUNTER — Encounter: Payer: Self-pay | Admitting: Family

## 2013-02-26 DIAGNOSIS — Z0279 Encounter for issue of other medical certificate: Secondary | ICD-10-CM

## 2013-02-26 NOTE — Telephone Encounter (Signed)
Notified pt gave her mellissa response...lmb

## 2013-02-26 NOTE — Telephone Encounter (Signed)
Please call pt and let her know that FMLA has been completed and will be left at the front desk.  [place] does charge 20$ for FMLA forms.

## 2013-03-03 ENCOUNTER — Other Ambulatory Visit: Payer: Self-pay | Admitting: Cardiovascular Disease

## 2013-03-04 ENCOUNTER — Ambulatory Visit (HOSPITAL_BASED_OUTPATIENT_CLINIC_OR_DEPARTMENT_OTHER)
Admission: RE | Admit: 2013-03-04 | Discharge: 2013-03-04 | Disposition: A | Discharge status: Home / Self Care | Payer: BC Managed Care – PPO | Source: Ambulatory Visit | Attending: Family | Admitting: Family

## 2013-03-04 DIAGNOSIS — H5450 Low vision, one eye, unspecified eye: Secondary | ICD-10-CM | POA: Insufficient documentation

## 2013-03-04 DIAGNOSIS — H539 Unspecified visual disturbance: Secondary | ICD-10-CM

## 2013-03-04 DIAGNOSIS — R209 Unspecified disturbances of skin sensation: Secondary | ICD-10-CM | POA: Insufficient documentation

## 2013-03-04 DIAGNOSIS — R930 Abnormal findings on diagnostic imaging of skull and head, not elsewhere classified: Secondary | ICD-10-CM | POA: Insufficient documentation

## 2013-03-04 NOTE — Progress Notes (Signed)
Quick Note:  Please relate MRI results . See addendum. CD ______

## 2013-03-05 ENCOUNTER — Telehealth: Payer: Self-pay

## 2013-03-05 ENCOUNTER — Telehealth: Payer: Self-pay | Admitting: Family

## 2013-03-05 NOTE — Telephone Encounter (Signed)
Spoke to patient; gave MRI results. Patient is requesting earlier appt due to the severity of her numbness, particularly her facial numbness. Advsd pt she is scheduled for an OV on 03/26/2013 with Dr. Vickey Huger and last OV note and plan.  She says she is aware, but will not be able to meet this appt because of another doctor appt on the same day and is very concern with numbness increase. Requesting med or ealier appt. Advised would notify Dr. Algis Downs. Pt agreed.

## 2013-03-05 NOTE — Telephone Encounter (Signed)
There is no earlier appointment.

## 2013-03-05 NOTE — Telephone Encounter (Signed)
Message copied by Doree Barthel on Wed Mar 05, 2013 11:39 AM ------      Message from: [facility], CARMEN      Created: Tue Mar 04, 2013  4:40 PM       Please relate MRI results . See addendum. CD ------

## 2013-03-05 NOTE — Telephone Encounter (Signed)
Patient is requesting MRI results from yesterday

## 2013-03-06 ENCOUNTER — Telehealth: Payer: Self-pay | Admitting: Neurology

## 2013-03-06 DIAGNOSIS — G43709 Chronic migraine without aura, not intractable, without status migrainosus: Secondary | ICD-10-CM

## 2013-03-06 DIAGNOSIS — R1319 Other dysphagia: Secondary | ICD-10-CM

## 2013-03-06 DIAGNOSIS — R208 Other disturbances of skin sensation: Secondary | ICD-10-CM

## 2013-03-06 NOTE — Telephone Encounter (Signed)
Notified pt of MRI result. Pt states she has contacted Dr Dohmeier's office as well as she doesn't feel that the facial numbness is related to the migraines. Pt feels she was having these symptoms before she was "dealing" with the migraines. Reports that her face stays numb constantly and she is concerned that something else may be causing the numbness. Please advise

## 2013-03-06 NOTE — Telephone Encounter (Signed)
Called patient and relayed Dr. Vickey Huger will be ordering test for her numbness.  I let the patient know the MRI may take a little  longer then the NCS/EMG because it needs to be approved so give a little time for that . Patient showed understanding .

## 2013-03-06 NOTE — Telephone Encounter (Signed)
Finger tips numb , tingling, swallowing  difficulties.  Tongue numb,  Lips tingling for several month , no burning.  She sees Santiago Glad at [facility] , he started TOPIRAMATE. Had numbness in the face  before starting topiramate. NUMBNESS in a face  Associated  With  left eye pain, Dr Carmon Ginsberg  02-12-13  thought this can be related to migraine.   Dr Evelene Croon  wrote for Xanax.  I have seen this patient last in 2013 , over 12 month ago and found no organic abnormalities on neuro exam. There is no earlier appointment .     Will recommend  NCS and EMG for hands and arm. Neck spine MRI.

## 2013-03-06 NOTE — Telephone Encounter (Signed)
Called patient with a sooner appointment in July.

## 2013-03-06 NOTE — Telephone Encounter (Signed)
Pt aware.

## 2013-03-06 NOTE — Telephone Encounter (Signed)
I would recommend that she keep her upcoming appointment with Dr. Vickey Huger as this is a neurological issue.  She can re-evaluate her at this appointment and see if she can offer any other explanation for her facial numbness.

## 2013-03-06 NOTE — Telephone Encounter (Signed)
Dr. Vickey [name] the patient is very concerned, can not accept appointment in July she will be out town. Patient is having trouble swallowing, numbness in her face, tingling in her hands. Please advise on what the patient  should do until she is able to be seen by you. She understands you don't have any openings but would like to know can you at least send her a medication for some relive for this problem. Or call her so she can explains What's going on to you, migraines has nothing to do with her problem. . Best contact is [phone]

## 2013-03-10 ENCOUNTER — Telehealth: Payer: Self-pay | Admitting: Neurology

## 2013-03-10 ENCOUNTER — Ambulatory Visit (INDEPENDENT_AMBULATORY_CARE_PROVIDER_SITE_OTHER): Payer: BC Managed Care – PPO | Admitting: Neurology

## 2013-03-10 ENCOUNTER — Encounter (INDEPENDENT_AMBULATORY_CARE_PROVIDER_SITE_OTHER): Payer: BC Managed Care – PPO

## 2013-03-10 ENCOUNTER — Telehealth: Payer: Self-pay | Admitting: Pulmonary Disease

## 2013-03-10 DIAGNOSIS — R208 Other disturbances of skin sensation: Secondary | ICD-10-CM

## 2013-03-10 DIAGNOSIS — R202 Paresthesia of skin: Secondary | ICD-10-CM

## 2013-03-10 DIAGNOSIS — Z0289 Encounter for other administrative examinations: Secondary | ICD-10-CM

## 2013-03-10 DIAGNOSIS — R209 Unspecified disturbances of skin sensation: Secondary | ICD-10-CM

## 2013-03-10 NOTE — Telephone Encounter (Signed)
refaxed order to [facility]

## 2013-03-10 NOTE — Telephone Encounter (Signed)
Normal EMG and NCS , please call - we willl refer her to another neurology office as we are unable to identify a neurologic cause for her  problem.  Give message to MO.

## 2013-03-10 NOTE — Procedures (Signed)
History of present illness: 42 years old right-handed [place] American female, with intermittent left face, arm, and leg paresthesia   Nerve conduction study: Bilateral upper  and lower extremity motor strength was normal. Deep tendon reflexes were normal and symmetric sensory examination showed preserved vibratory sensation, pinprick, proprioception.  Electromyography:   Selected needle examination was performed at left upper, lower extremity muscles, left cervical, lumbosacral paraspinal muscles.  Needle examination of left tibialis anterior, tibialis posterior, peroneal longus, medial gastrocnemius, vastus lateralis was normal. There was no spontaneous activity at left lumbosacral paraspinal muscles, left L4, L5, S1.  Needle examination of left pronator teres, biceps, triceps, deltoid, extensor digitorum communis was normal.  There was no spontaneous activity at left cervical paraspinal muscles, left C5, 6 and 7.  In conclusion:  This is a normal study. There is no electrodiagnostic evidence of large fiber peripheral neuropathy, left upper extremity neuropathy, left cervical radiculopathy, or left lumbosacral radiculopathy.

## 2013-03-10 NOTE — Telephone Encounter (Signed)
Order was sent to [facility] 02/13/13. I called to check on this. [facility] did not receive this. Please advise [facility]'s thanks

## 2013-03-11 ENCOUNTER — Telehealth: Payer: Self-pay | Admitting: Neurology

## 2013-03-11 NOTE — Telephone Encounter (Signed)
Called patient gave her normal results, and relayed Dr. Vickey Huger will be referring her to another neurologist. Patient showed understanding.

## 2013-03-11 NOTE — Telephone Encounter (Signed)
patiet needs ASAP neck spine MRI to rule out high spinal cord lesion. She is leaving town next week.Marland KitchenMarland Kitchen

## 2013-03-13 ENCOUNTER — Ambulatory Visit (INDEPENDENT_AMBULATORY_CARE_PROVIDER_SITE_OTHER): Payer: BC Managed Care – PPO

## 2013-03-13 DIAGNOSIS — R1319 Other dysphagia: Secondary | ICD-10-CM

## 2013-03-13 DIAGNOSIS — G43709 Chronic migraine without aura, not intractable, without status migrainosus: Secondary | ICD-10-CM

## 2013-03-13 DIAGNOSIS — Z0289 Encounter for other administrative examinations: Secondary | ICD-10-CM

## 2013-03-13 DIAGNOSIS — R209 Unspecified disturbances of skin sensation: Secondary | ICD-10-CM

## 2013-03-13 DIAGNOSIS — R208 Other disturbances of skin sensation: Secondary | ICD-10-CM

## 2013-03-18 ENCOUNTER — Other Ambulatory Visit (INDEPENDENT_AMBULATORY_CARE_PROVIDER_SITE_OTHER): Payer: Self-pay | Admitting: Otolaryngology

## 2013-03-18 DIAGNOSIS — R1310 Dysphagia, unspecified: Secondary | ICD-10-CM

## 2013-03-18 NOTE — Progress Notes (Signed)
Quick Note:  normal test - nothing explaining her Hand and arm dyseasthesias.  Normal MRI neck 03-17-13 , had normal Brain MRI not long ago- ______

## 2013-03-18 NOTE — Progress Notes (Signed)
Quick Note:  Please relate MRI results . See report - nothing explains her symptoms. we need to refer her to tertiary care neurology.   She has been followed by [facility] ( ) ,   CD ______

## 2013-03-18 NOTE — Progress Notes (Signed)
Quick Note:  Normal test. Send patient a copy, please. ______

## 2013-03-19 ENCOUNTER — Telehealth: Payer: Self-pay | Admitting: *Deleted

## 2013-03-19 ENCOUNTER — Ambulatory Visit (HOSPITAL_BASED_OUTPATIENT_CLINIC_OR_DEPARTMENT_OTHER): Payer: BC Managed Care – PPO | Admitting: Hematology & Oncology

## 2013-03-19 ENCOUNTER — Other Ambulatory Visit (HOSPITAL_BASED_OUTPATIENT_CLINIC_OR_DEPARTMENT_OTHER): Payer: BC Managed Care – PPO | Admitting: Lab

## 2013-03-19 VITALS — BP 118/70 | HR 82 | Temp 97.6°F | Resp 16 | Ht 64.0 in | Wt 168.0 lb

## 2013-03-19 DIAGNOSIS — R634 Abnormal weight loss: Secondary | ICD-10-CM

## 2013-03-19 DIAGNOSIS — R29810 Facial weakness: Secondary | ICD-10-CM

## 2013-03-19 DIAGNOSIS — D508 Other iron deficiency anemias: Secondary | ICD-10-CM

## 2013-03-19 LAB — CBC WITH DIFFERENTIAL (CANCER CENTER ONLY)
BASO#: 0 10*3/uL (ref 0.0–0.2)
BASO%: 0.2 % (ref 0.0–2.0)
EOS%: 0.7 % (ref 0.0–7.0)
HGB: 14.4 g/dL (ref 11.6–15.9)
LYMPH#: 2.2 10*3/uL (ref 0.9–3.3)
MCHC: 34.8 g/dL (ref 32.0–36.0)
MONO%: 6.2 % (ref 0.0–13.0)
NEUT#: 3.2 10*3/uL (ref 1.5–6.5)
Platelets: 185 10*3/uL (ref 145–400)

## 2013-03-19 LAB — IRON AND TIBC CHCC
Iron: 85 ug/dL (ref 41–142)
TIBC: 261 ug/dL (ref 236–444)

## 2013-03-19 LAB — FERRITIN CHCC: Ferritin: 159 ng/ml (ref 9–269)

## 2013-03-19 LAB — TSH CHCC: TSH: 0.925 m(IU)/L (ref 0.308–3.960)

## 2013-03-19 NOTE — Progress Notes (Signed)
Quick Note:  Spoke with patient and relayed results of EMG. Patient understood and had no questions. ______

## 2013-03-19 NOTE — Progress Notes (Signed)
Quick Note:  Normal cervical spine MRI , please call and document call. ______

## 2013-03-19 NOTE — Telephone Encounter (Signed)
Left a message on the pt's work voice mail (vm was in the patient's voice and she stated her name) regarding her recent MRI being within normal limits.  Contact information was given so that she may call with any questions or concerns.

## 2013-03-19 NOTE — Progress Notes (Signed)
Quick Note:  Left a message on the pt's work voice mail (vm was in the patient's voice and she stated her name) regarding her recent MRI being within normal limits. Contact information was given so that she may call with any questions or concerns.   ______

## 2013-03-19 NOTE — Progress Notes (Signed)
This office note has been dictated.

## 2013-03-20 DIAGNOSIS — R208 Other disturbances of skin sensation: Secondary | ICD-10-CM | POA: Insufficient documentation

## 2013-03-20 DIAGNOSIS — R1319 Other dysphagia: Secondary | ICD-10-CM | POA: Insufficient documentation

## 2013-03-20 LAB — COMPREHENSIVE METABOLIC PANEL
BUN: 16 mg/dL (ref 6–23)
CO2: 21 mEq/L (ref 19–32)
Calcium: 9.4 mg/dL (ref 8.4–10.5)
Chloride: 108 mEq/L (ref 96–112)
Creatinine, Ser: 0.88 mg/dL (ref 0.50–1.10)
Glucose, Bld: 94 mg/dL (ref 70–99)
Total Bilirubin: 0.8 mg/dL (ref 0.3–1.2)

## 2013-03-20 LAB — ANA: Anti Nuclear Antibody(ANA): NEGATIVE

## 2013-03-20 NOTE — Progress Notes (Signed)
Quick Note:  Left message for patient that if she would like referral to tertiary care center to let us know and we will make the referral. ______

## 2013-03-21 ENCOUNTER — Encounter: Payer: BC Managed Care – PPO | Admitting: Neurology

## 2013-03-21 NOTE — Progress Notes (Signed)
CC:   Melvyn Novas, M.D. Darryl Nestle, MD Sandford Craze, NP  DIAGNOSIS:  Recurrent iron-deficiency anemia.  CURRENT THERAPY:  IV iron as indicated.  INTERIM HISTORY:  [name] comes in for followup.  She is not feeling well.  She has a lot of issues going on right now.  She last got IV iron back in December.  When we last saw her in April, her ferritin was 388 with iron saturation 25%.  She is going through some issues with some facial numbness.  She has headaches.  She has difficulty with some blurred vision.  We checked her vitamin B12 in June.  It was 518.  She is seeing a neurologist.  She is being worked up for the possibility of multiple sclerosis.  She recently had an MRI of the neck done.  This was pretty much unremarkable.  She did have an MRI of the brain back in June.  This showed some abnormal subcortical foci of white matter changes.  Again, the radiologist was not committal as to what this might suggest.  She has a pelvic mass.  This measures 7 cm.  She is followed by Dr. Lafe Garin of [facility].  She sees her in I think August.  Her appetite is doing okay.  She has had no nausea or vomiting.  She is not chewing ice.  PHYSICAL EXAMINATION:  General:  This is a well-developed, well- nourished black female in no obvious distress.  Vital signs: Temperature of 97.6, pulse 82, respiratory rate 16, blood pressure 118/70.  Weight is 168.  Head and neck:  Normocephalic, atraumatic skull.  There are no ocular or oral lesions.  There are no palpable cervical or supraclavicular lymph nodes.  Lungs:  Clear bilaterally. Cardiac:  Regular rate and rhythm with a normal S1 and S2.  There are no murmurs, rubs or bruits.  Abdomen:  Soft with good bowel sounds.  There is no palpable abdominal mass.  There is no fluid wave.  There is no palpable hepatosplenomegaly.  Extremities:  Show no clubbing, cyanosis or edema.  She has good range motion of her joints.   Neurological: Shows no focal neurological deficits.  LABORATORY STUDIES:  White cell count is 5.8, hemoglobin 14.4, hematocrit 41.4, platelet count is 185.  MCV is 86.  IMPRESSION:  [name] is a very nice 42 year old African American female with history of recurrent iron-deficiency anemia.  I do not think iron deficiency is a problem for her right now.  Again, we are checking her iron stores.  I am not sure what else is going on with her.  I did send off an ANA on her.  I guess a young [place] American female with these neurological issues might be considered for lupus.  I am also sending off a TSH on her.  I feel bad that she is going through all this.  We will plan to get her back to see [name] in a couple more months.    ______________________________ Josph Macho, M.D. PRE/MEDQ  D:  03/19/2013  T:  03/20/2013  Job:  [id number]

## 2013-03-24 ENCOUNTER — Telehealth: Payer: Self-pay | Admitting: Hematology & Oncology

## 2013-03-24 NOTE — Telephone Encounter (Addendum)
Message copied by Cathi Roan on Mon Mar 24, 2013 11:24 AM ------      Message from: Josph Macho      Created: Sat Mar 22, 2013 12:12 PM       Call - thyroid test is normal. Test for lupus is (-). Pete ------  03-24-13  Called and left message on patients cell phone regarding above MD note, advised if any questions to call this office. Lupita Raider LPN

## 2013-03-26 ENCOUNTER — Ambulatory Visit: Payer: Self-pay | Admitting: Neurology

## 2013-03-31 ENCOUNTER — Ambulatory Visit
Admission: RE | Admit: 2013-03-31 | Discharge: 2013-03-31 | Disposition: A | Discharge status: Home / Self Care | Payer: BC Managed Care – PPO | Source: Ambulatory Visit | Attending: Otolaryngology | Admitting: Otolaryngology

## 2013-03-31 DIAGNOSIS — R1310 Dysphagia, unspecified: Secondary | ICD-10-CM

## 2013-03-31 MED ORDER — IOHEXOL 300 MG/ML  SOLN
75.0000 mL | Freq: Once | INTRAMUSCULAR | Status: AC | PRN
  Administered 2013-03-31: 75 mL via INTRAVENOUS

## 2013-04-09 ENCOUNTER — Telehealth: Payer: Self-pay | Admitting: Pulmonary Disease

## 2013-04-09 NOTE — Telephone Encounter (Signed)
Called pt x's 2 to make next ov per recall.  Pt never returned calls to make appt.  Mailed recall letter 04/09/13. Leanora Ivanoff

## 2013-04-16 ENCOUNTER — Institutional Professional Consult (permissible substitution): Payer: BC Managed Care – PPO | Admitting: Neurology

## 2013-04-18 ENCOUNTER — Other Ambulatory Visit: Payer: Self-pay | Admitting: *Deleted

## 2013-04-18 ENCOUNTER — Telehealth: Payer: Self-pay | Admitting: *Deleted

## 2013-04-18 MED ORDER — SPIRONOLACTONE 25 MG PO TABS: 25.0000 mg | ORAL_TABLET | Freq: Every day | ORAL | Status: DC

## 2013-04-18 NOTE — Telephone Encounter (Signed)
Notified pt. 

## 2013-04-18 NOTE — Telephone Encounter (Signed)
Received message from pt stating her nephrologist at [facility] (Dr Kathlene Cote) has retired. She states he told her that he was releasing her from his care since her BP is under control and she could ask her primary for spironolactone refills?  Pt states she took her last one last night and needs refill today. Please advise.

## 2013-04-18 NOTE — Telephone Encounter (Signed)
Refill has been sent.  °

## 2013-05-02 ENCOUNTER — Encounter: Payer: Self-pay | Admitting: Family

## 2013-05-02 ENCOUNTER — Ambulatory Visit (INDEPENDENT_AMBULATORY_CARE_PROVIDER_SITE_OTHER): Payer: BC Managed Care – PPO | Admitting: Family

## 2013-05-02 VITALS — BP 118/82 | HR 92 | Temp 98.0°F | Resp 16 | Ht 64.5 in | Wt 165.1 lb

## 2013-05-02 DIAGNOSIS — R7309 Other abnormal glucose: Secondary | ICD-10-CM

## 2013-05-02 DIAGNOSIS — R202 Paresthesia of skin: Secondary | ICD-10-CM

## 2013-05-02 DIAGNOSIS — F419 Anxiety disorder, unspecified: Secondary | ICD-10-CM

## 2013-05-02 DIAGNOSIS — F411 Generalized anxiety disorder: Secondary | ICD-10-CM

## 2013-05-02 DIAGNOSIS — R635 Abnormal weight gain: Secondary | ICD-10-CM

## 2013-05-02 DIAGNOSIS — R7303 Prediabetes: Secondary | ICD-10-CM

## 2013-05-02 DIAGNOSIS — R21 Rash and other nonspecific skin eruption: Secondary | ICD-10-CM

## 2013-05-02 DIAGNOSIS — R209 Unspecified disturbances of skin sensation: Secondary | ICD-10-CM

## 2013-05-02 DIAGNOSIS — R739 Hyperglycemia, unspecified: Secondary | ICD-10-CM

## 2013-05-02 LAB — HEMOGLOBIN A1C: Mean Plasma Glucose: 120 mg/dL — ABNORMAL HIGH (ref <117)

## 2013-05-02 MED ORDER — FLUCONAZOLE 150 MG PO TABS: ORAL_TABLET | ORAL | Status: DC

## 2013-05-02 MED ORDER — BETAMETHASONE DIPROPIONATE 0.05 % EX CREA: TOPICAL_CREAM | Freq: Two times a day (BID) | CUTANEOUS | Status: DC

## 2013-05-02 NOTE — Patient Instructions (Addendum)
Please complete your lab work prior to leaving. Follow up in 3 months.   

## 2013-05-02 NOTE — Progress Notes (Signed)
Subjective:    Patient ID: Michele [name], female    DOB: 1971/05/17, 42 y.o.   MRN: [id number]  HPI  [name] is a 42 yr old female who presents today for follow up.  1) HTN- Currently maintained on losartan, aldactone. Reports that BP readings at home have been very good.  2) Rash-she continues to have rash around the base of her neck. No significant improvement with otc cortisone.  3) Borderline DM- she is currently off of metformin. Reports last A1C 5.1 in June. She was on metformin at that time.  She has lost 25 pounds with diet and exercise since February.  4) facial paresthesia- reports that she gets tingling on her legs and tingling in her arms.  Dr.Dohmeir has made a referral to [facility]. She is awaiting appointment.   5) Saw Dr. Evelene Croon today who added imipramine.  Reports that her anxiety remains uncontrolled.   Review of Systems See HPI  Past Medical History  Diagnosis Date  . Depression   . Hypertension   . Anemia     iron deficient, microcytic, hypochromic  . Anxiety   . Migraines   . 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 Fibroids     uterine  . Palpitations     recurrent  . Fatigue   . Tachycardia     unspecified  . Chest pain     atypical  . 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 Allena Katz at [facility]- S/p FNA March/april of two dominant nodules- benign.  Following for annual thyroid US with Dr. Allena Katz.    . Diabetes mellitus without complication     History   Social History  . Marital Status: Married    Spouse Name: [name]    Number of Children: 2  . Years of Education: N/A   Occupational History  . 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 Smoking status: Never Smoker   . Smokeless tobacco: Never Used  . Alcohol Use: Yes     Comment: Once a month  . Drug Use: No  . Sexual Activity: Yes    Birth Control/ Protection: None, Surgical   Other Topics Concern  . Not on file   Social History Narrative   Regular exercise: yes   Daily caffeine: 1-2 daily          Past Surgical History  Procedure Laterality Date  . Appendectomy  03/2009  . Tubal ligation  1997  . Tuboplasty / tubotubal anastomosis  2005  . Biopsy thyroid  November 28, 2009    Dr. Allena Katz- endo  . Dilation and curettage of uterus  05/21/2011    Procedure: DILATATION AND CURETTAGE (D&C);  Surgeon: Robley Fries;  Location: [facility];  Service: Gynecology;  Laterality: N/A;  dilitation and currettage/endometrial currettings    Family History  Problem Relation Age of Onset  . Hypertension Mother   . Cancer Mother 42    breast  . Cancer Father     colon  . Stroke Brother     handicapped due to complications from spinal meningitis  . Seizures Brother   . Asthma Son   . Arthritis Maternal Grandmother   . Hypertension Maternal Grandmother   . Stroke Maternal Grandfather     Allergies  Allergen Reactions  . Amlodipine Shortness Of Breath and Palpitations  . Wellbutrin [Bupropion Hcl] Other (See Comments)    Dizziness, increased anxiety    Current Outpatient Prescriptions  on File Prior to Visit  Medication Sig Dispense Refill  . acetaminophen (TYLENOL) 500 MG tablet Take 1,000-1,500 mg by mouth every 6 (six) hours as needed. For headache      . ALPRAZolam (XANAX) 1 MG tablet Take 1 mg by mouth 4 (four) times daily as needed. For anxiety      . baclofen (LIORESAL) 10 MG tablet May take 2 tablets a day for 1-2 days per week.      Marland Kitchen BIOTIN PO Take 1 tablet by mouth daily.       . fluticasone (FLONASE) 50 MCG/ACT nasal spray Place 2 sprays into the nose daily.  16 g  2  . ibuprofen (ADVIL) 600 MG tablet Take 1 tablet (600 mg total) by mouth every 6 (six) hours as needed for pain.  60 tablet  0  . losartan (COZAAR) 100 MG tablet Take 1 tablet (100 mg total) by mouth daily.  30 tablet  5  . Multiple Vitamin (MULTIVITAMIN WITH MINERALS) TABS Take 1 tablet by mouth daily.      Marland Kitchen oxymetazoline (AFRIN NASAL SPRAY) 0.05 % nasal  spray Place 2 sprays into the nose 2 (two) times daily.  30 mL  0  . spironolactone (ALDACTONE) 25 MG tablet Take 1 tablet (25 mg total) by mouth daily.  30 tablet  5  . topiramate (TOPAMAX) 25 MG tablet Take 25 mg by mouth daily. Pt may titrate up to 3 per day.      . metFORMIN (GLUCOPHAGE) 500 MG tablet Take 250 mg by mouth daily with breakfast.       . [DISCONTINUED] buPROPion (WELLBUTRIN XL) 150 MG 24 hr tablet Take 2 tablets (300 mg total) by mouth daily.  60 tablet  1   No current facility-administered medications on file prior to visit.    BP 118/82  Pulse 92  Temp(Src) 98 F (36.7 C) (Oral)  Resp 16  Ht 5' 4.5" (1.638 m)  Wt 165 lb 1.3 oz (74.88 kg)  BMI 27.91 kg/m2  SpO2 97%       Objective:   Physical Exam  Constitutional: She is oriented to person, place, and time. She appears well-developed and well-nourished. No distress.  HENT:  Head: Normocephalic and atraumatic.  Cardiovascular: Normal rate and regular rhythm.   No murmur heard. Pulmonary/Chest: Effort normal and breath sounds normal. No respiratory distress. She has no wheezes. She has no rales. She exhibits no tenderness.  Musculoskeletal: She exhibits no edema.  Neurological: She is alert and oriented to person, place, and time.  Skin:  Raised patchy rash noted at base of neck bilaterally  Psychiatric: She has a normal mood and affect. Her behavior is normal. Judgment and thought content normal.          Assessment & Plan:

## 2013-05-03 ENCOUNTER — Encounter: Payer: Self-pay | Admitting: Family

## 2013-05-03 DIAGNOSIS — R202 Paresthesia of skin: Secondary | ICD-10-CM | POA: Insufficient documentation

## 2013-05-03 DIAGNOSIS — R21 Rash and other nonspecific skin eruption: Secondary | ICD-10-CM | POA: Insufficient documentation

## 2013-05-03 NOTE — Assessment & Plan Note (Signed)
?   Fungal. Trial of betamethasone and diflucan. If no improvement with these measures plan referral to dermatology.

## 2013-05-03 NOTE — Assessment & Plan Note (Signed)
Improved.  Applauded pt for her weight loss.

## 2013-05-03 NOTE — Assessment & Plan Note (Signed)
Pt is awaiting referral to [facility] for further evaluation.

## 2013-05-03 NOTE — Assessment & Plan Note (Addendum)
Uncontrolled. She is following with Dr. Evelene Croon.  Defer management to psychiatry.

## 2013-05-03 NOTE — Assessment & Plan Note (Signed)
Improved. A1C 5.8 off of metformin. Continue off metformin.  Continue diet, exercise, weight loss.

## 2013-05-05 ENCOUNTER — Telehealth: Payer: Self-pay | Admitting: Family

## 2013-05-05 MED ORDER — BETAMETHASONE DIPROPIONATE 0.05 % EX CREA: TOPICAL_CREAM | Freq: Two times a day (BID) | CUTANEOUS | Status: DC

## 2013-05-05 NOTE — Telephone Encounter (Signed)
Patient states that the pharmacy did not receive the prescription for the topical cream. Please resend.

## 2013-05-05 NOTE — Telephone Encounter (Signed)
RX resent

## 2013-05-06 ENCOUNTER — Ambulatory Visit (INDEPENDENT_AMBULATORY_CARE_PROVIDER_SITE_OTHER): Payer: BC Managed Care – PPO | Admitting: Pulmonary Disease

## 2013-05-06 ENCOUNTER — Encounter: Payer: Self-pay | Admitting: Pulmonary Disease

## 2013-05-06 VITALS — BP 126/82 | HR 101 | Temp 98.8°F | Ht 64.5 in | Wt 169.5 lb

## 2013-05-06 DIAGNOSIS — R059 Cough, unspecified: Secondary | ICD-10-CM

## 2013-05-06 DIAGNOSIS — R053 Chronic cough: Secondary | ICD-10-CM

## 2013-05-06 DIAGNOSIS — G4733 Obstructive sleep apnea (adult) (pediatric): Secondary | ICD-10-CM

## 2013-05-06 DIAGNOSIS — R05 Cough: Secondary | ICD-10-CM

## 2013-05-06 MED ORDER — PSEUDOEPHEDRINE HCL ER 120 MG PO TB12: 120.0000 mg | ORAL_TABLET | Freq: Every day | ORAL | Status: DC

## 2013-05-06 NOTE — Progress Notes (Signed)
  Subjective:    Patient ID: Michele [name], female    DOB: 16-Aug-1971, 42 y.o.   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 However she also reports a cough and dyspnea.  Initial OV 4/13  [facility] > mild OSA with AHI 8/h, REM AHI 30/h corrected by CPAP 7 cm  Spirometry nml. States currently using a cpap 5 days a week for about 8 hours a night.  She reported waking up with a feeling of gas pain or choking episodes during sleep. Epworth sleepiness score was 11/24. Bedtime is 10:30 to 11 PM, sleep latency was less than to 10 minutes, 1-2 awakenings without any post void sleep latency, out of bed at 6:30 AM feeling tired and occasional headache with dryness of mouth. She gained 30 pounds in the last 2 years but has lost 18 in the last 3 months. CPAP is set at 7 cm, D[facility].  She has undergone evaluation for chest pain & dyspnea. CT angiogram was negative for pulmonary embolism. Thyroid nodules were noted to be benign an ultrasound. She continues to have almost daily chest pressure- extensive evaluation incl cath was negative - attributed to anxiety, but pt is not happy with this diagnosis  Spirometry did not show any evidence of airway obstruction  Trial of omez x 6 wks did not help much   11/05/2012 1 y FU  Download reviewed 03/22/11 to 12/20/11 - 7 cm is adequate pressure.  pt states cough is gone. Pt states every night she is waking up with a severe headache so she has not been using her cpap for several months. C/o increased fatigue after stopping cpap  .   05/06/2013 86m FU  pt states her pressure for CPAP  (7cm ) feels too strong. She is using nasal pillows  and is a mouth breather so she can't breathe. she is wearing it about 2 nights of the week for abput 3-4 hrs. She tends to take off mask at night. Also c/o cough has come back - this had resolved 72m ago , c/o nasal drip, no breakthrough  heartburn Pt does c/o having daytime fatigue & occasional gasping episodes that wake her up from sleep   Review of Systems neg for any significant sore throat, dysphagia, itching, sneezing, nasal congestion or excess/ purulent secretions, fever, chills, sweats, unintended wt loss, pleuritic or exertional cp, hempoptysis, orthopnea pnd or change in chronic leg swelling. Also denies presyncope, palpitations, heartburn, abdominal pain, nausea, vomiting, diarrhea or change in bowel or urinary habits, dysuria,hematuria, rash, arthralgias, visual complaints, headache, numbness weakness or ataxia.      Objective:   Physical Exam  Gen. Pleasant, well-nourished, in no distress ENT - no lesions, no post nasal drip, enlarged nasal turbinates Neck: No JVD, no thyromegaly, no carotid bruits Lungs: no use of accessory muscles, no dullness to percussion, clear without rales or rhonchi  Cardiovascular: Rhythm regular, heart sounds  normal, no murmurs or gallops, no peripheral edema Musculoskeletal: No deformities, no cyanosis or clubbing        Assessment & Plan:

## 2013-05-06 NOTE — Assessment & Plan Note (Addendum)
Drop pressure to 5 cm Trial of chin strap with nasal pillows Download in 1 month  We had a serious discussion about long term CPAP use. The only reason to treat her are her symptoms of gasping & snoring. She is notvery keen about oral appliance Weight loss encouraged, compliance with goal of at least 4-6 hrs every night is the expectation. Advised against medications with sedative side effects Cautioned against driving when sleepy - understanding that sleepiness will vary on a day to day basis

## 2013-05-06 NOTE — Patient Instructions (Addendum)
Drop pressure to 5 cm Trial of chin strap with nasal pillows Download in 1 month Call for questions For cough - use chlorpheniramine 4mg  at b edtime + sudafed 120 XL daily x 4 weeks

## 2013-05-08 ENCOUNTER — Emergency Department (HOSPITAL_BASED_OUTPATIENT_CLINIC_OR_DEPARTMENT_OTHER)
Admission: EM | Admit: 2013-05-08 | Discharge: 2013-05-08 | Disposition: A | Discharge status: Home / Self Care | Payer: BC Managed Care – PPO | Attending: Emergency Medicine | Admitting: Emergency Medicine

## 2013-05-08 ENCOUNTER — Emergency Department (HOSPITAL_BASED_OUTPATIENT_CLINIC_OR_DEPARTMENT_OTHER): Payer: BC Managed Care – PPO

## 2013-05-08 ENCOUNTER — Encounter (HOSPITAL_BASED_OUTPATIENT_CLINIC_OR_DEPARTMENT_OTHER): Payer: Self-pay | Admitting: Family Medicine

## 2013-05-08 ENCOUNTER — Telehealth: Payer: Self-pay | Admitting: Family

## 2013-05-08 ENCOUNTER — Ambulatory Visit: Payer: BC Managed Care – PPO | Admitting: Physician Assistant

## 2013-05-08 DIAGNOSIS — R220 Localized swelling, mass and lump, head: Secondary | ICD-10-CM

## 2013-05-08 DIAGNOSIS — F329 Major depressive disorder, single episode, unspecified: Secondary | ICD-10-CM | POA: Insufficient documentation

## 2013-05-08 DIAGNOSIS — Z862 Personal history of diseases of the blood and blood-forming organs and certain disorders involving the immune mechanism: Secondary | ICD-10-CM | POA: Insufficient documentation

## 2013-05-08 DIAGNOSIS — I10 Essential (primary) hypertension: Secondary | ICD-10-CM | POA: Insufficient documentation

## 2013-05-08 DIAGNOSIS — Z8679 Personal history of other diseases of the circulatory system: Secondary | ICD-10-CM | POA: Insufficient documentation

## 2013-05-08 DIAGNOSIS — R072 Precordial pain: Secondary | ICD-10-CM | POA: Insufficient documentation

## 2013-05-08 DIAGNOSIS — Z79899 Other long term (current) drug therapy: Secondary | ICD-10-CM | POA: Insufficient documentation

## 2013-05-08 DIAGNOSIS — R1310 Dysphagia, unspecified: Secondary | ICD-10-CM | POA: Insufficient documentation

## 2013-05-08 DIAGNOSIS — F411 Generalized anxiety disorder: Secondary | ICD-10-CM | POA: Insufficient documentation

## 2013-05-08 DIAGNOSIS — Z8639 Personal history of other endocrine, nutritional and metabolic disease: Secondary | ICD-10-CM | POA: Insufficient documentation

## 2013-05-08 DIAGNOSIS — R079 Chest pain, unspecified: Secondary | ICD-10-CM

## 2013-05-08 DIAGNOSIS — E119 Type 2 diabetes mellitus without complications: Secondary | ICD-10-CM | POA: Insufficient documentation

## 2013-05-08 DIAGNOSIS — F3289 Other specified depressive episodes: Secondary | ICD-10-CM | POA: Insufficient documentation

## 2013-05-08 DIAGNOSIS — Z8742 Personal history of other diseases of the female genital tract: Secondary | ICD-10-CM | POA: Insufficient documentation

## 2013-05-08 DIAGNOSIS — Z3202 Encounter for pregnancy test, result negative: Secondary | ICD-10-CM | POA: Insufficient documentation

## 2013-05-08 LAB — BASIC METABOLIC PANEL
GFR calc Af Amer: >90 mL/min (ref >90)
GFR calc non Af Amer: 89 mL/min — ABNORMAL LOW (ref >90)
Glucose, Bld: 115 mg/dL — ABNORMAL HIGH (ref 70–99)
Potassium: 4 mEq/L (ref 3.5–5.1)
Sodium: 139 mEq/L (ref 135–145)

## 2013-05-08 LAB — CBC WITH DIFFERENTIAL/PLATELET
Basophils Relative: 0 % (ref 0–1)
Eosinophils Absolute: 0 10*3/uL (ref 0.0–0.7)
Hemoglobin: 13.2 g/dL (ref 12.0–15.0)
Lymphocytes Relative: 23 % (ref 12–46)
MCHC: 33.9 g/dL (ref 30.0–36.0)
Neutrophils Relative %: 70 % (ref 43–77)
RBC: 4.56 MIL/uL (ref 3.87–5.11)
WBC: 5.1 10*3/uL (ref 4.0–10.5)

## 2013-05-08 LAB — PREGNANCY, URINE: Preg Test, Ur: NEGATIVE

## 2013-05-08 LAB — TROPONIN I: Troponin I: <0.3 ng/mL (ref <0.30)

## 2013-05-08 MED ORDER — ASPIRIN 325 MG PO TABS
325.0000 mg | ORAL_TABLET | Freq: Once | ORAL | Status: AC
  Administered 2013-05-08: 325 mg via ORAL
  Filled 2013-05-08: qty 1

## 2013-05-08 MED ORDER — NITROGLYCERIN 0.4 MG SL SUBL
0.4000 mg | SUBLINGUAL_TABLET | SUBLINGUAL | Status: DC | PRN
  Administered 2013-05-08 (×2): 0.4 mg via SUBLINGUAL
  Filled 2013-05-08: qty 25

## 2013-05-08 MED ORDER — SODIUM CHLORIDE 0.9 % IV BOLUS (SEPSIS)
1000.0000 mL | Freq: Once | INTRAVENOUS | Status: AC
  Administered 2013-05-08: 1000 mL via INTRAVENOUS

## 2013-05-08 NOTE — ED Notes (Signed)
Pt c/o central chest pressure and right shoulder pain since 8am. Pt reports initially feeling like her tongue was swollen and difficulty swallowing. Pt sts that tongue discomfort subsided but arms and legs felt "heavy" and chest pain started following those symptoms. Pt reports negative cath in 2013. Pt denies shob, n/v, dizziness.

## 2013-05-08 NOTE — ED Provider Notes (Signed)
CSN: [id number]     Arrival date & time 05/08/13  4098 History   First MD Initiated Contact with Patient 05/08/13 [phone]     Chief Complaint  Patient presents with  . Chest Pain   (Consider location/radiation/quality/duration/timing/severity/associated sxs/prior Treatment) HPI Comments: Pt reports having sudden sensation of tongue swelling, and difficulty swallowing, then progressing to central CP, and heaviness in her legs.  Tongue did not look swollen in mirror. She had nml heart cath in 2013, has had CP in the past.  No hx of angioedema, no few food exposures or new medication.    Patient is a 42 y.o. female presenting with chest pain. The history is provided by the patient. No language interpreter was used.  Chest Pain Pain location:  Substernal area Pain quality: aching   Pain radiates to:  Does not radiate Pain radiates to the back: no   Pain severity:  Moderate Onset quality:  Sudden Duration:  1 hour Timing:  Constant Progression:  Unchanged Chronicity:  New Context: at rest   Context: not eating   Relieved by:  Nothing Worsened by:  Nothing tried Ineffective treatments:  None tried Associated symptoms: no abdominal pain, no back pain, no cough, no diaphoresis, no dizziness, no fatigue, no fever, no headache, no nausea, no numbness, no palpitations, no shortness of breath, not vomiting and no weakness   Risk factors: hypertension     Past Medical History  Diagnosis Date  . Depression   . Hypertension   . Anemia     iron deficient, microcytic, hypochromic  . Anxiety   . Migraines   . 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 Fibroids     uterine  . Palpitations     recurrent  . Fatigue   . Tachycardia     unspecified  . Chest pain     atypical  . 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 Allena Katz at [facility]- S/p FNA March/april of two dominant nodules- benign.  Following for annual thyroid US with Dr. Allena Katz.    . Diabetes mellitus without  complication    Past Surgical History  Procedure Laterality Date  . Appendectomy  03/2009  . Tubal ligation  1997  . Tuboplasty / tubotubal anastomosis  2005  . Biopsy thyroid  November 28, 2009    Dr. Allena Katz- endo  . Dilation and curettage of uterus  05/21/2011    Procedure: DILATATION AND CURETTAGE (D&C);  Surgeon: Robley Fries;  Location: [facility];  Service: Gynecology;  Laterality: N/A;  dilitation and currettage/endometrial currettings   Family History  Problem Relation Age of Onset  . Hypertension Mother   . Cancer Mother 25    breast  . Cancer Father     colon  . Stroke Brother     handicapped due to complications from spinal meningitis  . Seizures Brother   . Asthma Son   . Arthritis Maternal Grandmother   . Hypertension Maternal Grandmother   . Stroke Maternal Grandfather    History  Substance Use Topics  . Smoking status: Never Smoker   . Smokeless tobacco: Never Used  . Alcohol Use: Yes     Comment: Once a month   OB History   Grav Para Term Preterm Abortions TAB SAB Ect Mult Living   4 2 1 1 2 1  1  2      Review of Systems  Constitutional: Negative for fever, chills, diaphoresis, activity change, appetite change and fatigue.  HENT: Negative for congestion, sore throat, facial swelling, rhinorrhea, neck pain and neck stiffness.   Eyes: Negative for photophobia and discharge.  Respiratory: Negative for cough, chest tightness and shortness of breath.   Cardiovascular: Positive for chest pain. Negative for palpitations and leg swelling.  Gastrointestinal: Negative for nausea, vomiting, abdominal pain and diarrhea.  Endocrine: Negative for polydipsia and polyuria.  Genitourinary: Negative for dysuria, frequency, difficulty urinating and pelvic pain.  Musculoskeletal: Negative for back pain and arthralgias.  Skin: Negative for color change and wound.  Allergic/Immunologic: Negative for immunocompromised state.  Neurological: Negative for dizziness, facial asymmetry,  weakness, numbness and headaches.  Hematological: Does not bruise/bleed easily.  Psychiatric/Behavioral: Negative for confusion and agitation.    Allergies  Amlodipine and Wellbutrin  Home Medications   Current Outpatient Rx  Name  Route  Sig  Dispense  Refill  . acetaminophen (TYLENOL) 500 MG tablet   Oral   Take 1,000-1,500 mg by mouth every 6 (six) hours as needed. For headache         . ALPRAZolam (XANAX) 1 MG tablet   Oral   Take 1 mg by mouth 4 (four) times daily as needed. For anxiety         . baclofen (LIORESAL) 10 MG tablet      May take 2 tablets a day for 1-2 days per week.         . betamethasone dipropionate (DIPROLENE) 0.05 % cream   Topical   Apply topically 2 (two) times daily.   30 g   0   . BIOTIN PO   Oral   Take 1 tablet by mouth daily.          . fluconazole (DIFLUCAN) 150 MG tablet      One tablet by mouth today, may repeat in 1 week   2 tablet   0   . fluticasone (FLONASE) 50 MCG/ACT nasal spray   Nasal   Place 2 sprays into the nose as needed.         Marland Kitchen ibuprofen (ADVIL) 600 MG tablet   Oral   Take 1 tablet (600 mg total) by mouth every 6 (six) hours as needed for pain.   60 tablet   0   . losartan (COZAAR) 100 MG tablet   Oral   Take 1 tablet (100 mg total) by mouth daily.   30 tablet   5   . oxymetazoline (AFRIN) 0.05 % nasal spray   Nasal   Place 2 sprays into the nose as needed.         . pseudoephedrine (SUDAFED) 120 MG 12 hr tablet   Oral   Take 1 tablet (120 mg total) by mouth daily.   30 tablet   0   . spironolactone (ALDACTONE) 25 MG tablet   Oral   Take 1 tablet (25 mg total) by mouth daily.   30 tablet   5   . topiramate (TOPAMAX) 25 MG tablet   Oral   Take 25 mg by mouth daily. Pt may titrate up to 3 per day.          BP 128/72  Pulse 71  Temp(Src) 98 F (36.7 C) (Oral)  Resp 16  SpO2 100% Physical Exam  Constitutional: She is oriented to person, place, and time. She appears  well-developed and well-nourished. No distress.  HENT:  Head: Normocephalic and atraumatic.  Mouth/Throat: No oropharyngeal exudate.  Eyes: Pupils are equal, round, and reactive to light.  Neck: Normal  range of motion. Neck supple.  Cardiovascular: Normal rate, regular rhythm and normal heart sounds.  Exam reveals no gallop and no friction rub.   No murmur heard. Pulmonary/Chest: Effort normal and breath sounds normal. No respiratory distress. She has no wheezes. She has no rales.  Abdominal: Soft. Bowel sounds are normal. She exhibits no distension and no mass. There is no tenderness. There is no rebound and no guarding.  Musculoskeletal: Normal range of motion. She exhibits no edema and no tenderness.  Neurological: She is alert and oriented to person, place, and time. No cranial nerve deficit or sensory deficit. She exhibits normal muscle tone. Coordination normal. GCS eye subscore is 4. GCS verbal subscore is 5. GCS motor subscore is 6.  Skin: Skin is warm and dry.  Psychiatric: She has a normal mood and affect.    ED Course  Procedures (including critical care time) Labs Review Labs Reviewed  BASIC METABOLIC PANEL - Abnormal; Notable for the following:    Glucose, Bld 115 (*)    GFR calc non Af Amer 89 (*)    All other components within normal limits  CBC WITH DIFFERENTIAL  TROPONIN I  PREGNANCY, URINE  TROPONIN I   Imaging Review Dg Chest 2 View  05/08/2013   *RADIOLOGY REPORT*  Clinical Data: Chest pain and shortness of breath.  CHEST - 2 VIEW  Comparison: Chest x-ray of 01/29/2013.  Findings: Lung volumes are normal.  No consolidative airspace disease.  No pleural effusions.  No pneumothorax.  No pulmonary nodule or mass noted.  Pulmonary vasculature and the cardiomediastinal silhouette are within normal limits.  IMPRESSION: 1. No radiographic evidence of acute cardiopulmonary disease.   Original Report Authenticated By: Trudie Reed, M.D.    Date: 05/08/2013  Rate: 83   Rhythm: normal sinus rhythm  QRS Axis: normal  Intervals: normal  ST/T Wave abnormalities: normal  Conduction Disutrbances: none  Narrative Interpretation: unremarkable    MDM   1. Chest pain at rest   2. Tongue swelling    Pt is a 42 y.o. female with Pmhx as above who presents with sudden onset sensation of tongue swelling, CP, and leg heaviness about 8am this morning. No EKG changes, delta trop neg x2. No neuro findings no s/sx of anaphylaxis or angioedema on exam. CXR unremarkable. HEART score of 1, placing pt in low risk for MACE.  PERC negative. Hx of normal cath '13.  Doubt ACS, PE, pna.  I believe she is safe for outpt w/u with PCP.  Return precautions given for new or worsening symptoms including worsening pain, SOB, fever, return of oropharyngeal edema.   1. Chest pain at rest   2. Tongue swelling         Shanna Cisco, MD 05/08/13 2105

## 2013-05-08 NOTE — Telephone Encounter (Signed)
pls arrange an ed follow up in next 1 week.

## 2013-05-08 NOTE — Assessment & Plan Note (Signed)
For cough - use chlorpheniramine 4mg  at b edtime + sudafed 120 XL daily x 4 weeks

## 2013-05-09 ENCOUNTER — Encounter: Payer: Self-pay | Admitting: Family

## 2013-05-09 ENCOUNTER — Ambulatory Visit (INDEPENDENT_AMBULATORY_CARE_PROVIDER_SITE_OTHER): Payer: BC Managed Care – PPO | Admitting: Family

## 2013-05-09 VITALS — BP 120/80 | HR 71 | Temp 98.0°F | Resp 16 | Wt 166.1 lb

## 2013-05-09 DIAGNOSIS — R209 Unspecified disturbances of skin sensation: Secondary | ICD-10-CM

## 2013-05-09 DIAGNOSIS — R220 Localized swelling, mass and lump, head: Secondary | ICD-10-CM

## 2013-05-09 DIAGNOSIS — R09A2 Foreign body sensation, throat: Secondary | ICD-10-CM | POA: Insufficient documentation

## 2013-05-09 DIAGNOSIS — F458 Other somatoform disorders: Secondary | ICD-10-CM

## 2013-05-09 DIAGNOSIS — F449 Dissociative and conversion disorder, unspecified: Secondary | ICD-10-CM

## 2013-05-09 DIAGNOSIS — I10 Essential (primary) hypertension: Secondary | ICD-10-CM

## 2013-05-09 DIAGNOSIS — R202 Paresthesia of skin: Secondary | ICD-10-CM

## 2013-05-09 DIAGNOSIS — F411 Generalized anxiety disorder: Secondary | ICD-10-CM

## 2013-05-09 DIAGNOSIS — R0789 Other chest pain: Secondary | ICD-10-CM | POA: Insufficient documentation

## 2013-05-09 DIAGNOSIS — F419 Anxiety disorder, unspecified: Secondary | ICD-10-CM

## 2013-05-09 DIAGNOSIS — R221 Localized swelling, mass and lump, neck: Secondary | ICD-10-CM

## 2013-05-09 NOTE — Progress Notes (Signed)
Subjective:    Patient ID: Michele [name], female    DOB: [birth year], 42 y.o.   MRN: [id number]  HPI  Michele [name] is a 42 yr old female who presents today for ED follow up.  She was seen yesterday with complaint of chest pain and sensation of tongue/throat swelling. Reports that yesterday she was driving to work. Felt all of a sudden that her throat was closing up.  Felt light headed chest started hurting.  Cardiac markers were negative.  She reports that there was no visible swelling noted.  Just a sensation. She continues to have sensation of throat fullness when she swallows.  The pt has had an extensive cardiac work up under Dr. Tonny Bollman and is felt not to have cardiac disease.   She saw Dr. Christain Sacramento.  She underwent CT neck which did not show any gross abnormalities.  She reports that Dr. Christain Sacramento also scoped her and did not note any throat abnormalities.  In the past she has been on PPI and notes that this did not improve her symptoms.  Facial paresthesias- She has had a negative work up under Dr. Richardean Chimera. Dr. Richardean Chimera tried to get her in to a tertiary center without success as her symptoms were non-organic in nature.  Anxiety- she is followed by Dr. Evelene Croon. She is currently on xanax and imipramine.  Reports that she is very frustrated about how she feels.  She reports that her worst symptoms are chest pain and the sensation that something is in her throat. Feels like she is "at my end."  Took 1 month off of work under [employer] and reports symptoms were unchanged. Just went back to work on 8/1.  Review of Systems See HPI  Past Medical History  Diagnosis Date  . Depression   . Hypertension   . Anemia     iron deficient, microcytic, hypochromic  . Anxiety   . Migraines   . 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 Fibroids     uterine  . Palpitations     recurrent  . Fatigue   . Tachycardia     unspecified  . Chest pain     atypical  . Nontoxic multinodular  goiter 11/01/2010    Follows with Dr. Allena Katz at [facility]- S/p FNA March/april of two dominant nodules- benign.  Following for annual thyroid US with Dr. Allena Katz.    . Diabetes mellitus without complication     History   Social History  . Marital Status: Married    Spouse Name: [name]    Number of Children: 2  . Years of Education: N/A   Occupational History  . 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 Smoking status: Never Smoker   . Smokeless tobacco: Never Used  . Alcohol Use: Yes     Comment: Once a month  . Drug Use: No  . Sexual Activity: Yes    Birth Control/ Protection: None, Surgical   Other Topics Concern  . Not on file   Social History Narrative   Regular exercise: yes   Daily caffeine: 1-2 daily          Past Surgical History  Procedure Laterality Date  . Appendectomy  03/2009  . Tubal ligation  1997  . Tuboplasty / tubotubal anastomosis  2005  . Biopsy thyroid  November 28, 2009    Dr. Allena Katz- endo  . Dilation and curettage of uterus  05/21/2011    Procedure:  DILATATION AND CURETTAGE (D&C);  Surgeon: Robley Fries;  Location: [facility];  Service: Gynecology;  Laterality: N/A;  dilitation and currettage/endometrial currettings    Family History  Problem Relation Age of Onset  . Hypertension Mother   . Cancer Mother 68    breast  . Cancer Father     colon  . Stroke Brother     handicapped due to complications from spinal meningitis  . Seizures Brother   . Asthma Son   . Arthritis Maternal Grandmother   . Hypertension Maternal Grandmother   . Stroke Maternal Grandfather     Allergies  Allergen Reactions  . Amlodipine Shortness Of Breath and Palpitations  . Wellbutrin [Bupropion Hcl] Other (See Comments)    Dizziness, increased anxiety    Current Outpatient Prescriptions on File Prior to Visit  Medication Sig Dispense Refill  . acetaminophen (TYLENOL) 500 MG tablet Take 1,000-1,500 mg by mouth every 6 (six) hours as needed. For  headache      . ALPRAZolam (XANAX) 1 MG tablet Take 1 mg by mouth 4 (four) times daily as needed. For anxiety      . betamethasone dipropionate (DIPROLENE) 0.05 % cream Apply topically 2 (two) times daily.  30 g  0  . BIOTIN PO Take 1 tablet by mouth daily.       . fluconazole (DIFLUCAN) 150 MG tablet One tablet by mouth today, may repeat in 1 week  2 tablet  0  . fluticasone (FLONASE) 50 MCG/ACT nasal spray Place 2 sprays into the nose as needed.      Marland Kitchen ibuprofen (ADVIL) 600 MG tablet Take 1 tablet (600 mg total) by mouth every 6 (six) hours as needed for pain.  60 tablet  0  . losartan (COZAAR) 100 MG tablet Take 1 tablet (100 mg total) by mouth daily.  30 tablet  5  . oxymetazoline (AFRIN) 0.05 % nasal spray Place 2 sprays into the nose as needed.      . pseudoephedrine (SUDAFED) 120 MG 12 hr tablet Take 1 tablet (120 mg total) by mouth daily.  30 tablet  0  . spironolactone (ALDACTONE) 25 MG tablet Take 1 tablet (25 mg total) by mouth daily.  30 tablet  5  . [DISCONTINUED] buPROPion (WELLBUTRIN XL) 150 MG 24 hr tablet Take 2 tablets (300 mg total) by mouth daily.  60 tablet  1   No current facility-administered medications on file prior to visit.    BP 120/80  Pulse 71  Temp(Src) 98 F (36.7 C) (Oral)  Resp 16  Wt 166 lb 1.9 oz (75.352 kg)  BMI 28.08 kg/m2  SpO2 99%       Objective:   Physical Exam  Constitutional: She is oriented to person, place, and time. She appears well-developed and well-nourished. No distress.  HENT:  Head: Normocephalic and atraumatic.  Mouth/Throat: No oropharyngeal exudate, posterior oropharyngeal edema or posterior oropharyngeal erythema.  Cardiovascular: Normal rate and regular rhythm.   No murmur heard. Pulmonary/Chest: Effort normal and breath sounds normal. No respiratory distress. She has no wheezes. She has no rales. She exhibits no tenderness.  Neurological: She is alert and oriented to person, place, and time.  Psychiatric: Her behavior is  normal. Judgment and thought content normal.  tearful          Assessment & Plan:

## 2013-05-09 NOTE — Assessment & Plan Note (Signed)
Likely worsened by anxiety.  Defer anxiety management to psych.

## 2013-05-09 NOTE — Assessment & Plan Note (Signed)
Stable. Continue current meds.   

## 2013-05-09 NOTE — Patient Instructions (Addendum)
You will be contacted about your referral to neurology and the allergist.   Please follow up in 3 months.

## 2013-05-09 NOTE — Assessment & Plan Note (Signed)
Atypical, likely anxiety related.

## 2013-05-09 NOTE — Assessment & Plan Note (Addendum)
Uncontrolled, defer management to Dr. Evelene Croon. She has follow up scheduled.

## 2013-05-09 NOTE — Assessment & Plan Note (Signed)
She desires a second opinion from neurology.  Will refer to Dr. Everlena Cooper.

## 2013-05-09 NOTE — Telephone Encounter (Signed)
Pt was seen today, 05/09/13.

## 2013-05-19 ENCOUNTER — Telehealth: Payer: Self-pay | Admitting: Cardiovascular Disease

## 2013-05-19 NOTE — Telephone Encounter (Signed)
New problem    C/O having a lot of chest pain , fatigue not feeling well.

## 2013-05-19 NOTE — Telephone Encounter (Signed)
Pt C/O of having chest. Pt was in the ED on 05/08/13 with same symptoms. Pt states has been having chest pain on and off, and her PCP recommended for pr to call her cardiologist to be seen. An appointment was made with Tereso Newcomer PA on 06/03/13 at 8:50 AM pt aware.

## 2013-05-20 ENCOUNTER — Telehealth: Payer: Self-pay | Admitting: Hematology & Oncology

## 2013-05-20 NOTE — Telephone Encounter (Signed)
Patient called and cx 05/21/13 and resch for 06/26/13

## 2013-05-21 ENCOUNTER — Ambulatory Visit: Payer: BC Managed Care – PPO | Admitting: Hematology & Oncology

## 2013-05-21 ENCOUNTER — Other Ambulatory Visit: Payer: BC Managed Care – PPO | Admitting: Lab

## 2013-05-22 ENCOUNTER — Encounter: Payer: Self-pay | Admitting: Neurology

## 2013-05-22 ENCOUNTER — Other Ambulatory Visit (HOSPITAL_COMMUNITY): Payer: Self-pay | Admitting: Neurology

## 2013-05-22 ENCOUNTER — Ambulatory Visit (INDEPENDENT_AMBULATORY_CARE_PROVIDER_SITE_OTHER): Payer: BC Managed Care – PPO | Admitting: Neurology

## 2013-05-22 VITALS — BP 132/80 | HR 76 | Temp 98.0°F | Resp 18 | Wt 164.0 lb

## 2013-05-22 DIAGNOSIS — F459 Somatoform disorder, unspecified: Secondary | ICD-10-CM

## 2013-05-22 DIAGNOSIS — F451 Undifferentiated somatoform disorder: Secondary | ICD-10-CM

## 2013-05-22 DIAGNOSIS — R4589 Other symptoms and signs involving emotional state: Secondary | ICD-10-CM

## 2013-05-22 DIAGNOSIS — R1314 Dysphagia, pharyngoesophageal phase: Secondary | ICD-10-CM

## 2013-05-22 DIAGNOSIS — R1310 Dysphagia, unspecified: Secondary | ICD-10-CM

## 2013-05-22 NOTE — Progress Notes (Signed)
[facility] Note - Initial Visit   Date:  May 22, 2013   Michele [name] MRN: [id number] DOB: Jun 05, 1971  Dear Dr Peggyann Juba:  Thank you for your kind referral of Michele [name] for consultation of disturbance of skin sensation. Although her history is well known to you, please allow [name] to reiterate it for the purpose of our medical record. The patient was accompanied to the clinic by husband.   History of Present Illness: Michele [name] is a 42 y.o. year-old right-handed [place] American female with history of depression, hypertension, migraines, tachycardia, and atypical chest pain presenting for evaluation of sensory changes.  Patient was in her usual state of health until 2010 when she had her appendix taken out.  Following this, she says that her blood pressure would go up and down and since 2011, she had had a down hill progression of multitude of symptoms and has seen a number of providers including her PCP, cardiology, endocrinology, nephrology, hematology, ENT, pulmonology, psychiatrist, and Dr. Vickey Huger at [facility].   Additionally, she has been to the emergency department numerous times, even telling me she would go 3 times per month for chest pain.  Her biggest concerns for neurology: 1.  Tingling > numbness of the top of her tongue and the roof of her mouth.  Symptoms are intermittent.  2.  Tingling > numbness of the head (R >L parietal region), as if crystals forming inside her blood vessels, like little "zaps". 3.  Swallowing with sensation of something being lodged in the back of her mouth.  She feels as if she is choking with both solids and liquids.  She was on a baseball game March 2014 and she felt a spasm of her throat, lasting 2-57minutes.  Overtime, it has progressed to constant feeling of choking.    She denies any exacerbating or alleviating factors.  No identifiable triggers.  No preceding infection.  She does not notice symptoms when  she is sleeping.    Her prior neurological workup includes normal MRI brain and EMG of the limbs. She says that despite her extensive workup by her providers, the results come back normal. She feels that her body is in an internal storm.  All symptoms are progressively getting worse and it is affecting her work and personal life.  She endorses a significant amount of stress in her life.  Mood is down and depressed.  Sleep is fragmented because she is gasping for air. Hobbies include: watching son play baseball, walking, and reading.  She does not have any social support.  Electronic medical record, and images have been reviewed where available.   Past Medical History  Diagnosis Date  . Depression   . Hypertension   . Anemia     iron deficient, microcytic, hypochromic  . Anxiety   . Migraines   . 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 Fibroids     uterine  . Palpitations     recurrent  . Fatigue   . Tachycardia     unspecified  . Chest pain     atypical  . 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 Allena Katz at [facility]- S/p FNA March/april of two dominant nodules- benign.  Following for annual thyroid US with Dr. Allena Katz.    . Diabetes mellitus without complication     Past Surgical History  Procedure Laterality Date  . Appendectomy  03/2009  . Tubal ligation  1997  . Tuboplasty / tubotubal anastomosis  2005  . Biopsy thyroid  November 28, 2009    Dr. Allena Katz- endo  . Dilation and curettage of uterus  05/21/2011    Procedure: DILATATION AND CURETTAGE (D&C);  Surgeon: Robley Fries;  Location: [facility];  Service: Gynecology;  Laterality: N/A;  dilitation and currettage/endometrial currettings     Medications:  Current Outpatient Prescriptions on File Prior to Visit  Medication Sig Dispense Refill  . acetaminophen (TYLENOL) 500 MG tablet Take 1,000-1,500 mg by mouth every 6 (six) hours as needed. For headache      . ALPRAZolam (XANAX) 1 MG tablet Take 1 mg by mouth 4  (four) times daily as needed. For anxiety      . betamethasone dipropionate (DIPROLENE) 0.05 % cream Apply topically 2 (two) times daily.  30 g  0  . BIOTIN PO Take 1 tablet by mouth daily.       . fluconazole (DIFLUCAN) 150 MG tablet One tablet by mouth today, may repeat in 1 week  2 tablet  0  . fluticasone (FLONASE) 50 MCG/ACT nasal spray Place 2 sprays into the nose as needed.      Marland Kitchen ibuprofen (ADVIL) 600 MG tablet Take 1 tablet (600 mg total) by mouth every 6 (six) hours as needed for pain.  60 tablet  0  . losartan (COZAAR) 100 MG tablet Take 1 tablet (100 mg total) by mouth daily.  30 tablet  5  . pseudoephedrine (SUDAFED) 120 MG 12 hr tablet Take 1 tablet (120 mg total) by mouth daily.  30 tablet  0  . spironolactone (ALDACTONE) 25 MG tablet Take 1 tablet (25 mg total) by mouth daily.  30 tablet  5  . oxymetazoline (AFRIN) 0.05 % nasal spray Place 2 sprays into the nose as needed.      . [DISCONTINUED] buPROPion (WELLBUTRIN XL) 150 MG 24 hr tablet Take 2 tablets (300 mg total) by mouth daily.  60 tablet  1   No current facility-administered medications on file prior to visit.    Allergies:  Allergies  Allergen Reactions  . Amlodipine Shortness Of Breath and Palpitations  . Wellbutrin [Bupropion Hcl] Other (See Comments)    Dizziness, increased anxiety    Family History: Family History  Problem Relation Age of Onset  . Hypertension Mother   . Cancer Mother 38    breast  . Cancer Father     colon  . Stroke Brother     handicapped due to complications from spinal meningitis  . Seizures Brother   . Asthma Son   . Arthritis Maternal Grandmother   . Hypertension Maternal Grandmother   . Stroke Maternal Grandfather     Social History: History   Social History  . Marital Status: Married    Spouse Name: [name]    Number of Children: 2  . Years of Education: N/A   Occupational History  . 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 Smoking status: Never  Smoker   . Smokeless tobacco: Never Used  . Alcohol Use: Yes     Comment: Once a month  . Drug Use: No  . Sexual Activity: Yes    Birth Control/ Protection: None, Surgical   Other Topics Concern  . 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   EYES: +visual changes or eye pain ENT: No hearing changes.  No history of nose bleeds.   RESPIRATORY: No cough, wheezing +ness of breath.   CARDIOVASCULAR: + chest pain, and palpitations.   GI: + abdominal discomfort,no  blood in stools or black stools.  No recent change in bowel habits.   GU:  No history of incontinence.   MUSCLOSKELETAL: No history of joint pain or swelling.  No myalgias.   SKIN: Negative for lesions, rash, and itching.   HEMATOLOGY/ONCOLOGY: Negative for prolonged bleeding, bruising easily, and swollen nodes.  No history of cancer.   ENDOCRINE: Negative for cold or heat intolerance, polydipsia or goiter.   PSYCH:  No depression +anxiety symptoms.   NEURO: As Above.   Vital Signs:  BP 132/80  Pulse 76  Temp(Src) 98 F (36.7 C)  Resp 18  Wt 164 lb (74.39 kg)  BMI 27.73 kg/m2   General Medical Exam: General:  Well appearing, comfortable.   Eyes/ENT: see cranial nerve examination.   Neck: No masses appreciated.  Full range of motion without tenderness.   Respiratory:  Clear to auscultation, good air entry bilaterally.   Cardiac:  Regular rate and rhythm, no murmur.   GI:  Soft, non-tender, non-distended abdomen.   Back:  No pain to palpation of spinous processes.   Extremities:  No deformities, edema, or skin discoloration.  Skin:  Skin color, texture, turgor normal. No rashes or lesions.  Neurological Exam: MENTAL STATUS including orientation to time, place, person,  recent and remote memory, attention span and concentration, language, and fund of knowledge is normal.  Speech is not dysarthric.  CRANIAL NERVES: II:  No visual field defects.  Unremarkable fundi.   III-IV-VI: Pupils equal round and reactive to light.  Normal conjugate, extra-ocular eye movements in all directions of gaze.  No nystagmus.  No ptosis.   V:  Normal facial sensation.  Jaw jerk is absent .   VII:  Normal facial symmetry and movements.  No pathologic facial reflexes.  VIII:  Normal hearing and vestibular function.   IX-X:  Normal palatal movement.   XI:  Normal shoulder shrug and head rotation.   XII:  Normal tongue strength and range of motion, no deviation or fasciculation.  MOTOR:  No atrophy, fasciculations or abnormal movements.  No pronator drift.  Tone is normal.    Right Upper Extremity:    Left Upper Extremity:    Deltoid  5/5   Deltoid  5/5   Biceps  5/5   Biceps  5/5   Triceps  5/5   Triceps  5/5   Wrist extensors  5/5   Wrist extensors  5/5   Wrist flexors  5/5   Wrist flexors  5/5   Finger extensors  5/5   Finger extensors  5/5   Finger flexors  5/5   Finger flexors  5/5   Dorsal interossei  5/5   Dorsal interossei  5/5   Abductor pollicis  5/5   Abductor pollicis  5/5   Tone (Ashworth scale)  0  Tone (Ashworth scale)  0   Right Lower Extremity:    Left Lower Extremity:    Hip flexors  5/5   Hip flexors  5/5   Hip extensors  5/5   Hip extensors  5/5   Knee flexors  5/5   Knee flexors  5/5   Knee extensors  5/5  Knee extensors  5/5   Dorsiflexors  5/5   Dorsiflexors  5/5   Plantarflexors  5/5   Plantarflexors  5/5   Toe extensors  5/5   Toe extensors  5/5   Toe flexors  5/5   Toe flexors  5/5   Tone (Ashworth scale)  0  Tone (Ashworth scale)  0   MSRs:  Right                                                                 Left brachioradialis 2+  brachioradialis 2+  biceps 2+  biceps 2+  triceps 2+  triceps 2+  patellar 2+  patellar 2+  ankle jerk  2+  ankle jerk 2+  Hoffman no  Hoffman no  plantar response down  plantar response down  No crossed adductors.  Plantar responses are flexor.  No clonus.  SENSORY:  Normal and symmetric perception of light touch, pinprick, vibration, and proprioception.  Romberg's sign absent.  No extinction on double simultaneous stimulation.    COORDINATION/GAIT: Normal finger-to- nose-finger and heel-to-shin.  Intact rapid alternating movements bilaterally.  Able to rise from a chair without using arms.  Gait narrow based and stable. Tandem and stressed gait intact.   Data: MRI brain 02/21/2013:  Nonspecific white matter disease  MRI cervical spine 03/20/2013: No disc herniation, cord compression, or foraminal narrowing. EMG 03/10/2013: Normal study of the left upper and lower extremity Labs:  ANA, RF, TSH, B12  IMPRESSION: Michele [name] is a 42 year old female presenting with multiple complaints including paresthesias of her head, tongue, mouth and sensation of choking.  Her neurological examination is entirely normal and non-focal.  Previous neurological work-up has included MRI brain and EMG which has been normal.  She seems very bothered by her dysphasia so will order a modified barium swallow evaluation. Based on history and exam, I cannot find an organic etiology of her symptoms. I spent an extensive amount of time counseling and reassuring that the likelihood of a neurological disorder, such as multiple sclerosis, is very low and do not recommend additional testing. If her clinical symptoms or exam changes, may consider CSF testing at that time. Given her somatic preoccupation and increased stress in her life, I do feel she would benefit from seeing a therapist for coping mechanisms.    PLAN/RECOMMENDATIONS:  1. Modified barium swallow 2. Consult to psychology for coping mechanism 3. Encourage patient to stay active, participate in relaxing techniques such as medication or tai chi, etc.   4.  Return to clinic in 44-months.  The duration of this appointment visit was 85 minutes of face-to-face time with the patient.  At least 50% of this time was spent in counseling, explanation of diagnosis, planning of further management, and coordination of care.   Thank  for allowing me to participate in patient's care.  If I can answer any additional questions, I would be pleased to do so.    Sincerely,    Brodyn Depuy K. Allena Katz, DO

## 2013-05-22 NOTE — Patient Instructions (Addendum)
Your appointment for the barium swallow is scheduled for Thursday, Sept 18 at 11:30am at [facility].  Please arrive by 11:15am to the [facility] (Entrance A)  Dr. Dawayne Cirri office will call you directly to schedule your appointment.

## 2013-05-28 ENCOUNTER — Other Ambulatory Visit: Payer: Self-pay

## 2013-05-28 DIAGNOSIS — R4589 Other symptoms and signs involving emotional state: Secondary | ICD-10-CM

## 2013-05-29 ENCOUNTER — Ambulatory Visit (HOSPITAL_COMMUNITY): Payer: BC Managed Care – PPO

## 2013-05-29 ENCOUNTER — Inpatient Hospital Stay (HOSPITAL_COMMUNITY): Admission: RE | Admit: 2013-05-29 | Payer: BC Managed Care – PPO | Source: Ambulatory Visit

## 2013-06-03 ENCOUNTER — Ambulatory Visit: Payer: BC Managed Care – PPO | Admitting: Physician Assistant

## 2013-06-05 ENCOUNTER — Ambulatory Visit (HOSPITAL_COMMUNITY)
Admission: RE | Admit: 2013-06-05 | Discharge: 2013-06-05 | Disposition: A | Discharge status: Home / Self Care | Payer: BC Managed Care – PPO | Source: Ambulatory Visit | Attending: Neurology | Admitting: Neurology

## 2013-06-05 ENCOUNTER — Other Ambulatory Visit: Payer: Self-pay | Admitting: Neurology

## 2013-06-05 DIAGNOSIS — R1314 Dysphagia, pharyngoesophageal phase: Secondary | ICD-10-CM

## 2013-06-05 DIAGNOSIS — R1310 Dysphagia, unspecified: Secondary | ICD-10-CM

## 2013-06-05 DIAGNOSIS — K219 Gastro-esophageal reflux disease without esophagitis: Secondary | ICD-10-CM | POA: Insufficient documentation

## 2013-06-05 NOTE — Procedures (Signed)
Objective Swallowing Evaluation: Modified Barium Swallowing Study  Patient Details  Name: Michele [name] MRN: [id number] Date of Birth: 03-Mar-1971  Today's Date: 06/05/2013 Time: 1130-     Past Medical History:  Past Medical History  Diagnosis Date  . Depression   . Hypertension   . Anemia     iron deficient, microcytic, hypochromic  . Anxiety   . Migraines   . 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 Fibroids     uterine  . Palpitations     recurrent  . Fatigue   . Tachycardia     unspecified  . Chest pain     atypical  . 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 Allena Katz at [facility]- S/p FNA March/april of two dominant nodules- benign.  Following for annual thyroid US with Dr. Allena Katz.    . Diabetes mellitus without complication    Past Surgical History:  Past Surgical History  Procedure Laterality Date  . Appendectomy  03/2009  . Tubal ligation  1997  . Tuboplasty / tubotubal anastomosis  2005  . Biopsy thyroid  November 28, 2009    Dr. Allena Katz- endo  . Dilation and curettage of uterus  05/21/2011    Procedure: DILATATION AND CURETTAGE (D&C);  Surgeon: Robley Fries;  Location: [facility];  Service: Gynecology;  Laterality: N/A;  dilitation and currettage/endometrial currettings   HPI:  Michele [name] is a 42 year old female with h/o  depression, hypertension, migraines, tachycardia, and atypical chest pain  (reports going to ED 3x/month) and recent MD visit for paresthesias of her head, tongue, mouth and sensation of choking. Her neurological examination at that time was entirely normal and non-focal. Previous neurological work-up has included MRI brain and EMG which has been normal. Per MD notes, she seemed very bothered by her dysphagia and MBS ordered. Based on his exam and patient history, no organic etiology of her symptoms was identified. MD felt that stress was a contributing factor.      Assessment / Plan / Recommendation Clinical Impression  Dysphagia Diagnosis: Suspected primary esophageal dysphagia Clinical impression: Patient presents with a suspected primary esophageal dysphagia given c/o globus despite an observed normal oropharyngeal swallow with full airway protection and pharyngeal clearance. Patient does endorse recent stress. Question if this is exacerbating known h/o GERD. No further SLP w/u indicated. Defer medication/stress management of symptoms to patient and MD. Education complete with patient regarding results of study, rational for symptoms, and general precautions.     Treatment Recommendation  No treatment recommended at this time    Diet Recommendation Regular;Thin liquid   Liquid Administration via: Cup;Straw Medication Administration: Whole meds with liquid Supervision: Patient able to self feed Compensations: Slow rate;Small sips/bites Postural Changes and/or Swallow Maneuvers: Seated upright 90 degrees;Upright 30-60 min after meal    Other  Recommendations Oral Care Recommendations: Oral care BID   Follow Up Recommendations  None           SLP Swallow Goals     General HPI: Michele [name] is a 42 year old female with h/o  depression, hypertension, migraines, tachycardia, and atypical chest pain  (reports going to ED 3x/month) and recent MD visit for paresthesias of her head, tongue, mouth and sensation of choking. Her neurological examination at that time was entirely normal and non-focal. Previous neurological work-up has included MRI brain and EMG which has been normal. Per MD notes, she seemed very bothered by her dysphagia and MBS ordered. Based on  his exam and patient history, no organic etiology of her symptoms was identified. MD felt that stress was a contributing factor.  Type of Study: Modified Barium Swallowing Study Reason for Referral: Objectively evaluate swallowing function Previous Swallow Assessment: none Diet Prior to this Study: Regular;Thin liquids Temperature Spikes Noted:  No Respiratory Status: Room air History of Recent Intubation: No Behavior/Cognition: Alert;Pleasant mood;Cooperative Oral Cavity - Dentition: Adequate natural dentition Oral Motor / Sensory Function: Within functional limits (except c/o mild tingling and occassional spasms in tongue) Self-Feeding Abilities: Able to feed self Patient Positioning: Upright in chair Baseline Vocal Quality: Clear Volitional Cough: Strong Volitional Swallow: Able to elicit Anatomy: Within functional limits Pharyngeal Secretions: Not observed secondary MBS    Reason for Referral Objectively evaluate swallowing function   Oral Phase Oral Preparation/Oral Phase Oral Phase: WFL   Pharyngeal Phase Pharyngeal Phase Pharyngeal Phase: Within functional limits  Cervical Esophageal Phase    GO    Cervical Esophageal Phase Cervical Esophageal Phase: [facility]    Functional Assessment Tool Used: skilled clinical judgement Functional Limitations: Swallowing Swallow Current Status (J4782): At least 1 percent but less than 20 percent impaired, limited or restricted Swallow Goal Status [phone]): At least 1 percent but less than 20 percent impaired, limited or restricted Swallow Discharge Status [id number]): At least 1 percent but less than 20 percent impaired, limited or restricted   [facility] MA, CCC-SLP [phone]  Ferdinand Lango Meryl 06/05/2013, 11:51 AM

## 2013-06-16 ENCOUNTER — Ambulatory Visit (INDEPENDENT_AMBULATORY_CARE_PROVIDER_SITE_OTHER): Payer: BC Managed Care – PPO | Admitting: Family

## 2013-06-16 ENCOUNTER — Encounter: Payer: Self-pay | Admitting: Family

## 2013-06-16 ENCOUNTER — Telehealth: Payer: Self-pay | Admitting: Cardiovascular Disease

## 2013-06-16 VITALS — BP 130/90 | HR 75 | Temp 98.0°F | Resp 16 | Ht 64.5 in | Wt 164.0 lb

## 2013-06-16 DIAGNOSIS — R252 Cramp and spasm: Secondary | ICD-10-CM

## 2013-06-16 NOTE — Telephone Encounter (Signed)
I will forward this message to Dr Cooper to review and make further recommendations.  

## 2013-06-16 NOTE — Telephone Encounter (Signed)
New Problem  pt went to another provider who precribed additional medication (Imipramine) and she asks if it is ok to take.. please call back to discuss.

## 2013-06-16 NOTE — Progress Notes (Signed)
Subjective:    Patient ID: Michele [name], female    DOB: November 24, 1970, 42 y.o.   MRN: [id number]  HPI  Bilateral leg pain- calves feel like they are in a tight ball.  Started on Friday.  Reports that she had a really heavy cycle last week and since that time she has not had any energy.  Reports some pain behind the left knee.  Denies recent travel.  Plans to drive to [facility] on Thursday.     Review of Systems    see HPI  Past Medical History  Diagnosis Date  . Depression   . Hypertension   . Anemia     iron deficient, microcytic, hypochromic  . Anxiety   . Migraines   . 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 Fibroids     uterine  . Palpitations     recurrent  . Fatigue   . Tachycardia     unspecified  . Chest pain     atypical  . 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 Allena Katz at [facility]- S/p FNA March/april of two dominant nodules- benign.  Following for annual thyroid US with Dr. Allena Katz.    . Diabetes mellitus without complication     History   Social History  . Marital Status: Married    Spouse Name: [name]    Number of Children: 2  . Years of Education: N/A   Occupational History  . 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 Smoking status: Never Smoker   . Smokeless tobacco: Never Used  . Alcohol Use: Yes     Comment: Once a month  . Drug Use: No  . Sexual Activity: Yes    Birth Control/ Protection: None, Surgical   Other Topics Concern  . 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 Appendectomy  03/2009  . Tubal ligation  1997  . Tuboplasty / tubotubal anastomosis  2005  . Biopsy thyroid  November 28, 2009    Dr. Allena Katz- endo  . Dilation and curettage of uterus  05/21/2011    Procedure: DILATATION AND  CURETTAGE (D&C);  Surgeon: Robley Fries;  Location: [facility];  Service: Gynecology;  Laterality: N/A;  dilitation and currettage/endometrial currettings    Family History  Problem Relation Age of Onset  . Hypertension Mother   . Cancer Mother 31    breast  . Cancer Father     colon  . Stroke Brother     handicapped due to complications from spinal meningitis  . Seizures Brother   . Asthma Son   . Arthritis Maternal Grandmother   . Hypertension Maternal Grandmother   . Stroke Maternal Grandfather     Allergies  Allergen Reactions  . Amlodipine Shortness Of Breath and Palpitations  . Wellbutrin [Bupropion Hcl] Other (See Comments)    Dizziness, increased anxiety    Current Outpatient Prescriptions on File Prior to Visit  Medication Sig Dispense Refill  . acetaminophen (TYLENOL) 500 MG tablet Take 1,000-1,500 mg by mouth every 6 (six) hours as needed. For headache      . ALPRAZolam (XANAX) 1 MG tablet Take 1 mg by mouth 4 (four)  times daily as needed. For anxiety      . EPIPEN 2-PAK 0.3 MG/0.3ML SOAJ injection       . fluticasone (FLONASE) 50 MCG/ACT nasal spray Place 2 sprays into the nose as needed.      Marland Kitchen ibuprofen (ADVIL) 600 MG tablet Take 1 tablet (600 mg total) by mouth every 6 (six) hours as needed for pain.  60 tablet  0  . losartan (COZAAR) 100 MG tablet Take 1 tablet (100 mg total) by mouth daily.  30 tablet  5  . oxymetazoline (AFRIN) 0.05 % nasal spray Place 2 sprays into the nose as needed.      Marland Kitchen spironolactone (ALDACTONE) 25 MG tablet Take 1 tablet (25 mg total) by mouth daily.  30 tablet  5  . [DISCONTINUED] buPROPion (WELLBUTRIN XL) 150 MG 24 hr tablet Take 2 tablets (300 mg total) by mouth daily.  60 tablet  1   No current facility-administered medications on file prior to visit.    BP 130/90  Pulse 75  Temp(Src) 98 F (36.7 C) (Oral)  Resp 16  Ht 5' 4.5" (1.638 m)  Wt 164 lb (74.39 kg)  BMI 27.73 kg/m2  SpO2 99%  LMP 06/01/2013    Objective:    Physical Exam  Constitutional: She appears well-developed and well-nourished. No distress.  Cardiovascular: Normal rate and regular rhythm.   No murmur heard. Pulmonary/Chest: Effort normal and breath sounds normal. No respiratory distress. She has no wheezes. She has no rales. She exhibits no tenderness.  Musculoskeletal: She exhibits no edema.  Left calf muscle briefly was ha          Assessment & Plan:

## 2013-06-16 NOTE — Patient Instructions (Addendum)
Please return to the lab for lab work. Drink 64 ounces of water a day. Stretch regularly. Follow up in December as scheduled.

## 2013-06-17 DIAGNOSIS — R252 Cramp and spasm: Secondary | ICD-10-CM | POA: Insufficient documentation

## 2013-06-17 LAB — CBC WITH DIFFERENTIAL/PLATELET
Basophils Absolute: 0 10*3/uL (ref 0.0–0.1)
Basophils Relative: 0 % (ref 0–1)
Hemoglobin: 12.4 g/dL (ref 12.0–15.0)
MCHC: 34 g/dL (ref 30.0–36.0)
Neutro Abs: 3.6 10*3/uL (ref 1.7–7.7)
Neutrophils Relative %: 63 % (ref 43–77)
Platelets: 255 10*3/uL (ref 150–400)
RDW: 14 % (ref 11.5–15.5)

## 2013-06-17 LAB — BASIC METABOLIC PANEL
Potassium: 5.2 mEq/L (ref 3.5–5.3)
Sodium: 138 mEq/L (ref 135–145)

## 2013-06-17 LAB — MAGNESIUM: Magnesium: 2 mg/dL (ref 1.5–2.5)

## 2013-06-17 NOTE — Assessment & Plan Note (Addendum)
Discussed importance of stretching, adequate hydration. Obtain BMET and magnesium to further evaluate electrolyte status. Check CBC due to fatigue after heavy menstrual cycle.

## 2013-06-17 NOTE — Telephone Encounter (Signed)
I spoke with the pt and made her aware of Dr Cooper's comments.  

## 2013-06-17 NOTE — Telephone Encounter (Signed)
Like any medication there are multiple potential cardiac side-effects, but she does not have any specific contraindication to taking imipramine.  thx  Tonny Bollman 06/17/2013 1:23 PM

## 2013-06-18 ENCOUNTER — Ambulatory Visit: Payer: BC Managed Care – PPO | Admitting: Psychology

## 2013-06-25 ENCOUNTER — Encounter (HOSPITAL_BASED_OUTPATIENT_CLINIC_OR_DEPARTMENT_OTHER): Payer: Self-pay | Admitting: Emergency Medicine

## 2013-06-25 DIAGNOSIS — Z79899 Other long term (current) drug therapy: Secondary | ICD-10-CM | POA: Insufficient documentation

## 2013-06-25 DIAGNOSIS — Z8742 Personal history of other diseases of the female genital tract: Secondary | ICD-10-CM | POA: Insufficient documentation

## 2013-06-25 DIAGNOSIS — Z8611 Personal history of tuberculosis: Secondary | ICD-10-CM | POA: Insufficient documentation

## 2013-06-25 DIAGNOSIS — M79609 Pain in unspecified limb: Secondary | ICD-10-CM | POA: Insufficient documentation

## 2013-06-25 DIAGNOSIS — F411 Generalized anxiety disorder: Secondary | ICD-10-CM | POA: Insufficient documentation

## 2013-06-25 DIAGNOSIS — E119 Type 2 diabetes mellitus without complications: Secondary | ICD-10-CM | POA: Insufficient documentation

## 2013-06-25 DIAGNOSIS — I10 Essential (primary) hypertension: Secondary | ICD-10-CM | POA: Insufficient documentation

## 2013-06-25 DIAGNOSIS — F3289 Other specified depressive episodes: Secondary | ICD-10-CM | POA: Insufficient documentation

## 2013-06-25 DIAGNOSIS — F329 Major depressive disorder, single episode, unspecified: Secondary | ICD-10-CM | POA: Insufficient documentation

## 2013-06-25 DIAGNOSIS — Z862 Personal history of diseases of the blood and blood-forming organs and certain disorders involving the immune mechanism: Secondary | ICD-10-CM | POA: Insufficient documentation

## 2013-06-25 NOTE — ED Notes (Signed)
Pt states that she woke from sleep tonight with left lower leg pain. Pt denies SOB or CP, but c/o chest tightness.

## 2013-06-26 ENCOUNTER — Ambulatory Visit (HOSPITAL_BASED_OUTPATIENT_CLINIC_OR_DEPARTMENT_OTHER): Payer: BC Managed Care – PPO | Admitting: Hematology & Oncology

## 2013-06-26 ENCOUNTER — Ambulatory Visit (HOSPITAL_BASED_OUTPATIENT_CLINIC_OR_DEPARTMENT_OTHER)
Admission: RE | Admit: 2013-06-26 | Discharge: 2013-06-26 | Disposition: A | Discharge status: Home / Self Care | Payer: BC Managed Care – PPO | Source: Ambulatory Visit | Attending: Emergency Medicine | Admitting: Emergency Medicine

## 2013-06-26 ENCOUNTER — Other Ambulatory Visit: Payer: Self-pay

## 2013-06-26 ENCOUNTER — Emergency Department (HOSPITAL_BASED_OUTPATIENT_CLINIC_OR_DEPARTMENT_OTHER)
Admission: EM | Admit: 2013-06-26 | Discharge: 2013-06-26 | Disposition: A | Discharge status: Home / Self Care | Payer: BC Managed Care – PPO | Attending: Emergency Medicine | Admitting: Emergency Medicine

## 2013-06-26 ENCOUNTER — Other Ambulatory Visit (HOSPITAL_BASED_OUTPATIENT_CLINIC_OR_DEPARTMENT_OTHER): Payer: BC Managed Care – PPO | Admitting: Lab

## 2013-06-26 VITALS — BP 114/81 | HR 78 | Temp 98.0°F | Resp 14 | Ht 65.0 in | Wt 163.0 lb

## 2013-06-26 DIAGNOSIS — M79609 Pain in unspecified limb: Secondary | ICD-10-CM | POA: Insufficient documentation

## 2013-06-26 DIAGNOSIS — R29810 Facial weakness: Secondary | ICD-10-CM

## 2013-06-26 DIAGNOSIS — D510 Vitamin B12 deficiency anemia due to intrinsic factor deficiency: Secondary | ICD-10-CM

## 2013-06-26 DIAGNOSIS — R634 Abnormal weight loss: Secondary | ICD-10-CM

## 2013-06-26 DIAGNOSIS — D509 Iron deficiency anemia, unspecified: Secondary | ICD-10-CM

## 2013-06-26 DIAGNOSIS — D508 Other iron deficiency anemias: Secondary | ICD-10-CM

## 2013-06-26 DIAGNOSIS — I10 Essential (primary) hypertension: Secondary | ICD-10-CM

## 2013-06-26 DIAGNOSIS — M79604 Pain in right leg: Secondary | ICD-10-CM

## 2013-06-26 LAB — CBC WITH DIFFERENTIAL (CANCER CENTER ONLY)
BASO#: 0 10*3/uL (ref 0.0–0.2)
BASO%: 0.2 % (ref 0.0–2.0)
HCT: 37.4 % (ref 34.8–46.6)
HGB: 12.7 g/dL (ref 11.6–15.9)
LYMPH#: 2.1 10*3/uL (ref 0.9–3.3)
MONO#: 0.4 10*3/uL (ref 0.1–0.9)
NEUT#: 4.1 10*3/uL (ref 1.5–6.5)
NEUT%: 61.2 % (ref 39.6–80.0)
WBC: 6.7 10*3/uL (ref 3.9–10.0)

## 2013-06-26 LAB — FERRITIN CHCC: Ferritin: 134 ng/ml (ref 9–269)

## 2013-06-26 LAB — BASIC METABOLIC PANEL (CC13)
Anion Gap: 7 mEq/L (ref 3–11)
BUN: 12.2 mg/dL (ref 7.0–26.0)
Chloride: 108 mEq/L (ref 98–109)
Creatinine: 0.9 mg/dL (ref 0.6–1.1)
Glucose: 87 mg/dl (ref 70–140)
Potassium: 4.7 mEq/L (ref 3.5–5.1)
Sodium: 141 mEq/L (ref 136–145)

## 2013-06-26 LAB — RETICULOCYTES (CHCC)
ABS Retic: 38.4 10*3/uL (ref 19.0–186.0)
RBC.: 4.27 MIL/uL (ref 3.87–5.11)
Retic Ct Pct: 0.9 % (ref 0.4–2.3)

## 2013-06-26 LAB — IRON AND TIBC CHCC
%SAT: 22 % (ref 21–57)
TIBC: 293 ug/dL (ref 236–444)
UIBC: 229 ug/dL (ref 120–384)

## 2013-06-26 MED ORDER — OXYCODONE-ACETAMINOPHEN 5-325 MG PO TABS
2.0000 | ORAL_TABLET | Freq: Once | ORAL | Status: AC
  Administered 2013-06-26: 2 via ORAL
  Filled 2013-06-26: qty 2

## 2013-06-26 MED ORDER — HYDROCODONE-ACETAMINOPHEN 5-325 MG PO TABS: 2.0000 | ORAL_TABLET | Freq: Three times a day (TID) | ORAL | Status: DC | PRN

## 2013-06-26 NOTE — ED Provider Notes (Signed)
CSN: [id number]     Arrival date & time 06/25/13  2351 History   First MD Initiated Contact with Patient 06/26/13 0018     Chief Complaint  Patient presents with  . Leg Pain   (Consider location/radiation/quality/duration/timing/severity/associated sxs/prior Treatment) HPI 2 weeks 24/7 gradual onset moderately severe pain both calves without edema to legs or swelling or color change to lower legs, tonight woke feeling startled slight SOB for several seconds, ran outside, then resolved, no CP, doesn't recall if banged left lower leg on an object or not, noticed new tender bruise left anterior proximal lower leg so to ED, calves minimally tender symmetric and stable for 2 weeks, new ecchymosis left anterior lower leg I proximal tibia stable NT left knee, no PMH of easy bruising, no weakness or numbness to legs, no chest pain no abdominal pain no vomiting no fever no cough no lightheadedness no known trauma no immobilization no recent surgery. PERC negative. Past Medical History  Diagnosis Date  . Depression   . Hypertension   . Anemia     iron deficient, microcytic, hypochromic  . Anxiety   . Migraines   . 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 Fibroids     uterine  . Palpitations     recurrent  . Fatigue   . Tachycardia     unspecified  . Chest pain     atypical  . 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 Allena Katz at [facility]- S/p FNA March/april of two dominant nodules- benign.  Following for annual thyroid US with Dr. Allena Katz.    . Diabetes mellitus without complication    Past Surgical History  Procedure Laterality Date  . Appendectomy  03/2009  . Tubal ligation  1997  . Tuboplasty / tubotubal anastomosis  2005  . Biopsy thyroid  November 28, 2009    Dr. Allena Katz- endo  . Dilation and curettage of uterus  05/21/2011    Procedure: DILATATION AND CURETTAGE (D&C);  Surgeon: Robley Fries;  Location: [facility];  Service: Gynecology;  Laterality: N/A;  dilitation and  currettage/endometrial currettings   Family History  Problem Relation Age of Onset  . Hypertension Mother   . Cancer Mother 63    breast  . Cancer Father     colon  . Stroke Brother     handicapped due to complications from spinal meningitis  . Seizures Brother   . Asthma Son   . Arthritis Maternal Grandmother   . Hypertension Maternal Grandmother   . Stroke Maternal Grandfather    History  Substance Use Topics  . Smoking status: Never Smoker   . Smokeless tobacco: Never Used  . Alcohol Use: Yes     Comment: Once a month   OB History   Grav Para Term Preterm Abortions TAB SAB Ect Mult Living   4 2 1 1 2 1  1  2      Review of Systems 10 Systems reviewed and are negative for acute change except as noted in the HPI. Allergies  Amlodipine and Wellbutrin  Home Medications   Current Outpatient Rx  Name  Route  Sig  Dispense  Refill  . ALPRAZolam (XANAX) 1 MG tablet   Oral   Take 1 mg by mouth 4 (four) times daily as needed. For anxiety         . fluticasone (FLONASE) 50 MCG/ACT nasal spray   Nasal   Place 2 sprays into the nose as needed.         Marland Kitchen  losartan (COZAAR) 100 MG tablet   Oral   Take 1 tablet (100 mg total) by mouth daily.   30 tablet   5   . spironolactone (ALDACTONE) 25 MG tablet   Oral   Take 1 tablet (25 mg total) by mouth daily.   30 tablet   5   . acetaminophen (TYLENOL) 500 MG tablet   Oral   Take 1,000-1,500 mg by mouth every 6 (six) hours as needed. For headache         . EPIPEN 2-PAK 0.3 MG/0.3ML SOAJ injection               . HYDROcodone-acetaminophen (NORCO) 5-325 MG per tablet   Oral   Take 2 tablets by mouth every 8 (eight) hours as needed for pain.   6 tablet   0   . ibuprofen (ADVIL) 600 MG tablet   Oral   Take 1 tablet (600 mg total) by mouth every 6 (six) hours as needed for pain.   60 tablet   0   . oxymetazoline (AFRIN) 0.05 % nasal spray   Nasal   Place 2 sprays into the nose as needed.          BP  125/96  Pulse 82  Temp(Src) 97.9 F (36.6 C) (Oral)  Resp 16  SpO2 100%  LMP 06/11/2013 Physical Exam  Nursing note and vitals reviewed. Constitutional:  Awake, alert, nontoxic appearance.  HENT:  Head: Atraumatic.  Eyes: Right eye exhibits no discharge. Left eye exhibits no discharge.  Neck: Neck supple.  Cardiovascular: Normal rate and regular rhythm.   No murmur heard. Pulmonary/Chest: Effort normal and breath sounds normal. No respiratory distress. She has no wheezes. She has no rales. She exhibits no tenderness.  Abdominal: Soft. Bowel sounds are normal. She exhibits no distension and no mass. There is no tenderness. There is no rebound and no guarding.  Musculoskeletal: She exhibits tenderness. She exhibits no edema.  Baseline ROM, no obvious new focal weakness. Bilateral thighs nontender, bilateral knees nontender, left knee full extension and flexion past 90 with negative Lachman's testing negative McMurray's testing no laxity or pain with varus or valgus stress testing, left proximal lower leg medial aspect of proximal tibial region has localized approximately 3-4 cm diameter ecchymosis without excessive warmth or erythema or fluctuants to suggest abscess, bilateral calves mildly tender bilateral lower legs ankles and feet otherwise nontender without edema, dorsalis pedis pulses intact bilaterally, normal light touch to both legs and feet, good strength in both feet, capillary refill less than 2 seconds in the toes of both feet. No asymmetry to her calf circumference bilaterally.  Neurological: She is alert.  Mental status and motor strength appears baseline for patient and situation.  Skin: No rash noted.  Psychiatric: She has a normal mood and affect.    ED Course  Procedures (including critical care time) Labs Review Labs Reviewed - No data to display Imaging Review No results found.  ECG: Normal sinus rhythm, ventricular rate 76, normal axis, normal intervals,  impression normal ECG, no significant change noted compared with previous tracing  MDM   1. Leg pain, bilateral    PERC negative; feel low risk for DVT. Doubt ACS. I doubt any other EMC precluding discharge at this time including, but not necessarily limited to the following:PE, ACS.    Hurman Horn, MD 06/26/13 0500

## 2013-06-27 ENCOUNTER — Ambulatory Visit (INDEPENDENT_AMBULATORY_CARE_PROVIDER_SITE_OTHER): Payer: BC Managed Care – PPO | Admitting: Psychology

## 2013-06-27 ENCOUNTER — Telehealth: Payer: Self-pay | Admitting: Nurse Practitioner

## 2013-06-27 DIAGNOSIS — F410 Panic disorder [episodic paroxysmal anxiety] without agoraphobia: Secondary | ICD-10-CM

## 2013-06-27 NOTE — Progress Notes (Signed)
This office note has been dictated.

## 2013-06-27 NOTE — Telephone Encounter (Addendum)
Message copied by Glee Arvin on Fri Jun 27, 2013  3:04 PM ------      Message from: Arlan Organ R      Created: Thu Jun 26, 2013  5:40 PM       Call - iron is dropping.Please set up IV Feraheme- 1020mg  - x 1 dose.  Vit B12 is ok. Pete ------Called pt and informed her of the above message per Dr. Myna Hidalgo. Pt verbalized her understanding and appreciation and has been scheduled to receive her iron.

## 2013-06-28 NOTE — Progress Notes (Signed)
DIAGNOSES: 1. Recurrent iron-deficiency anemia. 2. Menometrorrhagia.  CURRENT THERAPY:  IV iron as indicated.  INTERIM HISTORY:  [name] comes in for followup.  She still feels a little tired.  She still has some heavy cycles.  There is question of her having a fibroid.  She has had no bleeding outside of the monthly cycles.  She has had no rashes.  Her appetite has been okay.  She is not a vegetarian.  There has been no cough or shortness breath.  She does feel tired.  There has been no leg swelling.  She has had some arthralgias and myalgias.  We did check her vitamin B12 level.  It was normal at 400.  She has had no weight loss or weight gain.  PHYSICAL EXAMINATION:  General:  This is a fairly well-developed, well- nourished African American female in no obvious distress.  Vital signs: Temperature of 98, pulse 78, respiratory rate 14, blood pressure 114/81. Weight is 163 pounds.  Head and neck:  Normocephalic, atraumatic skull. There are no ocular or oral lesions.  There are no palpable cervical or supraclavicular lymph nodes.  Lungs:  Clear bilaterally.  Cardiac: Regular rate and rhythm with a normal S1 and S2.  There are no murmurs, rubs or bruits.  Abdomen:  Soft.  She has good bowel sounds.  There is no fluid wave.  There may be some slight tenderness in the lower abdomen.  There is no palpable hepatosplenomegaly.  Extremities:  Show no clubbing, cyanosis or edema.  Neurological exam:  No focal neurological deficits.  Skin exam:  No rashes, ecchymosis, or petechia.  LABORATORY STUDIES:  White cell count 6.7, hemoglobin 12.7, hematocrit 37.4, platelet count 187.  Her ferritin is 134 with an iron saturation of 22%.  She had bilateral lower extremity Dopplers.  These were negative.  Her ANA is negative.  IMPRESSION:  [name] is a nice 42 year old African female.  Her iron levels do appear to be dropping from my point of view.  I think some of her ferritin is  an acute phase reactant.  I think she probably would benefit from another dose of IV iron.  We will go ahead and try to get this set up for her.  Hopefully, we can get her to feel a little better with the upcoming holidays.  I want see her back in another couple months or so.  Maybe, we will see if she is feeling better then.    ______________________________ Josph Macho, M.D. PRE/MEDQ  D:  06/27/2013  T:  06/28/2013  Job:  [id number]

## 2013-07-09 ENCOUNTER — Ambulatory Visit (HOSPITAL_BASED_OUTPATIENT_CLINIC_OR_DEPARTMENT_OTHER): Payer: BC Managed Care – PPO

## 2013-07-09 VITALS — BP 133/91 | HR 104 | Temp 99.2°F | Resp 20

## 2013-07-09 DIAGNOSIS — D508 Other iron deficiency anemias: Secondary | ICD-10-CM

## 2013-07-09 MED ORDER — HEPARIN SOD (PORK) LOCK FLUSH 100 UNIT/ML IV SOLN
250.0000 [IU] | Freq: Once | INTRAVENOUS | Status: DC | PRN
  Filled 2013-07-09: qty 5

## 2013-07-09 MED ORDER — SODIUM CHLORIDE 0.9 % IV SOLN
Freq: Once | INTRAVENOUS | Status: AC
  Administered 2013-07-09: 15:00:00 via INTRAVENOUS

## 2013-07-09 MED ORDER — SODIUM CHLORIDE 0.9 % IV SOLN
1020.0000 mg | Freq: Once | INTRAVENOUS | Status: AC
  Administered 2013-07-09: 1020 mg via INTRAVENOUS
  Filled 2013-07-09: qty 34

## 2013-07-09 MED ORDER — SODIUM CHLORIDE 0.9 % IJ SOLN
10.0000 mL | INTRAMUSCULAR | Status: DC | PRN
  Filled 2013-07-09: qty 10

## 2013-07-09 MED ORDER — ALTEPLASE 2 MG IJ SOLR
2.0000 mg | Freq: Once | INTRAMUSCULAR | Status: DC | PRN
  Filled 2013-07-09: qty 2

## 2013-07-09 MED ORDER — SODIUM CHLORIDE 0.9 % IJ SOLN
3.0000 mL | Freq: Once | INTRAMUSCULAR | Status: DC | PRN
  Filled 2013-07-09: qty 10

## 2013-07-09 MED ORDER — HEPARIN SOD (PORK) LOCK FLUSH 100 UNIT/ML IV SOLN
500.0000 [IU] | Freq: Once | INTRAVENOUS | Status: DC | PRN
  Filled 2013-07-09: qty 5

## 2013-07-09 NOTE — Patient Instructions (Signed)
Ferumoxytol injection What is this medicine? FERUMOXYTOL is an iron complex. Iron is used to make healthy red blood cells, which carry oxygen and nutrients throughout the body. This medicine is used to treat iron deficiency anemia in people with chronic kidney disease. This medicine may be used for other purposes; ask your health care provider or pharmacist if you have questions. What should I tell my health care provider before I take this medicine? They need to know if you have any of these conditions: -anemia not caused by low iron levels -high levels of iron in the blood -magnetic resonance imaging (MRI) test scheduled -an unusual or allergic reaction to iron, other medicines, foods, dyes, or preservatives -pregnant or trying to get pregnant -breast-feeding How should I use this medicine? This medicine is for infusion into a vein. It is given by a health care professional in a hospital or clinic setting. Talk to your pediatrician regarding the use of this medicine in children. Special care may be needed. Overdosage: If you think you've taken too much of this medicine contact a poison control center or emergency room at once. Overdosage: If you think you have taken too much of this medicine contact a poison control center or emergency room at once. NOTE: This medicine is only for you. Do not share this medicine with others. What if I miss a dose? It is important not to miss your dose. Call your doctor or health care professional if you are unable to keep an appointment. What may interact with this medicine? This medicine may interact with the following medications: -other iron products This list may not describe all possible interactions. Give your health care provider a list of all the medicines, herbs, non-prescription drugs, or dietary supplements you use. Also tell them if you smoke, drink alcohol, or use illegal drugs. Some items may interact with your medicine. What should I watch  for while using this medicine? Visit your doctor or healthcare professional regularly. Tell your doctor or healthcare professional if your symptoms do not start to get better or if they get worse. You may need blood work done while you are taking this medicine. You may need to follow a special diet. Talk to your doctor. Foods that contain iron include: whole grains/cereals, dried fruits, beans, or peas, leafy green vegetables, and organ meats (liver, kidney). What side effects may I notice from receiving this medicine? Side effects that you should report to your doctor or health care professional as soon as possible: -allergic reactions like skin rash, itching or hives, swelling of the face, lips, or tongue -breathing problems -changes in blood pressure -feeling faint or lightheaded, falls -fever or chills -flushing, sweating, or hot feelings -swelling of the ankles or feet Side effects that usually do not require medical attention (Report these to your doctor or health care professional if they continue or are bothersome.): -diarrhea -headache -nausea, vomiting -stomach pain This list may not describe all possible side effects. Call your doctor for medical advice about side effects. You may report side effects to FDA at 1-800-FDA-1088. Where should I keep my medicine? This drug is given in a hospital or clinic and will not be stored at home. NOTE: This sheet is a summary. It may not cover all possible information. If you have questions about this medicine, talk to your doctor, pharmacist, or health care provider.  2013, [employer]. (05/20/2008 9:48:25 PM)  

## 2013-07-11 ENCOUNTER — Ambulatory Visit: Payer: BC Managed Care – PPO | Admitting: Psychology

## 2013-07-25 ENCOUNTER — Ambulatory Visit: Payer: BC Managed Care – PPO | Admitting: Psychology

## 2013-07-30 ENCOUNTER — Telehealth: Payer: Self-pay | Admitting: Neurology

## 2013-07-30 ENCOUNTER — Ambulatory Visit: Payer: BC Managed Care – PPO | Admitting: Psychology

## 2013-07-30 NOTE — Telephone Encounter (Signed)
Left message that I wasn't the person she spoke to about a neurologist in [place] but I did relay message that there is [facility] at [phone] and [facility] at [phone].  Told her to call if she had further questions.

## 2013-07-30 NOTE — Telephone Encounter (Signed)
Message copied by Elisha Headland on Wed Jul 30, 2013 10:30 AM ------      Message from: Seth Bake      Created: Wed Jul 09, 2013 12:05 PM       Patient said she spoke to you about a neurologist in [place] that did not require a referral?  Who is that?  She cant remember.  Please call her at [phone].   Ok to leave message with the name.   ------

## 2013-08-11 ENCOUNTER — Encounter (HOSPITAL_BASED_OUTPATIENT_CLINIC_OR_DEPARTMENT_OTHER): Payer: Self-pay | Admitting: Emergency Medicine

## 2013-08-11 ENCOUNTER — Emergency Department (HOSPITAL_BASED_OUTPATIENT_CLINIC_OR_DEPARTMENT_OTHER): Payer: BC Managed Care – PPO

## 2013-08-11 ENCOUNTER — Ambulatory Visit: Payer: BC Managed Care – PPO | Admitting: Family

## 2013-08-11 ENCOUNTER — Emergency Department (HOSPITAL_BASED_OUTPATIENT_CLINIC_OR_DEPARTMENT_OTHER)
Admission: EM | Admit: 2013-08-11 | Discharge: 2013-08-11 | Disposition: A | Discharge status: Home / Self Care | Payer: BC Managed Care – PPO | Attending: Emergency Medicine | Admitting: Emergency Medicine

## 2013-08-11 DIAGNOSIS — R1013 Epigastric pain: Secondary | ICD-10-CM | POA: Insufficient documentation

## 2013-08-11 DIAGNOSIS — Z862 Personal history of diseases of the blood and blood-forming organs and certain disorders involving the immune mechanism: Secondary | ICD-10-CM | POA: Insufficient documentation

## 2013-08-11 DIAGNOSIS — M546 Pain in thoracic spine: Secondary | ICD-10-CM | POA: Insufficient documentation

## 2013-08-11 DIAGNOSIS — G43909 Migraine, unspecified, not intractable, without status migrainosus: Secondary | ICD-10-CM | POA: Insufficient documentation

## 2013-08-11 DIAGNOSIS — R000 Tachycardia, unspecified: Secondary | ICD-10-CM | POA: Insufficient documentation

## 2013-08-11 DIAGNOSIS — J029 Acute pharyngitis, unspecified: Secondary | ICD-10-CM | POA: Insufficient documentation

## 2013-08-11 DIAGNOSIS — I10 Essential (primary) hypertension: Secondary | ICD-10-CM | POA: Insufficient documentation

## 2013-08-11 DIAGNOSIS — Z9089 Acquired absence of other organs: Secondary | ICD-10-CM | POA: Insufficient documentation

## 2013-08-11 DIAGNOSIS — R079 Chest pain, unspecified: Secondary | ICD-10-CM | POA: Insufficient documentation

## 2013-08-11 DIAGNOSIS — E119 Type 2 diabetes mellitus without complications: Secondary | ICD-10-CM | POA: Insufficient documentation

## 2013-08-11 DIAGNOSIS — F329 Major depressive disorder, single episode, unspecified: Secondary | ICD-10-CM | POA: Insufficient documentation

## 2013-08-11 DIAGNOSIS — Z8742 Personal history of other diseases of the female genital tract: Secondary | ICD-10-CM | POA: Insufficient documentation

## 2013-08-11 DIAGNOSIS — Z0289 Encounter for other administrative examinations: Secondary | ICD-10-CM

## 2013-08-11 DIAGNOSIS — IMO0002 Reserved for concepts with insufficient information to code with codable children: Secondary | ICD-10-CM | POA: Insufficient documentation

## 2013-08-11 DIAGNOSIS — Z8611 Personal history of tuberculosis: Secondary | ICD-10-CM | POA: Insufficient documentation

## 2013-08-11 DIAGNOSIS — F411 Generalized anxiety disorder: Secondary | ICD-10-CM | POA: Insufficient documentation

## 2013-08-11 DIAGNOSIS — Z79899 Other long term (current) drug therapy: Secondary | ICD-10-CM | POA: Insufficient documentation

## 2013-08-11 DIAGNOSIS — F3289 Other specified depressive episodes: Secondary | ICD-10-CM | POA: Insufficient documentation

## 2013-08-11 DIAGNOSIS — R1011 Right upper quadrant pain: Secondary | ICD-10-CM | POA: Insufficient documentation

## 2013-08-11 DIAGNOSIS — Z9851 Tubal ligation status: Secondary | ICD-10-CM | POA: Insufficient documentation

## 2013-08-11 LAB — CBC WITH DIFFERENTIAL/PLATELET
Band Neutrophils: 2 % (ref 0–10)
Basophils Absolute: 0 10*3/uL (ref 0.0–0.1)
Basophils Relative: 0 % (ref 0–1)
Eosinophils Absolute: 0.2 10*3/uL (ref 0.0–0.7)
Eosinophils Relative: 1 % (ref 0–5)
HCT: 43.5 % (ref 36.0–46.0)
Hemoglobin: 15.2 g/dL — ABNORMAL HIGH (ref 12.0–15.0)
Lymphocytes Relative: 16 % (ref 12–46)
Lymphs Abs: 2.7 10*3/uL (ref 0.7–4.0)
MCHC: 34.9 g/dL (ref 30.0–36.0)
Monocytes Absolute: 1.4 10*3/uL — ABNORMAL HIGH (ref 0.1–1.0)
Monocytes Relative: 8 % (ref 3–12)
Neutrophils Relative %: 70 % (ref 43–77)
RBC: 4.99 MIL/uL (ref 3.87–5.11)
WBC: 16.9 10*3/uL — ABNORMAL HIGH (ref 4.0–10.5)
nRBC: 0 /100 WBC

## 2013-08-11 LAB — URINALYSIS, ROUTINE W REFLEX MICROSCOPIC
Bilirubin Urine: NEGATIVE
Leukocytes, UA: NEGATIVE
Nitrite: NEGATIVE
Protein, ur: NEGATIVE mg/dL
Specific Gravity, Urine: 1.016 (ref 1.005–1.030)
Urobilinogen, UA: 0.2 mg/dL (ref 0.0–1.0)

## 2013-08-11 LAB — BASIC METABOLIC PANEL
Calcium: 10.6 mg/dL — ABNORMAL HIGH (ref 8.4–10.5)
Creatinine, Ser: 1 mg/dL (ref 0.50–1.10)
GFR calc Af Amer: 79 mL/min — ABNORMAL LOW (ref >90)

## 2013-08-11 LAB — HEPATIC FUNCTION PANEL
Bilirubin, Direct: 0.1 mg/dL (ref 0.0–0.3)
Indirect Bilirubin: 0.6 mg/dL (ref 0.3–0.9)
Total Bilirubin: 0.7 mg/dL (ref 0.3–1.2)

## 2013-08-11 LAB — LIPASE, BLOOD: Lipase: 50 U/L (ref 11–59)

## 2013-08-11 LAB — TROPONIN I: Troponin I: <0.3 ng/mL (ref <0.30)

## 2013-08-11 MED ORDER — RANITIDINE HCL 150 MG PO TABS: 150.0000 mg | ORAL_TABLET | Freq: Two times a day (BID) | ORAL | Status: DC

## 2013-08-11 MED ORDER — IOHEXOL 300 MG/ML  SOLN
50.0000 mL | Freq: Once | INTRAMUSCULAR | Status: AC | PRN
  Administered 2013-08-11: 50 mL via ORAL

## 2013-08-11 MED ORDER — GI COCKTAIL ~~LOC~~
30.0000 mL | Freq: Once | ORAL | Status: AC
  Administered 2013-08-11: 30 mL via ORAL
  Filled 2013-08-11: qty 30

## 2013-08-11 MED ORDER — IOHEXOL 300 MG/ML  SOLN
100.0000 mL | Freq: Once | INTRAMUSCULAR | Status: AC | PRN
  Administered 2013-08-11: 100 mL via INTRAVENOUS

## 2013-08-11 NOTE — ED Notes (Signed)
Patient transported to CT 

## 2013-08-11 NOTE — ED Provider Notes (Signed)
CSN: [id number]     Arrival date & time 08/11/13  1747 History   First MD Initiated Contact with Patient 08/11/13 1803     Chief Complaint  Patient presents with  . Chest Pain   (Consider location/radiation/quality/duration/timing/severity/associated sxs/prior Treatment) Patient is a 42 y.o. female presenting with chest pain. The history is provided by the patient.  Chest Pain Pain location:  Substernal area Pain quality: burning   Pain radiates to:  Mid back Pain radiates to the back: yes   Onset quality:  Gradual Duration:  4 days Timing:  Constant Progression:  Worsening Chronicity:  New Relieved by:  Nothing Worsened by:  Movement Ineffective treatments:  Antacids Associated symptoms: back pain   Associated symptoms: no abdominal pain, no cough, no dizziness, no dysphagia, no fever, no lower extremity edema, no nausea, no near-syncope, no shortness of breath, not vomiting and no weakness    Michele [name] is a 42 y.o. female who presents to the ED with chest pain that started 4 days ago. She states that she has a history of chest pain related to anxiety but this time she has not been anxious at all and the chest pain is different. She describes the pain as burning.  She rates the pain as 8/10 in the substernal area and goes to the back. She has taken Zantac, Tums and ibuprofen without relief.  Past Medical History  Diagnosis Date  . Depression   . Hypertension   . Anemia     iron deficient, microcytic, hypochromic  . Anxiety   . Migraines   . 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 Fibroids     uterine  . Palpitations     recurrent  . Fatigue   . Tachycardia     unspecified  . Chest pain     atypical  . 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 Allena Katz at [facility]- S/p FNA March/april of two dominant nodules- benign.  Following for annual thyroid US with Dr. Allena Katz.    . Diabetes mellitus without complication    Past Surgical History   Procedure Laterality Date  . Appendectomy  03/2009  . Tubal ligation  1997  . Tuboplasty / tubotubal anastomosis  2005  . Biopsy thyroid  November 28, 2009    Dr. Allena Katz- endo  . Dilation and curettage of uterus  05/21/2011    Procedure: DILATATION AND CURETTAGE (D&C);  Surgeon: Robley Fries;  Location: [facility];  Service: Gynecology;  Laterality: N/A;  dilitation and currettage/endometrial currettings   Family History  Problem Relation Age of Onset  . Hypertension Mother   . Cancer Mother 9    breast  . Cancer Father     colon  . Stroke Brother     handicapped due to complications from spinal meningitis  . Seizures Brother   . Asthma Son   . Arthritis Maternal Grandmother   . Hypertension Maternal Grandmother   . Stroke Maternal Grandfather    History  Substance Use Topics  . Smoking status: Never Smoker   . Smokeless tobacco: Never Used  . Alcohol Use: Yes     Comment: Once a month   OB History   Grav Para Term Preterm Abortions TAB SAB Ect Mult Living   4 2 1 1 2 1  1  2      Review of Systems  Constitutional: Negative for fever, chills and appetite change.  HENT: Positive for sore throat. Negative for congestion  and trouble swallowing.   Eyes: Negative for pain and redness.  Respiratory: Positive for chest tightness. Negative for cough and shortness of breath.   Cardiovascular: Positive for chest pain. Negative for near-syncope.  Gastrointestinal: Negative for nausea, vomiting and abdominal pain.  Genitourinary: Negative for dysuria and decreased urine volume.  Musculoskeletal: Positive for back pain.  Skin: Negative for rash.  Allergic/Immunologic: Negative for environmental allergies and food allergies.  Neurological: Negative for dizziness and weakness.  Psychiatric/Behavioral: The patient is not nervous/anxious.     Allergies  Amlodipine; Bee venom; and Wellbutrin  Home Medications   Current Outpatient Rx  Name  Route  Sig  Dispense  Refill  .  acetaminophen (TYLENOL) 500 MG tablet   Oral   Take 1,000-1,500 mg by mouth every 6 (six) hours as needed. For headache         . ALPRAZolam (XANAX) 1 MG tablet   Oral   Take 1 mg by mouth 4 (four) times daily as needed. For anxiety         . betamethasone dipropionate (DIPROLENE) 0.05 % cream   Topical   Apply topically as needed.          Marland Kitchen EPIPEN 2-PAK 0.3 MG/0.3ML SOAJ injection      as needed.          . fluticasone (FLONASE) 50 MCG/ACT nasal spray   Nasal   Place 2 sprays into the nose as needed.         Marland Kitchen ibuprofen (ADVIL) 600 MG tablet   Oral   Take 1 tablet (600 mg total) by mouth every 6 (six) hours as needed for pain.   60 tablet   0   . losartan (COZAAR) 100 MG tablet   Oral   Take 1 tablet (100 mg total) by mouth daily.   30 tablet   5   . oxymetazoline (AFRIN) 0.05 % nasal spray   Nasal   Place 2 sprays into the nose as needed.         Marland Kitchen spironolactone (ALDACTONE) 25 MG tablet   Oral   Take 1 tablet (25 mg total) by mouth daily.   30 tablet   5    BP 121/79  Pulse 86  Temp(Src) 98.1 F (36.7 C) (Oral)  Resp 16  Ht 5\' 4"  (1.626 m)  Wt 165 lb (74.844 kg)  BMI 28.31 kg/m2  SpO2 100% Physical Exam  Nursing note and vitals reviewed. Constitutional: She is oriented to person, place, and time. She appears well-developed and well-nourished. No distress.  HENT:  Head: Normocephalic and atraumatic.  Eyes: Conjunctivae and EOM are normal.  Neck: Normal range of motion. Neck supple.  Cardiovascular: Regular rhythm.  Tachycardia present.   Pulmonary/Chest: Effort normal and breath sounds normal.  Abdominal: Soft. Bowel sounds are normal. There is tenderness in the right upper quadrant and epigastric area. There is no rebound, no guarding and no CVA tenderness.  Musculoskeletal: Normal range of motion.  Neurological: She is alert and oriented to person, place, and time. No cranial nerve deficit.  Skin: Skin is warm and dry.  Psychiatric:  She has a normal mood and affect. Her behavior is normal.   Results for orders placed during the hospital encounter of 08/11/13 (from the past 24 hour(s))  LIPASE, BLOOD     Status: None   Collection Time    08/11/13  6:15 PM      Result Value Range   Lipase 50  11 - 59  U/L  CBC WITH DIFFERENTIAL     Status: Abnormal   Collection Time    08/11/13  6:15 PM      Result Value Range   WBC 16.9 (*) 4.0 - 10.5 K/uL   RBC 4.99  3.87 - 5.11 MIL/uL   Hemoglobin 15.2 (*) 12.0 - 15.0 g/dL   HCT 16.1  09.6 - 04.5 %   MCV 87.2  78.0 - 100.0 fL   MCH 30.5  26.0 - 34.0 pg   MCHC 34.9  30.0 - 36.0 g/dL   RDW 40.9  81.1 - 91.4 %   Platelets 234  150 - 400 K/uL   Neutrophils Relative % 70  43 - 77 %   Lymphocytes Relative 16  12 - 46 %   Monocytes Relative 8  3 - 12 %   Eosinophils Relative 1  0 - 5 %   Basophils Relative 0  0 - 1 %   Band Neutrophils 2  0 - 10 %   Metamyelocytes Relative 3     Myelocytes 0     Promyelocytes Absolute 0     Blasts 0     nRBC 0  0 /100 WBC   Neutro Abs 12.6 (*) 1.7 - 7.7 K/uL   Lymphs Abs 2.7  0.7 - 4.0 K/uL   Monocytes Absolute 1.4 (*) 0.1 - 1.0 K/uL   Eosinophils Absolute 0.2  0.0 - 0.7 K/uL   Basophils Absolute 0.0  0.0 - 0.1 K/uL   WBC Morphology MILD LEFT SHIFT (1-5% METAS, OCC MYELO, OCC BANDS)    BASIC METABOLIC PANEL     Status: Abnormal   Collection Time    08/11/13  6:15 PM      Result Value Range   Sodium 137  135 - 145 mEq/L   Potassium 4.6  3.5 - 5.1 mEq/L   Chloride 97  96 - 112 mEq/L   CO2 30  19 - 32 mEq/L   Glucose, Bld 101 (*) 70 - 99 mg/dL   BUN 21  6 - 23 mg/dL   Creatinine, Ser 7.82  0.50 - 1.10 mg/dL   Calcium 95.6 (*) 8.4 - 10.5 mg/dL   GFR calc non Af Amer 68 (*) >90 mL/min   GFR calc Af Amer 79 (*) >90 mL/min  TROPONIN I     Status: None   Collection Time    08/11/13  6:15 PM      Result Value Range   Troponin I <0.30  <0.30 ng/mL  HEPATIC FUNCTION PANEL     Status: None   Collection Time    08/11/13  6:15 PM       Result Value Range   Total Protein 8.0  6.0 - 8.3 g/dL   Albumin 4.3  3.5 - 5.2 g/dL   AST 13  0 - 37 U/L   ALT 25  0 - 35 U/L   Alkaline Phosphatase 65  39 - 117 U/L   Total Bilirubin 0.7  0.3 - 1.2 mg/dL   Bilirubin, Direct 0.1  0.0 - 0.3 mg/dL   Indirect Bilirubin 0.6  0.3 - 0.9 mg/dL  URINALYSIS, ROUTINE W REFLEX MICROSCOPIC     Status: None   Collection Time    08/11/13  7:35 PM      Result Value Range   Color, Urine YELLOW  YELLOW   APPearance CLEAR  CLEAR   Specific Gravity, Urine 1.016  1.005 - 1.030   pH 6.0  5.0 - 8.0  Glucose, UA NEGATIVE  NEGATIVE mg/dL   Hgb urine dipstick NEGATIVE  NEGATIVE   Bilirubin Urine NEGATIVE  NEGATIVE   Ketones, ur NEGATIVE  NEGATIVE mg/dL   Protein, ur NEGATIVE  NEGATIVE mg/dL   Urobilinogen, UA 0.2  0.0 - 1.0 mg/dL   Nitrite NEGATIVE  NEGATIVE   Leukocytes, UA NEGATIVE  NEGATIVE    ED Course: Discussed with Dr. Karma Ganja and will get CT of abdomen since the patient has an elevated WBC and ultrasound not available.   Procedures  Dg Chest 2 View  08/11/2013   CLINICAL DATA:  Chest pain.  EXAM: CHEST  2 VIEW  COMPARISON:  Chest x-ray 05/08/2013.  FINDINGS: Lung volumes are normal. No consolidative airspace disease. No pleural effusions. No pneumothorax. No pulmonary nodule or mass noted. Pulmonary vasculature and the cardiomediastinal silhouette are within normal limits.  IMPRESSION: 1.  No radiographic evidence of acute cardiopulmonary disease.   Electronically Signed   By: Trudie Reed M.D.   On: 08/11/2013 18:51   Ct Abdomen Pelvis W Contrast  08/11/2013   CLINICAL DATA:  Right upper quadrant pain. Elevated white blood cell count. Epigastric pain. Prior appendectomy.  EXAM: CT ABDOMEN AND PELVIS WITH CONTRAST  TECHNIQUE: Multidetector CT imaging of the abdomen and pelvis was performed using the standard protocol following bolus administration of intravenous contrast.  CONTRAST:  50mL OMNIPAQUE IOHEXOL 300 MG/ML SOLN, OMNIPAQUE IOHEXOL  300 MG/ML SOLN  COMPARISON:  12/10/2012  FINDINGS: Lower Chest: Clear lung bases. Normal heart size without pericardial or pleural effusion.  Abdomen/Pelvis: Mild hepatic steatosis, without focal liver lesion. Normal spleen, stomach, pancreas, gallbladder, biliary tract, adrenal glands, kidneys.  No retroperitoneal or retrocrural adenopathy. Colonic stool burden suggests constipation. Normal terminal ileum. Appendix surgically absent. Normal small bowel without abdominal ascites.  No pelvic adenopathy. Normal urinary bladder and uterus. No adnexal mass. The previously described cystic pelvic mass is either resolved or been resected. Trace free pelvic fluid is likely physiologic.  Bones/Musculoskeletal:  No acute osseous abnormality.  IMPRESSION: 1.  Possible constipation. 2. No other explanation for epigastric abdominal pain. 3. Hepatic steatosis. 4. Resolution or resection of previously described cystic pelvic mass.   Electronically Signed   By: Jeronimo Greaves M.D.   On: 08/11/2013 21:22     Date/Time:  Monday August 11 2013 17:54:37 EST Ventricular Rate:  112 PR Interval:  114 QRS Duration: 78 QT Interval:  316 QTC Calculation: 431 R Axis:   64 Text Interpretation:  Sinus tachycardia Otherwise normal ECG Since previous tracing rate faster Confirmed by Karma Ganja  MD, MARTHA [phone]) on 08/11/2013 11:20:29 PM 42 y.o. female with chest and epigastric and RUQ pain. Improvement with GI cocktail. Stable for discharge without any immediate complications. Vital signs stable  BP 121/79  Pulse 86  Temp(Src) 98.1 F (36.7 C) (Oral)  Resp 16  Ht 5\' 4"  (1.626 m)  Wt 165 lb (74.844 kg)  BMI 28.31 kg/m2  SpO2 100%  I have reviewed this patient's vital signs, nurses notes, appropriate labs and imaging.  I have discussed findings and plan of care with the patient. She voices understanding. She will follow up with her PCP or return here for any problems.    Medication List    TAKE these medications        ranitidine 150 MG tablet  Commonly known as:  ZANTAC  Take 1 tablet (150 mg total) by mouth 2 (two) times daily.      ASK your doctor about these medications  acetaminophen 500 MG tablet  Commonly known as:  TYLENOL  Take 1,000-1,500 mg by mouth every 6 (six) hours as needed. For headache     ALPRAZolam 1 MG tablet  Commonly known as:  XANAX  Take 1 mg by mouth 4 (four) times daily as needed. For anxiety     betamethasone dipropionate 0.05 % cream  Commonly known as:  DIPROLENE  Apply topically as needed.     EPIPEN 2-PAK 0.3 mg/0.3 mL Soaj injection  Generic drug:  EPINEPHrine  as needed.     fluticasone 50 MCG/ACT nasal spray  Commonly known as:  FLONASE  Place 2 sprays into the nose as needed.     ibuprofen 600 MG tablet  Commonly known as:  ADVIL  Take 1 tablet (600 mg total) by mouth every 6 (six) hours as needed for pain.     losartan 100 MG tablet  Commonly known as:  COZAAR  Take 1 tablet (100 mg total) by mouth daily.     oxymetazoline 0.05 % nasal spray  Commonly known as:  AFRIN  Place 2 sprays into the nose as needed.     spironolactone 25 MG tablet  Commonly known as:  ALDACTONE  Take 1 tablet (25 mg total) by mouth daily.             MDM: I discussed this case with Dr. Karma Ganja   42 y.o. female with chest discomfort and     Janne Napoleon, NP 08/12/13 [phone]

## 2013-08-11 NOTE — ED Notes (Signed)
Pt reports chest burning radiating into her back since Thanksgiving.  Denies N/V/Diaphoresis.  Denies change with eating.

## 2013-08-11 NOTE — ED Notes (Signed)
Returned from ct 

## 2013-08-12 ENCOUNTER — Other Ambulatory Visit: Payer: Self-pay | Admitting: Gastroenterology

## 2013-08-12 DIAGNOSIS — R1011 Right upper quadrant pain: Secondary | ICD-10-CM

## 2013-08-12 NOTE — ED Provider Notes (Signed)
Medical screening examination/treatment/procedure(s) were performed by non-physician practitioner and as supervising physician I was immediately available for consultation/collaboration.  EKG Interpretation    Date/Time:  Monday August 11 2013 17:54:37 EST Ventricular Rate:  112 PR Interval:  114 QRS Duration: 78 QT Interval:  316 QTC Calculation: 431 R Axis:   64 Text Interpretation:  Sinus tachycardia Otherwise normal ECG Since previous tracing rate faster Confirmed by Karma Ganja  MD, MARTHA [phone]) on 08/11/2013 11:20:29 PM             Ethelda Chick, MD 08/12/13 1719

## 2013-08-13 ENCOUNTER — Encounter: Payer: Self-pay | Admitting: Cardiovascular Disease

## 2013-08-13 ENCOUNTER — Ambulatory Visit (INDEPENDENT_AMBULATORY_CARE_PROVIDER_SITE_OTHER): Payer: BC Managed Care – PPO | Admitting: Cardiovascular Disease

## 2013-08-13 VITALS — BP 118/70 | HR 80 | Ht 64.0 in | Wt 164.4 lb

## 2013-08-13 DIAGNOSIS — I10 Essential (primary) hypertension: Secondary | ICD-10-CM

## 2013-08-13 MED ORDER — METOPROLOL SUCCINATE ER 25 MG PO TB24: 25.0000 mg | ORAL_TABLET | Freq: Every day | ORAL | Status: DC

## 2013-08-13 MED ORDER — LOSARTAN POTASSIUM 100 MG PO TABS: 100.0000 mg | ORAL_TABLET | Freq: Every day | ORAL | Status: DC

## 2013-08-13 NOTE — Patient Instructions (Signed)
Your physician has recommended you make the following change in your medication: START Metoprolol Succinate 25mg  take one by mouth at bedtime  Your physician wants you to follow-up in: 1 YEAR with Dr Excell Seltzer.  You will receive a reminder letter in the mail two months in advance. If you don't receive a letter, please call our office to schedule the follow-up appointment.

## 2013-08-13 NOTE — Progress Notes (Signed)
HPI:  42 year old woman presenting for followup evaluation. She has been followed for hypertension, heart palpitations, and chronic chest pain. Cardiac catheterization was done in 2013 and showed normal coronary arteries. She continues to exercise regularly without exertional symptoms. She has been able to keep her weight down with this exercise program and dietary changes. Her primary complaint relates to frequent palpitations. These occur primarily at rest. She has episodes of irregular beats and "heart racing." Her palpitations are more frequent than in the past. She has not had presyncope or frank syncope. She denies edema, orthopnea, or PND. She drinks caffeine rarely.  Outpatient Encounter Prescriptions as of 08/13/2013  Medication Sig  . acetaminophen (TYLENOL) 500 MG tablet Take 1,000-1,500 mg by mouth every 6 (six) hours as needed. For headache  . ALPRAZolam (XANAX) 1 MG tablet Take 1 mg by mouth 4 (four) times daily as needed. For anxiety  . betamethasone dipropionate (DIPROLENE) 0.05 % cream Apply topically as needed.   Marland Kitchen dexlansoprazole (DEXILANT) 60 MG capsule Take 60 mg by mouth daily.  Marland Kitchen EPIPEN 2-PAK 0.3 MG/0.3ML SOAJ injection as needed.   . fluticasone (FLONASE) 50 MCG/ACT nasal spray Place 2 sprays into the nose as needed.  Marland Kitchen ibuprofen (ADVIL) 600 MG tablet Take 1 tablet (600 mg total) by mouth every 6 (six) hours as needed for pain.  Marland Kitchen losartan (COZAAR) 100 MG tablet Take 1 tablet (100 mg total) by mouth daily.  Marland Kitchen oxymetazoline (AFRIN) 0.05 % nasal spray Place 2 sprays into the nose as needed.  . ranitidine (ZANTAC) 150 MG tablet Take 1 tablet (150 mg total) by mouth 2 (two) times daily.  Marland Kitchen spironolactone (ALDACTONE) 25 MG tablet Take 1 tablet (25 mg total) by mouth daily.    Allergies  Allergen Reactions  . Amlodipine Shortness Of Breath and Palpitations  . Bee Venom   . Wellbutrin [Bupropion Hcl] Other (See Comments)    Dizziness, increased anxiety    Past Medical  History  Diagnosis Date  . Depression   . Hypertension   . Anemia     iron deficient, microcytic, hypochromic  . Anxiety   . Migraines   . 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 Fibroids     uterine  . Palpitations     recurrent  . Fatigue   . Tachycardia     unspecified  . Chest pain     atypical  . 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 Allena Katz at [facility]- S/p FNA March/april of two dominant nodules- benign.  Following for annual thyroid US with Dr. Allena Katz.    . Diabetes mellitus without complication     ROS: Negative except as per HPI  BP 118/70  Pulse 80  Ht 5\' 4"  (1.626 m)  Wt 164 lb 6.4 oz (74.571 kg)  BMI 28.21 kg/m2  PHYSICAL EXAM: Pt is alert and oriented, NAD HEENT: normal Neck: JVP - normal, carotids 2+= without bruits Lungs: CTA bilaterally CV: RRR without murmur or gallop Abd: soft, NT, Positive BS, no hepatomegaly Ext: no C/C/E, distal pulses intact and equal Skin: warm/dry no rash  ASSESSMENT AND PLAN: 1. Heart palpitations. Trial of metoprolol succinate 25 mg at bedtime. Patient with known normal LV function and normal coronary arteries.  2. Essential hypertension, well-controlled on current therapy. As above will add metoprolol succinate at low dose.  For follow-up I will see her in one year unless problems arise in the interim.  Tonny Bollman 08/13/2013 10:20  AM w

## 2013-08-14 ENCOUNTER — Encounter: Payer: Self-pay | Admitting: Family

## 2013-08-15 ENCOUNTER — Encounter: Payer: Self-pay | Admitting: Family

## 2013-08-15 ENCOUNTER — Ambulatory Visit (INDEPENDENT_AMBULATORY_CARE_PROVIDER_SITE_OTHER): Payer: BC Managed Care – PPO | Admitting: Family

## 2013-08-15 VITALS — BP 124/80 | HR 96 | Temp 98.1°F | Resp 16 | Ht 64.5 in | Wt 166.1 lb

## 2013-08-15 DIAGNOSIS — K589 Irritable bowel syndrome without diarrhea: Secondary | ICD-10-CM

## 2013-08-15 DIAGNOSIS — D72829 Elevated white blood cell count, unspecified: Secondary | ICD-10-CM | POA: Insufficient documentation

## 2013-08-15 DIAGNOSIS — R1013 Epigastric pain: Secondary | ICD-10-CM | POA: Insufficient documentation

## 2013-08-15 DIAGNOSIS — K760 Fatty (change of) liver, not elsewhere classified: Secondary | ICD-10-CM

## 2013-08-15 DIAGNOSIS — K7689 Other specified diseases of liver: Secondary | ICD-10-CM

## 2013-08-15 HISTORY — DX: Fatty (change of) liver, not elsewhere classified: K76.0

## 2013-08-15 LAB — CBC WITH DIFFERENTIAL/PLATELET
Basophils Absolute: 0 10*3/uL (ref 0.0–0.1)
Basophils Relative: 0 % (ref 0–1)
Eosinophils Absolute: 0 10*3/uL (ref 0.0–0.7)
Eosinophils Relative: 0 % (ref 0–5)
Lymphs Abs: 2.1 10*3/uL (ref 0.7–4.0)
MCH: 30.2 pg (ref 26.0–34.0)
MCV: 88.1 fL (ref 78.0–100.0)
Neutrophils Relative %: 73 % (ref 43–77)
Platelets: 238 10*3/uL (ref 150–400)
RBC: 4.94 MIL/uL (ref 3.87–5.11)
RDW: 14.4 % (ref 11.5–15.5)

## 2013-08-15 NOTE — Patient Instructions (Signed)
Please complete lab work prior to leaving. Schedule fasting physical after the holidays.

## 2013-08-15 NOTE — Assessment & Plan Note (Signed)
Improving on PPI. Defer management to GI.

## 2013-08-15 NOTE — Progress Notes (Signed)
Subjective:    Patient ID: Michele [name], female    DOB: [birth year], 42 y.o.   MRN: [id number]  HPI  [name] is a 42 yr old female who presents today for ED follow up. She was seen in the ED on 12/1 with chief complaint of substeranal chest pain and epigastric pain. (she has extensive hx of work up for CP which has been negative). Records are reviewed. She did undergo CT abd/pelvis which noted mild fatty liver, and constipation. Otherwise CT was unremarkable. EKG noted sinus tachycardia. She had a leukocytosis of 16.9 in ER.  She saw Dr. Excell Seltzer on 12/3. He started her on metoprolol 25 mg at bedtime for palpitations.   She also met with Dr. Loreta Ave on 12/2 and was started on Dexilant PPI. Notes some relief from the chest pain, but has some time.  Continues to have epigastric pain but this is mild.    Constipation- drinking chinese tea yesterday. Had small BM today, prior she had BM on Saturday.   Body mass index is 28.08 kg/(m^2).   Review of Systems See HPI  Past Medical History  Diagnosis Date  . Depression   . Hypertension   . Anemia     iron deficient, microcytic, hypochromic  . Anxiety   . Migraines   . 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 Fibroids     uterine  . Palpitations     recurrent  . Fatigue   . Tachycardia     unspecified  . Chest pain     atypical  . 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 Allena Katz at [facility]- S/p FNA March/april of two dominant nodules- benign.  Following for annual thyroid US with Dr. Allena Katz.    . Diabetes mellitus without complication     History   Social History  . Marital Status: Married    Spouse Name: [name]    Number of Children: 2  . Years of Education: N/A   Occupational History  . 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 Smoking status: Never Smoker   . Smokeless tobacco: Never Used  . Alcohol Use: Yes     Comment: Once a month  . Drug Use: No  . Sexual  Activity: Yes    Birth Control/ Protection: None, Surgical   Other Topics Concern  . 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 Appendectomy  03/2009  . Tubal ligation  1997  . Tuboplasty / tubotubal anastomosis  2005  . Biopsy thyroid  November 28, 2009    Dr. Allena Katz- endo  . Dilation and curettage of uterus  05/21/2011    Procedure: DILATATION AND CURETTAGE (D&C);  Surgeon: Robley Fries;  Location: [facility];  Service: Gynecology;  Laterality: N/A;  dilitation and currettage/endometrial currettings    Family History  Problem Relation Age of Onset  . Hypertension Mother   . Cancer Mother 22    breast  . Cancer Father     colon  . Stroke Brother     handicapped due to complications from spinal meningitis  . Seizures Brother   . Asthma Son   . Arthritis Maternal  Grandmother   . Hypertension Maternal Grandmother   . Stroke Maternal Grandfather     Allergies  Allergen Reactions  . Amlodipine Shortness Of Breath and Palpitations  . Bee Venom   . Wellbutrin [Bupropion Hcl] Other (See Comments)    Dizziness, increased anxiety    Current Outpatient Prescriptions on File Prior to Visit  Medication Sig Dispense Refill  . acetaminophen (TYLENOL) 500 MG tablet Take 1,000-1,500 mg by mouth every 6 (six) hours as needed. For headache      . ALPRAZolam (XANAX) 1 MG tablet Take 1 mg by mouth 4 (four) times daily as needed. For anxiety      . betamethasone dipropionate (DIPROLENE) 0.05 % cream Apply topically as needed.       Marland Kitchen dexlansoprazole (DEXILANT) 60 MG capsule Take 60 mg by mouth daily.      Marland Kitchen EPIPEN 2-PAK 0.3 MG/0.3ML SOAJ injection as needed.       . fluticasone (FLONASE) 50 MCG/ACT nasal spray Place 2 sprays into the nose as needed.      Marland Kitchen ibuprofen (ADVIL) 600 MG  tablet Take 1 tablet (600 mg total) by mouth every 6 (six) hours as needed for pain.  60 tablet  0  . losartan (COZAAR) 100 MG tablet Take 1 tablet (100 mg total) by mouth daily.  90 tablet  3  . metoprolol succinate (TOPROL-XL) 25 MG 24 hr tablet Take 1 tablet (25 mg total) by mouth at bedtime.  90 tablet  3  . oxymetazoline (AFRIN) 0.05 % nasal spray Place 2 sprays into the nose as needed.      . ranitidine (ZANTAC) 150 MG tablet Take 1 tablet (150 mg total) by mouth 2 (two) times daily.  60 tablet  0  . spironolactone (ALDACTONE) 25 MG tablet Take 1 tablet (25 mg total) by mouth daily.  30 tablet  5  . [DISCONTINUED] buPROPion (WELLBUTRIN XL) 150 MG 24 hr tablet Take 2 tablets (300 mg total) by mouth daily.  60 tablet  1   No current facility-administered medications on file prior to visit.    BP 124/80  Pulse 96  Temp(Src) 98.1 F (36.7 C) (Oral)  Resp 16  Ht 5' 4.5" (1.638 m)  Wt 166 lb 1.3 oz (75.333 kg)  BMI 28.08 kg/m2  SpO2 99%       Objective:   Physical Exam  Constitutional: She is oriented to person, place, and time. She appears well-developed and well-nourished. No distress.  HENT:  Head: Normocephalic and atraumatic.  Cardiovascular: Normal rate and regular rhythm.   No murmur heard. Pulmonary/Chest: Effort normal and breath sounds normal. No respiratory distress. She has no wheezes. She has no rales.  Musculoskeletal: She exhibits no edema.  Neurological: She is alert and oriented to person, place, and time.  Psychiatric: She has a normal mood and affect. Her behavior is normal. Judgment and thought content normal.          Assessment & Plan:

## 2013-08-15 NOTE — Progress Notes (Signed)
Pre visit review using our clinic review tool, if applicable. No additional management support is needed unless otherwise documented below in the visit note. 

## 2013-08-15 NOTE — Assessment & Plan Note (Signed)
+   constipation. Discussed use of miralax prn along with good water intake and high fiber diet.

## 2013-08-15 NOTE — Assessment & Plan Note (Signed)
Etiology unclear. Repeat CBC.

## 2013-08-19 ENCOUNTER — Ambulatory Visit (HOSPITAL_COMMUNITY)
Admission: RE | Admit: 2013-08-19 | Discharge: 2013-08-19 | Disposition: A | Discharge status: Home / Self Care | Payer: BC Managed Care – PPO | Source: Ambulatory Visit | Attending: Gastroenterology | Admitting: Gastroenterology

## 2013-08-19 ENCOUNTER — Encounter: Payer: Self-pay | Admitting: Family

## 2013-08-19 DIAGNOSIS — R1011 Right upper quadrant pain: Secondary | ICD-10-CM | POA: Insufficient documentation

## 2013-08-25 ENCOUNTER — Ambulatory Visit (HOSPITAL_COMMUNITY): Payer: BC Managed Care – PPO

## 2013-08-27 ENCOUNTER — Ambulatory Visit (HOSPITAL_BASED_OUTPATIENT_CLINIC_OR_DEPARTMENT_OTHER): Payer: BC Managed Care – PPO | Admitting: Hematology & Oncology

## 2013-08-27 ENCOUNTER — Other Ambulatory Visit (HOSPITAL_BASED_OUTPATIENT_CLINIC_OR_DEPARTMENT_OTHER): Payer: BC Managed Care – PPO | Admitting: Lab

## 2013-08-27 VITALS — BP 140/84 | HR 100 | Temp 98.0°F | Resp 14 | Ht 64.0 in | Wt 167.0 lb

## 2013-08-27 DIAGNOSIS — R5383 Other fatigue: Secondary | ICD-10-CM

## 2013-08-27 DIAGNOSIS — D508 Other iron deficiency anemias: Secondary | ICD-10-CM

## 2013-08-27 DIAGNOSIS — R5381 Other malaise: Secondary | ICD-10-CM

## 2013-08-27 DIAGNOSIS — D509 Iron deficiency anemia, unspecified: Secondary | ICD-10-CM

## 2013-08-27 LAB — CBC WITH DIFFERENTIAL (CANCER CENTER ONLY)
BASO#: 0 10*3/uL (ref 0.0–0.2)
BASO%: 0.1 % (ref 0.0–2.0)
EOS%: 0 % (ref 0.0–7.0)
HCT: 42.3 % (ref 34.8–46.6)
HGB: 14.6 g/dL (ref 11.6–15.9)
LYMPH#: 1 10*3/uL (ref 0.9–3.3)
MCHC: 34.5 g/dL (ref 32.0–36.0)
MONO#: 0.7 10*3/uL (ref 0.1–0.9)
MONO%: 5.2 % (ref 0.0–13.0)
NEUT#: 11.8 10*3/uL — ABNORMAL HIGH (ref 1.5–6.5)
Platelets: 212 10*3/uL (ref 145–400)
RDW: 13.6 % (ref 11.1–15.7)
WBC: 13.5 10*3/uL — ABNORMAL HIGH (ref 3.9–10.0)

## 2013-08-27 LAB — IRON AND TIBC CHCC
%SAT: 20 % — ABNORMAL LOW (ref 21–57)
Iron: 64 ug/dL (ref 41–142)
UIBC: 262 ug/dL (ref 120–384)

## 2013-08-27 LAB — T4: T4, Total: 7.4 ug/dL (ref 5.0–12.5)

## 2013-08-27 LAB — TSH CHCC: TSH: 0.224 m(IU)/L — ABNORMAL LOW (ref 0.308–3.960)

## 2013-08-27 NOTE — Progress Notes (Signed)
This office note has been dictated.

## 2013-08-28 ENCOUNTER — Telehealth: Payer: Self-pay | Admitting: *Deleted

## 2013-08-28 NOTE — Addendum Note (Signed)
Addended by: Arlan Organ R on: 08/28/2013 05:46 PM   Modules accepted: Orders

## 2013-08-28 NOTE — Telephone Encounter (Addendum)
Message copied by Wynonia Hazard on Thu Aug 28, 2013  2:24 PM ------      Message from: Arlan Organ R      Created: Wed Aug 27, 2013 10:14 PM       Please make sure that the T4 and TSH results get sent to her primary MD. Call the patient - she may have a low thyroid, but this is not definite. Her primary MD can sort this out.  Pete ------  1422 - Pt called with concerns she saw on my chart regarding her labs. She wants to know why her WBC, neutrophil, and ferritin counts are elevated. Gave her Dr Gustavo Lah above message and explained that her concerns would be discussed with Dr Myna Hidalgo with a return call.  1746 - Left voicemail stating Dr Myna Hidalgo feels her body is being stressed by something he can't determine at this time thus making her numbers elevated. She definitely needs to keep her appt with him in 6 weeks and see her endocrinologist ASAP. He is also going to order a ECHO to make sure there isn't anything going on with her heart to cause her fatigue. Instructed to call back tomorrow if she has any further questions or concerns.

## 2013-08-29 ENCOUNTER — Telehealth: Payer: Self-pay | Admitting: Hematology & Oncology

## 2013-08-29 NOTE — Telephone Encounter (Signed)
Pt aware of 12-22 2D Echo at [facility]

## 2013-08-29 NOTE — Progress Notes (Signed)
DIAGNOSES: 1. Recurrent iron deficiency anemia. 2. Chronic fatigue.  CURRENT THERAPY:  IV iron as indicated.  INTERIM HISTORY:  [name] comes in for followup.  She is still not doing well.  We last saw her back in October.  We gave her some IV iron back then.  At that point in time, her ferritin was 134 with an iron saturation of 22%.  Since then, she has had a spinal tap done.  She was not found to have multiple sclerosis.  She was found to have an elevated IgG level.  She was placed on acyclovir and Lyrica.  She has had CAT scans done.  She had CT of the abdomen back on August 11, 2013.  This apparently showed a "fatty liver."  We got a ultrasound of her abdomen on August 19, 2013.  This did not show any gallbladder issues.  She still feels very fatigued.  She has no energy.  She had a TSH done back in July that looked okay.  We will repeat her thyroid function.  She has not noted any rashes.  Her weight has been going up.  She is constipated.  She certainly seems to be hypothyroid.  However, we will have to check for this.  She has had no fevers.  She has had no sweating.  She has had no dysphagia or odynophagia.  PHYSICAL EXAMINATION:  General:  This is a fairly well-developed, well- nourished [place] female, in no obvious distress.  Vital Signs: Temperature of 98, pulse 100, respiratory rate 14, blood pressure 140/84, weight is 167 pounds.  Head and Neck:  Normocephalic, atraumatic skull.  There are no ocular or oral lesions.  There are no palpable cervical or supraclavicular lymph nodes.  Lungs:  Clear bilaterally. Cardiac:  Regular rate and rhythm with a normal S1 and S2.  There are no murmurs, rubs, or bruits.  Abdomen:  Soft.  She has good bowel sounds. There is no fluid wave.  There is no palpable abdominal mass.  There is no palpable hepatosplenomegaly.  Back:  No tenderness over the spine, ribs, or hips.  Extremities:  Show no clubbing,  cyanosis, or edema.  She has decent range of motion of her joints.  She has good reflexes.  LABORATORY STUDIES:  White cell count 13.5, hemoglobin 14.6, hematocrit 42.3, platelet count 212,000.  MCV is 87.  IMPRESSION:  [name] is a very nice 42 year old African female. She has iron deficiency anemia.  I would think that she would not be iron deficient right now.  Her hemoglobin is positive __________ has been in a long time.  I still cannot explain her fatigue.  I looked at her blood smear.  I did not see anything on her blood smear that looked suspicious.  I did not see any blasts.  She had no schistocytes.  There were no nucleated red blood cells.  Her white cells appear mature.  Platelets were adequate in number and size.  IMPRESSION:  [name] is a very nice 42 year old African American female.  She has recurrent iron deficiency anemia.  She really responds well to IV iron.  I just do not know what else we can do to help her out right now.  I just do not see that we have any obvious hematologic issue.  I cannot explain why she has this fatigue.  Though I did send off some thyroid function on her.  I do not see that a bone marrow biopsy needs to be  done.  The elevated white cell count certainly could be reactive.  I am trying to reassure [name] as much as I could that we would continue to try to help and cover problem that is making her so tired.  It does not sound like she is going through the "change of life." However, this may need to be evaluated.  We can leave this to her gynecologist.  I want to see her back in another 6 weeks.    ______________________________ Josph Macho, M.D. PRE/MEDQ  D:  08/27/2013  T:  08/28/2013  Job:  [id number]

## 2013-08-30 LAB — RETICULOCYTES (CHCC)
ABS Retic: 39 10*3/uL (ref 19.0–186.0)
RBC.: 4.87 MIL/uL (ref 3.87–5.11)
Retic Ct Pct: 0.8 % (ref 0.4–2.3)

## 2013-08-30 LAB — TRANSFERRIN RECEPTOR, SOLUABLE: Transferrin Receptor, Soluble: 1.3 mg/L (ref 0.76–1.76)

## 2013-09-01 ENCOUNTER — Ambulatory Visit (HOSPITAL_COMMUNITY)
Admission: RE | Admit: 2013-09-01 | Discharge: 2013-09-01 | Disposition: A | Discharge status: Home / Self Care | Payer: BC Managed Care – PPO | Source: Ambulatory Visit | Attending: Hematology & Oncology | Admitting: Hematology & Oncology

## 2013-09-01 DIAGNOSIS — I10 Essential (primary) hypertension: Secondary | ICD-10-CM | POA: Insufficient documentation

## 2013-09-01 DIAGNOSIS — R5383 Other fatigue: Secondary | ICD-10-CM

## 2013-09-01 DIAGNOSIS — R5381 Other malaise: Secondary | ICD-10-CM | POA: Insufficient documentation

## 2013-09-01 DIAGNOSIS — I059 Rheumatic mitral valve disease, unspecified: Secondary | ICD-10-CM

## 2013-09-01 DIAGNOSIS — R7303 Prediabetes: Secondary | ICD-10-CM

## 2013-09-01 DIAGNOSIS — R079 Chest pain, unspecified: Secondary | ICD-10-CM | POA: Insufficient documentation

## 2013-09-01 NOTE — Progress Notes (Signed)
Echocardiogram 2D Echocardiogram has been performed.  Dorothey Baseman 09/01/2013, 10:56 AM

## 2013-09-03 ENCOUNTER — Telehealth: Payer: Self-pay | Admitting: Nurse Practitioner

## 2013-09-03 ENCOUNTER — Ambulatory Visit (INDEPENDENT_AMBULATORY_CARE_PROVIDER_SITE_OTHER): Payer: BC Managed Care – PPO | Admitting: Family

## 2013-09-03 ENCOUNTER — Encounter: Payer: Self-pay | Admitting: Family

## 2013-09-03 ENCOUNTER — Ambulatory Visit (HOSPITAL_COMMUNITY): Payer: BC Managed Care – PPO

## 2013-09-03 VITALS — BP 132/86 | HR 107 | Temp 97.6°F | Resp 16 | Ht 64.0 in | Wt 170.2 lb

## 2013-09-03 DIAGNOSIS — H6120 Impacted cerumen, unspecified ear: Secondary | ICD-10-CM | POA: Insufficient documentation

## 2013-09-03 DIAGNOSIS — I10 Essential (primary) hypertension: Secondary | ICD-10-CM

## 2013-09-03 DIAGNOSIS — H6121 Impacted cerumen, right ear: Secondary | ICD-10-CM

## 2013-09-03 MED ORDER — PROPRANOLOL HCL 10 MG PO TABS: 10.0000 mg | ORAL_TABLET | Freq: Three times a day (TID) | ORAL | Status: DC

## 2013-09-03 NOTE — Progress Notes (Signed)
Pre visit review using our clinic review tool, if applicable. No additional management support is needed unless otherwise documented below in the visit note/SLS  

## 2013-09-03 NOTE — Progress Notes (Signed)
Subjective:    Patient ID: Michele [name], female    DOB: [birth year], 42 y.o.   MRN: [id number]  HPI  [name] is a 42 yr old female who presents today for follow up of BP.  She reports that she has been getting elevated reading at home and HR has been elevated.  She continues to have intermittent palpitations.  Yesterday, she reports BP 140/102 pulse 128.  Also reports some nasal congestion.  Review of Systems See HPI  Past Medical History  Diagnosis Date  . Depression   . Hypertension   . Anemia     iron deficient, microcytic, hypochromic  . Anxiety   . Migraines   . 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 Fibroids     uterine  . Palpitations     recurrent  . Fatigue   . Tachycardia     unspecified  . Chest pain     atypical  . 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 Allena Katz at [facility]- S/p FNA March/april of two dominant nodules- benign.  Following for annual thyroid US with Dr. Allena Katz.    . Diabetes mellitus without complication   . Fatty liver 08/15/2013    History   Social History  . Marital Status: Married    Spouse Name: Michele [name]    Number of Children: 2  . Years of Education: N/A   Occupational History  . 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 Smoking status: Never Smoker   . Smokeless tobacco: Never Used  . Alcohol Use: Yes     Comment: Once a month  . Drug Use: No  . Sexual Activity: Yes    Birth Control/ Protection: None, Surgical   Other Topics Concern  . 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 Appendectomy  03/2009  . Tubal ligation  1997  . Tuboplasty / tubotubal anastomosis  2005  . Biopsy thyroid  November 28, 2009    Dr. Allena Katz- endo  . Dilation and curettage of uterus   05/21/2011    Procedure: DILATATION AND CURETTAGE (D&C);  Surgeon: Robley Fries;  Location: [facility];  Service: Gynecology;  Laterality: N/A;  dilitation and currettage/endometrial currettings    Family History  Problem Relation Age of Onset  . Hypertension Mother   . Cancer Mother 29    breast  . Cancer Father     colon  . Stroke Brother     handicapped due to complications from spinal meningitis  . Seizures Brother   . Asthma Son   . Arthritis Maternal Grandmother   . Hypertension Maternal Grandmother   . Stroke Maternal Grandfather     Allergies  Allergen Reactions  . Amlodipine Shortness Of Breath and Palpitations  . Bee Venom   . Wellbutrin [Bupropion Hcl] Other (See Comments)    Dizziness, increased anxiety    Current Outpatient Prescriptions on File Prior to Visit  Medication Sig Dispense Refill  . acetaminophen (TYLENOL) 500 MG tablet Take 1,000-1,500 mg by mouth every 6 (six) hours as needed. For headache      . ALPRAZolam (XANAX) 1 MG tablet Take 1 mg by mouth  4 (four) times daily as needed. For anxiety      . betamethasone dipropionate (DIPROLENE) 0.05 % cream Apply topically as needed.       Marland Kitchen EPIPEN 2-PAK 0.3 MG/0.3ML SOAJ injection as needed.       . fluticasone (FLONASE) 50 MCG/ACT nasal spray Place 2 sprays into the nose as needed.      Marland Kitchen ibuprofen (ADVIL) 600 MG tablet Take 1 tablet (600 mg total) by mouth every 6 (six) hours as needed for pain.  60 tablet  0  . losartan (COZAAR) 100 MG tablet Take 1 tablet (100 mg total) by mouth daily.  90 tablet  3  . oxymetazoline (AFRIN) 0.05 % nasal spray Place 2 sprays into the nose as needed.      Marland Kitchen spironolactone (ALDACTONE) 25 MG tablet Take 1 tablet (25 mg total) by mouth daily.  30 tablet  5  . [DISCONTINUED] buPROPion (WELLBUTRIN XL) 150 MG 24 hr tablet Take 2 tablets (300 mg total) by mouth daily.  60 tablet  1   No current facility-administered medications on file prior to visit.    BP 132/86  Pulse 107   Temp(Src) 97.6 F (36.4 C) (Oral)  Resp 16  Ht 5\' 4"  (1.626 m)  Wt 170 lb 4 oz (77.225 kg)  BMI 29.21 kg/m2  SpO2 98%       Objective:   Physical Exam  Constitutional: She appears well-developed and well-nourished. No distress.  HENT:  Head: Normocephalic and atraumatic.  Right Ear: Tympanic membrane and ear canal normal.  Left Ear: Tympanic membrane and ear canal normal.  L TM was initially occluded by cerumen prior to removal with curette and lavage  Cardiovascular: Normal rate and regular rhythm.   No murmur heard. Pulmonary/Chest: Effort normal and breath sounds normal. No respiratory distress. She has no wheezes. She has no rales. She exhibits no tenderness.          Assessment & Plan:

## 2013-09-03 NOTE — Patient Instructions (Signed)
Start propranolol. Follow up on 1/17 as scheduled.

## 2013-09-03 NOTE — Assessment & Plan Note (Signed)
BP OK here today but HR is mildly elevated. She did have mildly depressed TSH, but normal T4.  She tells me that she has reviewed these results with Dr. Allena Katz her endocrinologist and that he is not concerned since her T4 is normal.  Will add propranolol for help with HR, palpitations and BP. She has had fatigue in the past on ?toprol. Will see if she can tolerate low dose propranolol.

## 2013-09-03 NOTE — Assessment & Plan Note (Signed)
Ceruminosis is noted.  Wax is removed by syringing and manual debridement.  

## 2013-09-03 NOTE — Telephone Encounter (Addendum)
Message copied by Glee Arvin on Wed Sep 03, 2013  9:41 AM ------      Message from: Josph Macho      Created: Tue Sep 02, 2013 10:24 PM       Call - echo is good!!  Heart is working well!! Cindee Lame ------LVM on pt's personal voicemail. Informed her to contact our office if she need further information.

## 2013-09-12 ENCOUNTER — Ambulatory Visit (INDEPENDENT_AMBULATORY_CARE_PROVIDER_SITE_OTHER): Payer: BC Managed Care – PPO | Admitting: Physician Assistant

## 2013-09-12 ENCOUNTER — Encounter: Payer: Self-pay | Admitting: Physician Assistant

## 2013-09-12 VITALS — BP 132/88 | HR 86 | Temp 98.4°F | Resp 16 | Ht 64.0 in | Wt 173.2 lb

## 2013-09-12 DIAGNOSIS — S40021A Contusion of right upper arm, initial encounter: Secondary | ICD-10-CM

## 2013-09-12 DIAGNOSIS — J329 Chronic sinusitis, unspecified: Secondary | ICD-10-CM

## 2013-09-12 DIAGNOSIS — S40029A Contusion of unspecified upper arm, initial encounter: Secondary | ICD-10-CM

## 2013-09-12 MED ORDER — AMOXICILLIN-POT CLAVULANATE 875-125 MG PO TABS: 1.0000 | ORAL_TABLET | Freq: Two times a day (BID) | ORAL | Status: DC

## 2013-09-12 NOTE — Patient Instructions (Signed)
Take antibiotic as prescribed with food.  Increase fluid intake.  Rest.  Saline nasal spray.  Plain Mucinex.  Place a humidifier in the bedroom.  For hematoma -- please read information below and continue to monitor.  Should resolve in a couple of weeks.    Hematoma A hematoma is a collection of blood under the skin, in an organ, in a body space, in a joint space, or in other tissue. The blood can clot to form a lump that you can see and feel. The lump is often firm and may sometimes become sore and tender. Most hematomas get better in a few days to weeks. However, some hematomas may be serious and require medical care. Hematomas can range in size from very small to very large. CAUSES  A hematoma can be caused by a blunt or penetrating injury. It can also be caused by spontaneous leakage from a blood vessel under the skin. Spontaneous leakage from a blood vessel is more likely to occur in older people, especially those taking blood thinners. Sometimes, a hematoma can develop after certain medical procedures. SIGNS AND SYMPTOMS   A firm lump on the body.  Possible pain and tenderness in the area.  Bruising.Blue, dark blue, purple-red, or yellowish skin may appear at the site of the hematoma if the hematoma is close to the surface of the skin. For hematomas in deeper tissues or body spaces, the signs and symptoms may be subtle. For example, an intra-abdominal hematoma may cause abdominal pain, weakness, fainting, and shortness of breath. An intracranial hematoma may cause a headache or symptoms such as weakness, trouble speaking, or a change in consciousness. DIAGNOSIS  A hematoma can usually be diagnosed based on your medical history and a physical exam. Imaging tests may be needed if your health care provider suspects a hematoma in deeper tissues or body spaces, such as the abdomen, head, or chest. These tests may include ultrasonography or a CT scan.  TREATMENT  Hematomas usually go away on their  own over time. Rarely does the blood need to be drained out of the body. Large hematomas or those that may affect vital organs will sometimes need surgical drainage or monitoring. HOME CARE INSTRUCTIONS   Apply ice to the injured area:   Put ice in a plastic bag.   Place a towel between your skin and the bag.   Leave the ice on for 20 minutes, 2 3 times a day for the first 1 to 2 days.   After the first 2 days, switch to using warm compresses on the hematoma.   Elevate the injured area to help decrease pain and swelling. Wrapping the area with an elastic bandage may also be helpful. Compression helps to reduce swelling and promotes shrinking of the hematoma. Make sure the bandage is not wrapped too tight.   If your hematoma is on a lower extremity and is painful, crutches may be helpful for a couple days.   Only take over-the-counter or prescription medicines as directed by your health care provider. SEEK IMMEDIATE MEDICAL CARE IF:   You have increasing pain, or your pain is not controlled with medicine.   You have a fever.   You have worsening swelling or discoloration.   Your skin over the hematoma breaks or starts bleeding.   Your hematoma is in your chest or abdomen and you have weakness, shortness of breath, or a change in consciousness.  Your hematoma is on your scalp (caused by a fall or injury)  and you have a worsening headache or a change in alertness or consciousness. MAKE SURE YOU:   Understand these instructions.  Will watch your condition.  Will get help right away if you are not doing well or get worse. Document Released: 04/11/2004 Document Revised: 04/30/2013 Document Reviewed: 02/05/2013 [facility] Patient Information 2014 [place].

## 2013-09-12 NOTE — Progress Notes (Signed)
Pre visit review using our clinic review tool, if applicable. No additional management support is needed unless otherwise documented below in the visit note/SLS  

## 2013-09-14 DIAGNOSIS — S40029A Contusion of unspecified upper arm, initial encounter: Secondary | ICD-10-CM | POA: Insufficient documentation

## 2013-09-14 DIAGNOSIS — J329 Chronic sinusitis, unspecified: Secondary | ICD-10-CM | POA: Insufficient documentation

## 2013-09-14 NOTE — Assessment & Plan Note (Signed)
Rx Augmentin. Increase fluid intake. Rest. Saline nasal spray. Probiotic. Flonase as prescribed. Humidifier in bedroom. Return to clinic if symptoms not improving.

## 2013-09-14 NOTE — Progress Notes (Signed)
Patient ID: Michele [name], female   DOB: May 26, 1971, 43 y.o.   MRN: [id number]  Patient presents to clinic today complaining of a bruise of her right anterior forearm that has been present x1 day. Patient states she can feel a little bump underneath the bruise. Patient cannot remember meeting her arm on anything. Denies other trauma or injury. Area is mildly tender. Patient denies pain or tenderness elsewhere. Denies history of easy bruising or bleeding.  Patient also complains of sinus pressure, sinus pain, postnasal drip, transient fever and dry cough has been present the past Week or more. Patient denies shortness of breath and wheezing. Denies chills, malaise or fatigue. Denies recent travel or sick contact. Patient has also started to notice tooth pain.  Past Medical History  Diagnosis Date  . Depression   . Hypertension   . Anemia     iron deficient, microcytic, hypochromic  . Anxiety   . Migraines   . 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 Fibroids     uterine  . Palpitations     recurrent  . Fatigue   . Tachycardia     unspecified  . Chest pain     atypical  . 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 Posey Pronto at [facility]- S/p FNA March/april of two dominant nodules- benign.  Following for annual thyroid US with Dr. Posey Pronto.    . Diabetes mellitus without complication   . Fatty liver 08/15/2013    Current Outpatient Prescriptions on File Prior to Visit  Medication Sig Dispense Refill  . acetaminophen (TYLENOL) 500 MG tablet Take 1,000-1,500 mg by mouth every 6 (six) hours as needed. For headache      . ALPRAZolam (XANAX) 1 MG tablet Take 1 mg by mouth 4 (four) times daily as needed. For anxiety      . betamethasone dipropionate (DIPROLENE) 0.05 % cream Apply topically as needed.       Marland Kitchen EPIPEN 2-PAK 0.3 MG/0.3ML SOAJ injection as needed.       . fluticasone (FLONASE) 50 MCG/ACT nasal spray Place 2 sprays into the nose as needed.      Marland Kitchen ibuprofen  (ADVIL) 600 MG tablet Take 1 tablet (600 mg total) by mouth every 6 (six) hours as needed for pain.  60 tablet  0  . losartan (COZAAR) 100 MG tablet Take 1 tablet (100 mg total) by mouth daily.  90 tablet  3  . oxymetazoline (AFRIN) 0.05 % nasal spray Place 2 sprays into the nose as needed.      . propranolol (INDERAL) 10 MG tablet Take 1 tablet (10 mg total) by mouth 3 (three) times daily.  90 tablet  0  . spironolactone (ALDACTONE) 25 MG tablet Take 1 tablet (25 mg total) by mouth daily.  30 tablet  5  . [DISCONTINUED] buPROPion (WELLBUTRIN XL) 150 MG 24 hr tablet Take 2 tablets (300 mg total) by mouth daily.  60 tablet  1   No current facility-administered medications on file prior to visit.    Allergies  Allergen Reactions  . Amlodipine Shortness Of Breath and Palpitations  . Bee Venom   . Wellbutrin [Bupropion Hcl] Other (See Comments)    Dizziness, increased anxiety    Family History  Problem Relation Age of Onset  . Hypertension Mother   . Cancer Mother 30    breast  . Cancer Father     colon  . Stroke Brother     handicapped due to  complications from spinal meningitis  . Seizures Brother   . Asthma Son   . Arthritis Maternal Grandmother   . Hypertension Maternal Grandmother   . Stroke Maternal Grandfather     History   Social History  . Marital Status: Married    Spouse Name: [name]    Number of Children: 2  . Years of Education: N/A   Occupational History  . 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 Smoking status: Never Smoker   . Smokeless tobacco: Never Used  . Alcohol Use: Yes     Comment: Once a month  . Drug Use: No  . Sexual Activity: Yes    Birth Control/ Protection: None, Surgical   Other Topics Concern  . 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  All other ROS are negative.  Filed Vitals:   09/12/13 1025  BP: 132/88  Pulse: 86  Temp: 98.4 F (36.9 C)  Resp: 16   Physical Exam  Vitals reviewed. Constitutional: She is oriented to person, place, and time and well-developed, well-nourished, and in no distress.  HENT:  Head: Normocephalic and atraumatic.  Right Ear: External ear normal.  Left Ear: External ear normal.  Nose: Nose normal.  Mouth/Throat: Oropharynx is clear and moist. No oropharyngeal exudate.  Tympanic membranes within normal limits bilaterally. Tenderness to percussion of sinuses noted on examination.  Eyes: Conjunctivae are normal. Pupils are equal, round, and reactive to light.  Neck: Neck supple.  Cardiovascular: Normal rate, regular rhythm, normal heart sounds and intact distal pulses.   Pulmonary/Chest: No respiratory distress. She has no wheezes. She has no rales. She exhibits no tenderness.  Lymphadenopathy:    She has no cervical adenopathy.  Neurological: She is alert and oriented to person, place, and time.  Skin: Skin is warm and dry.  Present of a 3 cm in diameter ecchymosis with underlying hematoma that is palpable on examination.  Psychiatric: Affect normal.    Recent Results (from the past 2160 hour(s))  CBC WITH DIFFERENTIAL ([facility])     Status: None   Collection Time    06/26/13 10:48 AM      Result Value Range   WBC 6.7  3.9 - 10.0 10e3/uL   RBC 4.21  3.70 - 5.32 10e6/uL   HGB 12.7  11.6 - 15.9 g/dL   HCT 37.4  34.8 - 46.6 %   MCV 89  81 - 101 fL   MCH 30.2  26.0 - 34.0 pg   MCHC 34.0  32.0 - 36.0 g/dL   RDW 12.8  11.1 - 15.7 %   Platelets 187  145 - 400 10e3/uL   NEUT# 4.1  1.5 - 6.5 10e3/uL   LYMPH# 2.1  0.9 - 3.3 10e3/uL   MONO# 0.4  0.1 - 0.9 10e3/uL   Eosinophils Absolute 0.1  0.0 - 0.5 10e3/uL   BASO# 0.0  0.0 - 0.2 10e3/uL   NEUT% 61.2  39.6 - 80.0 %   LYMPH% 31.5  14.0 - 48.0 %   MONO% 5.4  0.0 - 13.0 %   EOS% 1.7  0.0 - 7.0 %   BASO% 0.2  0.0 - 2.0 %   IRON AND TIBC [facility]  Status: None   Collection Time    06/26/13 10:48 AM      Result Value Range   Iron 64  41 - 142 ug/dL   TIBC 293  236 - 444 ug/dL   UIBC 229  120 - 384 ug/dL   %SAT 22  21 - 57 %  [facility] - SMEAR     Status: None   Collection Time    06/26/13 10:48 AM      Result Value Range   Smear Result Smear Available    FERRITIN [facility]     Status: None   Collection Time    06/26/13 10:48 AM      Result Value Range   Ferritin 134  9 - 269 ng/ml  RETICULOCYTE COUNT (SLN)     Status: None   Collection Time    06/26/13 10:48 AM      Result Value Range   Retic Ct Pct 0.9  0.4 - 2.3 %   RBC. 4.27  3.87 - 5.11 MIL/uL   ABS Retic 38.4  19.0 - 186.0 K/uL  VITAMIN B12     Status: None   Collection Time    06/26/13 12:00 PM      Result Value Range   Vitamin B-12 398  211 - 911 pg/mL  BASIC METABOLIC PANEL (QI[facility])     Status: None   Collection Time    06/26/13 12:00 PM      Result Value Range   Sodium 141  136 - 145 mEq/L   Potassium 4.7  3.5 - 5.1 mEq/L   Chloride 108  98 - 109 mEq/L   CO2 27  22 - 29 mEq/L   Glucose 87  70 - 140 mg/dl   BUN 12.2  7.0 - 26.0 mg/dL   Creatinine 0.9  0.6 - 1.1 mg/dL   Calcium 9.6  8.4 - 10.4 mg/dL   Anion Gap 7  3 - 11 mEq/L  LIPASE, BLOOD     Status: None   Collection Time    08/11/13  6:15 PM      Result Value Range   Lipase 50  11 - 59 U/L  CBC WITH DIFFERENTIAL     Status: Abnormal   Collection Time    08/11/13  6:15 PM      Result Value Range   WBC 16.9 (*) 4.0 - 10.5 K/uL   Comment: REPEATED TO VERIFY   RBC 4.99  3.87 - 5.11 MIL/uL   Hemoglobin 15.2 (*) 12.0 - 15.0 g/dL   HCT 43.5  36.0 - 46.0 %   MCV 87.2  78.0 - 100.0 fL   MCH 30.5  26.0 - 34.0 pg   MCHC 34.9  30.0 - 36.0 g/dL   RDW 13.9  11.5 - 15.5 %   Platelets 234  150 - 400 K/uL   Neutrophils Relative % 70  43 - 77 %   Lymphocytes Relative 16  12 - 46 %   Monocytes Relative 8  3 - 12 %   Eosinophils Relative 1  0 - 5 %   Basophils Relative 0  0 - 1 %    Band Neutrophils 2  0 - 10 %   Metamyelocytes Relative 3     Myelocytes 0     Promyelocytes Absolute 0     Blasts 0     nRBC 0  0 /100 WBC   Neutro Abs 12.6 (*) 1.7 - 7.7 K/uL   Lymphs Abs 2.7  0.7 - 4.0 K/uL   Monocytes Absolute 1.4 (*) 0.1 - 1.0 K/uL   Eosinophils Absolute 0.2  0.0 - 0.7 K/uL   Basophils Absolute 0.0  0.0 - 0.1 K/uL   WBC Morphology MILD LEFT SHIFT (1-5% METAS, OCC MYELO, OCC BANDS)     Comment: TOXIC GRANULATION     WHITE COUNT CONFIRMED ON SMEAR  BASIC METABOLIC PANEL     Status: Abnormal   Collection Time    08/11/13  6:15 PM      Result Value Range   Sodium 137  135 - 145 mEq/L   Potassium 4.6  3.5 - 5.1 mEq/L   Chloride 97  96 - 112 mEq/L   CO2 30  19 - 32 mEq/L   Glucose, Bld 101 (*) 70 - 99 mg/dL   BUN 21  6 - 23 mg/dL   Creatinine, Ser 1.00  0.50 - 1.10 mg/dL   Calcium 10.6 (*) 8.4 - 10.5 mg/dL   GFR calc non Af Amer 68 (*) >90 mL/min   GFR calc Af Amer 79 (*) >90 mL/min   Comment: (NOTE)     The eGFR has been calculated using the CKD EPI equation.     This calculation has not been validated in all clinical situations.     eGFR's persistently <90 mL/min signify possible Chronic Kidney     Disease.  TROPONIN I     Status: None   Collection Time    08/11/13  6:15 PM      Result Value Range   Troponin I <0.30  <0.30 ng/mL   Comment:            Due to the release kinetics of cTnI,     a negative result within the first hours     of the onset of symptoms does not rule out     myocardial infarction with certainty.     If myocardial infarction is still suspected,     repeat the test at appropriate intervals.  HEPATIC FUNCTION PANEL     Status: None   Collection Time    08/11/13  6:15 PM      Result Value Range   Total Protein 8.0  6.0 - 8.3 g/dL   Albumin 4.3  3.5 - 5.2 g/dL   AST 13  0 - 37 U/L   ALT 25  0 - 35 U/L   Alkaline Phosphatase 65  39 - 117 U/L   Total Bilirubin 0.7  0.3 - 1.2 mg/dL   Bilirubin, Direct 0.1  0.0 - 0.3 mg/dL    Indirect Bilirubin 0.6  0.3 - 0.9 mg/dL  URINALYSIS, ROUTINE W REFLEX MICROSCOPIC     Status: None   Collection Time    08/11/13  7:35 PM      Result Value Range   Color, Urine YELLOW  YELLOW   APPearance CLEAR  CLEAR   Specific Gravity, Urine 1.016  1.005 - 1.030   pH 6.0  5.0 - 8.0   Glucose, UA NEGATIVE  NEGATIVE mg/dL   Hgb urine dipstick NEGATIVE  NEGATIVE   Bilirubin Urine NEGATIVE  NEGATIVE   Ketones, ur NEGATIVE  NEGATIVE mg/dL   Protein, ur NEGATIVE  NEGATIVE mg/dL   Urobilinogen, UA 0.2  0.0 - 1.0 mg/dL   Nitrite NEGATIVE  NEGATIVE   Leukocytes, UA NEGATIVE  NEGATIVE   Comment: MICROSCOPIC NOT DONE ON URINES WITH NEGATIVE PROTEIN, BLOOD, LEUKOCYTES, NITRITE, OR GLUCOSE <1000 mg/dL.  CBC WITH DIFFERENTIAL  Status: Abnormal   Collection Time    08/15/13 11:01 AM      Result Value Range   WBC 10.9 (*) 4.0 - 10.5 K/uL   RBC 4.94  3.87 - 5.11 MIL/uL   Hemoglobin 14.9  12.0 - 15.0 g/dL   HCT 43.5  36.0 - 46.0 %   MCV 88.1  78.0 - 100.0 fL   MCH 30.2  26.0 - 34.0 pg   MCHC 34.3  30.0 - 36.0 g/dL   RDW 14.4  11.5 - 15.5 %   Platelets 238  150 - 400 K/uL   Neutrophils Relative % 73  43 - 77 %   Neutro Abs 7.9 (*) 1.7 - 7.7 K/uL   Lymphocytes Relative 19  12 - 46 %   Lymphs Abs 2.1  0.7 - 4.0 K/uL   Monocytes Relative 8  3 - 12 %   Monocytes Absolute 0.8  0.1 - 1.0 K/uL   Eosinophils Relative 0  0 - 5 %   Eosinophils Absolute 0.0  0.0 - 0.7 K/uL   Basophils Relative 0  0 - 1 %   Basophils Absolute 0.0  0.0 - 0.1 K/uL   Smear Review Criteria for review not met    CBC WITH DIFFERENTIAL ([facility])     Status: Abnormal   Collection Time    08/27/13  1:44 PM      Result Value Range   WBC 13.5 (*) 3.9 - 10.0 10e3/uL   RBC 4.87  3.70 - 5.32 10e6/uL   HGB 14.6  11.6 - 15.9 g/dL   HCT 42.3  34.8 - 46.6 %   MCV 87  81 - 101 fL   MCH 30.0  26.0 - 34.0 pg   MCHC 34.5  32.0 - 36.0 g/dL   RDW 13.6  11.1 - 15.7 %   Platelets 212  145 - 400 10e3/uL   NEUT# 11.8 (*) 1.5  - 6.5 10e3/uL   LYMPH# 1.0  0.9 - 3.3 10e3/uL   MONO# 0.7  0.1 - 0.9 10e3/uL   Eosinophils Absolute 0.0  0.0 - 0.5 10e3/uL   BASO# 0.0  0.0 - 0.2 10e3/uL   NEUT% 87.5 (*) 39.6 - 80.0 %   LYMPH% 7.2 (*) 14.0 - 48.0 %   MONO% 5.2  0.0 - 13.0 %   EOS% 0.0  0.0 - 7.0 %   BASO% 0.1  0.0 - 2.0 %  TRANSFERRIN RECEPTOR, SOLUABLE     Status: None   Collection Time    08/27/13  1:44 PM      Result Value Range   Transferrin Receptor, Soluble 1.30  0.76 - 1.76 mg/L   Comment: Test performed by:           [facility]           [address] [place] [address]           Phone:  [phone]                   [phone] Director: Rebeca Alert. Leandro Reasoner, M.D., [place] Reported by      [facility] [address], [place], [place] [address]Kenneth Joie Bimler, M.D., Ph.D., Director of [phone], CLIA [phone]  RETICULOCYTE COUNT (SLN)     Status: None   Collection Time    08/27/13  1:44 PM      Result Value Range   Retic Ct  Pct 0.8  0.4 - 2.3 %   RBC. 4.87  3.87 - 5.11 MIL/uL   ABS Retic 39.0  19.0 - 186.0 K/uL  [facility] - SMEAR     Status: None   Collection Time    08/27/13  1:44 PM      Result Value Range   Smear Result Smear Available    FERRITIN [facility]     Status: Abnormal   Collection Time    08/27/13  1:44 PM      Result Value Range   Ferritin 635 (*) 9 - 269 ng/ml  IRON AND TIBC [facility]     Status: Abnormal   Collection Time    08/27/13  1:44 PM      Result Value Range   Iron 64  41 - 142 ug/dL   TIBC 326  236 - 444 ug/dL   UIBC 262  120 - 384 ug/dL   %SAT 20 (*) 21 - 57 %  T4     Status: None   Collection Time    08/27/13  3:07 PM      Result Value Range   T4, Total 7.4  5.0 - 12.5 ug/dL  TSH [facility]     Status: Abnormal   Collection Time    08/27/13  3:07 PM      Result Value Range   TSH 0.224 (*) 0.308 - 3.960 m(IU)/L    Assessment/Plan: No problem-specific assessment &  plan notes found for this encounter.

## 2013-09-14 NOTE — Assessment & Plan Note (Signed)
Ibuprofen for pain. Ice pack applied to area. Patient instructed that bruising will take several weeks to dissipate. Patient given handout on hematoma.

## 2013-09-29 ENCOUNTER — Encounter: Payer: BC Managed Care – PPO | Admitting: Family

## 2013-10-08 ENCOUNTER — Encounter: Payer: Self-pay | Admitting: Family

## 2013-10-08 ENCOUNTER — Encounter: Payer: Self-pay | Admitting: Hematology & Oncology

## 2013-10-08 ENCOUNTER — Ambulatory Visit (INDEPENDENT_AMBULATORY_CARE_PROVIDER_SITE_OTHER): Payer: BC Managed Care – PPO | Admitting: Family

## 2013-10-08 ENCOUNTER — Ambulatory Visit (HOSPITAL_BASED_OUTPATIENT_CLINIC_OR_DEPARTMENT_OTHER): Payer: BC Managed Care – PPO | Admitting: Hematology & Oncology

## 2013-10-08 ENCOUNTER — Other Ambulatory Visit (HOSPITAL_BASED_OUTPATIENT_CLINIC_OR_DEPARTMENT_OTHER): Payer: BC Managed Care – PPO | Admitting: Lab

## 2013-10-08 VITALS — BP 116/80 | HR 69 | Temp 97.8°F | Resp 16 | Ht 65.0 in | Wt 178.0 lb

## 2013-10-08 VITALS — BP 119/79 | HR 82 | Temp 98.1°F | Resp 14 | Ht 64.0 in | Wt 179.0 lb

## 2013-10-08 DIAGNOSIS — R5381 Other malaise: Secondary | ICD-10-CM

## 2013-10-08 DIAGNOSIS — IMO0001 Reserved for inherently not codable concepts without codable children: Secondary | ICD-10-CM

## 2013-10-08 DIAGNOSIS — N6320 Unspecified lump in the left breast, unspecified quadrant: Secondary | ICD-10-CM

## 2013-10-08 DIAGNOSIS — R253 Fasciculation: Secondary | ICD-10-CM

## 2013-10-08 DIAGNOSIS — N63 Unspecified lump in unspecified breast: Secondary | ICD-10-CM

## 2013-10-08 DIAGNOSIS — N920 Excessive and frequent menstruation with regular cycle: Secondary | ICD-10-CM

## 2013-10-08 DIAGNOSIS — D508 Other iron deficiency anemias: Secondary | ICD-10-CM

## 2013-10-08 DIAGNOSIS — M6281 Muscle weakness (generalized): Secondary | ICD-10-CM

## 2013-10-08 DIAGNOSIS — R259 Unspecified abnormal involuntary movements: Secondary | ICD-10-CM

## 2013-10-08 DIAGNOSIS — R5383 Other fatigue: Secondary | ICD-10-CM

## 2013-10-08 DIAGNOSIS — Z0000 Encounter for general adult medical examination without abnormal findings: Secondary | ICD-10-CM

## 2013-10-08 DIAGNOSIS — L659 Nonscarring hair loss, unspecified: Secondary | ICD-10-CM

## 2013-10-08 DIAGNOSIS — R635 Abnormal weight gain: Secondary | ICD-10-CM

## 2013-10-08 DIAGNOSIS — D509 Iron deficiency anemia, unspecified: Secondary | ICD-10-CM

## 2013-10-08 LAB — CBC WITH DIFFERENTIAL (CANCER CENTER ONLY)
BASO#: 0 10*3/uL (ref 0.0–0.2)
BASO%: 0.2 % (ref 0.0–2.0)
EOS%: 0.3 % (ref 0.0–7.0)
Eosinophils Absolute: 0 10*3/uL (ref 0.0–0.5)
HEMATOCRIT: 39.9 % (ref 34.8–46.6)
HGB: 13.6 g/dL (ref 11.6–15.9)
LYMPH#: 1.9 10*3/uL (ref 0.9–3.3)
LYMPH%: 28.2 % (ref 14.0–48.0)
MCH: 30 pg (ref 26.0–34.0)
MCHC: 34.1 g/dL (ref 32.0–36.0)
MCV: 88 fL (ref 81–101)
MONO#: 0.6 10*3/uL (ref 0.1–0.9)
MONO%: 9.7 % (ref 0.0–13.0)
NEUT#: 4.1 10*3/uL (ref 1.5–6.5)
NEUT%: 61.6 % (ref 39.6–80.0)
Platelets: 214 10*3/uL (ref 145–400)
RBC: 4.53 10*6/uL (ref 3.70–5.32)
RDW: 14.4 % (ref 11.1–15.7)
WBC: 6.6 10*3/uL (ref 3.9–10.0)

## 2013-10-08 LAB — SEDIMENTATION RATE
Sed Rate: 11 mm/hr (ref 0–22)
Sed Rate: 11 mm/hr (ref 0–22)

## 2013-10-08 LAB — CHCC SATELLITE - SMEAR

## 2013-10-08 NOTE — Progress Notes (Signed)
Pre visit review using our clinic review tool, if applicable. No additional management support is needed unless otherwise documented below in the visit note. 

## 2013-10-08 NOTE — Progress Notes (Signed)
Subjective:    Patient ID: Michele [name], female    DOB: 1971/05/13, 43 y.o.   MRN: [id number]  HPI  Patient presents today for complete physical.  Immunizations: declines flu, tetanus up to date Diet: fair diet Exercise:not exercising much due to fatigue.  Pap Smear: sees Dr. Benjie Karvonen- will arrange follow up.  Mammogram: up to date  Wt Readings from Last 3 Encounters:  10/08/13 178 lb (80.74 kg)  09/12/13 173 lb 4 oz (78.586 kg)  09/03/13 170 lb 4 oz (77.225 kg)   She reports that she recently saw neurology- Dr. Trula Ore- did EMG's and told her that she does not have MS.  Reports that she has intermittent muscle fasciculations which have  worsened over the last 30 days.  Has apt for follow up with neuro on 2/25.  Review of Systems  Musculoskeletal:       Myalgia in legs/arms  Neurological:       Reports muscle twitching, "butt, arm, under eye."   Past Medical History  Diagnosis Date  . Depression   . Hypertension   . Anemia     iron deficient, microcytic, hypochromic  . Anxiety   . Migraines   . 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 Fibroids     uterine  . Palpitations     recurrent  . Fatigue   . Tachycardia     unspecified  . Chest pain     atypical  . 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 Posey Pronto at [facility]- S/p FNA March/april of two dominant nodules- benign.  Following for annual thyroid US with Dr. Posey Pronto.    . Diabetes mellitus without complication   . Fatty liver 08/15/2013    History   Social History  . Marital Status: Married    Spouse Name: [name]    Number of Children: 2  . Years of Education: N/A   Occupational History  . 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 Smoking status: Never Smoker   . Smokeless tobacco: Never Used  . Alcohol Use: Yes     Comment: Once a month  . Drug Use: No  . Sexual Activity: Yes    Birth Control/ Protection: None, Surgical   Other Topics Concern  . 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 Appendectomy  03/2009  . Tubal ligation  1997  . Tuboplasty / tubotubal anastomosis  2005  . Biopsy thyroid  November 28, 2009    Dr. Posey Pronto- endo  . Dilation and curettage of uterus  05/21/2011    Procedure: DILATATION AND CURETTAGE (D&C);  Surgeon: Elveria Royals;  Location: [facility];  Service: Gynecology;  Laterality: N/A;  dilitation and currettage/endometrial currettings    Family History  Problem Relation Age of Onset  . Hypertension Mother   . Cancer Mother 58    breast  . Cancer Father     colon  . Stroke Brother     handicapped due to complications from spinal meningitis  . Seizures Brother   . Asthma Son   . Arthritis Maternal Grandmother   . Hypertension Maternal Grandmother   . Stroke Maternal Grandfather  Allergies  Allergen Reactions  . Amlodipine Shortness Of Breath and Palpitations  . Bee Venom   . Wellbutrin [Bupropion Hcl] Other (See Comments)    Dizziness, increased anxiety    Current Outpatient Prescriptions on File Prior to Visit  Medication Sig Dispense Refill  . acetaminophen (TYLENOL) 500 MG tablet Take 1,000-1,500 mg by mouth every 6 (six) hours as needed. For headache      . ALPRAZolam (XANAX) 1 MG tablet Take 1 mg by mouth 4 (four) times daily as needed. For anxiety      . betamethasone dipropionate (DIPROLENE) 0.05 % cream Apply topically as needed.       Marland Kitchen EPIPEN 2-PAK 0.3 MG/0.3ML SOAJ injection as needed.       . fluticasone (FLONASE) 50 MCG/ACT nasal spray Place 2 sprays into the nose as needed.      Marland Kitchen ibuprofen (ADVIL) 600 MG tablet Take 1 tablet (600 mg total) by mouth every 6 (six) hours as needed for pain.  60 tablet  0  . losartan (COZAAR) 100 MG tablet Take 1 tablet (100 mg total) by mouth  daily.  90 tablet  3  . oxymetazoline (AFRIN) 0.05 % nasal spray Place 2 sprays into the nose as needed.      . propranolol (INDERAL) 10 MG tablet Take 1 tablet (10 mg total) by mouth 3 (three) times daily.  90 tablet  0  . spironolactone (ALDACTONE) 25 MG tablet Take 1 tablet (25 mg total) by mouth daily.  30 tablet  5  . [DISCONTINUED] buPROPion (WELLBUTRIN XL) 150 MG 24 hr tablet Take 2 tablets (300 mg total) by mouth daily.  60 tablet  1   No current facility-administered medications on file prior to visit.    BP 116/80  Pulse 69  Temp(Src) 97.8 F (36.6 C) (Oral)  Resp 16  Ht 5\' 5"  (1.651 m)  Wt 178 lb (80.74 kg)  BMI 29.62 kg/m2  SpO2 99%       Objective:   Physical Exam  Physical Exam  Constitutional: She is oriented to person, place, and time. She appears well-developed and well-nourished. No distress.  HENT:  Head: Normocephalic and atraumatic.  Right Ear: Tympanic membrane and ear canal normal.  Left Ear: Tympanic membrane and ear canal normal.  Mouth/Throat: Oropharynx is clear and moist.  Eyes: Pupils are equal, round, and reactive to light. No scleral icterus.  Neck: Normal range of motion. No thyromegaly present.  Cardiovascular: Normal rate and regular rhythm.   No murmur heard. Pulmonary/Chest: Effort normal and breath sounds normal. No respiratory distress. He has no wheezes. She has no rales. She exhibits no tenderness.  Abdominal: Soft. Bowel sounds are normal. He exhibits no distension and no mass. There is no tenderness. There is no rebound and no guarding.  Musculoskeletal: She exhibits no edema.  Lymphadenopathy:    She has no cervical adenopathy.  Neurological: She is alert and oriented to person, place, and time.She exhibits normal muscle tone. Coordination normal.  Skin: Skin is warm and dry.  Psychiatric: She has a normal mood and affect. Her behavior is normal. Judgment and thought content normal.  Breasts: Examined lying Right: Without masses,  retractions, discharge or axillary adenopathy.  Left: Some thickening/mobile mass noted L breast around 10 oclock  Pelvic: deferred         Assessment & Plan:         Assessment & Plan:

## 2013-10-08 NOTE — Progress Notes (Signed)
This office note has been dictated.

## 2013-10-08 NOTE — Patient Instructions (Signed)
Please complete lab work prior to leaving. You will be contacted about your mammogram. Please follow up in 3-4 months, sooner if problems/concerns.

## 2013-10-09 ENCOUNTER — Other Ambulatory Visit: Payer: BC Managed Care – PPO | Admitting: Lab

## 2013-10-09 DIAGNOSIS — N6320 Unspecified lump in the left breast, unspecified quadrant: Secondary | ICD-10-CM | POA: Insufficient documentation

## 2013-10-09 DIAGNOSIS — Z0000 Encounter for general adult medical examination without abnormal findings: Secondary | ICD-10-CM | POA: Insufficient documentation

## 2013-10-09 DIAGNOSIS — R253 Fasciculation: Secondary | ICD-10-CM | POA: Insufficient documentation

## 2013-10-09 LAB — BASIC METABOLIC PANEL WITH GFR
BUN: 8 mg/dL (ref 6–23)
CHLORIDE: 104 meq/L (ref 96–112)
CO2: 31 meq/L (ref 19–32)
Calcium: 9.6 mg/dL (ref 8.4–10.5)
Creat: 0.64 mg/dL (ref 0.50–1.10)
GFR, Est African American: >89 mL/min
GFR, Est Non African American: >89 mL/min
Glucose, Bld: 99 mg/dL (ref 70–99)
Potassium: 4.8 mEq/L (ref 3.5–5.3)
SODIUM: 139 meq/L (ref 135–145)

## 2013-10-09 LAB — IRON AND TIBC CHCC
%SAT: 31 % (ref 21–57)
IRON: 84 ug/dL (ref 41–142)
TIBC: 273 ug/dL (ref 236–444)
UIBC: 188 ug/dL (ref 120–384)

## 2013-10-09 LAB — URINALYSIS, ROUTINE W REFLEX MICROSCOPIC
Bilirubin Urine: NEGATIVE
Glucose, UA: NEGATIVE mg/dL
HGB URINE DIPSTICK: NEGATIVE
Ketones, ur: NEGATIVE mg/dL
LEUKOCYTES UA: NEGATIVE
Nitrite: NEGATIVE
PROTEIN: NEGATIVE mg/dL
Specific Gravity, Urine: 1.006 (ref 1.005–1.030)
Urobilinogen, UA: 0.2 mg/dL (ref 0.0–1.0)
pH: 6 (ref 5.0–8.0)

## 2013-10-09 LAB — LIPID PANEL
CHOLESTEROL: 170 mg/dL (ref 0–200)
HDL: 42 mg/dL (ref >39)
LDL CALC: 115 mg/dL — AB (ref 0–99)
Total CHOL/HDL Ratio: 4 Ratio
Triglycerides: 63 mg/dL (ref <150)
VLDL: 13 mg/dL (ref 0–40)

## 2013-10-09 LAB — HEPATIC FUNCTION PANEL
ALK PHOS: 67 U/L (ref 39–117)
ALT: 14 U/L (ref 0–35)
AST: 15 U/L (ref 0–37)
Albumin: 4.4 g/dL (ref 3.5–5.2)
BILIRUBIN DIRECT: 0.2 mg/dL (ref 0.0–0.3)
Indirect Bilirubin: 0.8 mg/dL (ref 0.2–1.2)
Total Bilirubin: 1 mg/dL (ref 0.2–1.2)
Total Protein: 6.8 g/dL (ref 6.0–8.3)

## 2013-10-09 LAB — FERRITIN CHCC: FERRITIN: 430 ng/mL — AB (ref 9–269)

## 2013-10-09 LAB — ANA: ANA: NEGATIVE

## 2013-10-09 LAB — RHEUMATOID FACTOR

## 2013-10-09 LAB — TSH: TSH: 0.707 u[IU]/mL (ref 0.350–4.500)

## 2013-10-09 NOTE — Progress Notes (Signed)
DIAGNOSES: 1. Recurrent iron-deficiency anemia. 2. Chronic fatigue.  CURRENT THERAPY:  IV iron as indicated.  INTERIM HISTORY:  [name] comes in for followup.  Unfortunately, she is still not doing well.  She is feeling worse.  She feels more tired.  We last saw her back in December.  At that point in time, her ferritin was 635, but iron saturation 120%.  Her total iron was 64.  She still has no energy.  She says that her muscles hurt and ache.  She has some burning sensation with her muscles.  We last gave her iron back in late October.  She has been seen by the doctors.  They cannot seem to find out what is wrong with her.  She did have an echocardiogram done.  This really did not show any problems with her heart.  She had thyroid studies done.  She had a very low TSH.  She saw an endocrinologist who do not think that there is any problems.  She has been tested for collagen vascular disease and would not found anything with this.  She had a CT scan of the abdomen and pelvis, back in early December. Again, this did not show any obvious issues.  She may have had some hepatic steatosis.  There has been no weight loss.  She keeps gaining weight.  She has had no rashes.  There has been no swollen lymph nodes.  She says that she just hurts all the time.  She just gets very fatigued.  I do not know if she has polymyalgia.  I do not know if she has chronic fatigue syndrome.  There has been no change in her medications from what I can tell.  PHYSICAL EXAMINATION:  General:  This is a slightly ill-appearing African American female in no obvious distress.  Vital Signs: Temperature 98.1, pulse 82, respiratory rate 14, blood pressure 119/79, weight is 179 pounds.  Head and Neck:  Normocephalic, atraumatic skull. There are no ocular or oral lesions.  There are no palpable cervical or supraclavicular lymph nodes.  Thyroid is nonpalpable.  Lungs:  Clear bilaterally.   Cardiac:  Regular rate and rhythm with a normal S1 and S2. There are no murmurs, rubs, or bruits.  Abdomen:  Soft.  She has good bowel sounds.  There is no palpable abdominal mass.  There is no palpable hepatosplenomegaly.  Back:  No tenderness over the spine, ribs, or hips.  Extremities:  No clubbing, cyanosis, or edema.  She has good range motion of her joints.  There is no joint erythema or warmth or swelling.  Skin:  No rashes, ecchymosis, or petechia.  Neurological:  No focal neurological deficits.  LABORATORY STUDIES:  White cell count 6.6, hemoglobin 13.6, hematocrit 39.9, platelet count 214.  MCV is 88.  IMPRESSION:  [name] is a 43 year old African American female.  We have seen her for iron deficiency anemia.  I am not sure what is going on with her right now.  The more we have to think this is much more than iron deficiency.  I want to check her for zinc deficiency.  She says that she lose no hair.  We will check her for vitamin deficiency.  I will also see about creatine kinase.  I do not know if she has some kind of myositis causing all this muscle pain.  I will also do a heavy metal screen on her.  We may want to consider 24-hour urine.  I do not think  she has porphyria or anything along those lines.  I know this is very frustrating for [name].  I am just not sure what else we can do aside these other test.  We will get these tests on her tomorrow.  It would probably take couple weeks to get the results back.  I will then plan to get her back and see how we can try to help her out.  I spent a good 40 minutes or so with her today.  I just feel bad that she feels bad.    ______________________________ Volanda Napoleon, M.D. PRE/MEDQ  D:  10/08/2013  T:  10/09/2013  Job:  [id number]

## 2013-10-09 NOTE — Assessment & Plan Note (Addendum)
Advised her to discuss with neuro at her upcoming appointment. Obtain ANA, RA, ESR due to concern re: myalgias.  Fibromyalgia is a possibility.

## 2013-10-09 NOTE — Assessment & Plan Note (Signed)
Discussed healthy diet, exercise, weight loss.  Obtain fasting labs

## 2013-10-09 NOTE — Assessment & Plan Note (Signed)
Refer for left diagnostic mammo and ultrasound

## 2013-10-12 ENCOUNTER — Encounter: Payer: Self-pay | Admitting: Family

## 2013-10-14 ENCOUNTER — Other Ambulatory Visit: Payer: BC Managed Care – PPO

## 2013-10-14 LAB — HEAVY METALS, BLOOD
Arsenic: <3 mcg/L (ref <23)
Mercury, B: <4 mcg/L (ref <=10)

## 2013-10-14 LAB — VITAMIN A: VITAMIN A (RETINOIC ACID): 72 ug/dL (ref 38–98)

## 2013-10-14 LAB — CK: CK TOTAL: 84 U/L (ref 7–177)

## 2013-10-14 LAB — ZINC: ZINC: 78 ug/dL (ref 60–130)

## 2013-10-20 ENCOUNTER — Telehealth: Payer: Self-pay | Admitting: Family

## 2013-10-20 MED ORDER — PROPRANOLOL HCL 10 MG PO TABS: 10.0000 mg | ORAL_TABLET | Freq: Three times a day (TID) | ORAL | Status: DC

## 2013-10-20 NOTE — Telephone Encounter (Signed)
Patient states that she is out of propanalol and would like a refill sent to [facility]. She states that she called the pharmacy last week regarding this.

## 2013-10-20 NOTE — Telephone Encounter (Signed)
Refill sent. Notified pt. 

## 2013-10-22 ENCOUNTER — Ambulatory Visit
Admission: RE | Admit: 2013-10-22 | Discharge: 2013-10-22 | Disposition: A | Discharge status: Home / Self Care | Payer: BC Managed Care – PPO | Source: Ambulatory Visit | Attending: Family | Admitting: Family

## 2013-10-22 DIAGNOSIS — N63 Unspecified lump in unspecified breast: Secondary | ICD-10-CM

## 2013-11-03 ENCOUNTER — Telehealth: Payer: Self-pay | Admitting: Hematology & Oncology

## 2013-11-03 NOTE — Telephone Encounter (Signed)
Pt moved 2-26 to 3-24

## 2013-11-06 ENCOUNTER — Ambulatory Visit: Payer: BC Managed Care – PPO | Admitting: Hematology & Oncology

## 2013-11-06 ENCOUNTER — Other Ambulatory Visit: Payer: BC Managed Care – PPO | Admitting: Lab

## 2013-11-11 ENCOUNTER — Other Ambulatory Visit: Payer: Self-pay

## 2013-11-11 DIAGNOSIS — Z1231 Encounter for screening mammogram for malignant neoplasm of breast: Secondary | ICD-10-CM

## 2013-11-13 ENCOUNTER — Telehealth: Payer: Self-pay | Admitting: Family

## 2013-11-13 MED ORDER — SPIRONOLACTONE 25 MG PO TABS: 25.0000 mg | ORAL_TABLET | Freq: Every day | ORAL | Status: DC

## 2013-11-13 NOTE — Telephone Encounter (Signed)
Request refill on spironolactone (ALDACTONE) 25 MG tablet, request done today. Pt is out of meds. [facility] [address] [facility] Pt is requesting call once sent

## 2013-11-18 ENCOUNTER — Encounter: Payer: Self-pay | Admitting: Family Medicine

## 2013-11-18 ENCOUNTER — Ambulatory Visit (INDEPENDENT_AMBULATORY_CARE_PROVIDER_SITE_OTHER): Payer: BC Managed Care – PPO | Admitting: Family Medicine

## 2013-11-18 VITALS — BP 100/70 | HR 96 | Temp 98.6°F | Ht 65.0 in | Wt 185.0 lb

## 2013-11-18 DIAGNOSIS — J069 Acute upper respiratory infection, unspecified: Secondary | ICD-10-CM

## 2013-11-18 DIAGNOSIS — J111 Influenza due to unidentified influenza virus with other respiratory manifestations: Secondary | ICD-10-CM

## 2013-11-18 LAB — POCT INFLUENZA A/B
Influenza A, POC: NEGATIVE
Influenza B, POC: POSITIVE

## 2013-11-18 MED ORDER — BENZONATATE 100 MG PO CAPS: 100.0000 mg | ORAL_CAPSULE | Freq: Three times a day (TID) | ORAL | Status: DC | PRN

## 2013-11-18 MED ORDER — PROMETHAZINE HCL 25 MG PO TABS: 25.0000 mg | ORAL_TABLET | Freq: Three times a day (TID) | ORAL | Status: DC | PRN

## 2013-11-18 MED ORDER — HYDROCODONE-HOMATROPINE 5-1.5 MG/5ML PO SYRP: 5.0000 mL | ORAL_SOLUTION | Freq: Three times a day (TID) | ORAL | Status: DC | PRN

## 2013-11-18 MED ORDER — OSELTAMIVIR PHOSPHATE 75 MG PO CAPS: 75.0000 mg | ORAL_CAPSULE | Freq: Two times a day (BID) | ORAL | Status: DC

## 2013-11-18 NOTE — Patient Instructions (Addendum)
Warm tea with lemon and honey Probiotic daily such as Digestive Advantage  Zinc such as Coldeeze or Xicam daily Increase fluids to 64 oz plus daily Influenza, Adult Influenza ("the flu") is a viral infection of the respiratory tract. It occurs more often in winter months because people spend more time in close contact with one another. Influenza can make you feel very sick. Influenza easily spreads from person to person (contagious). CAUSES  Influenza is caused by a virus that infects the respiratory tract. You can catch the virus by breathing in droplets from an infected person's cough or sneeze. You can also catch the virus by touching something that was recently contaminated with the virus and then touching your mouth, nose, or eyes. SYMPTOMS  Symptoms typically last 4 to 10 days and may include:  Fever.  Chills.  Headache, body aches, and muscle aches.  Sore throat.  Chest discomfort and cough.  Poor appetite.  Weakness or feeling tired.  Dizziness.  Nausea or vomiting. DIAGNOSIS  Diagnosis of influenza is often made based on your history and a physical exam. A nose or throat swab test can be done to confirm the diagnosis. RISKS AND COMPLICATIONS You may be at risk for a more severe case of influenza if you smoke cigarettes, have diabetes, have chronic heart disease (such as heart failure) or lung disease (such as asthma), or if you have a weakened immune system. Elderly people and pregnant women are also at risk for more serious infections. The most common complication of influenza is a lung infection (pneumonia). Sometimes, this complication can require emergency medical care and may be life-threatening. PREVENTION  An annual influenza vaccination (flu shot) is the best way to avoid getting influenza. An annual flu shot is now routinely recommended for all adults in the [place] TREATMENT  In mild cases, influenza goes away on its own. Treatment is directed at relieving symptoms.  For more severe cases, your caregiver may prescribe antiviral medicines to shorten the sickness. Antibiotic medicines are not effective, because the infection is caused by a virus, not by bacteria. HOME CARE INSTRUCTIONS  Only take over-the-counter or prescription medicines for pain, discomfort, or fever as directed by your caregiver.  Use a cool mist humidifier to make breathing easier.  Get plenty of rest until your temperature returns to normal. This usually takes 3 to 4 days.  Drink enough fluids to keep your urine clear or pale yellow.  Cover your mouth and nose when coughing or sneezing, and wash your hands well to avoid spreading the virus.  Stay home from work or school until your fever has been gone for at least 1 full day. SEEK MEDICAL CARE IF:   You have chest pain or a deep cough that worsens or produces more mucus.  You have nausea, vomiting, or diarrhea. SEEK IMMEDIATE MEDICAL CARE IF:   You have difficulty breathing, shortness of breath, or your skin or nails turn bluish.  You have severe neck pain or stiffness.  You have a severe headache, facial pain, or earache.  You have a worsening or recurring fever.  You have nausea or vomiting that cannot be controlled. MAKE SURE YOU:  Understand these instructions.  Will watch your condition.  Will get help right away if you are not doing well or get worse. Document Released: 08/25/2000 Document Revised: 02/27/2012 Document Reviewed: 11/27/2011 [facility] Patient Information 2014 [place], [place].

## 2013-11-18 NOTE — Progress Notes (Signed)
Pre visit review using our clinic review tool, if applicable. No additional management support is needed unless otherwise documented below in the visit note. 

## 2013-11-21 ENCOUNTER — Telehealth: Payer: Self-pay | Admitting: Family

## 2013-11-21 NOTE — Telephone Encounter (Signed)
Tested positive for flu, is there anything her family can take

## 2013-11-21 NOTE — Telephone Encounter (Signed)
Notified pt, her spouse sees [name] and her son sees Dr Birdie Riddle.  Messages sent to PCP's.

## 2013-11-21 NOTE — Telephone Encounter (Signed)
Family can contact their provider's and request tamiflu to decrease their chance of getting flu.

## 2013-11-23 ENCOUNTER — Encounter: Payer: Self-pay | Admitting: Family Medicine

## 2013-11-23 DIAGNOSIS — J111 Influenza due to unidentified influenza virus with other respiratory manifestations: Secondary | ICD-10-CM | POA: Insufficient documentation

## 2013-11-23 NOTE — Assessment & Plan Note (Signed)
Started on Tamiflu and taken out of work for the rest of the week. Encouraged increased rest and hydration, add probiotics, zinc such as Coldeze or Xicam. Treat fevers as needed

## 2013-11-23 NOTE — Progress Notes (Signed)
Patient ID: Michele [name], female   DOB: 13-Jan-1971, 43 y.o.   MRN: [id number] Michele [name] [id number] 25-Sep-1970 11/23/2013      Progress Note-Follow Up  Subjective  Chief Complaint  Chief Complaint  Patient presents with  . Fever    X 2 days- took tylenol at 12:30 pm today  . Chills    X 2 days  . Generalized Body Aches    X 2 days  . Cough    X 2 days- dry  . Headache    HPI  Patient is a 43 year old female in today for evaluation of viral illness. Struggling with fevers/chills/HA/malaise/myalgias/anorexia/dry cough. Has tried Delsym and Tylenol with minimal improvements  Past Medical History  Diagnosis Date  . Depression   . Hypertension   . Anemia     iron deficient, microcytic, hypochromic  . Anxiety   . Migraines   . 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 Fibroids     uterine  . Palpitations     recurrent  . Fatigue   . Tachycardia     unspecified  . Chest pain     atypical  . 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 Posey Pronto at [facility]- S/p FNA March/april of two dominant nodules- benign.  Following for annual thyroid US with Dr. Posey Pronto.    . Diabetes mellitus without complication   . Fatty liver 08/15/2013    Past Surgical History  Procedure Laterality Date  . Appendectomy  03/2009  . Tubal ligation  1997  . Tuboplasty / tubotubal anastomosis  2005  . Biopsy thyroid  November 28, 2009    Dr. Posey Pronto- endo  . Dilation and curettage of uterus  05/21/2011    Procedure: DILATATION AND CURETTAGE (D&C);  Surgeon: Elveria Royals;  Location: [facility];  Service: Gynecology;  Laterality: N/A;  dilitation and currettage/endometrial currettings    Family History  Problem Relation Age of Onset  . Hypertension Mother   . Cancer Mother 84    breast  . Cancer Father     colon  . Stroke Brother     handicapped due to complications from spinal meningitis  . Seizures Brother   . Asthma Son   . Arthritis Maternal Grandmother   .  Hypertension Maternal Grandmother   . Stroke Maternal Grandfather     History   Social History  . Marital Status: Married    Spouse Name: [name]    Number of Children: 2  . Years of Education: N/A   Occupational History  . 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 Smoking status: Never Smoker   . Smokeless tobacco: Never Used  . Alcohol Use: Yes     Comment: Once a month  . Drug Use: No  . Sexual Activity: Yes    Birth Control/ Protection: None, Surgical   Other Topics Concern  . 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 acetaminophen (TYLENOL) 500 MG tablet Take 1,000-1,500 mg by mouth every 6 (six) hours as needed. For headache      . ALPRAZolam (XANAX) 1 MG tablet Take 1 mg by  mouth 4 (four) times daily as needed. For anxiety      . betamethasone dipropionate (DIPROLENE) 0.05 % cream Apply topically as needed.       Marland Kitchen EPIPEN 2-PAK 0.3 MG/0.3ML SOAJ injection as needed.       . fluticasone (FLONASE) 50 MCG/ACT nasal spray Place 2 sprays into the nose as needed.      Marland Kitchen ibuprofen (ADVIL) 600 MG tablet Take 1 tablet (600 mg total) by mouth every 6 (six) hours as needed for pain.  60 tablet  0  . losartan (COZAAR) 100 MG tablet Take 1 tablet (100 mg total) by mouth daily.  90 tablet  3  . oxymetazoline (AFRIN) 0.05 % nasal spray Place 2 sprays into the nose as needed.      . propranolol (INDERAL) 10 MG tablet Take 1 tablet (10 mg total) by mouth 3 (three) times daily.  90 tablet  3  . spironolactone (ALDACTONE) 25 MG tablet Take 1 tablet (25 mg total) by mouth daily.  30 tablet  5  . promethazine (PHENERGAN) 25 MG tablet Take 1 tablet (25 mg total) by mouth every 6 (six) hours as needed for nausea.  20 tablet  0   . [DISCONTINUED] buPROPion (WELLBUTRIN XL) 150 MG 24 hr tablet Take 2 tablets (300 mg total) by mouth daily.  60 tablet  1   No current facility-administered medications on file prior to visit.    Allergies  Allergen Reactions  . Amlodipine Shortness Of Breath and Palpitations  . Bee Venom   . Wellbutrin [Bupropion Hcl] Other (See Comments)    Dizziness, increased anxiety    Review of Systems  Review of Systems  Constitutional: Positive for fever, chills and malaise/fatigue.  HENT: Positive for congestion.   Eyes: Negative for discharge.  Respiratory: Positive for cough and sputum production. Negative for shortness of breath.   Cardiovascular: Negative for chest pain, palpitations and leg swelling.  Gastrointestinal: Negative for nausea, abdominal pain and diarrhea.  Genitourinary: Negative for dysuria.  Musculoskeletal: Positive for myalgias. Negative for falls.  Skin: Negative for rash.  Neurological: Positive for headaches. Negative for loss of consciousness.  Endo/Heme/Allergies: Negative for polydipsia.  Psychiatric/Behavioral: Negative for depression and suicidal ideas. The patient is not nervous/anxious and does not have insomnia.     Objective  BP 100/70  Pulse 96  Temp(Src) 98.6 F (37 C) (Oral)  Ht 5\' 5"  (1.651 m)  Wt 185 lb 0.6 oz (83.934 kg)  BMI 30.79 kg/m2  SpO2 99%  Physical Exam  Physical Exam  Constitutional: She is oriented to person, place, and time and well-developed, well-nourished, and in no distress. No distress.  HENT:  Head: Normocephalic and atraumatic.  Eyes: Conjunctivae are normal.  Neck: Neck supple. No thyromegaly present.  Cardiovascular: Normal rate, regular rhythm and normal heart sounds.   No murmur heard. Pulmonary/Chest: Effort normal and breath sounds normal. She has no wheezes.  Decreased breath sounds/ b/l bases  Abdominal: Soft. Bowel sounds are normal. She exhibits no distension and no mass.  Musculoskeletal: She  exhibits no edema.  Lymphadenopathy:    She has cervical adenopathy.  Neurological: She is alert and oriented to person, place, and time.  Skin: Skin is warm and dry. No rash noted. She is not diaphoretic.  Psychiatric: Memory, affect and judgment normal.    Lab Results  Component Value Date   TSH 0.707 10/08/2013   Lab Results  Component Value Date   WBC 6.6 10/08/2013   HGB 13.6  10/08/2013   HCT 39.9 10/08/2013   MCV 88 10/08/2013   PLT 214 10/08/2013   Lab Results  Component Value Date   CREATININE 0.64 10/08/2013   BUN 8 10/08/2013   NA 139 10/08/2013   K 4.8 10/08/2013   CL 104 10/08/2013   CO2 31 10/08/2013   Lab Results  Component Value Date   ALT 14 10/08/2013   AST 15 10/08/2013   ALKPHOS 67 10/08/2013   BILITOT 1.0 10/08/2013   Lab Results  Component Value Date   CHOL 170 10/08/2013   Lab Results  Component Value Date   HDL 42 10/08/2013   Lab Results  Component Value Date   LDLCALC 115* 10/08/2013   Lab Results  Component Value Date   TRIG 63 10/08/2013   Lab Results  Component Value Date   CHOLHDL 4.0 10/08/2013     Assessment & Plan  Flu Started on Tamiflu and taken out of work for the rest of the week. Encouraged increased rest and hydration, add probiotics, zinc such as Coldeze or Xicam. Treat fevers as needed

## 2013-11-24 ENCOUNTER — Ambulatory Visit (INDEPENDENT_AMBULATORY_CARE_PROVIDER_SITE_OTHER): Payer: BC Managed Care – PPO | Admitting: Family

## 2013-11-24 ENCOUNTER — Encounter: Payer: Self-pay | Admitting: Family

## 2013-11-24 VITALS — BP 118/88 | HR 85 | Temp 97.8°F | Resp 20 | Ht 65.0 in | Wt 180.1 lb

## 2013-11-24 DIAGNOSIS — J111 Influenza due to unidentified influenza virus with other respiratory manifestations: Secondary | ICD-10-CM

## 2013-11-24 DIAGNOSIS — D72829 Elevated white blood cell count, unspecified: Secondary | ICD-10-CM

## 2013-11-24 NOTE — Progress Notes (Signed)
Subjective:    Patient ID: Michele [name], female    DOB: [birth year], 43 y.o.   MRN: [id number]  HPI  [name] is a 43 yr old female who presents today for follow up of her influenza. Symptoms started 3/9.  Was seen 3/10 and given tamiflu (tested + for flu B). She reports that her myalgias have resolved.  She reports cough and fatigue is unchanged.  She did not have a flu shot.   Reports that it feels sore in her chest.    Review of Systems See HPI  Past Medical History  Diagnosis Date  . Depression   . Hypertension   . Anemia     iron deficient, microcytic, hypochromic  . Anxiety   . Migraines   . 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 Fibroids     uterine  . Palpitations     recurrent  . Fatigue   . Tachycardia     unspecified  . Chest pain     atypical  . 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 Posey Pronto at [facility]- S/p FNA March/april of two dominant nodules- benign.  Following for annual thyroid US with Dr. Posey Pronto.    . Diabetes mellitus without complication   . Fatty liver 08/15/2013    History   Social History  . Marital Status: Married    Spouse Name: [name]    Number of Children: 2  . Years of Education: N/A   Occupational History  . 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 Smoking status: Never Smoker   . Smokeless tobacco: Never Used  . Alcohol Use: Yes     Comment: Once a month  . Drug Use: No  . Sexual Activity: Yes    Birth Control/ Protection: None, Surgical   Other Topics Concern  . 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 Appendectomy  03/2009  . Tubal ligation  1997  . Tuboplasty / tubotubal anastomosis  2005  . Biopsy thyroid  November 28, 2009    Dr. Posey Pronto- endo  .  Dilation and curettage of uterus  05/21/2011    Procedure: DILATATION AND CURETTAGE (D&C);  Surgeon: Elveria Royals;  Location: [facility];  Service: Gynecology;  Laterality: N/A;  dilitation and currettage/endometrial currettings    Family History  Problem Relation Age of Onset  . Hypertension Mother   . Cancer Mother 41    breast  . Cancer Father     colon  . Stroke Brother     handicapped due to complications from spinal meningitis  . Seizures Brother   . Asthma Son   . Arthritis Maternal Grandmother   . Hypertension Maternal Grandmother   . Stroke Maternal Grandfather     Allergies  Allergen Reactions  . Amlodipine Shortness Of Breath and Palpitations  . Bee Venom   . Wellbutrin [Bupropion Hcl] Other (See Comments)    Dizziness, increased anxiety    Current Outpatient Prescriptions on File Prior to Visit  Medication Sig Dispense Refill  . acetaminophen (TYLENOL) 500 MG tablet Take 1,000-1,500 mg by mouth every 6 (six) hours as needed. For headache      .  ALPRAZolam (XANAX) 1 MG tablet Take 1 mg by mouth 4 (four) times daily as needed. For anxiety      . benzonatate (TESSALON) 100 MG capsule Take 1 capsule (100 mg total) by mouth 3 (three) times daily as needed for cough.  30 capsule  1  . betamethasone dipropionate (DIPROLENE) 0.05 % cream Apply topically as needed.       Marland Kitchen EPIPEN 2-PAK 0.3 MG/0.3ML SOAJ injection as needed.       . fluticasone (FLONASE) 50 MCG/ACT nasal spray Place 2 sprays into the nose as needed.      Marland Kitchen HYDROcodone-homatropine (HYCODAN) 5-1.5 MG/5ML syrup Take 5 mLs by mouth every 8 (eight) hours as needed for cough.  140 mL  0  . ibuprofen (ADVIL) 600 MG tablet Take 1 tablet (600 mg total) by mouth every 6 (six) hours as needed for pain.  60 tablet  0  . losartan (COZAAR) 100 MG tablet Take 1 tablet (100 mg total) by mouth daily.  90 tablet  3  . oseltamivir (TAMIFLU) 75 MG capsule Take 1 capsule (75 mg total) by mouth 2 (two) times daily.  10 capsule  0  .  oxymetazoline (AFRIN) 0.05 % nasal spray Place 2 sprays into the nose as needed.      . promethazine (PHENERGAN) 25 MG tablet Take 1 tablet (25 mg total) by mouth every 8 (eight) hours as needed for nausea or vomiting.  30 tablet  0  . propranolol (INDERAL) 10 MG tablet Take 1 tablet (10 mg total) by mouth 3 (three) times daily.  90 tablet  3  . spironolactone (ALDACTONE) 25 MG tablet Take 1 tablet (25 mg total) by mouth daily.  30 tablet  5  . promethazine (PHENERGAN) 25 MG tablet Take 1 tablet (25 mg total) by mouth every 6 (six) hours as needed for nausea.  20 tablet  0  . [DISCONTINUED] buPROPion (WELLBUTRIN XL) 150 MG 24 hr tablet Take 2 tablets (300 mg total) by mouth daily.  60 tablet  1   No current facility-administered medications on file prior to visit.    BP 118/88  Pulse 85  Temp(Src) 97.8 F (36.6 C) (Oral)  Resp 20  Ht 5\' 5"  (1.651 m)  Wt 180 lb 1.3 oz (81.684 kg)  BMI 29.97 kg/m2  SpO2 99%       Objective:   Physical Exam  Constitutional: She is oriented to person, place, and time. She appears well-developed and well-nourished. No distress.  HENT:  Head: Normocephalic and atraumatic.  Right Ear: Tympanic membrane and ear canal normal.  Left Ear: Tympanic membrane and ear canal normal.  Mouth/Throat: No oropharyngeal exudate or posterior oropharyngeal edema.  Cardiovascular: Normal rate and regular rhythm.   No murmur heard. Pulmonary/Chest: Effort normal and breath sounds normal. No respiratory distress. She has no wheezes. She has no rales.  Musculoskeletal: She exhibits no edema.  Neurological: She is alert and oriented to person, place, and time.  Psychiatric: She has a normal mood and affect. Her behavior is normal. Judgment and thought content normal.          Assessment & Plan:

## 2013-11-24 NOTE — Patient Instructions (Signed)
Call me Friday AM if symptoms are not significantly improved. Call sooner if you develop fever or if symptoms worsen.  Complete tamiflu.

## 2013-11-24 NOTE — Progress Notes (Signed)
Pre visit review using our clinic review tool, if applicable. No additional management support is needed unless otherwise documented below in the visit note. 

## 2013-11-25 NOTE — Assessment & Plan Note (Signed)
Advised pt on normal course of flu of 7-10 days. She is at the 7 day mark. Exam unremarkable, afebrile, continue supportive measures, follow up if symptoms worsen, or if not improved in 3 days.

## 2013-11-28 ENCOUNTER — Encounter: Payer: Self-pay | Admitting: *Deleted

## 2013-12-01 ENCOUNTER — Telehealth: Payer: Self-pay | Admitting: *Deleted

## 2013-12-01 NOTE — Telephone Encounter (Signed)
Pt faxed FMLA forms to be completed for the following dates:  3/11, 3/12. 3/13, 3/16, 3/17 and 3/18 due to her diagnosis of the flu. Form forwarded to Provider for completion.

## 2013-12-02 ENCOUNTER — Other Ambulatory Visit (HOSPITAL_BASED_OUTPATIENT_CLINIC_OR_DEPARTMENT_OTHER): Payer: BC Managed Care – PPO | Admitting: Lab

## 2013-12-02 ENCOUNTER — Encounter: Payer: Self-pay | Admitting: Hematology & Oncology

## 2013-12-02 ENCOUNTER — Ambulatory Visit (HOSPITAL_BASED_OUTPATIENT_CLINIC_OR_DEPARTMENT_OTHER): Payer: BC Managed Care – PPO | Admitting: Hematology & Oncology

## 2013-12-02 VITALS — BP 124/81 | HR 76 | Temp 97.8°F | Resp 14 | Ht 65.0 in | Wt 187.0 lb

## 2013-12-02 DIAGNOSIS — D508 Other iron deficiency anemias: Secondary | ICD-10-CM

## 2013-12-02 DIAGNOSIS — M6281 Muscle weakness (generalized): Secondary | ICD-10-CM

## 2013-12-02 DIAGNOSIS — N920 Excessive and frequent menstruation with regular cycle: Secondary | ICD-10-CM

## 2013-12-02 DIAGNOSIS — R5381 Other malaise: Secondary | ICD-10-CM

## 2013-12-02 DIAGNOSIS — R635 Abnormal weight gain: Secondary | ICD-10-CM

## 2013-12-02 DIAGNOSIS — I10 Essential (primary) hypertension: Secondary | ICD-10-CM

## 2013-12-02 DIAGNOSIS — R5383 Other fatigue: Secondary | ICD-10-CM

## 2013-12-02 DIAGNOSIS — D509 Iron deficiency anemia, unspecified: Secondary | ICD-10-CM

## 2013-12-02 DIAGNOSIS — L659 Nonscarring hair loss, unspecified: Secondary | ICD-10-CM

## 2013-12-02 DIAGNOSIS — E042 Nontoxic multinodular goiter: Secondary | ICD-10-CM

## 2013-12-02 LAB — CBC WITH DIFFERENTIAL (CANCER CENTER ONLY)
BASO#: 0 10*3/uL (ref 0.0–0.2)
BASO%: 0.1 % (ref 0.0–2.0)
EOS%: 0.6 % (ref 0.0–7.0)
Eosinophils Absolute: 0.1 10*3/uL (ref 0.0–0.5)
HCT: 37.5 % (ref 34.8–46.6)
HGB: 12.9 g/dL (ref 11.6–15.9)
LYMPH#: 2 10*3/uL (ref 0.9–3.3)
LYMPH%: 24.5 % (ref 14.0–48.0)
MCH: 31 pg (ref 26.0–34.0)
MCHC: 34.4 g/dL (ref 32.0–36.0)
MCV: 90 fL (ref 81–101)
MONO#: 0.7 10*3/uL (ref 0.1–0.9)
MONO%: 8.1 % (ref 0.0–13.0)
NEUT#: 5.5 10*3/uL (ref 1.5–6.5)
NEUT%: 66.7 % (ref 39.6–80.0)
Platelets: 243 10*3/uL (ref 145–400)
RBC: 4.16 10*6/uL (ref 3.70–5.32)
RDW: 12.6 % (ref 11.1–15.7)
WBC: 8.2 10*3/uL (ref 3.9–10.0)

## 2013-12-02 LAB — RETICULOCYTES (CHCC)
ABS Retic: 102.5 10*3/uL (ref 19.0–186.0)
RBC.: 4.27 MIL/uL (ref 3.87–5.11)
RETIC CT PCT: 2.4 % — AB (ref 0.4–2.3)

## 2013-12-02 LAB — CHCC SATELLITE - SMEAR

## 2013-12-02 LAB — FOLLICLE STIMULATING HORMONE: FSH: 11.5 m[IU]/mL

## 2013-12-02 LAB — SEDIMENTATION RATE: Sed Rate: 5 mm/hr (ref 0–22)

## 2013-12-02 LAB — LUTEINIZING HORMONE: LH: 3.7 m[IU]/mL

## 2013-12-02 MED ORDER — ATENOLOL 25 MG PO TABS: 25.0000 mg | ORAL_TABLET | Freq: Every day | ORAL | Status: DC

## 2013-12-02 MED ORDER — LEVOTHYROXINE SODIUM 50 MCG PO TABS: 50.0000 ug | ORAL_TABLET | Freq: Every day | ORAL | Status: DC

## 2013-12-02 NOTE — Progress Notes (Signed)
Hematology and Oncology Follow Up Visit  Michele [name] [id number] [birth year] 43 y.o. 12/02/2013   Principle Diagnosis:  Recurrent iron-deficiency anemia. 2. Chronic fatigue.  Current Therapy:    IV iron as indicated.     Interim History:  [name] is back for followup. She still is feeling tired. I done a bunch of tests on her. So far all the tests come back negative. I tested her for low zinc. I tested her for myositis. Everything has come back negative. She has had her cholesterol checked. This really has not that bad. When we last saw her, her iron studies looked okay. She has had no problems with her blood sugars.  Her TSH has been very low. Personally, I think she still has some hypo-thyroidism. She really looks like she has hypothyroidism. Her skin is dry. I tested her reflexes and there were a little bit slow. She's gaining weight. I really thought that getting her on some Synthroid might help.  She also thinks that the propranolol that she is on is causing problems. Will try her on atenolol as this is once a day and in time-released.  She does have sleep apnea. I thought this also might be an issue. She says that she uses the CPAP machine daily.  Her appetite is no greater. There is no change in bowel or bladder habits. She has not lost as much hair.   Medications: Current outpatient prescriptions:acetaminophen (TYLENOL) 500 MG tablet, Take 1,000-1,500 mg by mouth every 6 (six) hours as needed. For headache, Disp: , Rfl: ;  ALPRAZolam (XANAX) 1 MG tablet, Take 1 mg by mouth as needed. For anxiety, Disp: , Rfl: ;  EPIPEN 2-PAK 0.3 MG/0.3ML SOAJ injection, as needed. , Disp: , Rfl: ;  FLUoxetine (PROZAC) 20 MG capsule, Take 20 mg by mouth daily., Disp: , Rfl:  fluticasone (FLONASE) 50 MCG/ACT nasal spray, Place 2 sprays into the nose as needed., Disp: , Rfl: ;  ibuprofen (ADVIL) 600 MG tablet, Take 1 tablet (600 mg total) by mouth every 6 (six) hours as needed for  pain., Disp: 60 tablet, Rfl: 0;  losartan (COZAAR) 100 MG tablet, Take 1 tablet (100 mg total) by mouth daily., Disp: 90 tablet, Rfl: 3;  oxymetazoline (AFRIN) 0.05 % nasal spray, Place 2 sprays into the nose as needed., Disp: , Rfl:  promethazine (PHENERGAN) 25 MG tablet, Take 1 tablet (25 mg total) by mouth every 8 (eight) hours as needed for nausea or vomiting., Disp: 30 tablet, Rfl: 0;  spironolactone (ALDACTONE) 25 MG tablet, Take 1 tablet (25 mg total) by mouth daily., Disp: 30 tablet, Rfl: 5;  atenolol (TENORMIN) 25 MG tablet, Take 1 tablet (25 mg total) by mouth daily., Disp: 30 tablet, Rfl: 4 levothyroxine (SYNTHROID) 50 MCG tablet, Take 1 tablet (50 mcg total) by mouth daily before breakfast., Disp: 30 tablet, Rfl: 3;  [DISCONTINUED] buPROPion (WELLBUTRIN XL) 150 MG 24 hr tablet, Take 2 tablets (300 mg total) by mouth daily., Disp: 60 tablet, Rfl: 1  Allergies:  Allergies  Allergen Reactions  . Amlodipine Shortness Of Breath and Palpitations  . Bee Venom   . Wellbutrin [Bupropion Hcl] Other (See Comments)    Dizziness, increased anxiety    Past Medical History, Surgical history, Social history, and Family History were reviewed and updated.  Review of Systems: As above  Physical Exam:  height is 5\' 5"  (1.651 m) and weight is 187 lb (84.823 kg). Her oral temperature is 97.8 F (36.6 C). Her blood pressure  is 124/81 and her pulse is 76. Her respiration is 14.   Somewhat obese African American female. Pupils react. Oral cavity slightly dry. No adenopathy in the neck. Lungs are with decreased in the bases. Cardiac exam regular rate and rhythm. Abdomen is soft. She is mildly obese. There is no palpable liver or spleen tip. Back exam no tenderness over the spine ribs or hips. Extremities show some slight nonpitting edema in her lower legs. She has decent strength. She has decent range of motion of her joints. Skin exam is dry. Neurological exam shows some slight lethargy.  Lab Results   Component Value Date   WBC 8.2 12/02/2013   HGB 12.9 12/02/2013   HCT 37.5 12/02/2013   MCV 90 12/02/2013   PLT 243 12/02/2013     Chemistry      Component Value Date/Time   NA 139 10/08/2013 1108   NA 141 06/26/2013 1200   K 4.8 10/08/2013 1108   K 4.7 06/26/2013 1200   CL 104 10/08/2013 1108   CO2 31 10/08/2013 1108   CO2 27 06/26/2013 1200   BUN 8 10/08/2013 1108   BUN 12.2 06/26/2013 1200   CREATININE 0.64 10/08/2013 1108   CREATININE 1.00 08/11/2013 1815   CREATININE 0.9 06/26/2013 1200      Component Value Date/Time   CALCIUM 9.6 10/08/2013 1108   CALCIUM 9.6 06/26/2013 1200   ALKPHOS 67 10/08/2013 1108   AST 15 10/08/2013 1108   ALT 14 10/08/2013 1108   BILITOT 1.0 10/08/2013 1108         Impression and Plan: [name] is 43 year old African American female. She has known iron deficiency. We would happen with this. She still has this fatigue.  Hopefully, they came a couple medicine changes might help. I don't think that we will put her at any disadvantage. We will try her on some low-dose Synthroid. I will also try her on the atenolol.  We will see what her iron studies are. If they are low, we will replace him with IV iron.  I will have her come back in another 4 weeks.  I spent about 40 minutes or so with her today trying to sort out what else we can do to help her out. We will certainly pray for her.   Volanda Napoleon, MD 3/24/201511:58 AM

## 2013-12-03 ENCOUNTER — Telehealth: Payer: Self-pay | Admitting: Hematology & Oncology

## 2013-12-03 LAB — IRON AND TIBC CHCC
%SAT: 32 % (ref 21–57)
IRON: 80 ug/dL (ref 41–142)
TIBC: 250 ug/dL (ref 236–444)
UIBC: 170 ug/dL (ref 120–384)

## 2013-12-03 LAB — FERRITIN CHCC: FERRITIN: 358 ng/mL — AB (ref 9–269)

## 2013-12-03 NOTE — Telephone Encounter (Signed)
Pt left message requesting status of FMLA form?  Please advise.

## 2013-12-03 NOTE — Telephone Encounter (Signed)
FMLA will be ready on Friday.

## 2013-12-03 NOTE — Telephone Encounter (Signed)
Pt aware of 5-6 appointment

## 2013-12-03 NOTE — Telephone Encounter (Signed)
Left message on cell# that forms will be ready Friday and I will call her when completed.

## 2013-12-04 ENCOUNTER — Encounter: Payer: Self-pay | Admitting: *Deleted

## 2013-12-05 NOTE — Telephone Encounter (Signed)
Original placed at front desk for pick up and pt notified. Copy sent to scan.

## 2013-12-06 LAB — ESTRADIOL, ULTRA SENS: Estradiol, Ultra Sensitive: 40 pg/mL

## 2014-01-09 ENCOUNTER — Ambulatory Visit: Payer: BC Managed Care – PPO | Admitting: Family

## 2014-01-12 ENCOUNTER — Ambulatory Visit
Admission: RE | Admit: 2014-01-12 | Discharge: 2014-01-12 | Disposition: A | Discharge status: Home / Self Care | Payer: BC Managed Care – PPO | Source: Ambulatory Visit

## 2014-01-12 DIAGNOSIS — Z1231 Encounter for screening mammogram for malignant neoplasm of breast: Secondary | ICD-10-CM

## 2014-01-14 ENCOUNTER — Encounter: Payer: Self-pay | Admitting: Hematology & Oncology

## 2014-01-14 ENCOUNTER — Other Ambulatory Visit (HOSPITAL_BASED_OUTPATIENT_CLINIC_OR_DEPARTMENT_OTHER): Payer: BC Managed Care – PPO | Admitting: Lab

## 2014-01-14 ENCOUNTER — Ambulatory Visit (HOSPITAL_BASED_OUTPATIENT_CLINIC_OR_DEPARTMENT_OTHER): Payer: BC Managed Care – PPO | Admitting: Hematology & Oncology

## 2014-01-14 VITALS — BP 129/76 | HR 72 | Temp 98.0°F | Resp 14 | Ht 65.0 in | Wt 183.0 lb

## 2014-01-14 DIAGNOSIS — R5381 Other malaise: Secondary | ICD-10-CM

## 2014-01-14 DIAGNOSIS — R5383 Other fatigue: Secondary | ICD-10-CM

## 2014-01-14 DIAGNOSIS — I10 Essential (primary) hypertension: Secondary | ICD-10-CM

## 2014-01-14 DIAGNOSIS — D508 Other iron deficiency anemias: Secondary | ICD-10-CM

## 2014-01-14 DIAGNOSIS — E042 Nontoxic multinodular goiter: Secondary | ICD-10-CM

## 2014-01-14 DIAGNOSIS — D509 Iron deficiency anemia, unspecified: Secondary | ICD-10-CM

## 2014-01-14 LAB — IRON AND TIBC CHCC
%SAT: 11 % — ABNORMAL LOW (ref 21–57)
Iron: 29 ug/dL — ABNORMAL LOW (ref 41–142)
TIBC: 268 ug/dL (ref 236–444)
UIBC: 238 ug/dL (ref 120–384)

## 2014-01-14 LAB — CMP (CANCER CENTER ONLY)
ALBUMIN: 4.1 g/dL (ref 3.3–5.5)
ALT(SGPT): 17 U/L (ref 10–47)
AST: 21 U/L (ref 11–38)
Alkaline Phosphatase: 58 U/L (ref 26–84)
BILIRUBIN TOTAL: 1.2 mg/dL (ref 0.20–1.60)
BUN: 12 mg/dL (ref 7–22)
CO2: 29 mEq/L (ref 18–33)
CREATININE: 1 mg/dL (ref 0.6–1.2)
Calcium: 9.3 mg/dL (ref 8.0–10.3)
Chloride: 104 mEq/L (ref 98–108)
GLUCOSE: 86 mg/dL (ref 73–118)
Potassium: 3.6 mEq/L (ref 3.3–4.7)
Sodium: 141 mEq/L (ref 128–145)
Total Protein: 7.7 g/dL (ref 6.4–8.1)

## 2014-01-14 LAB — RETICULOCYTES (CHCC)
ABS Retic: 45 10*3/uL (ref 19.0–186.0)
RBC.: 4.5 MIL/uL (ref 3.87–5.11)
RETIC CT PCT: 1 % (ref 0.4–2.3)

## 2014-01-14 LAB — CBC WITH DIFFERENTIAL (CANCER CENTER ONLY)
BASO#: 0 10*3/uL (ref 0.0–0.2)
BASO%: 0 % (ref 0.0–2.0)
EOS%: 0.1 % (ref 0.0–7.0)
Eosinophils Absolute: 0 10*3/uL (ref 0.0–0.5)
HCT: 39.3 % (ref 34.8–46.6)
HGB: 13.7 g/dL (ref 11.6–15.9)
LYMPH#: 1.3 10*3/uL (ref 0.9–3.3)
LYMPH%: 15.7 % (ref 14.0–48.0)
MCH: 30.8 pg (ref 26.0–34.0)
MCHC: 34.9 g/dL (ref 32.0–36.0)
MCV: 88 fL (ref 81–101)
MONO#: 0.9 10*3/uL (ref 0.1–0.9)
MONO%: 10.5 % (ref 0.0–13.0)
NEUT#: 6.3 10*3/uL (ref 1.5–6.5)
NEUT%: 73.7 % (ref 39.6–80.0)
Platelets: 191 10*3/uL (ref 145–400)
RBC: 4.45 10*6/uL (ref 3.70–5.32)
RDW: 12.6 % (ref 11.1–15.7)
WBC: 8.6 10*3/uL (ref 3.9–10.0)

## 2014-01-14 LAB — TSH CHCC: TSH: 0.554 m(IU)/L (ref 0.308–3.960)

## 2014-01-14 LAB — FERRITIN CHCC: FERRITIN: 284 ng/mL — AB (ref 9–269)

## 2014-01-14 NOTE — Progress Notes (Signed)
Hematology and Oncology Follow Up Visit  Michele [name] [id number] 03-Sep-1971 43 y.o. 01/14/2014   Principle Diagnosis:   Recurrent iron deficiency anemia  Menometrorrhagia  Chronic fatigue  Current Therapy:    IV iron as indicated     Interim History:  [name] is is back for followup. Unfortunately, she continues to be a real problem for her. I have that done everything that I can think of. I did not she was I can do to help this. I don't know she is chronic fatigue syndrome. This is hard to prove. There is no one test for it.  We've checked her estrogen. We checked her TSH. We checked zinc levels on her. I just cannot think of anything else that we can do.  She has sleep apnea. Although if she is having issues with this. She says that her sleep specialist doesn't think that sleep apnea would be a problem as she has a mild case of this. She is on quite a few medications. Although if any of these might be causing problems. She still is having her monthly cycle. This has been quite heavy.  We last checked her eyes there is a, her ferritin was 358. Breath is no fever. Is no sweating. There is no cough. No mouth sores.  Medications: Current outpatient prescriptions:acetaminophen (TYLENOL) 500 MG tablet, Take 1,000-1,500 mg by mouth every 6 (six) hours as needed. For headache, Disp: , Rfl: ;  ALPRAZolam (XANAX) 1 MG tablet, Take 1 mg by mouth as needed. For anxiety, Disp: , Rfl: ;  atenolol (TENORMIN) 25 MG tablet, Take 1 tablet (25 mg total) by mouth daily., Disp: 30 tablet, Rfl: 4;  FLUoxetine (PROZAC) 20 MG capsule, Take 20 mg by mouth daily., Disp: , Rfl:  fluticasone (FLONASE) 50 MCG/ACT nasal spray, Place 2 sprays into the nose as needed., Disp: , Rfl: ;  ibuprofen (ADVIL) 600 MG tablet, Take 1 tablet (600 mg total) by mouth every 6 (six) hours as needed for pain., Disp: 60 tablet, Rfl: 0;  levothyroxine (SYNTHROID) 50 MCG tablet, Take 1 tablet (50 mcg total) by mouth  daily before breakfast., Disp: 30 tablet, Rfl: 3 losartan (COZAAR) 100 MG tablet, Take 1 tablet (100 mg total) by mouth daily., Disp: 90 tablet, Rfl: 3;  spironolactone (ALDACTONE) 25 MG tablet, Take 1 tablet (25 mg total) by mouth daily., Disp: 30 tablet, Rfl: 5;  EPIPEN 2-PAK 0.3 MG/0.3ML SOAJ injection, as needed. , Disp: , Rfl: ;  [DISCONTINUED] buPROPion (WELLBUTRIN XL) 150 MG 24 hr tablet, Take 2 tablets (300 mg total) by mouth daily., Disp: 60 tablet, Rfl: 1  Allergies:  Allergies  Allergen Reactions  . Amlodipine Shortness Of Breath and Palpitations  . Bee Venom   . Wellbutrin [Bupropion Hcl] Other (See Comments)    Dizziness, increased anxiety    Past Medical History, Surgical history, Social history, and Family History were reviewed and updated.  Review of Systems: As above  Physical Exam:  height is 5\' 5"  (1.651 m) and weight is 183 lb (83.008 kg). Her oral temperature is 98 F (36.7 C). Her blood pressure is 129/76 and her pulse is 72. Her respiration is 14.   Lungs are clear. Oral exam is negative. She has moist mucosa. Neck is supple with no lymphadenopathy. Thyroid is nonpalpable. Lungs are clear. Cardiac exam regular in rhythm. Abdomen soft. She has no palpable liver or spleen. Back exam no tenderness over the spine. X-ray shows some trace edema. She has good range of  motion of her joints. Skin exam no rashes. Neurological exam is nonfocal.  Lab Results  Component Value Date   WBC 8.6 01/14/2014   HGB 13.7 01/14/2014   HCT 39.3 01/14/2014   MCV 88 01/14/2014   PLT 191 01/14/2014     Chemistry      Component Value Date/Time   NA 141 01/14/2014 1200   NA 139 10/08/2013 1108   NA 141 06/26/2013 1200   K 3.6 01/14/2014 1200   K 4.8 10/08/2013 1108   K 4.7 06/26/2013 1200   CL 104 01/14/2014 1200   CL 104 10/08/2013 1108   CO2 29 01/14/2014 1200   CO2 31 10/08/2013 1108   CO2 27 06/26/2013 1200   BUN 12 01/14/2014 1200   BUN 8 10/08/2013 1108   BUN 12.2 06/26/2013 1200   CREATININE  1.0 01/14/2014 1200   CREATININE 1.00 08/11/2013 1815   CREATININE 0.9 06/26/2013 1200      Component Value Date/Time   CALCIUM 9.3 01/14/2014 1200   CALCIUM 9.6 10/08/2013 1108   CALCIUM 9.6 06/26/2013 1200   ALKPHOS 58 01/14/2014 1200   ALKPHOS 67 10/08/2013 1108   AST 21 01/14/2014 1200   AST 15 10/08/2013 1108   ALT 17 01/14/2014 1200   ALT 14 10/08/2013 1108   BILITOT 1.20 01/14/2014 1200   BILITOT 1.0 10/08/2013 1108         Impression and Plan: [name] is 43 year old African female. She has a history of recurrent iron deficiency anemia.  Still y she is so tired.  We will see what iron studies show. Her hemoglobin is quite good.I would not think that she is iron deficient.  I'll plan to have her come back in 2 months time.  I do not think that we need to do any additional studies on her.   Volanda Napoleon, MD 5/6/20151:57 PM

## 2014-01-16 ENCOUNTER — Other Ambulatory Visit: Payer: Self-pay | Admitting: *Deleted

## 2014-01-16 ENCOUNTER — Telehealth: Payer: Self-pay | Admitting: Hematology & Oncology

## 2014-01-16 ENCOUNTER — Encounter: Payer: Self-pay | Admitting: *Deleted

## 2014-01-16 DIAGNOSIS — D508 Other iron deficiency anemias: Secondary | ICD-10-CM

## 2014-01-16 NOTE — Telephone Encounter (Signed)
Pt aware of 5-13 iron

## 2014-01-21 ENCOUNTER — Ambulatory Visit (HOSPITAL_BASED_OUTPATIENT_CLINIC_OR_DEPARTMENT_OTHER): Payer: BC Managed Care – PPO

## 2014-01-21 VITALS — BP 118/78 | HR 78 | Temp 87.0°F | Resp 20

## 2014-01-21 DIAGNOSIS — D508 Other iron deficiency anemias: Secondary | ICD-10-CM

## 2014-01-21 MED ORDER — SODIUM CHLORIDE 0.9 % IV SOLN
1020.0000 mg | Freq: Once | INTRAVENOUS | Status: AC
  Administered 2014-01-21: 1020 mg via INTRAVENOUS
  Filled 2014-01-21: qty 34

## 2014-01-21 MED ORDER — SODIUM CHLORIDE 0.9 % IV SOLN
Freq: Once | INTRAVENOUS | Status: AC
  Administered 2014-01-21: 15:00:00 via INTRAVENOUS

## 2014-01-21 NOTE — Patient Instructions (Signed)

## 2014-02-20 ENCOUNTER — Ambulatory Visit: Payer: BC Managed Care – PPO | Admitting: Family

## 2014-03-02 ENCOUNTER — Telehealth: Payer: Self-pay | Admitting: Family

## 2014-03-02 ENCOUNTER — Ambulatory Visit (INDEPENDENT_AMBULATORY_CARE_PROVIDER_SITE_OTHER): Payer: BC Managed Care – PPO | Admitting: Family

## 2014-03-02 ENCOUNTER — Encounter: Payer: Self-pay | Admitting: Family

## 2014-03-02 VITALS — BP 110/70 | HR 67 | Temp 98.1°F | Ht 65.0 in | Wt 186.0 lb

## 2014-03-02 DIAGNOSIS — R300 Dysuria: Secondary | ICD-10-CM | POA: Insufficient documentation

## 2014-03-02 DIAGNOSIS — R5381 Other malaise: Secondary | ICD-10-CM

## 2014-03-02 DIAGNOSIS — R5383 Other fatigue: Secondary | ICD-10-CM | POA: Insufficient documentation

## 2014-03-02 DIAGNOSIS — R5382 Chronic fatigue, unspecified: Secondary | ICD-10-CM

## 2014-03-02 DIAGNOSIS — E785 Hyperlipidemia, unspecified: Secondary | ICD-10-CM

## 2014-03-02 DIAGNOSIS — E119 Type 2 diabetes mellitus without complications: Secondary | ICD-10-CM

## 2014-03-02 DIAGNOSIS — E875 Hyperkalemia: Secondary | ICD-10-CM

## 2014-03-02 LAB — POCT URINALYSIS DIPSTICK
Glucose, UA: NEGATIVE
KETONES UA: NEGATIVE
Nitrite, UA: NEGATIVE
Protein, UA: NEGATIVE
SPEC GRAV UA: 1.015
UROBILINOGEN UA: NEGATIVE
pH, UA: 6.5

## 2014-03-02 MED ORDER — FLUOXETINE HCL 10 MG PO TABS: ORAL_TABLET | ORAL | Status: DC

## 2014-03-02 MED ORDER — DULOXETINE HCL 30 MG PO CPEP: ORAL_CAPSULE | ORAL | Status: DC

## 2014-03-02 NOTE — Patient Instructions (Addendum)
Please cut atenolol in half and take 1/2 tab by mouth daily. Decrease prozac to 20mg  for 1 week, then 10 mg for 1 week then stop. Start cymbalta 30mg  for 1 week, then increase to 60mg  once daily for 1 week. Follow up in 1 month.

## 2014-03-02 NOTE — Progress Notes (Signed)
Subjective:    Patient ID: Michele [name], female    DOB: April 26, 1971, 43 y.o.   MRN: [id number]  HPI  Pt presents today to discuss lab work. She reports that her potassium and her cholesterol was elevated when drawn at a health fair at her work.   She was advised to follow up with PCP.   Dysuria- She also reports some mild dysuria.    Fatigue- Fatigue is ongoing. Reports feeling "non-functional" due to fatigue.  Feels like depression is well controlled. Missing work, concerned about her job.   Reports + compliance with CPAP.  Reports some myalgias.  Recently started on gabapentin by Neurology. She is titrating up on the dose.  Does not feel more tired now than she did prior to starting the medication.  She is maintained on synthroid which she tells me has most recently been written by her hematologist, and her last TSH was normal.    Tachycardia-  She is maintained on a beta blocker.  She was on propranolol at one point but tells me that Dr. Marin Olp changed her to atenolol instead.   Reports she has some numbness left lateral foot. She reports + compliance with cpap.  On gabapentin.    She sees dr. Trula Ore- neurology. She reports that the numbness in her left lateral foot which started 2 weeks ago.   She reports concern re: weight gain.  She is not counting calories.  Weight is down one pound since march.   Wt Readings from Last 3 Encounters:  03/02/14 186 lb (84.369 kg)  01/14/14 183 lb (83.008 kg)  12/02/13 187 lb (84.823 kg)   Dr. Marin Olp placed her on synthroid.   Review of Systems See HPI  Past Medical History  Diagnosis Date  . Depression   . Hypertension   . Anemia     iron deficient, microcytic, hypochromic  . Anxiety   . Migraines   . 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 Fibroids     uterine  . Palpitations     recurrent  . Fatigue   . Tachycardia     unspecified  . Chest pain     atypical  . 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 Posey Pronto at [facility]- S/p FNA March/april of two dominant nodules- benign.  Following for annual thyroid US with Dr. Posey Pronto.    . Diabetes mellitus without complication   . Fatty liver 08/15/2013    History   Social History  . Marital Status: Married    Spouse Name: [name]    Number of Children: 2  . Years of Education: N/A   Occupational History  . 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 Smoking status: Never Smoker   . Smokeless tobacco: Never Used     Comment: never used tobacco  . Alcohol Use: Yes     Comment: Once a month  . Drug Use: No  . Sexual Activity: Yes    Birth Control/ Protection: None, Surgical   Other Topics Concern  . 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 Appendectomy  03/2009  . Tubal ligation  1997  . Tuboplasty /  tubotubal anastomosis  2005  . Biopsy thyroid  November 28, 2009    Dr. Posey Pronto- endo  . Dilation and curettage of uterus  05/21/2011    Procedure: DILATATION AND CURETTAGE (D&C);  Surgeon: Elveria Royals;  Location: [facility];  Service: Gynecology;  Laterality: N/A;  dilitation and currettage/endometrial currettings    Family History  Problem Relation Age of Onset  . Hypertension Mother   . Cancer Mother 28    breast  . Cancer Father     colon  . Stroke Brother     handicapped due to complications from spinal meningitis  . Seizures Brother   . Asthma Son   . Arthritis Maternal Grandmother   . Hypertension Maternal Grandmother   . Stroke Maternal Grandfather     Allergies  Allergen Reactions  . Amlodipine Shortness Of Breath and Palpitations  . Bee Venom   . Wellbutrin [Bupropion Hcl] Other (See Comments)    Dizziness, increased anxiety    Current Outpatient Prescriptions on File Prior to Visit    Medication Sig Dispense Refill  . ALPRAZolam (XANAX) 1 MG tablet Take 1 mg by mouth as needed. For anxiety      . atenolol (TENORMIN) 25 MG tablet Take 1 tablet (25 mg total) by mouth daily.  30 tablet  4  . EPIPEN 2-PAK 0.3 MG/0.3ML SOAJ injection as needed.       Marland Kitchen ibuprofen (ADVIL) 600 MG tablet Take 1 tablet (600 mg total) by mouth every 6 (six) hours as needed for pain.  60 tablet  0  . levothyroxine (SYNTHROID) 50 MCG tablet Take 1 tablet (50 mcg total) by mouth daily before breakfast.  30 tablet  3  . losartan (COZAAR) 100 MG tablet Take 1 tablet (100 mg total) by mouth daily.  90 tablet  3  . spironolactone (ALDACTONE) 25 MG tablet Take 1 tablet (25 mg total) by mouth daily.  30 tablet  5  . acetaminophen (TYLENOL) 500 MG tablet Take 1,000-1,500 mg by mouth every 6 (six) hours as needed. For headache      . [DISCONTINUED] buPROPion (WELLBUTRIN XL) 150 MG 24 hr tablet Take 2 tablets (300 mg total) by mouth daily.  60 tablet  1   No current facility-administered medications on file prior to visit.    BP 110/70  Pulse 67  Temp(Src) 98.1 F (36.7 C) (Oral)  Ht 5\' 5"  (1.651 m)  Wt 186 lb (84.369 kg)  BMI 30.95 kg/m2  SpO2 97%       Objective:   Physical Exam  Constitutional: She is oriented to person, place, and time. She appears well-developed and well-nourished. No distress.  Tired appearing white female.  Cardiovascular: Normal rate and regular rhythm.   No murmur heard. Pulmonary/Chest: Effort normal and breath sounds normal. No respiratory distress. She has no wheezes. She has no rales. She exhibits no tenderness.  Musculoskeletal: She exhibits no edema.  Tender in 10/18 fibromyalgia points.   Neurological: She is alert and oriented to person, place, and time.          Assessment & Plan:  Foot numbness- I advised her to follow up with her neurologist regarding this.   Will have pt return to lab for follow up bmet and cholesterol.

## 2014-03-02 NOTE — Progress Notes (Signed)
Pre visit review using our clinic review tool, if applicable. No additional management support is needed unless otherwise documented below in the visit note. 

## 2014-03-02 NOTE — Assessment & Plan Note (Addendum)
Etiology is not entirely clear.  I suspect that cause is multifactorial.  We did discuss that fibromyalgia is a possibility and that gabapentin is one treatment for the pain. She is currently in the process of titrating up.  Also, her OSA could be a contributing factor, but she reports + compliance with CPAP and her OSA is only mild.  Could be side effect of beta blocker or SSRI. I have asked her to cut her atenolol in half to see if this helps.  She was taking atenolol due to hx of tachycardia, so we will need to monitor her heart rate.    Due to potential dx or FM, will try weaning off of prozac and placing on cymbalta.  I advised her to wait a few weeks until she has titrated up her gabapentin prior to doing this.  I want to make sure that we know how she feels on gabapentin before she attempts additional changes.   30 minutes spent with pt today.  >50% of this time was spent counseling pt on her anxiety/depression and fatigue.

## 2014-03-02 NOTE — Telephone Encounter (Signed)
See my chart message

## 2014-03-02 NOTE — Assessment & Plan Note (Signed)
Will send urine for culture. Urinalysis is positive for trace blood only.

## 2014-03-04 ENCOUNTER — Other Ambulatory Visit: Payer: Self-pay | Admitting: Family

## 2014-03-04 LAB — URINE CULTURE
Colony Count: NO GROWTH
Organism ID, Bacteria: NO GROWTH

## 2014-03-05 ENCOUNTER — Encounter: Payer: Self-pay | Admitting: Family

## 2014-03-09 ENCOUNTER — Ambulatory Visit: Payer: BC Managed Care – PPO | Admitting: Family Medicine

## 2014-03-16 ENCOUNTER — Ambulatory Visit: Payer: BC Managed Care – PPO | Admitting: Family

## 2014-03-16 ENCOUNTER — Telehealth: Payer: Self-pay | Admitting: Hematology & Oncology

## 2014-03-16 ENCOUNTER — Other Ambulatory Visit: Payer: BC Managed Care – PPO | Admitting: Lab

## 2014-03-16 NOTE — Telephone Encounter (Signed)
Patient called and cx apt due to being out of town.  She will call back to resch

## 2014-03-23 ENCOUNTER — Encounter: Payer: Self-pay | Admitting: Family

## 2014-03-23 ENCOUNTER — Ambulatory Visit (INDEPENDENT_AMBULATORY_CARE_PROVIDER_SITE_OTHER): Payer: BC Managed Care – PPO | Admitting: Family

## 2014-03-23 VITALS — BP 138/90 | HR 71 | Temp 98.0°F | Resp 16 | Wt 192.1 lb

## 2014-03-23 DIAGNOSIS — G43901 Migraine, unspecified, not intractable, with status migrainosus: Secondary | ICD-10-CM

## 2014-03-23 MED ORDER — PROMETHAZINE HCL 25 MG/ML IJ SOLN: 25.0000 mg | Freq: Once | INTRAMUSCULAR | Status: DC

## 2014-03-23 MED ORDER — KETOROLAC TROMETHAMINE 30 MG/ML IJ SOLN
60.0000 mg | Freq: Once | INTRAMUSCULAR | Status: AC
  Administered 2014-03-23: 60 mg via INTRAMUSCULAR

## 2014-03-23 MED ORDER — PROMETHAZINE HCL 25 MG/ML IJ SOLN
25.0000 mg | Freq: Once | INTRAMUSCULAR | Status: AC
  Administered 2014-03-23: 25 mg via INTRAMUSCULAR

## 2014-03-23 MED ORDER — SUMATRIPTAN SUCCINATE 50 MG PO TABS: ORAL_TABLET | ORAL | Status: DC

## 2014-03-23 NOTE — Progress Notes (Signed)
Subjective:    Patient ID: Michele [name], female    DOB: 06-13-1971, 43 y.o.   MRN: [id number]  HPI  [name] is 43 yr old female with hx of complicated migraines who presents today with chief complaint of headache. She reports HA has been present x 1 month.  Reports associated blurred vision and drooping of the left side of the face.   Reports that she cannot get in to see neurology until the end of the month.  Missed 3 days of work last week.  Reports blurred vision in the left eye.  + nausea/vomitting this AM at work.  + photophobia  Review of Systems See HPI  Past Medical History  Diagnosis Date  . Depression   . Hypertension   . Anemia     iron deficient, microcytic, hypochromic  . Anxiety   . Migraines   . 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 Fibroids     uterine  . Palpitations     recurrent  . Fatigue   . Tachycardia     unspecified  . Chest pain     atypical  . 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 Posey Pronto at [facility]- S/p FNA March/april of two dominant nodules- benign.  Following for annual thyroid US with Dr. Posey Pronto.    . Diabetes mellitus without complication   . Fatty liver 08/15/2013    History   Social History  . Marital Status: Married    Spouse Name: [name]    Number of Children: 2  . Years of Education: N/A   Occupational History  . 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 Smoking status: Never Smoker   . Smokeless tobacco: Never Used     Comment: never used tobacco  . Alcohol Use: Yes     Comment: Once a month  . Drug Use: No  . Sexual Activity: Yes    Birth Control/ Protection: None, Surgical   Other Topics Concern  . 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  Appendectomy  03/2009  . Tubal ligation  1997  . Tuboplasty / tubotubal anastomosis  2005  . Biopsy thyroid  November 28, 2009    Dr. Posey Pronto- endo  . Dilation and curettage of uterus  05/21/2011    Procedure: DILATATION AND CURETTAGE (D&C);  Surgeon: Elveria Royals;  Location: [facility];  Service: Gynecology;  Laterality: N/A;  dilitation and currettage/endometrial currettings    Family History  Problem Relation Age of Onset  . Hypertension Mother   . Cancer Mother 30    breast  . Cancer Father     colon  . Stroke Brother     handicapped due to complications from spinal meningitis  . Seizures Brother   . Asthma Son   . Arthritis Maternal Grandmother   . Hypertension Maternal Grandmother   . Stroke Maternal Grandfather     Allergies  Allergen Reactions  . Amlodipine Shortness Of Breath and Palpitations  . Bee Venom   . Wellbutrin [Bupropion Hcl] Other (See Comments)    Dizziness, increased anxiety    Current Outpatient Prescriptions on File Prior to Visit  Medication Sig Dispense Refill  . acetaminophen (TYLENOL) 500 MG tablet Take 1,000-1,500 mg by mouth every 6 (six) hours as needed. For headache      . ALPRAZolam (XANAX) 1 MG tablet Take 1 mg by mouth as needed. For anxiety      . atenolol (TENORMIN) 25 MG tablet Take 1 tablet (25 mg total) by mouth daily.  30 tablet  4  . DULoxetine (CYMBALTA) 30 MG capsule One cap by mouth daily for 1 week, then increase to 2 caps by mouth daily on week two  60 capsule  1  . EPIPEN 2-PAK 0.3 MG/0.3ML SOAJ injection as needed.       Marland Kitchen FLUoxetine (PROZAC) 10 MG tablet 2 tabs daily for 1 week, then 1 tab daily for 1 week then stop.  30 tablet  0  . gabapentin (NEURONTIN) 100 MG capsule Take 300 mg by mouth at bedtime.       Marland Kitchen ibuprofen (ADVIL) 600 MG tablet Take 1 tablet (600 mg total) by mouth every 6 (six) hours as needed for pain.  60 tablet  0  . levothyroxine (SYNTHROID) 50 MCG tablet Take 1 tablet (50 mcg total) by mouth daily before  breakfast.  30 tablet  3  . losartan (COZAAR) 100 MG tablet Take 1 tablet (100 mg total) by mouth daily.  90 tablet  3  . spironolactone (ALDACTONE) 25 MG tablet Take 1 tablet (25 mg total) by mouth daily.  30 tablet  5  . [DISCONTINUED] buPROPion (WELLBUTRIN XL) 150 MG 24 hr tablet Take 2 tablets (300 mg total) by mouth daily.  60 tablet  1   No current facility-administered medications on file prior to visit.    BP 138/90  Pulse 71  Temp(Src) 98 F (36.7 C) (Oral)  Resp 16  Wt 192 lb 1.3 oz (87.127 kg)  SpO2 99%       Objective:   Physical Exam  Constitutional: She is oriented to person, place, and time. She appears well-developed and well-nourished. No distress.  HENT:  Head: Normocephalic and atraumatic.  Eyes: EOM are normal. Pupils are equal, round, and reactive to light.  Left eyelid is drooping.   Cardiovascular: Normal rate and regular rhythm.   No murmur heard. Pulmonary/Chest: Effort normal and breath sounds normal. No respiratory distress. She has no wheezes. She has no rales. She exhibits no tenderness.  Neurological: She is alert and oriented to person, place, and time.  Left side of face with mild swelling of left eyelid, mild drooping of left cheek.    Bilateral UE/LE strength is 5/5          Assessment & Plan:

## 2014-03-23 NOTE — Patient Instructions (Signed)
Take imitrex 50mg  tab.  Repeat in 2 hours if headache persists. Max 2 tabs/24 hrs. Call if symptoms worsen or if symptoms do not improve. Keep your upcoming appointment with neurology. Go to ER if you develop slurred speech, arm/leg numbness/weakness, worsening Headache of facial droop worsens.

## 2014-03-23 NOTE — Progress Notes (Signed)
Pre visit review using our clinic review tool, if applicable. No additional management support is needed unless otherwise documented below in the visit note. 

## 2014-03-26 NOTE — Assessment & Plan Note (Signed)
Deteriorated.  Add imitrex, IM toradol and IM Phenergan given in office. Pt instructed to call if symptoms worsen or if symptoms do not improve. Keep your upcoming appointment with neurology. Go to ER if you develop slurred speech, arm/leg numbness/weakness, worsening Headache of facial droop worsens.

## 2014-03-30 ENCOUNTER — Ambulatory Visit: Payer: BC Managed Care – PPO | Admitting: Family

## 2014-04-23 ENCOUNTER — Ambulatory Visit
Admission: RE | Admit: 2014-04-23 | Discharge: 2014-04-23 | Disposition: A | Discharge status: Home / Self Care | Payer: BC Managed Care – PPO | Source: Ambulatory Visit | Attending: Gastroenterology | Admitting: Gastroenterology

## 2014-04-23 ENCOUNTER — Other Ambulatory Visit: Payer: Self-pay | Admitting: Gastroenterology

## 2014-04-23 DIAGNOSIS — K5900 Constipation, unspecified: Secondary | ICD-10-CM

## 2014-04-23 DIAGNOSIS — R109 Unspecified abdominal pain: Secondary | ICD-10-CM

## 2014-04-26 ENCOUNTER — Emergency Department (HOSPITAL_BASED_OUTPATIENT_CLINIC_OR_DEPARTMENT_OTHER)
Admission: EM | Admit: 2014-04-26 | Discharge: 2014-04-26 | Disposition: A | Discharge status: Home / Self Care | Payer: BC Managed Care – PPO | Attending: Emergency Medicine | Admitting: Emergency Medicine

## 2014-04-26 ENCOUNTER — Encounter (HOSPITAL_BASED_OUTPATIENT_CLINIC_OR_DEPARTMENT_OTHER): Payer: Self-pay | Admitting: Emergency Medicine

## 2014-04-26 ENCOUNTER — Emergency Department (HOSPITAL_BASED_OUTPATIENT_CLINIC_OR_DEPARTMENT_OTHER): Payer: BC Managed Care – PPO

## 2014-04-26 DIAGNOSIS — I10 Essential (primary) hypertension: Secondary | ICD-10-CM | POA: Diagnosis not present

## 2014-04-26 DIAGNOSIS — F3289 Other specified depressive episodes: Secondary | ICD-10-CM | POA: Insufficient documentation

## 2014-04-26 DIAGNOSIS — F329 Major depressive disorder, single episode, unspecified: Secondary | ICD-10-CM | POA: Insufficient documentation

## 2014-04-26 DIAGNOSIS — Z8709 Personal history of other diseases of the respiratory system: Secondary | ICD-10-CM | POA: Insufficient documentation

## 2014-04-26 DIAGNOSIS — G43909 Migraine, unspecified, not intractable, without status migrainosus: Secondary | ICD-10-CM | POA: Diagnosis not present

## 2014-04-26 DIAGNOSIS — E119 Type 2 diabetes mellitus without complications: Secondary | ICD-10-CM | POA: Insufficient documentation

## 2014-04-26 DIAGNOSIS — Z7982 Long term (current) use of aspirin: Secondary | ICD-10-CM | POA: Diagnosis not present

## 2014-04-26 DIAGNOSIS — Z79899 Other long term (current) drug therapy: Secondary | ICD-10-CM | POA: Insufficient documentation

## 2014-04-26 DIAGNOSIS — F411 Generalized anxiety disorder: Secondary | ICD-10-CM | POA: Diagnosis not present

## 2014-04-26 DIAGNOSIS — Z862 Personal history of diseases of the blood and blood-forming organs and certain disorders involving the immune mechanism: Secondary | ICD-10-CM | POA: Diagnosis not present

## 2014-04-26 DIAGNOSIS — K5900 Constipation, unspecified: Secondary | ICD-10-CM

## 2014-04-26 DIAGNOSIS — Z8742 Personal history of other diseases of the female genital tract: Secondary | ICD-10-CM | POA: Insufficient documentation

## 2014-04-26 MED ORDER — LIDOCAINE HCL 2 % EX GEL
CUTANEOUS | Status: AC
  Filled 2014-04-26: qty 20

## 2014-04-26 MED ORDER — MINERAL OIL RE ENEM
ENEMA | RECTAL | Status: AC
  Administered 2014-04-26: 1.015 via RECTAL
  Filled 2014-04-26: qty 1

## 2014-04-26 MED ORDER — FLEET ENEMA 7-19 GM/118ML RE ENEM: 1.0000 | ENEMA | Freq: Once | RECTAL | Status: DC

## 2014-04-26 MED ORDER — MAGNESIUM CITRATE PO SOLN
1.0000 | Freq: Once | ORAL | Status: DC
  Filled 2014-04-26: qty 296

## 2014-04-26 MED ORDER — POLYETHYLENE GLYCOL 3350 17 G PO PACK: 17.0000 g | PACK | Freq: Every day | ORAL | Status: DC

## 2014-04-26 MED ORDER — MINERAL OIL RE ENEM
1.0000 | ENEMA | Freq: Once | RECTAL | Status: DC
  Administered 2014-04-26: 1.015 via RECTAL

## 2014-04-26 NOTE — ED Notes (Signed)
Patient had an MRI with contrast last week, had loose dark stools until Sunday. Has not had a BM since Sunday. Patient has a GI dr and was given medication for constipation. Nothing she has taken has helped. She has also tried several OTC options. Her abd is bloated and painful. This morning patient vomited once.

## 2014-04-26 NOTE — Discharge Instructions (Signed)
Return to the ED with any concerns including vomiting and not able to keep down liquids, fever/chills, blood in stool, decreased level of alertness/lethargy, or any other alarming symptoms

## 2014-04-26 NOTE — ED Provider Notes (Signed)
CSN: [id number]     Arrival date & time 04/26/14  5465 History   First MD Initiated Contact with Patient 04/26/14 [phone]     Chief Complaint  Patient presents with  . Constipation     (Consider location/radiation/quality/duration/timing/severity/associated sxs/prior Treatment) HPI Pt presents with c/o constipation.  She states it has been one week since she had a bowel movement.  She saw her GI doctor who recommended miralax, also gave rx for linzess.  Pt states her abdomen feels bloated and she is having cramping pains.  She has taken several OTC teas, tried miralax one day last week- multiple doses.  This morning patient had one episode of emesis.  Emesis nonbloody and nonibilious. Pt denies being on narcotic pain meds recently.  There are no other associated systemic symptoms, there are no other alleviating or modifying factors.   Past Medical History  Diagnosis Date  . Depression   . Hypertension   . Anemia     iron deficient, microcytic, hypochromic  . Anxiety   . Migraines   . 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 Fibroids     uterine  . Palpitations     recurrent  . Fatigue   . Tachycardia     unspecified  . Chest pain     atypical  . 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 Posey Pronto at [facility]- S/p FNA March/april of two dominant nodules- benign.  Following for annual thyroid US with Dr. Posey Pronto.    . Diabetes mellitus without complication   . Fatty liver 08/15/2013   Past Surgical History  Procedure Laterality Date  . Appendectomy  03/2009  . Tubal ligation  1997  . Tuboplasty / tubotubal anastomosis  2005  . Biopsy thyroid  November 28, 2009    Dr. Posey Pronto- endo  . Dilation and curettage of uterus  05/21/2011    Procedure: DILATATION AND CURETTAGE (D&C);  Surgeon: Elveria Royals;  Location: [facility];  Service: Gynecology;  Laterality: N/A;  dilitation and currettage/endometrial currettings   Family History  Problem Relation Age of Onset  . Hypertension  Mother   . Cancer Mother 65    breast  . Cancer Father     colon  . Stroke Brother     handicapped due to complications from spinal meningitis  . Seizures Brother   . Asthma Son   . Arthritis Maternal Grandmother   . Hypertension Maternal Grandmother   . Stroke Maternal Grandfather    History  Substance Use Topics  . Smoking status: Never Smoker   . Smokeless tobacco: Never Used     Comment: never used tobacco  . Alcohol Use: Yes     Comment: Once a month   OB History   Grav Para Term Preterm Abortions TAB SAB Ect Mult Living   4 2 1 1 2 1  1  2      Review of Systems ROS reviewed and all otherwise negative except for mentioned in HPI    Allergies  Amlodipine; Bee venom; and Bupropion  Home Medications   Prior to Admission medications   Medication Sig Start Date End Date Taking? Authorizing Provider  ALPRAZolam Duanne Moron) 1 MG tablet Take 1 mg by mouth as needed. For anxiety    Historical Provider, MD  aspirin 81 MG tablet Take 81 mg by mouth at bedtime.     Historical Provider, MD  Biotin 2.5 MG TABS Take 2,500 mcg by mouth daily.  Historical Provider, MD  cyanocobalamin (,VITAMIN B-12,) 1000 MCG/ML injection Inject 1 mL into the muscle every 30 (thirty) days. 04/15/14   Historical Provider, MD  EPIPEN 2-PAK 0.3 MG/0.3ML SOAJ injection as needed.  05/14/13   Historical Provider, MD  FLUoxetine (PROZAC) 40 MG capsule Take 40 mg by mouth every morning.    Historical Provider, MD  gabapentin (NEURONTIN) 100 MG capsule Take 300 mg by mouth at bedtime.     Historical Provider, MD  losartan (COZAAR) 100 MG tablet Take 100 mg by mouth at bedtime.    Historical Provider, MD  polyethylene glycol (MIRALAX / GLYCOLAX) packet Take 17 g by mouth daily as needed for moderate constipation.    Historical Provider, MD  spironolactone (ALDACTONE) 25 MG tablet Take 25 mg by mouth at bedtime.    Historical Provider, MD  SUMAtriptan (IMITREX) 50 MG tablet Take one tab by mouth now, may repeat  in 2 hours if symptoms are not resolved. 03/23/14   Debbrah Alar, NP   BP 126/72  Pulse 84  Temp(Src) 98.8 F (37.1 C) (Oral)  Resp 18  Ht 5\' 5"  (1.651 m)  Wt 190 lb (86.183 kg)  BMI 31.62 kg/m2  SpO2 100% Vitals reviewed Physical Exam Physical Examination: General appearance - alert, well appearing, and in no distress Mental status - alert, oriented to person, place, and time Eyes - no conjunctival injection, no scleral icterus Mouth - mucous membranes moist, pharynx normal without lesions Chest - clear to auscultation, no wheezes, rales or rhonchi, symmetric air entry Heart - normal rate, regular rhythm, normal S1, S2, no murmurs, rubs, clicks or gallops Abdomen - soft, diffuse tenderness to palpation, no gaurding or rebound, mild distended, no masses or organomegaly, nabs Extremities - peripheral pulses normal, no pedal edema, no clubbing or cyanosis Skin - normal coloration and turgor, no rashes  ED Course  Procedures (including critical care time)  8:55 AM pt being taken to xray, will see when she returns Labs Review Labs Reviewed - No data to display  Imaging Review Dg Abd Acute W/chest  04/29/2014   CLINICAL DATA:  Chest pain, abdominal pain, constipation  EXAM: ACUTE ABDOMEN SERIES (ABDOMEN 2 VIEW & CHEST 1 VIEW)  COMPARISON:  04/26/2014  FINDINGS: Cardiomediastinal silhouette is stable. No acute infiltrate or pleural effusion. No pulmonary edema. There is nonspecific nonobstructive bowel gas pattern. No free abdominal air. Moderate stool in transverse colon.  IMPRESSION: Negative abdominal radiographs. No acute cardiopulmonary disease. Moderate colonic stool transverse colon.   Electronically Signed   By: Lahoma Crocker M.D.   On: 04/29/2014 13:59     EKG Interpretation None      MDM   Final diagnoses:  Constipation, unspecified constipation type    Pt preesenting with c/o constipation.  No obstruction on xray but large stool burden.   Xray images reviewed and  interpreted by me as well.  Pt given enema in the ED without result.  She was encouraged to take miralax daily and increase her dose gradually until she has loose stool, then back down.  Also continue talking linzess.  Pt offered mag citrate she states she does not wish to stay in the ED unless we are sure.  I have advised her to arrange for a followup with Dr. Collene Mares. Discharged with strict return precautions.  Pt agreeable with plan.     Threasa Beards, MD 04/30/14 [phone]

## 2014-04-29 ENCOUNTER — Emergency Department (HOSPITAL_COMMUNITY)
Admission: EM | Admit: 2014-04-29 | Discharge: 2014-04-29 | Disposition: A | Discharge status: Home / Self Care | Payer: BC Managed Care – PPO | Attending: Emergency Medicine | Admitting: Emergency Medicine

## 2014-04-29 ENCOUNTER — Encounter (HOSPITAL_COMMUNITY): Payer: Self-pay | Admitting: Emergency Medicine

## 2014-04-29 ENCOUNTER — Emergency Department (HOSPITAL_COMMUNITY): Payer: BC Managed Care – PPO

## 2014-04-29 DIAGNOSIS — F3289 Other specified depressive episodes: Secondary | ICD-10-CM | POA: Diagnosis not present

## 2014-04-29 DIAGNOSIS — R109 Unspecified abdominal pain: Secondary | ICD-10-CM | POA: Insufficient documentation

## 2014-04-29 DIAGNOSIS — K5900 Constipation, unspecified: Secondary | ICD-10-CM | POA: Insufficient documentation

## 2014-04-29 DIAGNOSIS — Z79899 Other long term (current) drug therapy: Secondary | ICD-10-CM | POA: Diagnosis not present

## 2014-04-29 DIAGNOSIS — F411 Generalized anxiety disorder: Secondary | ICD-10-CM | POA: Insufficient documentation

## 2014-04-29 DIAGNOSIS — R079 Chest pain, unspecified: Secondary | ICD-10-CM | POA: Diagnosis present

## 2014-04-29 DIAGNOSIS — Z8542 Personal history of malignant neoplasm of other parts of uterus: Secondary | ICD-10-CM | POA: Insufficient documentation

## 2014-04-29 DIAGNOSIS — R110 Nausea: Secondary | ICD-10-CM | POA: Diagnosis not present

## 2014-04-29 DIAGNOSIS — Z862 Personal history of diseases of the blood and blood-forming organs and certain disorders involving the immune mechanism: Secondary | ICD-10-CM | POA: Diagnosis not present

## 2014-04-29 DIAGNOSIS — I10 Essential (primary) hypertension: Secondary | ICD-10-CM | POA: Diagnosis not present

## 2014-04-29 DIAGNOSIS — Z8669 Personal history of other diseases of the nervous system and sense organs: Secondary | ICD-10-CM | POA: Insufficient documentation

## 2014-04-29 DIAGNOSIS — F329 Major depressive disorder, single episode, unspecified: Secondary | ICD-10-CM | POA: Diagnosis not present

## 2014-04-29 DIAGNOSIS — E119 Type 2 diabetes mellitus without complications: Secondary | ICD-10-CM | POA: Diagnosis not present

## 2014-04-29 DIAGNOSIS — E042 Nontoxic multinodular goiter: Secondary | ICD-10-CM | POA: Insufficient documentation

## 2014-04-29 DIAGNOSIS — R0789 Other chest pain: Secondary | ICD-10-CM | POA: Diagnosis not present

## 2014-04-29 LAB — CBC WITH DIFFERENTIAL/PLATELET
BASOS PCT: 0 % (ref 0–1)
Basophils Absolute: 0 10*3/uL (ref 0.0–0.1)
EOS ABS: 0 10*3/uL (ref 0.0–0.7)
Eosinophils Relative: 0 % (ref 0–5)
HCT: 42.5 % (ref 36.0–46.0)
HEMOGLOBIN: 14.5 g/dL (ref 12.0–15.0)
Lymphocytes Relative: 18 % (ref 12–46)
Lymphs Abs: 2.6 10*3/uL (ref 0.7–4.0)
MCH: 31 pg (ref 26.0–34.0)
MCHC: 34.1 g/dL (ref 30.0–36.0)
MCV: 91 fL (ref 78.0–100.0)
MONOS PCT: 8 % (ref 3–12)
Monocytes Absolute: 1.2 10*3/uL — ABNORMAL HIGH (ref 0.1–1.0)
Neutro Abs: 11.1 10*3/uL — ABNORMAL HIGH (ref 1.7–7.7)
Neutrophils Relative %: 74 % (ref 43–77)
Platelets: 153 10*3/uL (ref 150–400)
RBC: 4.67 MIL/uL (ref 3.87–5.11)
RDW: 14.1 % (ref 11.5–15.5)
WBC: 15 10*3/uL — ABNORMAL HIGH (ref 4.0–10.5)

## 2014-04-29 LAB — COMPREHENSIVE METABOLIC PANEL
ALBUMIN: 3.7 g/dL (ref 3.5–5.2)
ALT: 43 U/L — ABNORMAL HIGH (ref 0–35)
AST: 17 U/L (ref 0–37)
Alkaline Phosphatase: 57 U/L (ref 39–117)
Anion gap: 15 (ref 5–15)
BUN: 17 mg/dL (ref 6–23)
CALCIUM: 9.6 mg/dL (ref 8.4–10.5)
CO2: 29 mEq/L (ref 19–32)
Chloride: 97 mEq/L (ref 96–112)
Creatinine, Ser: 0.74 mg/dL (ref 0.50–1.10)
GFR calc Af Amer: >90 mL/min (ref >90)
GFR calc non Af Amer: >90 mL/min (ref >90)
Glucose, Bld: 95 mg/dL (ref 70–99)
POTASSIUM: 4.9 meq/L (ref 3.7–5.3)
Sodium: 141 mEq/L (ref 137–147)
TOTAL PROTEIN: 7 g/dL (ref 6.0–8.3)
Total Bilirubin: 0.7 mg/dL (ref 0.3–1.2)

## 2014-04-29 LAB — I-STAT TROPONIN, ED
TROPONIN I, POC: 0 ng/mL (ref 0.00–0.08)
Troponin i, poc: 0 ng/mL (ref 0.00–0.08)

## 2014-04-29 LAB — LIPASE, BLOOD: LIPASE: 45 U/L (ref 11–59)

## 2014-04-29 NOTE — ED Provider Notes (Signed)
CSN: [id number]     Arrival date & time 04/29/14  1246 History   First MD Initiated Contact with Patient 04/29/14 1302     Chief Complaint  Patient presents with  . Chest Pain  . Abdominal Pain    Known bowel obstruction     (Consider location/radiation/quality/duration/timing/severity/associated sxs/prior Treatment) HPI Comments: Patient is a 43 year old female with history of hypertension, depression, anemia, diabetes who presents to the ED for evaluation of chest pain. She reports that her symptoms began at 1045 this morning. She describes her chest pain as "getting kicked in the chest by a horse". She has associated shortness of breath, nausea, lightheadedness. This lasted approximately one hour. Her chest pain, however has been constant since 1045. She reports pain is worse with deep inspiration. Her chest pain radiates through her side and around to her back. She has been shoveling with constipation for the past few weeks. She sees Dr. Collene Mares. She has a cardiologist and sees Dr. Burt Knack for her hypertension. She last saw him 6-8 months ago. She's had prior stress test, echo. She had a cardiac catheterization 2-1/2 years ago. At that time it was found to be unremarkable. She has positive family history of early heart disease with her grandfather having an MI in his 51s. She does not smoke cigarettes. No long trips, recent surgeries. No history of cancer. She is not on any exogenous estrogen.  The history is provided by the patient. No language interpreter was used.    Past Medical History  Diagnosis Date  . Depression   . Hypertension   . Anemia     iron deficient, microcytic, hypochromic  . Anxiety   . Migraines   . 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 Fibroids     uterine  . Palpitations     recurrent  . Fatigue   . Tachycardia     unspecified  . Chest pain     atypical  . 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 Posey Pronto at [facility]- S/p FNA March/april of  two dominant nodules- benign.  Following for annual thyroid US with Dr. Posey Pronto.    . Diabetes mellitus without complication   . Fatty liver 08/15/2013   Past Surgical History  Procedure Laterality Date  . Appendectomy  03/2009  . Tubal ligation  1997  . Tuboplasty / tubotubal anastomosis  2005  . Biopsy thyroid  November 28, 2009    Dr. Posey Pronto- endo  . Dilation and curettage of uterus  05/21/2011    Procedure: DILATATION AND CURETTAGE (D&C);  Surgeon: Elveria Royals;  Location: [facility];  Service: Gynecology;  Laterality: N/A;  dilitation and currettage/endometrial currettings   Family History  Problem Relation Age of Onset  . Hypertension Mother   . Cancer Mother 99    breast  . Cancer Father     colon  . Stroke Brother     handicapped due to complications from spinal meningitis  . Seizures Brother   . Asthma Son   . Arthritis Maternal Grandmother   . Hypertension Maternal Grandmother   . Stroke Maternal Grandfather    History  Substance Use Topics  . Smoking status: Never Smoker   . Smokeless tobacco: Never Used     Comment: never used tobacco  . Alcohol Use: Yes     Comment: Once a month   OB History   Grav Para Term Preterm Abortions TAB SAB Ect Mult Living  4 2 1 1 2 1  1  2      Review of Systems  Constitutional: Negative for fever and chills.  Respiratory: Positive for shortness of breath.   Cardiovascular: Positive for chest pain.  Gastrointestinal: Positive for nausea. Negative for vomiting.  Neurological: Positive for light-headedness.  All other systems reviewed and are negative.     Allergies  Amlodipine; Bee venom; and Wellbutrin  Home Medications   Prior to Admission medications   Medication Sig Start Date End Date Taking? Authorizing Provider  acetaminophen (TYLENOL) 500 MG tablet Take 1,000-1,500 mg by mouth every 6 (six) hours as needed. For headache    Historical Provider, MD  ALPRAZolam Duanne Moron) 1 MG tablet Take 1 mg by mouth as needed. For anxiety     Historical Provider, MD  atenolol (TENORMIN) 25 MG tablet Take 1 tablet (25 mg total) by mouth daily. 12/02/13   Volanda Napoleon, MD  DULoxetine (CYMBALTA) 30 MG capsule One cap by mouth daily for 1 week, then increase to 2 caps by mouth daily on week two 03/02/14   Debbrah Alar, NP  EPIPEN 2-PAK 0.3 MG/0.3ML SOAJ injection as needed.  05/14/13   Historical Provider, MD  FLUoxetine (PROZAC) 10 MG tablet 2 tabs daily for 1 week, then 1 tab daily for 1 week then stop. 03/02/14   Debbrah Alar, NP  gabapentin (NEURONTIN) 100 MG capsule Take 300 mg by mouth at bedtime.     Historical Provider, MD  ibuprofen (ADVIL) 600 MG tablet Take 1 tablet (600 mg total) by mouth every 6 (six) hours as needed for pain. 12/10/12   Claiborne Billings A. Pamala Hurry, MD  levothyroxine (SYNTHROID) 50 MCG tablet Take 1 tablet (50 mcg total) by mouth daily before breakfast. 12/02/13   Volanda Napoleon, MD  losartan (COZAAR) 100 MG tablet Take 1 tablet (100 mg total) by mouth daily. 08/13/13   Sherren Mocha, MD  polyethylene glycol [facility]) packet Take 17 g by mouth daily. You may increase to twice or three times daily until you have loose stools, then decrease back to once daily, you will need to take on a consistent basis 04/26/14   Threasa Beards, MD  spironolactone (ALDACTONE) 25 MG tablet Take 1 tablet (25 mg total) by mouth daily. 11/13/13   Debbrah Alar, NP  SUMAtriptan (IMITREX) 50 MG tablet Take one tab by mouth now, may repeat in 2 hours if symptoms are not resolved. 03/23/14   Debbrah Alar, NP   BP 119/73  Pulse 82  Temp(Src) 97.3 F (36.3 C) (Oral)  Resp 14  SpO2 97% Physical Exam  Nursing note and vitals reviewed. Constitutional: She is oriented to person, place, and time. She appears well-developed and well-nourished. No distress.  HENT:  Head: Normocephalic and atraumatic.  Right Ear: External ear normal.  Left Ear: External ear normal.  Nose: Nose normal.  Mouth/Throat: Oropharynx is clear and  moist.  Eyes: Conjunctivae are normal.  Neck: Normal range of motion.  Cardiovascular: Normal rate, regular rhythm, normal heart sounds, intact distal pulses and normal pulses.   Pulses:      Radial pulses are 2+ on the right side, and 2+ on the left side.       Posterior tibial pulses are 2+ on the right side, and 2+ on the left side.  Pulmonary/Chest: Effort normal and breath sounds normal. No stridor. No respiratory distress. She has no wheezes. She has no rales.  No tenderness to palpation  Abdominal: Soft. Bowel sounds are normal.  She exhibits no distension. There is no tenderness.  Musculoskeletal: Normal range of motion.  Neurological: She is alert and oriented to person, place, and time. She has normal strength.  Skin: Skin is warm and dry. She is not diaphoretic. No erythema.  Psychiatric: She has a normal mood and affect. Her behavior is normal.    ED Course  Procedures (including critical care time) Labs Review Labs Reviewed  CBC WITH DIFFERENTIAL - Abnormal; Notable for the following:    WBC 15.0 (*)    Neutro Abs 11.1 (*)    Monocytes Absolute 1.2 (*)    All other components within normal limits  COMPREHENSIVE METABOLIC PANEL - Abnormal; Notable for the following:    ALT 43 (*)    All other components within normal limits  LIPASE, BLOOD  I-STAT TROPOININ, ED  I-STAT TROPOININ, ED    Imaging Review  Dg Abd Acute W/chest  04/29/2014   CLINICAL DATA:  Chest pain, abdominal pain, constipation  EXAM: ACUTE ABDOMEN SERIES (ABDOMEN 2 VIEW & CHEST 1 VIEW)  COMPARISON:  04/26/2014  FINDINGS: Cardiomediastinal silhouette is stable. No acute infiltrate or pleural effusion. No pulmonary edema. There is nonspecific nonobstructive bowel gas pattern. No free abdominal air. Moderate stool in transverse colon.  IMPRESSION: Negative abdominal radiographs. No acute cardiopulmonary disease. Moderate colonic stool transverse colon.   Electronically Signed   By: Lahoma Crocker M.D.   On:  04/29/2014 13:59      EKG Interpretation None      MDM   Final diagnoses:  Chest pain, unspecified chest pain type  Constipation, unspecified constipation type    Patient is to be discharged with recommendation to follow up with PCP in regards to today's hospital visit. Chest pain is not likely of cardiac or pulmonary etiology d/t presentation, perc negative, VSS, no tracheal deviation, no JVD or new murmur, RRR, breath sounds equal bilaterally, EKG without acute abnormalities, negative troponin x 2, and negative CXR. Pt has been advised to return to the ED is CP becomes exertional, associated with diaphoresis or nausea, radiates to left jaw/arm, worsens or becomes concerning in any way. Pt appears reliable for follow up and is agreeable to discharge.    Case has been discussed with Dr. Alvino Chapel who agrees with the above plan to discharge.     Elwyn Lade, PA-C 05/04/14 [phone]

## 2014-04-29 NOTE — ED Notes (Signed)
43 yo via [facility] with c/o chest pain in left breast radiating around to back. Became SOB, Nauseous and Diaphoretic. Pt reports pain felt like a deep cramp in chest. Pain 7/10. Per EMS hx HTN, Severely Constipation ( Currently being treated by Dr. Collene Mares) Abd distention. Received 4 ASA, 3 nitro and placed on 2L for comfort. Pain LUQ. A/o x4 vitals stable.

## 2014-04-29 NOTE — Discharge Instructions (Signed)
Chest Pain (Nonspecific) °It is often hard to give a specific diagnosis for the cause of chest pain. There is always a chance that your pain could be related to something serious, such as a heart attack or a blood clot in the lungs. You need to follow up with your health care provider for further evaluation. °CAUSES  °· Heartburn. °· Pneumonia or bronchitis. °· Anxiety or stress. °· Inflammation around your heart (pericarditis) or lung (pleuritis or pleurisy). °· A blood clot in the lung. °· A collapsed lung (pneumothorax). It can develop suddenly on its own (spontaneous pneumothorax) or from trauma to the chest. °· Shingles infection (herpes zoster virus). °The chest wall is composed of bones, muscles, and cartilage. Any of these can be the source of the pain. °· The bones can be bruised by injury. °· The muscles or cartilage can be strained by coughing or overwork. °· The cartilage can be affected by inflammation and become sore (costochondritis). °DIAGNOSIS  °Lab tests or other studies may be needed to find the cause of your pain. Your health care provider may have you take a test called an ambulatory electrocardiogram (ECG). An ECG records your heartbeat patterns over a 24-hour period. You may also have other tests, such as: °· Transthoracic echocardiogram (TTE). During echocardiography, sound waves are used to evaluate how blood flows through your heart. °· Transesophageal echocardiogram (TEE). °· Cardiac monitoring. This allows your health care provider to monitor your heart rate and rhythm in real time. °· Holter monitor. This is a portable device that records your heartbeat and can help diagnose heart arrhythmias. It allows your health care provider to track your heart activity for several days, if needed. °· Stress tests by exercise or by giving medicine that makes the heart beat faster. °TREATMENT  °· Treatment depends on what may be causing your chest pain. Treatment may include: °¨ Acid blockers for  heartburn. °¨ Anti-inflammatory medicine. °¨ Pain medicine for inflammatory conditions. °¨ Antibiotics if an infection is present. °· You may be advised to change lifestyle habits. This includes stopping smoking and avoiding alcohol, caffeine, and chocolate. °· You may be advised to keep your head raised (elevated) when sleeping. This reduces the chance of acid going backward from your stomach into your esophagus. °Most of the time, nonspecific chest pain will improve within 2-3 days with rest and mild pain medicine.  °HOME CARE INSTRUCTIONS  °· If antibiotics were prescribed, take them as directed. Finish them even if you start to feel better. °· For the next few days, avoid physical activities that bring on chest pain. Continue physical activities as directed. °· Do not use any tobacco products, including cigarettes, chewing tobacco, or electronic cigarettes. °· Avoid drinking alcohol. °· Only take medicine as directed by your health care provider. °· Follow your health care provider's suggestions for further testing if your chest pain does not go away. °· Keep any follow-up appointments you made. If you do not go to an appointment, you could develop lasting (chronic) problems with pain. If there is any problem keeping an appointment, call to reschedule. °SEEK MEDICAL CARE IF:  °· Your chest pain does not go away, even after treatment. °· You have a rash with blisters on your chest. °· You have a fever. °SEEK IMMEDIATE MEDICAL CARE IF:  °· You have increased chest pain or pain that spreads to your arm, neck, jaw, back, or abdomen. °· You have shortness of breath. °· You have an increasing cough, or you cough   up blood.  You have severe back or abdominal pain.  You feel nauseous or vomit.  You have severe weakness.  You faint.  You have chills. This is an emergency. Do not wait to see if the pain will go away. Get medical help at once. Call your local emergency services (911 in [place] Do not drive  yourself to the hospital. MAKE SURE YOU:   Understand these instructions.  Will watch your condition.  Will get help right away if you are not doing well or get worse. Document Released: 06/07/2005 Document Revised: 09/02/2013 Document Reviewed: 04/02/2008 [facility] Patient Information 2015 [place], [place]. This information is not intended to replace advice given to you by your health care provider. Make sure you discuss any questions you have with your health care provider.  Constipation Constipation is when a person:  Poops (has a bowel movement) less than 3 times a week.  Has a hard time pooping.  Has poop that is dry, hard, or bigger than normal. HOME CARE   Eat foods with a lot of fiber in them. This includes fruits, vegetables, beans, and whole grains such as brown rice.  Avoid fatty foods and foods with a lot of sugar. This includes french fries, hamburgers, cookies, candy, and soda.  If you are not getting enough fiber from food, take products with added fiber in them (supplements).  Drink enough fluid to keep your pee (urine) clear or pale yellow.  Exercise on a regular basis, or as told by your doctor.  Go to the restroom when you feel like you need to poop. Do not hold it.  Only take medicine as told by your doctor. Do not take medicines that help you poop (laxatives) without talking to your doctor first. GET HELP RIGHT AWAY IF:   You have bright red blood in your poop (stool).  Your constipation lasts more than 4 days or gets worse.  You have belly (abdominal) or butt (rectal) pain.  You have thin poop (as thin as a pencil).  You lose weight, and it cannot be explained. MAKE SURE YOU:   Understand these instructions.  Will watch your condition.  Will get help right away if you are not doing well or get worse. Document Released: 02/14/2008 Document Revised: 09/02/2013 Document Reviewed: 06/09/2013 [facility] Patient Information 2015 [place], [place]. This  information is not intended to replace advice given to you by your health care provider. Make sure you discuss any questions you have with your health care provider.

## 2014-05-05 NOTE — ED Provider Notes (Signed)
Medical screening examination/treatment/procedure(s) were performed by non-physician practitioner and as supervising physician I was immediately available for consultation/collaboration.   EKG Interpretation None       Milind Raether R. Meagan Ancona, MD 05/05/14 1326 

## 2014-05-29 ENCOUNTER — Telehealth: Payer: Self-pay | Admitting: *Deleted

## 2014-05-29 NOTE — Telephone Encounter (Signed)
Received call from front office stating pt called to r/s her hospital follow up that is scheduled for Monday, 05/23/14. States original appt is in a 45 min time slot and she cannot find any other 45 min slots for hospital f/u. Advised her to use available 30 min time slots.  PLease advise if pt truly needs 45 min for f/u?

## 2014-05-30 NOTE — Telephone Encounter (Signed)
30 minutes is fine

## 2014-06-01 ENCOUNTER — Ambulatory Visit: Payer: BC Managed Care – PPO | Admitting: Family

## 2014-06-01 NOTE — Telephone Encounter (Signed)
Appt scheduled 10.05.15, 30 minutes/SLS

## 2014-06-15 ENCOUNTER — Encounter: Payer: Self-pay | Admitting: Family

## 2014-06-15 ENCOUNTER — Ambulatory Visit (INDEPENDENT_AMBULATORY_CARE_PROVIDER_SITE_OTHER): Payer: BC Managed Care – PPO | Admitting: Family

## 2014-06-15 VITALS — BP 122/84 | HR 79 | Temp 98.6°F | Resp 18 | Ht 65.0 in | Wt 198.8 lb

## 2014-06-15 DIAGNOSIS — Z23 Encounter for immunization: Secondary | ICD-10-CM

## 2014-06-15 DIAGNOSIS — E785 Hyperlipidemia, unspecified: Secondary | ICD-10-CM

## 2014-06-15 DIAGNOSIS — R7989 Other specified abnormal findings of blood chemistry: Secondary | ICD-10-CM

## 2014-06-15 DIAGNOSIS — R945 Abnormal results of liver function studies: Secondary | ICD-10-CM

## 2014-06-15 DIAGNOSIS — R1013 Epigastric pain: Secondary | ICD-10-CM

## 2014-06-15 DIAGNOSIS — R5382 Chronic fatigue, unspecified: Secondary | ICD-10-CM

## 2014-06-15 LAB — CBC WITH DIFFERENTIAL/PLATELET
Basophils Absolute: 0 10*3/uL (ref 0.0–0.1)
Basophils Relative: 0.3 % (ref 0.0–3.0)
EOS ABS: 0 10*3/uL (ref 0.0–0.7)
Eosinophils Relative: 0.5 % (ref 0.0–5.0)
HCT: 39.7 % (ref 36.0–46.0)
Hemoglobin: 13.6 g/dL (ref 12.0–15.0)
LYMPHS PCT: 31.7 % (ref 12.0–46.0)
Lymphs Abs: 1.9 10*3/uL (ref 0.7–4.0)
MCHC: 34.3 g/dL (ref 30.0–36.0)
MCV: 89.1 fl (ref 78.0–100.0)
MONOS PCT: 6.4 % (ref 3.0–12.0)
Monocytes Absolute: 0.4 10*3/uL (ref 0.1–1.0)
Neutro Abs: 3.7 10*3/uL (ref 1.4–7.7)
Neutrophils Relative %: 61.1 % (ref 43.0–77.0)
PLATELETS: 185 10*3/uL (ref 150.0–400.0)
RBC: 4.46 Mil/uL (ref 3.87–5.11)
RDW: 14.3 % (ref 11.5–15.5)
WBC: 6.1 10*3/uL (ref 4.0–10.5)

## 2014-06-15 LAB — LIPID PANEL
CHOLESTEROL: 180 mg/dL (ref 0–200)
HDL: 34.6 mg/dL — ABNORMAL LOW (ref >39.00)
LDL Cholesterol: 119 mg/dL — ABNORMAL HIGH (ref 0–99)
NonHDL: 145.4
TRIGLYCERIDES: 130 mg/dL (ref 0.0–149.0)
Total CHOL/HDL Ratio: 5
VLDL: 26 mg/dL (ref 0.0–40.0)

## 2014-06-15 LAB — HEPATIC FUNCTION PANEL
ALBUMIN: 4.4 g/dL (ref 3.5–5.2)
ALT: 21 U/L (ref 0–35)
AST: 19 U/L (ref 0–37)
Alkaline Phosphatase: 60 U/L (ref 39–117)
Bilirubin, Direct: 0 mg/dL (ref 0.0–0.3)
TOTAL PROTEIN: 7.8 g/dL (ref 6.0–8.3)
Total Bilirubin: 1 mg/dL (ref 0.2–1.2)

## 2014-06-15 LAB — TSH: TSH: 0.73 u[IU]/mL (ref 0.35–4.50)

## 2014-06-15 LAB — H. PYLORI ANTIBODY, IGG: H Pylori IgG: POSITIVE — AB

## 2014-06-15 MED ORDER — ARMODAFINIL 150 MG PO TABS: 150.0000 mg | ORAL_TABLET | Freq: Every day | ORAL | Status: DC

## 2014-06-15 NOTE — Progress Notes (Signed)
Subjective:    Patient ID: Michele [name], female    DOB: Mar 12, 1971, 43 y.o.   MRN: [id number]  HPI  Michele [name] is a 43 yr old female who presents today for ED follow up. ED record is reviewed. She was evaluated on 04/29/14 for chest pain. Tryponins/CXR were negative.  She reports some chest wall pain overlying her ribs, but her primary concern today is extreme fatigue. Reports that she missed 3 days of work last week. Having chronic stomach pain, arms and legs hurt.   Stays tired all the time, falls asleep at toilet/desk. Spends all day on the couch.  Hasn't prepared a meal for her family (in months), can't do laundry- all due to fatigue.  She reports that she was referred to [facility] end[facility]in[facility] from her provider at [facility].  She placed her on provera.  Was told that her vit D was low and was placed on vit D 2000 iu vitamin D. She has been referred to sleep specialist at [facility] in a few weeks.  Reports + compliance with CPAP. She reports leg swelling.      Wt Readings from Last 3 Encounters:  06/15/14 198 lb 12.8 oz (90.175 kg)  04/26/14 190 lb (86.183 kg)  03/23/14 192 lb 1.3 oz (87.127 kg)     Review of Systems See HPI  Past Medical History  Diagnosis Date  . Depression   . Hypertension   . Anemia     iron deficient, microcytic, hypochromic  . Anxiety   . Migraines   . 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 Fibroids     uterine  . Palpitations     recurrent  . Fatigue   . Tachycardia     unspecified  . Chest pain     atypical  . 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 Posey Pronto at [facility]- S/p FNA March/april of two dominant nodules- benign.  Following for annual thyroid US with Dr. Posey Pronto.    . Diabetes mellitus without complication   . Fatty liver 08/15/2013    History   Social History  . Marital Status: Married    Spouse Name: [name]    Number of Children: 2  . Years of Education: N/A   Occupational History   . 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 Smoking status: Never Smoker   . Smokeless tobacco: Never Used     Comment: never used tobacco  . Alcohol Use: Yes     Comment: Once a month  . Drug Use: No  . Sexual Activity: Yes    Birth Control/ Protection: None, Surgical   Other Topics Concern  . 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 Appendectomy  03/2009  . Tubal ligation  1997  . Tuboplasty / tubotubal anastomosis  2005  . Biopsy thyroid  November 28, 2009    Dr. Posey Pronto- endo  . Dilation and curettage of uterus  05/21/2011    Procedure: DILATATION AND CURETTAGE (D&C);  Surgeon: Elveria Royals;  Location: [facility];  Service: Gynecology;  Laterality: N/A;  dilitation and currettage/endometrial currettings    Family History  Problem Relation  Age of Onset  . Hypertension Mother   . Cancer Mother 60    breast  . Cancer Father     colon  . Stroke Brother     handicapped due to complications from spinal meningitis  . Seizures Brother   . Asthma Son   . Arthritis Maternal Grandmother   . Hypertension Maternal Grandmother   . Stroke Maternal Grandfather     Allergies  Allergen Reactions  . Amlodipine Shortness Of Breath, Anxiety, Palpitations and Other (See Comments)  . Bee Venom Anaphylaxis  . Bupropion Shortness Of Breath, Anxiety, Palpitations and Other (See Comments)    Current Outpatient Prescriptions on File Prior to Visit  Medication Sig Dispense Refill  . ALPRAZolam (XANAX) 1 MG tablet Take 1 mg by mouth as needed. For anxiety      . aspirin 81 MG tablet Take 81 mg by mouth at bedtime.       . Biotin 2.5 MG TABS Take 2,500 mcg by mouth daily.      . cyanocobalamin (,VITAMIN B-12,) 1000 MCG/ML injection Inject 1 mL into the muscle every 30 (thirty)  days.      Marland Kitchen EPIPEN 2-PAK 0.3 MG/0.3ML SOAJ injection as needed.       Marland Kitchen FLUoxetine (PROZAC) 40 MG capsule Take 40 mg by mouth every morning.      . gabapentin (NEURONTIN) 100 MG capsule Take 300 mg by mouth at bedtime.       Marland Kitchen losartan (COZAAR) 100 MG tablet Take 100 mg by mouth at bedtime.      . polyethylene glycol (MIRALAX / GLYCOLAX) packet Take 17 g by mouth daily as needed for moderate constipation.      Marland Kitchen spironolactone (ALDACTONE) 25 MG tablet Take 25 mg by mouth at bedtime.      . [DISCONTINUED] buPROPion (WELLBUTRIN XL) 150 MG 24 hr tablet Take 2 tablets (300 mg total) by mouth daily.  60 tablet  1   No current facility-administered medications on file prior to visit.    BP 122/84  Pulse 79  Temp(Src) 98.6 F (37 C) (Oral)  Resp 18  Ht 5\' 5"  (1.651 m)  Wt 198 lb 12.8 oz (90.175 kg)  BMI 33.08 kg/m2  SpO2 100%       Objective:   Physical Exam  Constitutional: She is oriented to person, place, and time.  Patient found asleep sitting up on exam table.  Arousable but remains very sleepy.  HENT:  Head: Normocephalic and atraumatic.  Cardiovascular: Normal rate and regular rhythm.   No murmur heard. Pulmonary/Chest: Effort normal and breath sounds normal. No respiratory distress. She has no wheezes. She has no rales. She exhibits no tenderness.  Neurological: She is alert and oriented to person, place, and time.  Skin: Skin is warm and dry.  Psychiatric: Her behavior is normal. Judgment and thought content normal.  Tearful, frustrated          Assessment & Plan:

## 2014-06-15 NOTE — Assessment & Plan Note (Signed)
Severe.  I really think it is important for her to follow through with the sleep specialist as it is possible with her weight gain that her CPAP settings are inadequate.  She as had a very extensive work up in the past for autoimmune etiology which has been negative. Of note, she had a leukocytosis in the ED. Will repeat CBC today along with TSH, H. Pylori, RMSF, Lyme titers.  Trial of nuvigil.

## 2014-06-15 NOTE — Patient Instructions (Signed)
Complete lab work prior to leaving.  Start Nuvigil. Keep your upcoming appointment with the sleep specialist. Follow up in 1 week for BP recheck.

## 2014-06-15 NOTE — Progress Notes (Signed)
Pre visit review using our clinic review tool, if applicable. No additional management support is needed unless otherwise documented below in the visit note. 

## 2014-06-16 ENCOUNTER — Encounter: Payer: Self-pay | Admitting: Family

## 2014-06-16 ENCOUNTER — Other Ambulatory Visit: Payer: Self-pay | Admitting: Family

## 2014-06-16 DIAGNOSIS — A048 Other specified bacterial intestinal infections: Secondary | ICD-10-CM | POA: Insufficient documentation

## 2014-06-16 LAB — ROCKY MTN SPOTTED FVR ABS PNL(IGG+IGM)
RMSF IGM: 0.08 IV
RMSF IgG: 0.1 IV

## 2014-06-16 LAB — B. BURGDORFI ANTIBODIES: B BURGDORFERI AB IGG+ IGM: 0.68 {ISR}

## 2014-06-16 NOTE — Telephone Encounter (Signed)
H. Pylori (stomach bacteria test) is positive.  This could be contributing to her abdominal pain.  I would like her to take meds as pended below to help treat the H.Pylori.  Other tests- [place], lyme disease negative. White blood cell count has returned to normal, LFT and thyroid normal.  Cholesterol stable.

## 2014-06-17 ENCOUNTER — Telehealth: Payer: Self-pay | Admitting: *Deleted

## 2014-06-17 ENCOUNTER — Telehealth: Payer: Self-pay | Admitting: Family

## 2014-06-17 MED ORDER — CLARITHROMYCIN 500 MG PO TABS: 500.0000 mg | ORAL_TABLET | Freq: Two times a day (BID) | ORAL | Status: DC

## 2014-06-17 MED ORDER — AMOXICILLIN 500 MG PO CAPS: 1000.0000 mg | ORAL_CAPSULE | Freq: Two times a day (BID) | ORAL | Status: DC

## 2014-06-17 MED ORDER — OMEPRAZOLE 20 MG PO CPDR: 20.0000 mg | DELAYED_RELEASE_CAPSULE | Freq: Two times a day (BID) | ORAL | Status: DC

## 2014-06-17 NOTE — Telephone Encounter (Signed)
Spoke with pt, she states that she had to leave work due to her stomach pain today and wanted to know if there was something she could take for it. Advised her that symptoms should improve / resolve once she starts treatment for the H. Pylori. Also wanted to let us know that she has contacted [employer] for get a new mask/strap/filter and tubing for her CPAP machine and they are going to send order to [name]. Advised pt that if insurance will require [name] to send documentation re: face to face encounter she will need to go through pulmonology to get her supplies. Pt voices understanding. Please advise if you have any further recommendations?

## 2014-06-17 NOTE — Telephone Encounter (Signed)
PA for Nuvigil completed and approved effective 06/16/2014 through 09/10/2038. Reference number: [id number]. Approval letter sent for scanning. JG//CMA

## 2014-06-17 NOTE — Telephone Encounter (Signed)
I can fill face to face if needed.  I would recommend that she arrange a follow up with Dr. Mickel Duhamel for further evaluation of her abdominal pain.

## 2014-06-17 NOTE — Telephone Encounter (Signed)
Left message for pt to return my call on Friday.

## 2014-06-17 NOTE — Telephone Encounter (Signed)
Patient informed, understood & agreed; Rx[s] sent to pharmacy/[facility]

## 2014-06-17 NOTE — Telephone Encounter (Signed)
Caller name: Marcie Bal  Relation to pt: self  Call back number: [phone]   Reason for call: pt would like to discuss stomach infection and c-pap machine

## 2014-06-19 ENCOUNTER — Telehealth: Payer: Self-pay | Admitting: Family

## 2014-06-19 NOTE — Telephone Encounter (Signed)
Notified pt and she states she has appt with Dr Collene Mares next week as her stomach is now distended and she continues to have pain.

## 2014-06-19 NOTE — Telephone Encounter (Signed)
See previous phone note.  

## 2014-06-19 NOTE — Telephone Encounter (Signed)
Patient returning your call.

## 2014-06-22 ENCOUNTER — Telehealth: Payer: Self-pay | Admitting: Pulmonary Disease

## 2014-06-22 ENCOUNTER — Telehealth: Payer: Self-pay | Admitting: *Deleted

## 2014-06-22 DIAGNOSIS — G4733 Obstructive sleep apnea (adult) (pediatric): Secondary | ICD-10-CM

## 2014-06-22 NOTE — Telephone Encounter (Signed)
Received CMN / order from [employer] for pt's CPAP. Form will need to be faxed to [phone]. Order forwarded to [employer] for completion.

## 2014-06-22 NOTE — Telephone Encounter (Signed)
Called pt. Aware order placed and to keep pending appt with RA in [place]. Nothing further needed

## 2014-06-24 NOTE — Telephone Encounter (Signed)
Paperwork is completed.

## 2014-06-24 NOTE — Telephone Encounter (Signed)
Michele [name]-- Was this paperwork forwarded to you?

## 2014-06-25 NOTE — Telephone Encounter (Signed)
I received completed forms on 06/24/2014 and faxed them at 8 am on 06/25/2014. JG//CMA

## 2014-07-09 ENCOUNTER — Ambulatory Visit (HOSPITAL_COMMUNITY)
Admission: RE | Admit: 2014-07-09 | Discharge: 2014-07-09 | Disposition: A | Discharge status: Home / Self Care | Payer: BC Managed Care – PPO | Source: Ambulatory Visit | Attending: Diagnostic Radiology | Admitting: Diagnostic Radiology

## 2014-07-09 DIAGNOSIS — R1011 Right upper quadrant pain: Secondary | ICD-10-CM

## 2014-07-09 MED ORDER — TECHNETIUM TC 99M MEBROFENIN IV KIT
5.0000 | PACK | Freq: Once | INTRAVENOUS | Status: AC | PRN
  Administered 2014-07-09: 5 via INTRAVENOUS

## 2014-07-09 MED ORDER — STERILE WATER FOR INJECTION IJ SOLN
INTRAMUSCULAR | Status: AC
  Filled 2014-07-09: qty 10

## 2014-07-09 MED ORDER — SINCALIDE 5 MCG IJ SOLR
INTRAMUSCULAR | Status: AC
  Administered 2014-07-09: 1.77 ug
  Filled 2014-07-09: qty 5

## 2014-07-13 ENCOUNTER — Encounter: Payer: Self-pay | Admitting: Family

## 2014-07-16 ENCOUNTER — Ambulatory Visit: Payer: BC Managed Care – PPO | Admitting: Pulmonary Disease

## 2014-07-19 ENCOUNTER — Other Ambulatory Visit: Payer: Self-pay | Admitting: Family

## 2014-07-23 ENCOUNTER — Ambulatory Visit (INDEPENDENT_AMBULATORY_CARE_PROVIDER_SITE_OTHER): Payer: BC Managed Care – PPO | Admitting: Family

## 2014-07-23 ENCOUNTER — Encounter: Payer: Self-pay | Admitting: Family

## 2014-07-23 VITALS — HR 81 | Temp 98.2°F | Resp 18 | Wt 201.8 lb

## 2014-07-23 DIAGNOSIS — R609 Edema, unspecified: Secondary | ICD-10-CM

## 2014-07-23 DIAGNOSIS — F329 Major depressive disorder, single episode, unspecified: Secondary | ICD-10-CM

## 2014-07-23 DIAGNOSIS — R5382 Chronic fatigue, unspecified: Secondary | ICD-10-CM

## 2014-07-23 DIAGNOSIS — F418 Other specified anxiety disorders: Secondary | ICD-10-CM

## 2014-07-23 DIAGNOSIS — F32A Depression, unspecified: Secondary | ICD-10-CM

## 2014-07-23 DIAGNOSIS — F419 Anxiety disorder, unspecified: Secondary | ICD-10-CM

## 2014-07-23 NOTE — Patient Instructions (Signed)
Start nuvigil. Follow up in 2-3 weeks after starting nuvigil. Keep appointment with Dr. Toy Care.

## 2014-07-23 NOTE — Progress Notes (Signed)
Pre visit review using our clinic review tool, if applicable. No additional management support is needed unless otherwise documented below in the visit note. 

## 2014-07-23 NOTE — Assessment & Plan Note (Signed)
I think that this is likely due to venous stasis, advised compression stockings, elevation, limit sodium. She did have a normal 2D echo in 2014.

## 2014-07-23 NOTE — Assessment & Plan Note (Signed)
Pt instructed to keep upcoming appointment with psychiatry today.

## 2014-07-23 NOTE — Progress Notes (Signed)
Subjective:    Patient ID: Michele [name], female    DOB: 1971/07/10, 43 y.o.   MRN: [id number]  HPI   [name] is a 43 yr old female who presents today for follow up.  LE edema-pt reports bilateral LE edema which is intermittent  Continues to have fatigue- has not started with nuvigil. Worried about possible side effects. Reports ongoing compliance with CPAP.   neuropsych exam- Dr. Trula Ore referred her to neuropsychologist.  Was told that she felt pt is severely depressed.  She was told to follow back up with Dr. Toy Care. She sees her today. Reports that she lays on the couch most of the time when she is not at work. She is not an active participant in her family. She is concerned about her progressive weight gain.  Wt Readings from Last 3 Encounters:  07/23/14 201 lb 12.8 oz (91.536 kg)  06/15/14 198 lb 12.8 oz (90.175 kg)  04/26/14 190 lb (86.183 kg)   Body mass index is 33.58 kg/(m^2).   Review of Systems    see HPI  Past Medical History  Diagnosis Date  . Depression   . Hypertension   . Anemia     iron deficient, microcytic, hypochromic  . Anxiety   . Migraines   . 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 Fibroids     uterine  . Palpitations     recurrent  . Fatigue   . Tachycardia     unspecified  . Chest pain     atypical  . 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 Posey Pronto at [facility]- S/p FNA March/april of two dominant nodules- benign.  Following for annual thyroid US with Dr. Posey Pronto.    . Diabetes mellitus without complication   . Fatty liver 08/15/2013    History   Social History  . Marital Status: Married    Spouse Name: [name]    Number of Children: 2  . Years of Education: N/A   Occupational History  . 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 Smoking status: Never Smoker   . Smokeless tobacco: Never Used     Comment: never used tobacco  . Alcohol Use: Yes     Comment: Once a month  .  Drug Use: No  . Sexual Activity: Yes    Birth Control/ Protection: None, Surgical   Other Topics Concern  . 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 Appendectomy  03/2009  . Tubal ligation  1997  . Tuboplasty / tubotubal anastomosis  2005  . Biopsy thyroid  November 28, 2009    Dr. Posey Pronto- endo  . Dilation and curettage of uterus  05/21/2011    Procedure: DILATATION AND CURETTAGE (D&C);  Surgeon: Elveria Royals;  Location: [facility];  Service: Gynecology;  Laterality: N/A;  dilitation and currettage/endometrial currettings    Family History  Problem Relation Age of Onset  . Hypertension Mother   . Cancer Mother 43    breast  . Cancer Father     colon  . Stroke Brother     handicapped due to complications from spinal meningitis  . Seizures  Brother   . Asthma Son   . Arthritis Maternal Grandmother   . Hypertension Maternal Grandmother   . Stroke Maternal Grandfather     Allergies  Allergen Reactions  . Amlodipine Shortness Of Breath, Anxiety, Palpitations and Other (See Comments)  . Bee Venom Anaphylaxis  . Bupropion Shortness Of Breath, Anxiety, Palpitations and Other (See Comments)    Current Outpatient Prescriptions on File Prior to Visit  Medication Sig Dispense Refill  . ALPRAZolam (XANAX) 1 MG tablet Take 1 mg by mouth as needed. For anxiety    . amoxicillin (AMOXIL) 500 MG capsule Take 2 capsules (1,000 mg total) by mouth 2 (two) times daily. 40 capsule 0  . Armodafinil (NUVIGIL) 150 MG tablet Take 1 tablet (150 mg total) by mouth daily. 30 tablet 0  . aspirin 81 MG tablet Take 81 mg by mouth at bedtime.     . Biotin 2.5 MG TABS Take 2,500 mcg by mouth daily.    . butalbital-acetaminophen-caffeine (FIORICET, ESGIC) 50-325-40 MG per tablet Take 1 tablet by  mouth as needed. migraine    . Cholecalciferol (VITAMIN D) 2000 UNITS CAPS Take 1 capsule by mouth daily.    . clarithromycin (BIAXIN) 500 MG tablet Take 1 tablet (500 mg total) by mouth 2 (two) times daily. 20 tablet 0  . cyanocobalamin (,VITAMIN B-12,) 1000 MCG/ML injection Inject 1 mL into the muscle every 30 (thirty) days.    Marland Kitchen EPIPEN 2-PAK 0.3 MG/0.3ML SOAJ injection as needed.     Marland Kitchen FLUoxetine (PROZAC) 40 MG capsule Take 40 mg by mouth every morning.    . gabapentin (NEURONTIN) 100 MG capsule Take 300 mg by mouth at bedtime.     Marland Kitchen losartan (COZAAR) 100 MG tablet Take 100 mg by mouth at bedtime.    Marland Kitchen omeprazole (PRILOSEC) 20 MG capsule Take 1 capsule (20 mg total) by mouth 2 (two) times daily before a meal. 20 capsule 0  . polyethylene glycol (MIRALAX / GLYCOLAX) packet Take 17 g by mouth daily as needed for moderate constipation.    Marland Kitchen spironolactone (ALDACTONE) 25 MG tablet TAKE ONE TABLET BY MOUTH ONCE DAILY 30 tablet 2  . [DISCONTINUED] buPROPion (WELLBUTRIN XL) 150 MG 24 hr tablet Take 2 tablets (300 mg total) by mouth daily. 60 tablet 1   No current facility-administered medications on file prior to visit.    Pulse 81  Temp(Src) 98.2 F (36.8 C) (Oral)  Resp 18  Wt 201 lb 12.8 oz (91.536 kg)  SpO2 98%    Objective:   Physical Exam  Constitutional: She is oriented to person, place, and time. She appears well-developed and well-nourished.  Tired appearing AA female, NAD  HENT:  Head: Normocephalic and atraumatic.  Cardiovascular: Normal rate and regular rhythm.   No murmur heard. Pulmonary/Chest: Effort normal and breath sounds normal. No respiratory distress. She has no wheezes. She has no rales. She exhibits no tenderness.  Musculoskeletal:  1+ bilateral LE edema.   Lymphadenopathy:    She has no cervical adenopathy.  Neurological: She is alert and oriented to person, place, and time.  Psychiatric: She has a normal mood and affect. Her behavior is normal. Thought  content normal.          Assessment & Plan:  25 min spent with pt today. >50% of this time was spent counseling pt on anxiety, depression, fatigue and edema.

## 2014-07-23 NOTE — Assessment & Plan Note (Addendum)
I recommended trial of nuvigil to see if this helps with her symptoms. We will need to monitor her BP closely.  I think that her weight gain is due to inactivity.  Hopefully, if we can get her active again she can focus on healthier lifestyle. Depression certainly could be a factor as well and we discussed importance of following up with psychiatry as scheduled.

## 2014-08-12 ENCOUNTER — Other Ambulatory Visit: Payer: Self-pay | Admitting: Family

## 2014-08-12 NOTE — Telephone Encounter (Signed)
Spoke with pharmacist. Rx was last filled on 06/22/14.  Office note on 07/23/14 advised pt to follow up with [name] 2-3 weeks after starting medication. Rx printed and forwarded to Provider for signature. Pt will need follow up before further refills are given.

## 2014-08-14 ENCOUNTER — Encounter: Payer: Self-pay | Admitting: Physician Assistant

## 2014-08-14 ENCOUNTER — Ambulatory Visit (INDEPENDENT_AMBULATORY_CARE_PROVIDER_SITE_OTHER): Payer: BC Managed Care – PPO | Admitting: Physician Assistant

## 2014-08-14 ENCOUNTER — Ambulatory Visit: Payer: BC Managed Care – PPO | Admitting: Family

## 2014-08-14 VITALS — BP 126/90 | HR 80 | Temp 98.1°F | Resp 16

## 2014-08-14 DIAGNOSIS — J00 Acute nasopharyngitis [common cold]: Secondary | ICD-10-CM

## 2014-08-14 DIAGNOSIS — B9689 Other specified bacterial agents as the cause of diseases classified elsewhere: Secondary | ICD-10-CM

## 2014-08-14 DIAGNOSIS — J208 Acute bronchitis due to other specified organisms: Principal | ICD-10-CM

## 2014-08-14 MED ORDER — HYDROCOD POLST-CHLORPHEN POLST 10-8 MG/5ML PO LQCR: 5.0000 mL | Freq: Two times a day (BID) | ORAL | Status: DC | PRN

## 2014-08-14 MED ORDER — DOXYCYCLINE HYCLATE 100 MG PO CAPS: 100.0000 mg | ORAL_CAPSULE | Freq: Two times a day (BID) | ORAL | Status: DC

## 2014-08-14 NOTE — Patient Instructions (Signed)
Please take antibiotic as directed.  Increase fluids.  Rest.  Place a humidifier in the bedroom.  Continue your Mucinex.  Use Tussionex for nighttime cough.  Get plenty of rest.  Call or return to clinic if symptoms are not improving.  Acute Bronchitis Bronchitis is inflammation of the airways that extend from the windpipe into the lungs (bronchi). The inflammation often causes mucus to develop. This leads to a cough, which is the most common symptom of bronchitis.  In acute bronchitis, the condition usually develops suddenly and goes away over time, usually in a couple weeks. Smoking, allergies, and asthma can make bronchitis worse. Repeated episodes of bronchitis may cause further lung problems.  CAUSES Acute bronchitis is most often caused by the same virus that causes a cold. The virus can spread from person to person (contagious) through coughing, sneezing, and touching contaminated objects. SIGNS AND SYMPTOMS   Cough.   Fever.   Coughing up mucus.   Body aches.   Chest congestion.   Chills.   Shortness of breath.   Sore throat.  DIAGNOSIS  Acute bronchitis is usually diagnosed through a physical exam. Your health care provider will also ask you questions about your medical history. Tests, such as chest X-rays, are sometimes done to rule out other conditions.  TREATMENT  Acute bronchitis usually goes away in a couple weeks. Oftentimes, no medical treatment is necessary. Medicines are sometimes given for relief of fever or cough. Antibiotic medicines are usually not needed but may be prescribed in certain situations. In some cases, an inhaler may be recommended to help reduce shortness of breath and control the cough. A cool mist vaporizer may also be used to help thin bronchial secretions and make it easier to clear the chest.  HOME CARE INSTRUCTIONS  Get plenty of rest.   Drink enough fluids to keep your urine clear or pale yellow (unless you have a medical condition  that requires fluid restriction). Increasing fluids may help thin your respiratory secretions (sputum) and reduce chest congestion, and it will prevent dehydration.   Take medicines only as directed by your health care provider.  If you were prescribed an antibiotic medicine, finish it all even if you start to feel better.  Avoid smoking and secondhand smoke. Exposure to cigarette smoke or irritating chemicals will make bronchitis worse. If you are a smoker, consider using nicotine gum or skin patches to help control withdrawal symptoms. Quitting smoking will help your lungs heal faster.   Reduce the chances of another bout of acute bronchitis by washing your hands frequently, avoiding people with cold symptoms, and trying not to touch your hands to your mouth, nose, or eyes.   Keep all follow-up visits as directed by your health care provider.  SEEK MEDICAL CARE IF: Your symptoms do not improve after 1 week of treatment.  SEEK IMMEDIATE MEDICAL CARE IF:  You develop an increased fever or chills.   You have chest pain.   You have severe shortness of breath.  You have bloody sputum.   You develop dehydration.  You faint or repeatedly feel like you are going to pass out.  You develop repeated vomiting.  You develop a severe headache. MAKE SURE YOU:   Understand these instructions.  Will watch your condition.  Will get help right away if you are not doing well or get worse. Document Released: 10/05/2004 Document Revised: 01/12/2014 Document Reviewed: 02/18/2013 [facility] Patient Information 2015 [place], [place]. This information is not intended to replace advice given  to you by your health care provider. Make sure you discuss any questions you have with your health care provider.  

## 2014-08-14 NOTE — Progress Notes (Signed)
Pre visit review using our clinic review tool, if applicable. No additional management support is needed unless otherwise documented below in the visit note. 

## 2014-08-14 NOTE — Assessment & Plan Note (Signed)
Rx Doxycycline.  Increase fluids.  Rest.  Tussionex for nighttime cough.  Continue Mucinex. Continue other medications as directed.  Humidifier in bedroom.

## 2014-08-14 NOTE — Telephone Encounter (Signed)
Rx faxed to pharmacy. Please call pt to arrange appt with PCP if not already scheduled.

## 2014-08-14 NOTE — Progress Notes (Signed)
Patient presents to clinic today c/o chest congestion, cough, chest tightness, thick sputum that is green, intermittent fevers and fatigue.  Denies pleuritic chest pain or shortness of breath.  Denies recent travel or sick contact. Patient is not a smoker.  Denies history of asthma or COPD.  Past Medical History  Diagnosis Date  . Depression   . Hypertension   . Anemia     iron deficient, microcytic, hypochromic  . Anxiety   . Migraines   . 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 Fibroids     uterine  . Palpitations     recurrent  . Fatigue   . Tachycardia     unspecified  . Chest pain     atypical  . 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 Posey Pronto at [facility]- S/p FNA March/april of two dominant nodules- benign.  Following for annual thyroid US with Dr. Posey Pronto.    . Diabetes mellitus without complication   . Fatty liver 08/15/2013    Current Outpatient Prescriptions on File Prior to Visit  Medication Sig Dispense Refill  . ALPRAZolam (XANAX) 1 MG tablet Take 1 mg by mouth as needed. For anxiety    . aspirin 81 MG tablet Take 81 mg by mouth at bedtime.     . Biotin 2.5 MG TABS Take 2,500 mcg by mouth daily.    . butalbital-acetaminophen-caffeine (FIORICET, ESGIC) 50-325-40 MG per tablet Take 1 tablet by mouth as needed. migraine    . Cholecalciferol (VITAMIN D) 2000 UNITS CAPS Take 1 capsule by mouth daily.    . cyanocobalamin (,VITAMIN B-12,) 1000 MCG/ML injection Inject 1 mL into the muscle every 30 (thirty) days.    Marland Kitchen EPIPEN 2-PAK 0.3 MG/0.3ML SOAJ injection as needed.     Marland Kitchen FLUoxetine (PROZAC) 40 MG capsule Take 40 mg by mouth every morning.    . gabapentin (NEURONTIN) 100 MG capsule Take 300 mg by mouth at bedtime.     Marland Kitchen losartan (COZAAR) 100 MG tablet Take 100 mg by mouth at bedtime.    Marland Kitchen NUVIGIL 150 MG tablet TAKE ONE TABLET BY MOUTH ONCE DAILY 30 tablet 0  . omeprazole (PRILOSEC) 20 MG capsule Take 1 capsule (20 mg total) by mouth 2 (two) times  daily before a meal. 20 capsule 0  . polyethylene glycol (MIRALAX / GLYCOLAX) packet Take 17 g by mouth daily as needed for moderate constipation.    Marland Kitchen spironolactone (ALDACTONE) 25 MG tablet TAKE ONE TABLET BY MOUTH ONCE DAILY 30 tablet 2  . amoxicillin (AMOXIL) 500 MG capsule Take 2 capsules (1,000 mg total) by mouth 2 (two) times daily. (Patient not taking: Reported on 08/14/2014) 40 capsule 0  . clarithromycin (BIAXIN) 500 MG tablet Take 1 tablet (500 mg total) by mouth 2 (two) times daily. (Patient not taking: Reported on 08/14/2014) 20 tablet 0  . [DISCONTINUED] buPROPion (WELLBUTRIN XL) 150 MG 24 hr tablet Take 2 tablets (300 mg total) by mouth daily. 60 tablet 1   No current facility-administered medications on file prior to visit.    Allergies  Allergen Reactions  . Amlodipine Shortness Of Breath, Anxiety, Palpitations and Other (See Comments)  . Bee Venom Anaphylaxis  . Bupropion Shortness Of Breath, Anxiety, Palpitations and Other (See Comments)    Family History  Problem Relation Age of Onset  . Hypertension Mother   . Cancer Mother 15    breast  . Cancer Father     colon  .  Stroke Brother     handicapped due to complications from spinal meningitis  . Seizures Brother   . Asthma Son   . Arthritis Maternal Grandmother   . Hypertension Maternal Grandmother   . Stroke Maternal Grandfather     History   Social History  . Marital Status: Married    Spouse Name: [name]    Number of Children: 2  . Years of Education: N/A   Occupational History  . 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 Smoking status: Never Smoker   . Smokeless tobacco: Never Used     Comment: never used tobacco  . Alcohol Use: Yes     Comment: Once a month  . Drug Use: No  . Sexual Activity: Yes    Birth Control/ Protection: None, Surgical   Other Topics Concern  . 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  All other ROS are negative.  There were no vitals taken for this visit.  Physical Exam  Constitutional: She is oriented to person, place, and time and well-developed, well-nourished, and in no distress.  HENT:  Head: Normocephalic and atraumatic.  Right Ear: External ear normal.  Left Ear: External ear normal.  Nose: Nose normal.  Mouth/Throat: Oropharynx is clear and moist. No oropharyngeal exudate.  TM within normal limits bilaterally.  Eyes: Conjunctivae are normal. Pupils are equal, round, and reactive to light.  Neck: Neck supple.  Cardiovascular: Normal rate, normal heart sounds and intact distal pulses.   Pulmonary/Chest: Effort normal and breath sounds normal. No respiratory distress. She has no wheezes. She has no rales. She exhibits no tenderness.  Lymphadenopathy:    She has no cervical adenopathy.  Neurological: She is alert and oriented to person, place, and time.  Skin: Skin is warm and dry. No rash noted.  Psychiatric: Affect normal.  Nursing note and vitals reviewed.   Recent Results (from the past 2160 hour(s))  Hepatic function panel     Status: None   Collection Time: 06/15/14 11:13 AM  Result Value Ref Range   Total Bilirubin 1.0 0.2 - 1.2 mg/dL   Bilirubin, Direct 0.0 0.0 - 0.3 mg/dL   Alkaline Phosphatase 60 39 - 117 U/L   AST 19 0 - 37 U/L   ALT 21 0 - 35 U/L   Total Protein 7.8 6.0 - 8.3 g/dL   Albumin 4.4 3.5 - 5.2 g/dL  Rocky mtn spotted fvr abs pnl(IgG+IgM)     Status: None   Collection Time: 06/15/14 11:13 AM  Result Value Ref Range   RMSF IgG 0.10 IV   RMSF IgM 0.08 IV    Comment:   IV = Index Value   REFERENCE INTERVAL [facility] Spotted Fever Antibody, IgG, IgM  IgG (IV)    IgM (IV)   Result           Interpretation: ---------   ---------  ---------         --------------- <0.80       <0.90      Negative     No significant level of Rickettsia  ricketsii IgG and/or IgM antibody                                    detected. If clinically indicated,                                    repeat sample within 7-14 days. 0.80-1.20   0.90-1.10  Equivocal   Questionable presence of Rickettsia                                    rickettsii IgG and/or IgM antibody                                    detected. Recommend recollecting                                    and retesting, if clinically                                    indicated. >1.20       >1.10      Positive    Presence of IgG and/or IgM antibody                                    to Rickettsia rickettsii detected.                                    IgG indicates evidence of prior                                    infection and may not indicate                                    active disease.  IgM suggestive                                    of current or recent infection. ThE RMSF IgM test was validated and its performance characteristics determined by [employer].  It has not been cleared or approved by the U.S. Food and Drug Administration.  The FDA has determined that such clearance or approval is not necessary.  This test is used for clinical purposes.  It should not be regarded as investigational or for research.  B. Burgdorfi Antibodies     Status: None   Collection Time: 06/15/14 11:13 AM  Result Value Ref Range   B burgdorferi Ab IgG+IgM 0.68 ISR    Comment: Antibody to Borrelia burgdorferi not detected.      ISR = Immune Status Ratio              <0.90  ISR       Negative              0.90 - 1.09   ISR       Equivocal              >=1.10        ISR       Positive  CBC with Differential     Status: None   Collection Time: 06/15/14 11:13 AM  Result Value Ref Range   WBC 6.1 4.0 - 10.5 K/uL   RBC 4.46 3.87 - 5.11 Mil/uL   Hemoglobin 13.6 12.0 - 15.0 g/dL   HCT 39.7 36.0 - 46.0  %   MCV 89.1 78.0 - 100.0 fl   MCHC 34.3 30.0 - 36.0 g/dL   RDW 14.3 11.5 - 15.5 %   Platelets 185.0 150.0 - 400.0 K/uL   Neutrophils Relative % 61.1 43.0 - 77.0 %   Lymphocytes Relative 31.7 12.0 - 46.0 %   Monocytes Relative 6.4 3.0 - 12.0 %   Eosinophils Relative 0.5 0.0 - 5.0 %   Basophils Relative 0.3 0.0 - 3.0 %   Neutro Abs 3.7 1.4 - 7.7 K/uL   Lymphs Abs 1.9 0.7 - 4.0 K/uL   Monocytes Absolute 0.4 0.1 - 1.0 K/uL   Eosinophils Absolute 0.0 0.0 - 0.7 K/uL   Basophils Absolute 0.0 0.0 - 0.1 K/uL  TSH     Status: None   Collection Time: 06/15/14 11:13 AM  Result Value Ref Range   TSH 0.73 0.35 - 4.50 uIU/mL  H. pylori antibody, IgG     Status: Abnormal   Collection Time: 06/15/14 11:13 AM  Result Value Ref Range   H Pylori IgG Positive (A) Negative  Lipid panel     Status: Abnormal   Collection Time: 06/15/14 11:13 AM  Result Value Ref Range   Cholesterol 180 0 - 200 mg/dL    Comment: ATP III Classification       Desirable:  < 200 mg/dL               Borderline High:  200 - 239 mg/dL          High:  > = 240 mg/dL   Triglycerides 130.0 0.0 - 149.0 mg/dL    Comment: Normal:  <150 mg/dLBorderline High:  150 - 199 mg/dL   HDL 34.60 (L) >39.00 mg/dL   VLDL 26.0 0.0 - 40.0 mg/dL   LDL Cholesterol 119 (H) 0 - 99 mg/dL   Total CHOL/HDL Ratio 5     Comment:                Men          Women1/2 Average Risk     3.4          3.3Average Risk          5.0          4.42X Average Risk          9.6          7.13X Average Risk          15.0          11.0                       NonHDL 145.40     Comment: NOTE:  Non-HDL goal should be 30 mg/dL higher than patient's LDL goal (i.e. LDL goal of < 70 mg/dL, would have  non-HDL goal of < 100 mg/dL)    Assessment/Plan: Acute bacterial bronchitis Rx Doxycycline.  Increase fluids.  Rest.  Tussionex for nighttime cough.  Continue Mucinex. Continue other medications as directed.  Humidifier in bedroom.

## 2014-08-17 NOTE — Telephone Encounter (Signed)
Informed patient of this and she states that she will call back to schedule °

## 2014-08-18 ENCOUNTER — Telehealth: Payer: Self-pay | Admitting: Family

## 2014-08-18 MED ORDER — SULFAMETHOXAZOLE-TRIMETHOPRIM 800-160 MG PO TABS: 1.0000 | ORAL_TABLET | Freq: Two times a day (BID) | ORAL | Status: DC

## 2014-08-18 NOTE — Telephone Encounter (Signed)
Caller name: monica Relation to pt: self Call back number: [phone] Pharmacy: [facility] [address]  Reason for call:   Patient states that she is not feeling any better at all since last visit. Cough syrup is not working and she is still taking antibiotics

## 2014-08-18 NOTE — Telephone Encounter (Signed)
Stop Doxycycline.  Rx Bactrim sent to pharmacy.  Take as directed.  Follow-up if no improvement.

## 2014-08-18 NOTE — Telephone Encounter (Signed)
Informed patient of this.  °

## 2014-08-20 ENCOUNTER — Ambulatory Visit: Payer: BC Managed Care – PPO | Admitting: Pulmonary Disease

## 2014-08-26 ENCOUNTER — Ambulatory Visit (INDEPENDENT_AMBULATORY_CARE_PROVIDER_SITE_OTHER): Payer: BC Managed Care – PPO | Admitting: Physician Assistant

## 2014-08-26 ENCOUNTER — Ambulatory Visit: Payer: BC Managed Care – PPO | Admitting: Physician Assistant

## 2014-08-26 ENCOUNTER — Encounter: Payer: Self-pay | Admitting: Physician Assistant

## 2014-08-26 VITALS — BP 124/77 | HR 100 | Temp 98.4°F | Resp 16 | Ht 65.0 in | Wt 199.0 lb

## 2014-08-26 DIAGNOSIS — L739 Follicular disorder, unspecified: Secondary | ICD-10-CM

## 2014-08-26 MED ORDER — MINOCYCLINE HCL 100 MG PO CAPS: 100.0000 mg | ORAL_CAPSULE | Freq: Two times a day (BID) | ORAL | Status: DC

## 2014-08-26 NOTE — Assessment & Plan Note (Signed)
With surrounding cellulitis.  Some drainage obtained with palpation. Culture sent.  Will Rx Minocycline.  Keep area clean and dry.  Follow-up 1 week.

## 2014-08-26 NOTE — Progress Notes (Signed)
Patient presents to clinic today c/o inflamed hair follicle of left lower extremity with erythema, swelling and tenderness noted over the past few days.  Denies drainage from area.  Denies fever, chills, malaise/faitgue.  Past Medical History  Diagnosis Date  . Depression   . Hypertension   . Anemia     iron deficient, microcytic, hypochromic  . Anxiety   . Migraines   . 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 Fibroids     uterine  . Palpitations     recurrent  . Fatigue   . Tachycardia     unspecified  . Chest pain     atypical  . 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 Posey Pronto at [facility]- S/p FNA March/april of two dominant nodules- benign.  Following for annual thyroid US with Dr. Posey Pronto.    . Diabetes mellitus without complication   . Fatty liver 08/15/2013    Current Outpatient Prescriptions on File Prior to Visit  Medication Sig Dispense Refill  . ALPRAZolam (XANAX) 1 MG tablet Take 1 mg by mouth as needed. For anxiety    . aspirin 81 MG tablet Take 81 mg by mouth at bedtime.     . Biotin 2.5 MG TABS Take 2,500 mcg by mouth daily.    . butalbital-acetaminophen-caffeine (FIORICET, ESGIC) 50-325-40 MG per tablet Take 1 tablet by mouth as needed. migraine    . Cholecalciferol (VITAMIN D) 2000 UNITS CAPS Take 1 capsule by mouth daily.    . cyanocobalamin (,VITAMIN B-12,) 1000 MCG/ML injection Inject 1 mL into the muscle every 30 (thirty) days.    Marland Kitchen EPIPEN 2-PAK 0.3 MG/0.3ML SOAJ injection as needed.     Marland Kitchen FLUoxetine (PROZAC) 40 MG capsule Take 40 mg by mouth every morning.    . gabapentin (NEURONTIN) 100 MG capsule Take 300 mg by mouth at bedtime.     Marland Kitchen losartan (COZAAR) 100 MG tablet Take 100 mg by mouth at bedtime.    Marland Kitchen NUVIGIL 150 MG tablet TAKE ONE TABLET BY MOUTH ONCE DAILY 30 tablet 0  . omeprazole (PRILOSEC) 20 MG capsule Take 1 capsule (20 mg total) by mouth 2 (two) times daily before a meal. 20 capsule 0  . polyethylene glycol (MIRALAX /  GLYCOLAX) packet Take 17 g by mouth daily as needed for moderate constipation.    Marland Kitchen spironolactone (ALDACTONE) 25 MG tablet TAKE ONE TABLET BY MOUTH ONCE DAILY 30 tablet 2  . [DISCONTINUED] buPROPion (WELLBUTRIN XL) 150 MG 24 hr tablet Take 2 tablets (300 mg total) by mouth daily. 60 tablet 1   No current facility-administered medications on file prior to visit.    Allergies  Allergen Reactions  . Amlodipine Shortness Of Breath, Anxiety, Palpitations and Other (See Comments)  . Bee Venom Anaphylaxis  . Bupropion Shortness Of Breath, Anxiety, Palpitations and Other (See Comments)    Family History  Problem Relation Age of Onset  . Hypertension Mother   . Cancer Mother 49    breast  . Cancer Father     colon  . Stroke Brother     handicapped due to complications from spinal meningitis  . Seizures Brother   . Asthma Son   . Arthritis Maternal Grandmother   . Hypertension Maternal Grandmother   . Stroke Maternal Grandfather     History   Social History  . Marital Status: Married    Spouse Name: [name]    Number of Children: 2  .  Years of Education: N/A   Occupational History  . 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 Smoking status: Never Smoker   . Smokeless tobacco: Never Used     Comment: never used tobacco  . Alcohol Use: Yes     Comment: Once a month  . Drug Use: No  . Sexual Activity: Yes    Birth Control/ Protection: None, Surgical   Other Topics Concern  . 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  All other ROS are negative.  BP 124/77 mmHg  Pulse 100  Temp(Src) 98.4 F (36.9 C) (Oral)  Resp 16  Ht 5\' 5"  (1.651 m)  Wt 199 lb (90.266 kg)  BMI 33.12 kg/m2  SpO2 96%  LMP   Physical Exam  Constitutional: She is well-developed, well-nourished, and in  no distress.  HENT:  Head: Normocephalic and atraumatic.  Eyes: Conjunctivae are normal.  Cardiovascular: Normal rate, regular rhythm, normal heart sounds and intact distal pulses.   Pulmonary/Chest: Effort normal and breath sounds normal. No respiratory distress. She has no wheezes. She has no rales. She exhibits no tenderness.  Skin: Skin is warm and dry.     Psychiatric: Affect normal.  Vitals reviewed.   Recent Results (from the past 2160 hour(s))  Hepatic function panel     Status: None   Collection Time: 06/15/14 11:13 AM  Result Value Ref Range   Total Bilirubin 1.0 0.2 - 1.2 mg/dL   Bilirubin, Direct 0.0 0.0 - 0.3 mg/dL   Alkaline Phosphatase 60 39 - 117 U/L   AST 19 0 - 37 U/L   ALT 21 0 - 35 U/L   Total Protein 7.8 6.0 - 8.3 g/dL   Albumin 4.4 3.5 - 5.2 g/dL  Rocky mtn spotted fvr abs pnl(IgG+IgM)     Status: None   Collection Time: 06/15/14 11:13 AM  Result Value Ref Range   RMSF IgG 0.10 IV   RMSF IgM 0.08 IV    Comment:   IV = Index Value   REFERENCE INTERVAL [facility] Spotted Fever Antibody, IgG, IgM  IgG (IV)    IgM (IV)   Result           Interpretation: ---------   ---------  ---------         --------------- <0.80       <0.90      Negative    No significant level of Rickettsia                                    ricketsii IgG and/or IgM antibody                                    detected. If clinically indicated,                                    repeat sample within 7-14 days. 0.80-1.20   0.90-1.10  Equivocal   Questionable presence of Rickettsia  rickettsii IgG and/or IgM antibody                                    detected. Recommend recollecting                                    and retesting, if clinically                                    indicated. >1.20       >1.10      Positive    Presence of IgG and/or IgM antibody                                    to Rickettsia rickettsii detected.                                     IgG indicates evidence of prior                                    infection and may not indicate                                    active disease.  IgM suggestive                                    of current or recent infection. ThE RMSF IgM test was validated and its performance characteristics determined by [employer].  It has not been cleared or approved by the U.S. Food and Drug Administration.  The FDA has determined that such clearance or approval is not necessary.  This test is used for clinical purposes.  It should not be regarded as investigational or for research.  B. Burgdorfi Antibodies     Status: None   Collection Time: 06/15/14 11:13 AM  Result Value Ref Range   B burgdorferi Ab IgG+IgM 0.68 ISR    Comment: Antibody to Borrelia burgdorferi not detected.      ISR = Immune Status Ratio              <0.90         ISR       Negative              0.90 - 1.09   ISR       Equivocal              >=1.10        ISR       Positive  CBC with Differential     Status: None   Collection Time: 06/15/14 11:13 AM  Result Value Ref Range   WBC 6.1 4.0 - 10.5 K/uL   RBC 4.46 3.87 - 5.11 Mil/uL   Hemoglobin 13.6 12.0 - 15.0 g/dL   HCT 39.7 36.0 - 46.0 %   MCV 89.1 78.0 - 100.0 fl  MCHC 34.3 30.0 - 36.0 g/dL   RDW 14.3 11.5 - 15.5 %   Platelets 185.0 150.0 - 400.0 K/uL   Neutrophils Relative % 61.1 43.0 - 77.0 %   Lymphocytes Relative 31.7 12.0 - 46.0 %   Monocytes Relative 6.4 3.0 - 12.0 %   Eosinophils Relative 0.5 0.0 - 5.0 %   Basophils Relative 0.3 0.0 - 3.0 %   Neutro Abs 3.7 1.4 - 7.7 K/uL   Lymphs Abs 1.9 0.7 - 4.0 K/uL   Monocytes Absolute 0.4 0.1 - 1.0 K/uL   Eosinophils Absolute 0.0 0.0 - 0.7 K/uL   Basophils Absolute 0.0 0.0 - 0.1 K/uL  TSH     Status: None   Collection Time: 06/15/14 11:13 AM  Result Value Ref Range   TSH 0.73 0.35 - 4.50 uIU/mL  H. pylori antibody, IgG     Status: Abnormal   Collection Time: 06/15/14 11:13 AM    Result Value Ref Range   H Pylori IgG Positive (A) Negative  Lipid panel     Status: Abnormal   Collection Time: 06/15/14 11:13 AM  Result Value Ref Range   Cholesterol 180 0 - 200 mg/dL    Comment: ATP III Classification       Desirable:  < 200 mg/dL               Borderline High:  200 - 239 mg/dL          High:  > = 240 mg/dL   Triglycerides 130.0 0.0 - 149.0 mg/dL    Comment: Normal:  <150 mg/dLBorderline High:  150 - 199 mg/dL   HDL 34.60 (L) >39.00 mg/dL   VLDL 26.0 0.0 - 40.0 mg/dL   LDL Cholesterol 119 (H) 0 - 99 mg/dL   Total CHOL/HDL Ratio 5     Comment:                Men          Women1/2 Average Risk     3.4          3.3Average Risk          5.0          4.42X Average Risk          9.6          7.13X Average Risk          15.0          11.0                       NonHDL 145.40     Comment: NOTE:  Non-HDL goal should be 30 mg/dL higher than patient's LDL goal (i.e. LDL goal of < 70 mg/dL, would have non-HDL goal of < 100 mg/dL)    Assessment/Plan: Folliculitis With surrounding cellulitis.  Some drainage obtained with palpation. Culture sent.  Will Rx Minocycline.  Keep area clean and dry.  Follow-up 1 week.

## 2014-08-26 NOTE — Patient Instructions (Signed)
Please keep area clean and dry.  Take antibiotic as directed.  Take a daily probiotic. Call if symptoms are not improving.  I will call you with your culture results.

## 2014-08-26 NOTE — Progress Notes (Signed)
Pre visit review using our clinic review tool, if applicable. No additional management support is needed unless otherwise documented below in the visit note/SLS  

## 2014-08-28 ENCOUNTER — Encounter: Payer: Self-pay | Admitting: Family

## 2014-08-29 LAB — WOUND CULTURE
GRAM STAIN: NONE SEEN
GRAM STAIN: NONE SEEN
GRAM STAIN: NONE SEEN
Organism ID, Bacteria: NO GROWTH

## 2014-09-01 ENCOUNTER — Other Ambulatory Visit: Payer: Self-pay | Admitting: Cardiovascular Disease

## 2014-09-18 ENCOUNTER — Encounter (HOSPITAL_BASED_OUTPATIENT_CLINIC_OR_DEPARTMENT_OTHER): Payer: Self-pay

## 2014-09-18 ENCOUNTER — Emergency Department (HOSPITAL_BASED_OUTPATIENT_CLINIC_OR_DEPARTMENT_OTHER)
Admission: EM | Admit: 2014-09-18 | Discharge: 2014-09-18 | Disposition: A | Discharge status: Home / Self Care | Payer: BLUE CROSS/BLUE SHIELD | Attending: Emergency Medicine | Admitting: Emergency Medicine

## 2014-09-18 ENCOUNTER — Emergency Department (HOSPITAL_BASED_OUTPATIENT_CLINIC_OR_DEPARTMENT_OTHER): Payer: BLUE CROSS/BLUE SHIELD

## 2014-09-18 DIAGNOSIS — R079 Chest pain, unspecified: Secondary | ICD-10-CM | POA: Insufficient documentation

## 2014-09-18 DIAGNOSIS — Z862 Personal history of diseases of the blood and blood-forming organs and certain disorders involving the immune mechanism: Secondary | ICD-10-CM | POA: Insufficient documentation

## 2014-09-18 DIAGNOSIS — G43909 Migraine, unspecified, not intractable, without status migrainosus: Secondary | ICD-10-CM | POA: Insufficient documentation

## 2014-09-18 DIAGNOSIS — Z8742 Personal history of other diseases of the female genital tract: Secondary | ICD-10-CM | POA: Insufficient documentation

## 2014-09-18 DIAGNOSIS — Z8719 Personal history of other diseases of the digestive system: Secondary | ICD-10-CM | POA: Diagnosis not present

## 2014-09-18 DIAGNOSIS — R42 Dizziness and giddiness: Secondary | ICD-10-CM | POA: Diagnosis not present

## 2014-09-18 DIAGNOSIS — Z7982 Long term (current) use of aspirin: Secondary | ICD-10-CM | POA: Insufficient documentation

## 2014-09-18 DIAGNOSIS — E119 Type 2 diabetes mellitus without complications: Secondary | ICD-10-CM | POA: Insufficient documentation

## 2014-09-18 DIAGNOSIS — Z79899 Other long term (current) drug therapy: Secondary | ICD-10-CM | POA: Diagnosis not present

## 2014-09-18 DIAGNOSIS — I10 Essential (primary) hypertension: Secondary | ICD-10-CM | POA: Diagnosis not present

## 2014-09-18 LAB — BASIC METABOLIC PANEL
Anion gap: 7 (ref 5–15)
BUN: 9 mg/dL (ref 6–23)
CO2: 26 mmol/L (ref 19–32)
CREATININE: 0.72 mg/dL (ref 0.50–1.10)
Calcium: 9.2 mg/dL (ref 8.4–10.5)
Chloride: 106 mEq/L (ref 96–112)
GFR calc Af Amer: >90 mL/min (ref >90)
Glucose, Bld: 161 mg/dL — ABNORMAL HIGH (ref 70–99)
POTASSIUM: 3.6 mmol/L (ref 3.5–5.1)
Sodium: 139 mmol/L (ref 135–145)

## 2014-09-18 LAB — CBC WITH DIFFERENTIAL/PLATELET
Basophils Absolute: 0 10*3/uL (ref 0.0–0.1)
Basophils Relative: 0 % (ref 0–1)
EOS ABS: 0.1 10*3/uL (ref 0.0–0.7)
Eosinophils Relative: 1 % (ref 0–5)
HEMATOCRIT: 39.6 % (ref 36.0–46.0)
HEMOGLOBIN: 13.8 g/dL (ref 12.0–15.0)
LYMPHS ABS: 2.4 10*3/uL (ref 0.7–4.0)
LYMPHS PCT: 38 % (ref 12–46)
MCH: 30.3 pg (ref 26.0–34.0)
MCHC: 34.8 g/dL (ref 30.0–36.0)
MCV: 87 fL (ref 78.0–100.0)
Monocytes Absolute: 0.5 10*3/uL (ref 0.1–1.0)
Monocytes Relative: 8 % (ref 3–12)
Neutro Abs: 3.5 10*3/uL (ref 1.7–7.7)
Neutrophils Relative %: 53 % (ref 43–77)
PLATELETS: 186 10*3/uL (ref 150–400)
RBC: 4.55 MIL/uL (ref 3.87–5.11)
RDW: 13 % (ref 11.5–15.5)
WBC: 6.5 10*3/uL (ref 4.0–10.5)

## 2014-09-18 LAB — TROPONIN I

## 2014-09-18 MED ORDER — ACETAMINOPHEN 325 MG PO TABS
650.0000 mg | ORAL_TABLET | Freq: Once | ORAL | Status: AC
  Administered 2014-09-18: 650 mg via ORAL
  Filled 2014-09-18: qty 2

## 2014-09-18 MED ORDER — MECLIZINE HCL 25 MG PO TABS
25.0000 mg | ORAL_TABLET | Freq: Once | ORAL | Status: AC
  Administered 2014-09-18: 25 mg via ORAL
  Filled 2014-09-18: qty 1

## 2014-09-18 MED ORDER — MECLIZINE HCL 25 MG PO TABS: 25.0000 mg | ORAL_TABLET | Freq: Three times a day (TID) | ORAL | Status: DC | PRN

## 2014-09-18 NOTE — ED Notes (Signed)
PA at bedside.

## 2014-09-18 NOTE — ED Notes (Signed)
Pt reports htn, CP, dizziness and SHOB today.

## 2014-09-18 NOTE — Discharge Instructions (Signed)

## 2014-09-18 NOTE — ED Provider Notes (Signed)
CSN: [id number]     Arrival date & time 09/18/14  73 History   First MD Initiated Contact with Patient 09/18/14 1816     Chief Complaint  Patient presents with  . Dizziness     (Consider location/radiation/quality/duration/timing/severity/associated sxs/prior Treatment) Patient is a 44 y.o. female presenting with dizziness. The history is provided by the patient. No language interpreter was used.  Dizziness Quality:  Head spinning Severity:  Moderate Onset quality:  Gradual Duration:  1 day Timing:  Intermittent Context: head movement and standing up   Worsened by:  Standing up and sitting upright Associated symptoms: chest pain and headaches     Past Medical History  Diagnosis Date  . Depression   . Hypertension   . Anemia     iron deficient, microcytic, hypochromic  . Anxiety   . Migraines   . 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 Fibroids     uterine  . Palpitations     recurrent  . Fatigue   . Tachycardia     unspecified  . Chest pain     atypical  . 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 Posey Pronto at [facility]- S/p FNA March/april of two dominant nodules- benign.  Following for annual thyroid US with Dr. Posey Pronto.    . Diabetes mellitus without complication   . Fatty liver 08/15/2013   Past Surgical History  Procedure Laterality Date  . Appendectomy  03/2009  . Tubal ligation  1997  . Tuboplasty / tubotubal anastomosis  2005  . Biopsy thyroid  November 28, 2009    Dr. Posey Pronto- endo  . Dilation and curettage of uterus  05/21/2011    Procedure: DILATATION AND CURETTAGE (D&C);  Surgeon: Elveria Royals;  Location: [place];  Service: Gynecology;  Laterality: N/A;  dilitation and currettage/endometrial currettings   Family History  Problem Relation Age of Onset  . Hypertension Mother   . Cancer Mother 25    breast  . Cancer Father     colon  . Stroke Brother     handicapped due to complications from spinal meningitis  . Seizures Brother   .  Asthma Son   . Arthritis Maternal Grandmother   . Hypertension Maternal Grandmother   . Stroke Maternal Grandfather    History  Substance Use Topics  . Smoking status: Never Smoker   . Smokeless tobacco: Never Used     Comment: never used tobacco  . Alcohol Use: Yes     Comment: Once a month   OB History    Gravida Para Term Preterm AB TAB SAB Ectopic Multiple Living   4 2 1 1 2 1  1  2      Review of Systems  Respiratory: Positive for chest tightness.   Cardiovascular: Positive for chest pain.  Neurological: Positive for dizziness and headaches. Negative for weakness and numbness.  All other systems reviewed and are negative.     Allergies  Amlodipine; Bee venom; and Bupropion  Home Medications   Prior to Admission medications   Medication Sig Start Date End Date Taking? Authorizing Provider  ALPRAZolam Duanne Moron) 1 MG tablet Take 1 mg by mouth as needed. For anxiety    Historical Provider, MD  aspirin 81 MG tablet Take 81 mg by mouth at bedtime.     Historical Provider, MD  Biotin 2.5 MG TABS Take 2,500 mcg by mouth daily.    Historical Provider, MD  butalbital-acetaminophen-caffeine (FIORICET, ESGIC) 50-325-40 MG per tablet  Take 1 tablet by mouth as needed. migraine 05/27/14   Historical Provider, MD  Cholecalciferol (VITAMIN D) 2000 UNITS CAPS Take 1 capsule by mouth daily.    Historical Provider, MD  cyanocobalamin (,VITAMIN B-12,) 1000 MCG/ML injection Inject 1 mL into the muscle every 30 (thirty) days. 04/15/14   Historical Provider, MD  EPIPEN 2-PAK 0.3 MG/0.3ML SOAJ injection as needed.  05/14/13   Historical Provider, MD  FLUoxetine (PROZAC) 40 MG capsule Take 40 mg by mouth every morning.    Historical Provider, MD  gabapentin (NEURONTIN) 100 MG capsule Take 300 mg by mouth at bedtime.     Historical Provider, MD  losartan (COZAAR) 100 MG tablet Take 100 mg by mouth at bedtime.    Historical Provider, MD  losartan (COZAAR) 100 MG tablet TAKE ONE TABLET BY MOUTH ONCE  DAILY 09/02/14   Blane Ohara, MD  minocycline (MINOCIN) 100 MG capsule Take 1 capsule (100 mg total) by mouth 2 (two) times daily. 08/26/14   Brunetta Jeans, PA-C  NUVIGIL 150 MG tablet TAKE ONE TABLET BY MOUTH ONCE DAILY 08/12/14   Debbrah Alar, NP  omeprazole (PRILOSEC) 20 MG capsule Take 1 capsule (20 mg total) by mouth 2 (two) times daily before a meal. 06/17/14   Debbrah Alar, NP  polyethylene glycol (MIRALAX / GLYCOLAX) packet Take 17 g by mouth daily as needed for moderate constipation.    Historical Provider, MD  spironolactone (ALDACTONE) 25 MG tablet TAKE ONE TABLET BY MOUTH ONCE DAILY 07/20/14   Debbrah Alar, NP   BP 146/85 mmHg  Pulse 96  Temp(Src) 98.3 F (36.8 C) (Oral)  Resp 18  Ht 5\' 4"  (1.626 m)  Wt 200 lb (90.719 kg)  BMI 34.31 kg/m2  SpO2 98% Physical Exam  Constitutional: She is oriented to person, place, and time. She appears well-developed and well-nourished.  HENT:  Head: Normocephalic.  Eyes: EOM are normal. Pupils are equal, round, and reactive to light.  Neck: Neck supple.  Cardiovascular: Normal rate and regular rhythm.   Pulmonary/Chest: Effort normal and breath sounds normal.  Abdominal: Soft. Bowel sounds are normal.  Musculoskeletal: She exhibits no edema or tenderness.  Lymphadenopathy:    She has no cervical adenopathy.  Neurological: She is alert and oriented to person, place, and time. She has normal strength. No cranial nerve deficit or sensory deficit. Coordination normal. GCS eye subscore is 4. GCS verbal subscore is 5. GCS motor subscore is 6.  Negative test of skew  Skin: Skin is warm and dry.  Psychiatric: She has a normal mood and affect.  Nursing note and vitals reviewed.   ED Course  Procedures (including critical care time) Labs Review Labs Reviewed  CBC WITH DIFFERENTIAL  BASIC METABOLIC PANEL  TROPONIN I    Imaging Review No results found.   EKG Interpretation   Date/Time:  Friday September 18 2014  17:41:14 EST Ventricular Rate:  104 PR Interval:  130 QRS Duration: 84 QT Interval:  346 QTC Calculation: 308 R Axis:   62 Text Interpretation:  Sinus tachycardia Nonspecific T wave abnormality  Abnormal ECG No significant change since last tracing Confirmed by POLLINA   MD, [place] [phone]) on 09/18/2014 6:11:05 PM     Patient with history of anxiety and hypertension presents to ED with complaint of dizziness and shortness of breath.  BP initially elevated.  Shortness of breath has been associated with bronchitis, and patient has been on several rounds of antibiotics without resolution.  Non-productive cough, no fever  or wheezing.  Dizziness is primarily positional, with a description of spinning around the room.  Lab and radiology results reviewed and shared with patient.  No acute findings on CT. Patient feels better and dizziness has improved after meclizine.  Blood pressure has normalized.  No concerning findings on exam.  Return precautions discussed.  Patient to follow-up with PCP. MDM   Final diagnoses:  None    Dizziness.    Norman Herrlich, NP 09/18/14 1224  Orpah Greek, MD 09/18/14 2352

## 2014-09-24 ENCOUNTER — Telehealth: Payer: Self-pay | Admitting: Family

## 2014-09-24 NOTE — Telephone Encounter (Signed)
[facility] Day - Client TELEPHONE ADVICE RECORD [facility] Patient Name: Michele [name] DOB: Apr 17, 1971 Nurse Assessment Nurse: Markus Daft, RN, [place] Date/Time (Eastern Time): 09/24/2014 1:06:52 PM Confirm and document reason for call. If symptomatic, describe symptoms. ---Caller states that today her BP is 163/102 HR 108, feels very dizzy (not vertigo) noticed with sudden changes and standing, feels hot like a hot flash. She also feels nervous. She's had these s/s 10 days. -- Seen in ER last Friday with elevated BP, fast HR, headache, and dizziness. CT scan was "normal." Given 2 Tylenol and Antivert and sent home. --She had been on Cymbalta and s.e. of sweating and hot flashes and advised to stop September 10, 2014. She sees Dr Debbrah Alar, NP. Has the patient traveled out of the country within the last 30 days? ---Not Applicable Does the patient require triage? ---Yes Related visit to physician within the last 2 weeks? ---Yes Does the PT have any chronic conditions? (i.e. diabetes, asthma, etc.) ---Yes List chronic conditions. ---HTN - Losartan 100 mg QD; Spirnolactone 25 mg QD, and ASA 81 mg QD; Borderline DM - high A1C, Anxiety and Depression Did the patient indicate they were pregnant? ---No Guidelines Guideline Title Affirmed Question Affirmed Notes Headache [1] MILD-MODERATE headache AND [2] present > 72 hours Using BC powder for the pain - has had 2 of these today, last taken at 8:30 am, she has been taking 2 every 4 hours. HA since Friday. Rates 7/10 HA. It feels like a migraine but not getting any relief. High Blood Pressure BP # 160/100 Final Disposition User See PCP When Office is Open (within 3 days) Markus Daft, Therapist, sports, [employer]

## 2014-09-24 NOTE — Telephone Encounter (Signed)
Do you feel that it is ok for this patient to wait until next Wed. You are booked for tomorrow and she refused to see anyone but you.

## 2014-09-25 ENCOUNTER — Ambulatory Visit: Payer: Self-pay | Admitting: Physician Assistant

## 2014-09-25 NOTE — Telephone Encounter (Signed)
Spoke with patient. She reports that she contacted [facility] (they manage her BP) due to HR 120 and BP 160/100s and they are going to see her at 9:30 this morning. OK to keep apt with me as scheduled.

## 2014-09-30 ENCOUNTER — Ambulatory Visit (INDEPENDENT_AMBULATORY_CARE_PROVIDER_SITE_OTHER): Payer: BLUE CROSS/BLUE SHIELD | Admitting: Family

## 2014-09-30 ENCOUNTER — Telehealth: Payer: Self-pay | Admitting: *Deleted

## 2014-09-30 VITALS — BP 130/90 | HR 101 | Temp 98.0°F | Resp 20 | Ht 65.0 in | Wt 207.6 lb

## 2014-09-30 DIAGNOSIS — G43809 Other migraine, not intractable, without status migrainosus: Secondary | ICD-10-CM

## 2014-09-30 DIAGNOSIS — R739 Hyperglycemia, unspecified: Secondary | ICD-10-CM

## 2014-09-30 DIAGNOSIS — R000 Tachycardia, unspecified: Secondary | ICD-10-CM

## 2014-09-30 DIAGNOSIS — H8110 Benign paroxysmal vertigo, unspecified ear: Secondary | ICD-10-CM

## 2014-09-30 DIAGNOSIS — R51 Headache: Secondary | ICD-10-CM

## 2014-09-30 DIAGNOSIS — R519 Headache, unspecified: Secondary | ICD-10-CM

## 2014-09-30 DIAGNOSIS — G4733 Obstructive sleep apnea (adult) (pediatric): Secondary | ICD-10-CM

## 2014-09-30 DIAGNOSIS — I10 Essential (primary) hypertension: Secondary | ICD-10-CM

## 2014-09-30 MED ORDER — KETOROLAC TROMETHAMINE 60 MG/2ML IM SOLN
60.0000 mg | Freq: Once | INTRAMUSCULAR | Status: AC
  Administered 2014-09-30: 60 mg via INTRAMUSCULAR

## 2014-09-30 MED ORDER — SUMATRIPTAN SUCCINATE 50 MG PO TABS: 50.0000 mg | ORAL_TABLET | ORAL | Status: DC | PRN

## 2014-09-30 NOTE — Progress Notes (Signed)
Subjective:    Patient ID: Michele [name], female    DOB: 02-21-1971, 44 y.o.   MRN: [id number]  HPI  [name] is a 44 yr old female who presents today for ED follow up. She was evaluated 09/18/14 in the ED with chief complaint of dizziness.   BP was elevated on admission.  It was felt that her shortness of breath was associated with bronchitis.  CT head was negative for acute changes. She was given meclizine which made her sleepy. She took tylenol. She continues to have dizziness, worse with certain movement, worse with bending forward.  She saw [facility] on 1/14.  She was placed on coreg.  Overall, she continues to have severe fatigue. Reports + HA with light sensitivity. Reports + CPAP compliance but husband tells her that he still sees her "gasping" for air while she sleeps.  She has upcoming appointment with sleep specialist at [facility] in February.  Reports that her fasting glucose was 228 at [facility]. A1C was 6.2 .   Wt Readings from Last 3 Encounters:  09/30/14 207 lb 9.6 oz (94.167 kg)  09/18/14 200 lb (90.719 kg)  08/26/14 199 lb (90.266 kg)   BP Readings from Last 3 Encounters:  09/30/14 130/90  09/18/14 123/77  08/26/14 124/77     Lab Results  Component Value Date   HGBA1C 5.8* 05/02/2013   Reports that when she takes her blood pressure at home it is high.  She reports that manual checks are not that high.   Review of Systems See HPI  Past Medical History  Diagnosis Date  . Depression   . Hypertension   . Anemia     iron deficient, microcytic, hypochromic  . Anxiety   . Migraines   . 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 Fibroids     uterine  . Palpitations     recurrent  . Fatigue   . Tachycardia     unspecified  . Chest pain     atypical  . 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 Posey Pronto at [facility]- S/p FNA March/april of two dominant nodules- benign.  Following for annual thyroid US with Dr. Posey Pronto.    . Diabetes  mellitus without complication   . Fatty liver 08/15/2013    History   Social History  . Marital Status: Married    Spouse Name: [name]    Number of Children: 2  . Years of Education: N/A   Occupational History  . 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 Smoking status: Never Smoker   . Smokeless tobacco: Never Used     Comment: never used tobacco  . Alcohol Use: Yes     Comment: Once a month  . Drug Use: No  . Sexual Activity: Yes    Birth Control/ Protection: None, Surgical   Other Topics Concern  . 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 Appendectomy  03/2009  . Tubal ligation  1997  . Tuboplasty / tubotubal anastomosis  2005  . Biopsy thyroid  November 28, 2009    Dr. Posey Pronto- endo  . Dilation and curettage of uterus  05/21/2011    Procedure: DILATATION AND CURETTAGE (D&C);  Surgeon: Elveria Royals;  Location: [place];  Service: Gynecology;  Laterality: N/A;  dilitation and currettage/endometrial currettings    Family History  Problem Relation Age of Onset  . Hypertension Mother   . Cancer Mother 73    breast  . Cancer Father     colon  . Stroke Brother     handicapped due to complications from spinal meningitis  . Seizures Brother   . Asthma Son   . Arthritis Maternal Grandmother   . Hypertension Maternal Grandmother   . Stroke Maternal Grandfather     Allergies  Allergen Reactions  . Amlodipine Shortness Of Breath, Anxiety, Palpitations and Other (See Comments)  . Bee Venom Anaphylaxis  . Bupropion Shortness Of Breath, Anxiety, Palpitations and Other (See Comments)    Current Outpatient Prescriptions on File Prior to Visit  Medication Sig Dispense Refill  . ALPRAZolam (XANAX) 1 MG tablet Take 1 mg by mouth as needed. For  anxiety    . aspirin 81 MG tablet Take 81 mg by mouth at bedtime.     . Biotin 2.5 MG TABS Take 2,500 mcg by mouth daily.    . Cholecalciferol (VITAMIN D) 2000 UNITS CAPS Take 1 capsule by mouth daily.    . cyanocobalamin (,VITAMIN B-12,) 1000 MCG/ML injection Inject 1 mL into the muscle every 30 (thirty) days.    Marland Kitchen EPIPEN 2-PAK 0.3 MG/0.3ML SOAJ injection as needed.     Marland Kitchen FLUoxetine (PROZAC) 40 MG capsule Take 40 mg by mouth every morning.    . gabapentin (NEURONTIN) 100 MG capsule Take 300 mg by mouth at bedtime.     Marland Kitchen losartan (COZAAR) 100 MG tablet Take 100 mg by mouth at bedtime.    Marland Kitchen spironolactone (ALDACTONE) 25 MG tablet TAKE ONE TABLET BY MOUTH ONCE DAILY 30 tablet 2  . losartan (COZAAR) 100 MG tablet TAKE ONE TABLET BY MOUTH ONCE DAILY (Patient not taking: Reported on 09/30/2014) 90 tablet 0  . meclizine (ANTIVERT) 25 MG tablet Take 1 tablet (25 mg total) by mouth 3 (three) times daily as needed for dizziness. (Patient not taking: Reported on 09/30/2014) 30 tablet 0  . minocycline (MINOCIN) 100 MG capsule Take 1 capsule (100 mg total) by mouth 2 (two) times daily. (Patient not taking: Reported on 09/30/2014) 14 capsule 0  . NUVIGIL 150 MG tablet TAKE ONE TABLET BY MOUTH ONCE DAILY (Patient not taking: Reported on 09/30/2014) 30 tablet 0  . omeprazole (PRILOSEC) 20 MG capsule Take 1 capsule (20 mg total) by mouth 2 (two) times daily before a meal. (Patient not taking: Reported on 09/30/2014) 20 capsule 0  . polyethylene glycol (MIRALAX / GLYCOLAX) packet Take 17 g by mouth daily as needed for moderate constipation.    . [DISCONTINUED] buPROPion (WELLBUTRIN XL) 150 MG 24 hr tablet Take 2 tablets (300 mg total) by mouth daily. 60 tablet 1   No current facility-administered medications on file prior to visit.    BP 130/90 mmHg  Pulse 101  Temp(Src) 98 F (36.7 C) (Oral)  Resp 20  Ht 5\' 5"  (1.651 m)  Wt 207 lb 9.6 oz (94.167 kg)  BMI 34.55 kg/m2  SpO2 97%       Objective:    Physical Exam  Constitutional: She is oriented to person, place, and time. She appears well-developed and well-nourished. No distress.  Tired appearing  Cardiovascular: Regular rhythm.   No murmur heard. Mild tachycardia noted  Pulmonary/Chest: Effort  normal and breath sounds normal. No respiratory distress. She has no wheezes. She has no rales. She exhibits no tenderness.  Neurological: She is alert and oriented to person, place, and time.  Skin: Skin is warm and dry.  Psychiatric: Her behavior is normal. Judgment and thought content normal.  Mildly anxious appearin          Assessment & Plan:

## 2014-09-30 NOTE — Patient Instructions (Addendum)
Complete lab work prior to leaving. (blood work and UDS) You may use imitrex as needed for headache (max 2 doses in 24 hours). Keep your upcoming appointment with the Sleep Medicine Doctor in February. Follow up in 1 month.

## 2014-09-30 NOTE — Telephone Encounter (Signed)
Rx for imitrex printed instead of transmitting via eRx at today's visit. Rx faxed to [facility].

## 2014-09-30 NOTE — Progress Notes (Signed)
Pre visit review using our clinic review tool, if applicable. No additional management support is needed unless otherwise documented below in the visit note. 

## 2014-10-01 DIAGNOSIS — G43909 Migraine, unspecified, not intractable, without status migrainosus: Secondary | ICD-10-CM | POA: Insufficient documentation

## 2014-10-01 LAB — HEMOGLOBIN A1C: Hgb A1c MFr Bld: 7 % — ABNORMAL HIGH (ref 4.6–6.5)

## 2014-10-01 NOTE — Assessment & Plan Note (Signed)
Meclizine as tolerated. Consider referral for Vestibular rehab if symptoms do not improve.

## 2014-10-01 NOTE — Assessment & Plan Note (Signed)
Advised pt to keep upcoming appointment with sleep specialist. She has had some weight gain so this could have affected the severity of her sleep apnea.  If OSA is suboptimally treated, then this could explain fatigue/headache and recently difficult to control hypertension.

## 2014-10-01 NOTE — Assessment & Plan Note (Signed)
Trial of prn imitrex.

## 2014-10-01 NOTE — Assessment & Plan Note (Signed)
Will repeat A1C.  Sugar obtained at [facility] was in the diabetic range.

## 2014-10-01 NOTE — Assessment & Plan Note (Signed)
BP looks good here today.  Continue current meds.

## 2014-10-02 ENCOUNTER — Encounter: Payer: Self-pay | Admitting: Family

## 2014-10-02 DIAGNOSIS — E669 Obesity, unspecified: Secondary | ICD-10-CM

## 2014-10-08 ENCOUNTER — Emergency Department (HOSPITAL_BASED_OUTPATIENT_CLINIC_OR_DEPARTMENT_OTHER)
Admission: EM | Admit: 2014-10-08 | Discharge: 2014-10-08 | Disposition: A | Discharge status: Other Acute Inpatient Hospital | Payer: BLUE CROSS/BLUE SHIELD | Attending: Emergency Medicine | Admitting: Emergency Medicine

## 2014-10-08 ENCOUNTER — Encounter (HOSPITAL_BASED_OUTPATIENT_CLINIC_OR_DEPARTMENT_OTHER): Payer: Self-pay

## 2014-10-08 DIAGNOSIS — E119 Type 2 diabetes mellitus without complications: Secondary | ICD-10-CM | POA: Diagnosis not present

## 2014-10-08 DIAGNOSIS — Z23 Encounter for immunization: Secondary | ICD-10-CM | POA: Insufficient documentation

## 2014-10-08 DIAGNOSIS — Z8719 Personal history of other diseases of the digestive system: Secondary | ICD-10-CM | POA: Insufficient documentation

## 2014-10-08 DIAGNOSIS — T2112XA Burn of first degree of abdominal wall, initial encounter: Secondary | ICD-10-CM | POA: Diagnosis not present

## 2014-10-08 DIAGNOSIS — G43909 Migraine, unspecified, not intractable, without status migrainosus: Secondary | ICD-10-CM | POA: Diagnosis not present

## 2014-10-08 DIAGNOSIS — Y9289 Other specified places as the place of occurrence of the external cause: Secondary | ICD-10-CM | POA: Insufficient documentation

## 2014-10-08 DIAGNOSIS — I10 Essential (primary) hypertension: Secondary | ICD-10-CM | POA: Diagnosis not present

## 2014-10-08 DIAGNOSIS — X12XXXA Contact with other hot fluids, initial encounter: Secondary | ICD-10-CM | POA: Insufficient documentation

## 2014-10-08 DIAGNOSIS — F419 Anxiety disorder, unspecified: Secondary | ICD-10-CM | POA: Insufficient documentation

## 2014-10-08 DIAGNOSIS — T2027XA Burn of second degree of neck, initial encounter: Secondary | ICD-10-CM | POA: Diagnosis not present

## 2014-10-08 DIAGNOSIS — Y998 Other external cause status: Secondary | ICD-10-CM | POA: Insufficient documentation

## 2014-10-08 DIAGNOSIS — T2220XA Burn of second degree of shoulder and upper limb, except wrist and hand, unspecified site, initial encounter: Secondary | ICD-10-CM

## 2014-10-08 DIAGNOSIS — Z7982 Long term (current) use of aspirin: Secondary | ICD-10-CM | POA: Insufficient documentation

## 2014-10-08 DIAGNOSIS — Z8742 Personal history of other diseases of the female genital tract: Secondary | ICD-10-CM | POA: Diagnosis not present

## 2014-10-08 DIAGNOSIS — Y9389 Activity, other specified: Secondary | ICD-10-CM | POA: Insufficient documentation

## 2014-10-08 DIAGNOSIS — F329 Major depressive disorder, single episode, unspecified: Secondary | ICD-10-CM | POA: Diagnosis not present

## 2014-10-08 DIAGNOSIS — Z862 Personal history of diseases of the blood and blood-forming organs and certain disorders involving the immune mechanism: Secondary | ICD-10-CM | POA: Diagnosis not present

## 2014-10-08 DIAGNOSIS — T22232A Burn of second degree of left upper arm, initial encounter: Secondary | ICD-10-CM | POA: Insufficient documentation

## 2014-10-08 DIAGNOSIS — Z79899 Other long term (current) drug therapy: Secondary | ICD-10-CM | POA: Insufficient documentation

## 2014-10-08 DIAGNOSIS — T310 Burns involving less than 10% of body surface: Secondary | ICD-10-CM | POA: Insufficient documentation

## 2014-10-08 LAB — CBC WITH DIFFERENTIAL/PLATELET
BASOS PCT: 0 % (ref 0–1)
Basophils Absolute: 0 10*3/uL (ref 0.0–0.1)
EOS ABS: 0.1 10*3/uL (ref 0.0–0.7)
Eosinophils Relative: 1 % (ref 0–5)
HEMATOCRIT: 42.5 % (ref 36.0–46.0)
Hemoglobin: 14.5 g/dL (ref 12.0–15.0)
Lymphocytes Relative: 26 % (ref 12–46)
Lymphs Abs: 1.9 10*3/uL (ref 0.7–4.0)
MCH: 29.9 pg (ref 26.0–34.0)
MCHC: 34.1 g/dL (ref 30.0–36.0)
MCV: 87.6 fL (ref 78.0–100.0)
Monocytes Absolute: 0.7 10*3/uL (ref 0.1–1.0)
Monocytes Relative: 10 % (ref 3–12)
NEUTROS ABS: 4.6 10*3/uL (ref 1.7–7.7)
NEUTROS PCT: 63 % (ref 43–77)
PLATELETS: 247 10*3/uL (ref 150–400)
RBC: 4.85 MIL/uL (ref 3.87–5.11)
RDW: 13.8 % (ref 11.5–15.5)
WBC: 7.3 10*3/uL (ref 4.0–10.5)

## 2014-10-08 LAB — BASIC METABOLIC PANEL
Anion gap: 5 (ref 5–15)
BUN: 13 mg/dL (ref 6–23)
CALCIUM: 9.2 mg/dL (ref 8.4–10.5)
CO2: 25 mmol/L (ref 19–32)
Chloride: 105 mmol/L (ref 96–112)
Creatinine, Ser: 0.78 mg/dL (ref 0.50–1.10)
GFR calc Af Amer: >90 mL/min (ref >90)
GFR calc non Af Amer: >90 mL/min (ref >90)
Glucose, Bld: 122 mg/dL — ABNORMAL HIGH (ref 70–99)
POTASSIUM: 4.1 mmol/L (ref 3.5–5.1)
SODIUM: 135 mmol/L (ref 135–145)

## 2014-10-08 MED ORDER — TETANUS-DIPHTH-ACELL PERTUSSIS 5-2.5-18.5 LF-MCG/0.5 IM SUSP
0.5000 mL | Freq: Once | INTRAMUSCULAR | Status: AC
  Administered 2014-10-08: 0.5 mL via INTRAMUSCULAR
  Filled 2014-10-08: qty 0.5

## 2014-10-08 MED ORDER — ONDANSETRON HCL 4 MG/2ML IJ SOLN
4.0000 mg | Freq: Once | INTRAMUSCULAR | Status: AC
  Administered 2014-10-08: 4 mg via INTRAVENOUS
  Filled 2014-10-08: qty 2

## 2014-10-08 MED ORDER — MORPHINE SULFATE 4 MG/ML IJ SOLN
4.0000 mg | Freq: Once | INTRAMUSCULAR | Status: AC
  Administered 2014-10-08: 4 mg via INTRAVENOUS
  Filled 2014-10-08: qty 1

## 2014-10-08 MED ORDER — SODIUM CHLORIDE 0.9 % IV BOLUS (SEPSIS)
1000.0000 mL | Freq: Once | INTRAVENOUS | Status: AC
  Administered 2014-10-08: 1000 mL via INTRAVENOUS

## 2014-10-08 MED ORDER — SODIUM CHLORIDE 0.9 % IV SOLN: INTRAVENOUS | Status: DC
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
## 2014-10-08 NOTE — ED Notes (Signed)
[facility] at bedside preparing for transport 

## 2014-10-08 NOTE — ED Notes (Signed)
Report called to [facility] 
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
## 2014-10-27 DIAGNOSIS — M792 Neuralgia and neuritis, unspecified: Secondary | ICD-10-CM | POA: Insufficient documentation

## 2014-11-03 ENCOUNTER — Encounter: Payer: Self-pay | Admitting: Family

## 2014-11-03 ENCOUNTER — Ambulatory Visit (INDEPENDENT_AMBULATORY_CARE_PROVIDER_SITE_OTHER): Payer: BLUE CROSS/BLUE SHIELD | Admitting: Family

## 2014-11-03 VITALS — BP 142/88 | HR 92 | Temp 98.1°F | Resp 16 | Wt 210.1 lb

## 2014-11-03 DIAGNOSIS — F32A Depression, unspecified: Secondary | ICD-10-CM

## 2014-11-03 DIAGNOSIS — M797 Fibromyalgia: Secondary | ICD-10-CM

## 2014-11-03 DIAGNOSIS — E669 Obesity, unspecified: Secondary | ICD-10-CM

## 2014-11-03 DIAGNOSIS — H8110 Benign paroxysmal vertigo, unspecified ear: Secondary | ICD-10-CM

## 2014-11-03 DIAGNOSIS — G4733 Obstructive sleep apnea (adult) (pediatric): Secondary | ICD-10-CM

## 2014-11-03 DIAGNOSIS — F329 Major depressive disorder, single episode, unspecified: Secondary | ICD-10-CM

## 2014-11-03 DIAGNOSIS — E119 Type 2 diabetes mellitus without complications: Secondary | ICD-10-CM

## 2014-11-03 NOTE — Progress Notes (Signed)
Pre visit review using our clinic review tool, if applicable. No additional management support is needed unless otherwise documented below in the visit note. 

## 2014-11-03 NOTE — Patient Instructions (Addendum)
We will call you after I speak with Dr. Toy Care about adding cymbalta. You will be contacted about your referral for vestibular rehab and bariatric referral. Please keep your appointment for follow up sleep study. Follow up in 2 months.  Diabetes Mellitus and Food It is important for you to manage your blood sugar (glucose) level. Your blood glucose level can be greatly affected by what you eat. Eating healthier foods in the appropriate amounts throughout the day at about the same time each day will help you control your blood glucose level. It can also help slow or prevent worsening of your diabetes mellitus. Healthy eating may even help you improve the level of your blood pressure and reach or maintain a healthy weight.  HOW CAN FOOD AFFECT ME? Carbohydrates Carbohydrates affect your blood glucose level more than any other type of food. Your dietitian will help you determine how many carbohydrates to eat at each meal and teach you how to count carbohydrates. Counting carbohydrates is important to keep your blood glucose at a healthy level, especially if you are using insulin or taking certain medicines for diabetes mellitus. Alcohol Alcohol can cause sudden decreases in blood glucose (hypoglycemia), especially if you use insulin or take certain medicines for diabetes mellitus. Hypoglycemia can be a life-threatening condition. Symptoms of hypoglycemia (sleepiness, dizziness, and disorientation) are similar to symptoms of having too much alcohol.  If your health care provider has given you approval to drink alcohol, do so in moderation and use the following guidelines:  Women should not have more than one drink per day, and men should not have more than two drinks per day. One drink is equal to:  12 oz of beer.  5 oz of wine.  1 oz of hard liquor.  Do not drink on an empty stomach.  Keep yourself hydrated. Have water, diet soda, or unsweetened iced tea.  Regular soda, juice, and other mixers  might contain a lot of carbohydrates and should be counted. WHAT FOODS ARE NOT RECOMMENDED? As you make food choices, it is important to remember that all foods are not the same. Some foods have fewer nutrients per serving than other foods, even though they might have the same number of calories or carbohydrates. It is difficult to get your body what it needs when you eat foods with fewer nutrients. Examples of foods that you should avoid that are high in calories and carbohydrates but low in nutrients include:  Trans fats (most processed foods list trans fats on the Nutrition Facts label).  Regular soda.  Juice.  Candy.  Sweets, such as cake, pie, doughnuts, and cookies.  Fried foods. WHAT FOODS CAN I EAT? Have nutrient-rich foods, which will nourish your body and keep you healthy. The food you should eat also will depend on several factors, including:  The calories you need.  The medicines you take.  Your weight.  Your blood glucose level.  Your blood pressure level.  Your cholesterol level. You also should eat a variety of foods, including:  Protein, such as meat, poultry, fish, tofu, nuts, and seeds (lean animal proteins are best).  Fruits.  Vegetables.  Dairy products, such as milk, cheese, and yogurt (low fat is best).  Breads, grains, pasta, cereal, rice, and beans.  Fats such as olive oil, trans fat-free margarine, canola oil, avocado, and olives. DOES EVERYONE WITH DIABETES MELLITUS HAVE THE SAME MEAL PLAN? Because every person with diabetes mellitus is different, there is not one meal plan that works for  everyone. It is very important that you meet with a dietitian who will help you create a meal plan that is just right for you. Document Released: 05/25/2005 Document Revised: 09/02/2013 Document Reviewed: 07/25/2013 [facility] Patient Information 2015 [place], [place]. This information is not intended to replace advice given to you by your health care provider.  Make sure you discuss any questions you have with your health care provider.
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
## 2014-11-04 DIAGNOSIS — F329 Major depressive disorder, single episode, unspecified: Secondary | ICD-10-CM | POA: Insufficient documentation

## 2014-11-04 DIAGNOSIS — M797 Fibromyalgia: Secondary | ICD-10-CM | POA: Insufficient documentation

## 2014-11-04 DIAGNOSIS — F32A Depression, unspecified: Secondary | ICD-10-CM | POA: Insufficient documentation

## 2014-11-04 DIAGNOSIS — H8110 Benign paroxysmal vertigo, unspecified ear: Secondary | ICD-10-CM | POA: Insufficient documentation

## 2014-11-04 NOTE — Assessment & Plan Note (Signed)
Patient with + fatigue, multiple tender points.  Perhaps good benefit from cymbalta, however I want to discuss with her psychiatrist prior to starting. Left message for Dr. Toy Care to return my call.

## 2014-11-04 NOTE — Assessment & Plan Note (Signed)
A1C is 7.0. She is now on metformin.  Plan repeat a1c next visit.

## 2014-11-04 NOTE — Assessment & Plan Note (Signed)
Uncontrolled, follow up with psychiatry.

## 2014-11-04 NOTE — Assessment & Plan Note (Signed)
I still think that this is sub-optimally treated.  I advised her to keep her follow up sleep study.

## 2014-11-04 NOTE — Assessment & Plan Note (Signed)
Intolerant to meclizine, refer for vestibular rehab.

## 2014-11-11 ENCOUNTER — Ambulatory Visit: Payer: BLUE CROSS/BLUE SHIELD

## 2014-11-16 ENCOUNTER — Other Ambulatory Visit: Payer: Self-pay | Admitting: Family

## 2014-11-16 NOTE — Telephone Encounter (Signed)
Last filled:  07/20/14 Amt: 30, 2 refills Last OV:  11/03/14 Last Labs:  10/08/14   Med filled x 3 months.

## 2014-11-17 ENCOUNTER — Ambulatory Visit: Payer: BLUE CROSS/BLUE SHIELD | Attending: Family

## 2014-12-28 ENCOUNTER — Other Ambulatory Visit: Payer: Self-pay | Admitting: Cardiovascular Disease

## 2014-12-30 NOTE — Telephone Encounter (Signed)
This is ok. thx

## 2015-01-01 ENCOUNTER — Ambulatory Visit: Payer: BLUE CROSS/BLUE SHIELD | Admitting: Family

## 2015-01-04 ENCOUNTER — Encounter: Payer: Self-pay | Admitting: Family

## 2015-01-04 ENCOUNTER — Ambulatory Visit (INDEPENDENT_AMBULATORY_CARE_PROVIDER_SITE_OTHER): Payer: BLUE CROSS/BLUE SHIELD | Admitting: Family

## 2015-01-04 VITALS — BP 130/80 | HR 75 | Temp 98.0°F | Resp 16 | Ht 65.0 in | Wt 200.0 lb

## 2015-01-04 DIAGNOSIS — R350 Frequency of micturition: Secondary | ICD-10-CM

## 2015-01-04 DIAGNOSIS — I10 Essential (primary) hypertension: Secondary | ICD-10-CM

## 2015-01-04 DIAGNOSIS — E785 Hyperlipidemia, unspecified: Secondary | ICD-10-CM | POA: Diagnosis not present

## 2015-01-04 DIAGNOSIS — E559 Vitamin D deficiency, unspecified: Secondary | ICD-10-CM

## 2015-01-04 DIAGNOSIS — E119 Type 2 diabetes mellitus without complications: Secondary | ICD-10-CM | POA: Diagnosis not present

## 2015-01-04 DIAGNOSIS — G4733 Obstructive sleep apnea (adult) (pediatric): Secondary | ICD-10-CM

## 2015-01-04 DIAGNOSIS — M797 Fibromyalgia: Secondary | ICD-10-CM

## 2015-01-04 DIAGNOSIS — H8110 Benign paroxysmal vertigo, unspecified ear: Secondary | ICD-10-CM

## 2015-01-04 LAB — HEMOGLOBIN A1C: Hgb A1c MFr Bld: 5.6 % (ref 4.6–6.5)

## 2015-01-04 LAB — LIPID PANEL
CHOL/HDL RATIO: 5
Cholesterol: 161 mg/dL (ref 0–200)
HDL: 32.1 mg/dL — ABNORMAL LOW (ref >39.00)
LDL Cholesterol: 105 mg/dL — ABNORMAL HIGH (ref 0–99)
NonHDL: 128.9
Triglycerides: 118 mg/dL (ref 0.0–149.0)
VLDL: 23.6 mg/dL (ref 0.0–40.0)

## 2015-01-04 LAB — VITAMIN D 25 HYDROXY (VIT D DEFICIENCY, FRACTURES): VITD: 43.77 ng/mL (ref 30.00–100.00)

## 2015-01-04 NOTE — Assessment & Plan Note (Signed)
Obtain follow up vitamin D.

## 2015-01-04 NOTE — Assessment & Plan Note (Signed)
Considering oral appliance or tonsillectomy.

## 2015-01-04 NOTE — Assessment & Plan Note (Signed)
No significant change in FM pain since starting cymbalta. Continue cymbalta- she might benefit from a higher dose of cymbalta.  She has follow up with psych on Thursday and I have advised her to discuss with Dr. Toy Care.

## 2015-01-04 NOTE — Assessment & Plan Note (Signed)
Stable on current meds.  Continue same. 

## 2015-01-04 NOTE — Patient Instructions (Signed)
Please complete lab work prior to leaving. Follow up in 3 months.  

## 2015-01-04 NOTE — Progress Notes (Signed)
Pre visit review using our clinic review tool, if applicable. No additional management support is needed unless otherwise documented below in the visit note. 

## 2015-01-04 NOTE — Progress Notes (Signed)
Subjective:    Patient ID: Michele [name], female    DOB: 1971/05/19, 44 y.o.   MRN: [id number]  HPI   [name] is a 44 yr old female who presents today for follo wup.  1) BPV- reports resolution of vertigo sxs have nearly resolved after she stopped gabapentin.  2)  DM2- Pt is currently maintained on the following medications for diabetes:metformin Last A1C was:   Lab Results  Component Value Date   HGBA1C 7.0* 09/30/2014   Last diabetic eye exam was 2/16 Denies polyuria/polydipsia. Denies hypoglycemia Home glucose readings range 118-130's fasting  3) FM- now on cymbalta per psych.  She reports no improvement in Fibro pain.   4) HTN-  Patient is currently maintained on the following medications for blood pressure: losartain, aldactone, carvedilol Patient reports good compliance with blood pressure medications. Patient denies chest pain, shortness of breath or swelling. Last 3 blood pressure readings in our office are as follows:  BP Readings from Last 3 Encounters:  01/04/15 130/80  11/03/14 142/88  10/08/14 121/86   5) Vit D deficiency-was started by Dr. Benjie Karvonen, she has been taking weekly supplement x 3 months.   6) OSA- she reports that she saw Dr. Radene Knee- was told that he recommended a dental appliance due to large amount of tissue in the back of her throat. Discussed tonsilectomy    Review of Systems See HPI  Past Medical History  Diagnosis Date  . Depression   . Hypertension   . Anemia     iron deficient, microcytic, hypochromic  . Anxiety   . Migraines   . 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 Fibroids     uterine  . Palpitations     recurrent  . Fatigue   . Tachycardia     unspecified  . Chest pain     atypical  . 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 Posey Pronto at [facility]- S/p FNA March/april of two dominant nodules- benign.  Following for annual thyroid US with Dr. Posey Pronto.    . Diabetes mellitus without  complication   . Fatty liver 08/15/2013    History   Social History  . Marital Status: Married    Spouse Name: Michele [name]  . Number of Children: 2  . Years of Education: N/A   Occupational History  . 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 Smoking status: Never Smoker   . Smokeless tobacco: Never Used     Comment: never used tobacco  . Alcohol Use: Yes     Comment: Once a month  . Drug Use: No  . Sexual Activity: Yes    Birth Control/ Protection: None, Surgical   Other Topics Concern  . 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 Appendectomy  03/2009  . Tubal ligation  1997  . Tuboplasty / tubotubal anastomosis  2005  . Biopsy thyroid  November 28, 2009    Dr. Posey Pronto- endo  . Dilation and curettage of uterus  05/21/2011    Procedure: DILATATION AND CURETTAGE (D&C);  Surgeon: Elveria Royals;  Location: [facility];  Service: Gynecology;  Laterality: N/A;  dilitation and  currettage/endometrial currettings    Family History  Problem Relation Age of Onset  . Hypertension Mother   . Cancer Mother 64    breast  . Cancer Father     colon  . Stroke Brother     handicapped due to complications from spinal meningitis  . Seizures Brother   . Asthma Son   . Arthritis Maternal Grandmother   . Hypertension Maternal Grandmother   . Stroke Maternal Grandfather     Allergies  Allergen Reactions  . Amlodipine Shortness Of Breath, Anxiety, Palpitations and Other (See Comments)  . Bee Venom Anaphylaxis  . Bupropion Shortness Of Breath, Anxiety, Palpitations and Other (See Comments)  . Imitrex [Sumatriptan] Nausea Only  . Oxycodone Nausea Only    Current Outpatient Prescriptions on File Prior to Visit  Medication Sig Dispense Refill  . ALPRAZolam (XANAX) 1  MG tablet Take 1 mg by mouth as needed. For anxiety    . aspirin 81 MG tablet Take 81 mg by mouth at bedtime.     . Cholecalciferol (VITAMIN D) 2000 UNITS CAPS Take 1 capsule by mouth daily.    . cyanocobalamin (,VITAMIN B-12,) 1000 MCG/ML injection Inject 1 mL into the muscle every 30 (thirty) days.    Marland Kitchen EPIPEN 2-PAK 0.3 MG/0.3ML SOAJ injection as needed.     Marland Kitchen FLUoxetine (PROZAC) 40 MG capsule Take 40 mg by mouth every morning.    Marland Kitchen losartan (COZAAR) 100 MG tablet TAKE ONE TABLET BY MOUTH ONCE DAILY 90 tablet 0  . metFORMIN (GLUCOPHAGE) 500 MG tablet Take 500 mg by mouth 2 (two) times daily with a meal.    . polyethylene glycol (MIRALAX / GLYCOLAX) packet Take 17 g by mouth daily as needed for moderate constipation.    Marland Kitchen spironolactone (ALDACTONE) 25 MG tablet TAKE ONE TABLET BY MOUTH ONCE DAILY 30 tablet 2  . carvedilol (COREG) 3.125 MG tablet Take 3.125 mg by mouth 2 (two) times daily with a meal.    . [DISCONTINUED] buPROPion (WELLBUTRIN XL) 150 MG 24 hr tablet Take 2 tablets (300 mg total) by mouth daily. 60 tablet 1   No current facility-administered medications on file prior to visit.    BP 130/80 mmHg  Pulse 75  Temp(Src) 98 F (36.7 C) (Oral)  Resp 16  Ht 5\' 5"  (1.651 m)  Wt 200 lb (90.719 kg)  BMI 33.28 kg/m2  SpO2 97%       Objective:   Physical Exam  Constitutional: She appears well-developed and well-nourished.  Cardiovascular: Normal rate, regular rhythm and normal heart sounds.   No murmur heard. Pulmonary/Chest: Effort normal and breath sounds normal. No respiratory distress. She has no wheezes.  Psychiatric: She has a normal mood and affect. Her behavior is normal. Judgment and thought content normal.          Assessment & Plan:

## 2015-01-04 NOTE — Assessment & Plan Note (Signed)
Obtain A1C. Continue metformin. Will check urine culture due to c/o urinary frequency to rule out uti.

## 2015-01-04 NOTE — Assessment & Plan Note (Signed)
Resolved

## 2015-01-05 ENCOUNTER — Encounter: Payer: Self-pay | Admitting: Family

## 2015-01-05 LAB — URINE CULTURE

## 2015-01-06 ENCOUNTER — Encounter: Payer: Self-pay | Admitting: Family

## 2015-02-15 ENCOUNTER — Ambulatory Visit: Payer: BLUE CROSS/BLUE SHIELD | Admitting: Family

## 2015-03-24 ENCOUNTER — Other Ambulatory Visit: Payer: Self-pay | Admitting: Family

## 2015-04-05 ENCOUNTER — Telehealth: Payer: Self-pay | Admitting: Family

## 2015-04-05 ENCOUNTER — Encounter: Payer: Self-pay | Admitting: Family

## 2015-04-05 ENCOUNTER — Ambulatory Visit (INDEPENDENT_AMBULATORY_CARE_PROVIDER_SITE_OTHER): Payer: BLUE CROSS/BLUE SHIELD | Admitting: Family

## 2015-04-05 VITALS — BP 140/82 | HR 69 | Temp 97.9°F | Resp 16 | Ht 65.0 in | Wt 194.6 lb

## 2015-04-05 DIAGNOSIS — M797 Fibromyalgia: Secondary | ICD-10-CM

## 2015-04-05 DIAGNOSIS — E119 Type 2 diabetes mellitus without complications: Secondary | ICD-10-CM

## 2015-04-05 DIAGNOSIS — I10 Essential (primary) hypertension: Secondary | ICD-10-CM

## 2015-04-05 DIAGNOSIS — G4733 Obstructive sleep apnea (adult) (pediatric): Secondary | ICD-10-CM | POA: Diagnosis not present

## 2015-04-05 MED ORDER — ACCU-CHEK AVIVA DEVI: Status: DC

## 2015-04-05 MED ORDER — FUROSEMIDE 20 MG PO TABS: 20.0000 mg | ORAL_TABLET | Freq: Every day | ORAL | Status: DC

## 2015-04-05 NOTE — Assessment & Plan Note (Signed)
Add lasix for improved blood pressure and edema.  Plan bmet next visit. Continue low dose aldactone.

## 2015-04-05 NOTE — Progress Notes (Signed)
Pre visit review using our clinic review tool, if applicable. No additional management support is needed unless otherwise documented below in the visit note. 

## 2015-04-05 NOTE — Assessment & Plan Note (Signed)
I think that her FM and ongoing depression remain- major issue for her. Unfortunately she has not experienced much benefit from antidepressants including cymbalta.  She has stopped these meds and has not followed back up with psychiatry.  She seems to be functioning some better overall, she has returned to work. Monitor.

## 2015-04-05 NOTE — Patient Instructions (Signed)
Add lasix 20mg  once daily. Follow up in 2 weeks.

## 2015-04-05 NOTE — Assessment & Plan Note (Signed)
Plan A1C next visit.

## 2015-04-05 NOTE — Progress Notes (Signed)
Subjective:    Patient ID: Michele [name], female    DOB: 1970/10/13, 44 y.o.   MRN: [id number]  HPI  Michele [name] presents today for follow up of multiple medical problems.  1) HTN-  Maintained on aldactone and losartan. Reports that the carvedilol due to feeling sluggish. She reports bilateral LE edema.  BP Readings from Last 3 Encounters:  04/05/15 140/82  01/04/15 130/80  11/03/14 142/88   2) DM2- on metformin. Lab Results  Component Value Date   HGBA1C 5.6 01/04/2015   HGBA1C 7.0* 09/30/2014   HGBA1C 5.8* 05/02/2013   Lab Results  Component Value Date   LDLCALC 105* 01/04/2015   CREATININE 0.78 10/08/2014   3) OSA- No longer using CPAP.  Saw Dr. Kathline Magic- records reviewed.   4) Fibromyalgia- off of duloxetine and off of fluoxetine.  Mood is unchanged.  Notes that cymbalta intensified her hot flashes.    Review of Systems  see HPI  Past Medical History  Diagnosis Date  . Depression   . Hypertension   . Anemia     iron deficient, microcytic, hypochromic  . Anxiety   . Migraines   . 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 Fibroids     uterine  . Palpitations     recurrent  . Fatigue   . Tachycardia     unspecified  . Chest pain     atypical  . 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 Posey Pronto at [facility]- S/p FNA March/april of two dominant nodules- benign.  Following for annual thyroid US with Dr. Posey Pronto.    . Diabetes mellitus without complication   . Fatty liver 08/15/2013    History   Social History  . Marital Status: Married    Spouse Name: [name]  . Number of Children: 2  . Years of Education: N/A   Occupational History  . 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 Smoking status: Never Smoker   . Smokeless tobacco: Never Used     Comment: never used tobacco  . Alcohol Use: Yes     Comment: Once a month  . Drug Use: No  . Sexual Activity: Yes    Birth Control/ Protection: None, Surgical   Other  Topics Concern  . 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 Appendectomy  03/2009  . Tubal ligation  1997  . Tuboplasty / tubotubal anastomosis  2005  . Biopsy thyroid  November 28, 2009    Dr. Posey Pronto- endo  . Dilation and curettage of uterus  05/21/2011    Procedure: DILATATION AND CURETTAGE (D&C);  Surgeon: Elveria Royals;  Location: [place] [facility];  Service: Gynecology;  Laterality: N/A;  dilitation and currettage/endometrial currettings    Family History  Problem Relation Age of Onset  . Hypertension Mother   . Cancer Mother 9    breast  . Cancer Father     colon  . Stroke Brother     handicapped due to complications from spinal meningitis  . Seizures Brother   . Asthma Son   . Arthritis Maternal Grandmother   . Hypertension Maternal Grandmother   . Stroke Maternal Grandfather  Allergies  Allergen Reactions  . Amlodipine Shortness Of Breath, Anxiety, Palpitations and Other (See Comments)  . Bee Venom Anaphylaxis  . Bupropion Shortness Of Breath, Anxiety, Palpitations and Other (See Comments)  . Imitrex [Sumatriptan] Nausea Only  . Oxycodone Nausea Only    Current Outpatient Prescriptions on File Prior to Visit  Medication Sig Dispense Refill  . ALPRAZolam (XANAX) 1 MG tablet Take 1 mg by mouth as needed. For anxiety    . aspirin 81 MG tablet Take 81 mg by mouth at bedtime.     . cyanocobalamin (,VITAMIN B-12,) 1000 MCG/ML injection Inject 1 mL into the muscle every 30 (thirty) days.    Marland Kitchen EPIPEN 2-PAK 0.3 MG/0.3ML SOAJ injection as needed.     Marland Kitchen losartan (COZAAR) 100 MG tablet TAKE ONE TABLET BY MOUTH ONCE DAILY 90 tablet 0  . metFORMIN (GLUCOPHAGE) 500 MG tablet Take 500 mg by mouth 2 (two) times daily with a meal.    . polyethylene glycol (MIRALAX / GLYCOLAX)  packet Take 17 g by mouth daily as needed for moderate constipation.    Marland Kitchen spironolactone (ALDACTONE) 25 MG tablet TAKE ONE TABLET BY MOUTH ONCE DAILY 30 tablet 3  . Vitamin D, Ergocalciferol, (DRISDOL) 50000 UNITS CAPS capsule Take 50,000 Units by mouth once a week.  1  . [DISCONTINUED] buPROPion (WELLBUTRIN XL) 150 MG 24 hr tablet Take 2 tablets (300 mg total) by mouth daily. 60 tablet 1   No current facility-administered medications on file prior to visit.    BP 140/82 mmHg  Pulse 69  Temp(Src) 97.9 F (36.6 C) (Oral)  Resp 16  Ht 5\' 5"  (1.651 m)  Wt 194 lb 9.6 oz (88.27 kg)  BMI 32.38 kg/m2  SpO2 97%       Objective:   Physical Exam  Constitutional: She is oriented to person, place, and time. She appears well-developed and well-nourished. No distress.  Tired appearing  Cardiovascular: Normal rate and regular rhythm.   No murmur heard. Pulmonary/Chest: Effort normal and breath sounds normal. No respiratory distress. She has no wheezes. She has no rales. She exhibits no tenderness.  Neurological: She is alert and oriented to person, place, and time.  Psychiatric: She has a normal mood and affect. Her behavior is normal. Judgment and thought content normal.          Assessment & Plan:

## 2015-04-05 NOTE — Assessment & Plan Note (Signed)
Not using CPAP, advised follow up with Dr. Kathline Magic.

## 2015-04-05 NOTE — Telephone Encounter (Signed)
See mychart.  

## 2015-04-06 ENCOUNTER — Telehealth: Payer: Self-pay | Admitting: Family

## 2015-04-06 NOTE — Telephone Encounter (Signed)
Relation to pt: self  Call back number: [phone] Pharmacy: [facility] [place], [place] [phone] (Phone) [phone] (Fax)         Reason for call:  Patient requesting blood glucose strips and needles for ACCU-CHEK AVIVA Plus

## 2015-04-07 MED ORDER — GLUCOSE BLOOD VI STRP: ORAL_STRIP | Status: DC

## 2015-04-07 NOTE — Telephone Encounter (Signed)
Rx sent 

## 2015-04-19 ENCOUNTER — Encounter: Payer: Self-pay | Admitting: Family

## 2015-04-20 ENCOUNTER — Ambulatory Visit: Payer: BLUE CROSS/BLUE SHIELD | Admitting: Family

## 2015-04-26 ENCOUNTER — Ambulatory Visit: Payer: BLUE CROSS/BLUE SHIELD | Admitting: Family

## 2015-04-26 ENCOUNTER — Telehealth: Payer: Self-pay | Admitting: Family

## 2015-04-30 NOTE — Telephone Encounter (Signed)
Pt was no show 04/26/15 7:45am, follow up appt, pt called 8/17 and rescheduled for 8/31, charge for no show?

## 2015-04-30 NOTE — Telephone Encounter (Signed)
Yes please

## 2015-05-02 ENCOUNTER — Other Ambulatory Visit: Payer: Self-pay | Admitting: Cardiovascular Disease

## 2015-05-03 NOTE — Telephone Encounter (Signed)
This pt was last seen by Dr Burt Knack 08/13/2013.  We have given warnings that the pt needs to schedule appointment for further refills. Please deny refill request.  The pt has seen her PCP on a regular basis and has a pending appointment on 05/12/15. The pt can contact PCP to see if they would like to refill medications.

## 2015-05-03 NOTE — Telephone Encounter (Signed)
Please advise on refill as patient is overdue for an appointment and she has not had this refilled in over a year. Thanks, MI

## 2015-05-04 ENCOUNTER — Telehealth: Payer: Self-pay | Admitting: Family

## 2015-05-04 ENCOUNTER — Telehealth: Payer: Self-pay

## 2015-05-04 MED ORDER — LOSARTAN POTASSIUM 100 MG PO TABS: 100.0000 mg | ORAL_TABLET | Freq: Every day | ORAL | Status: DC

## 2015-05-04 NOTE — Telephone Encounter (Signed)
Pharmacy: [facility] on [address] in [place]  Reason for call: Pt has been using losartan and was prescribed by Dr. Burt Knack. Pt has not seen him in some time and doesn't have appt with cardiology for a while longer. She is out of losartan and was deferred to PCP by Dr. Burt Knack. Pt is out of meds and needs refill sent today.

## 2015-05-04 NOTE — Telephone Encounter (Signed)
Refill losartan sent to pharmacy.

## 2015-05-04 NOTE — Telephone Encounter (Signed)
Pt made aware and voices understanding.

## 2015-05-04 NOTE — Telephone Encounter (Signed)
Left message for pt to return my call. Need to advise pt that she will need to discuss with Melissa at upcoming office visit if she will be able to take over management of BP medication.

## 2015-05-04 NOTE — Telephone Encounter (Signed)
Barkley Boards, RN at 05/03/2015 12:37 PM     Status: Signed       Expand All Collapse All   This pt was last seen by Dr Burt Knack 08/13/2013. We have given warnings that the pt needs to schedule appointment for further refills. Please deny refill request. The pt has seen her PCP on a regular basis and has a pending appointment on 05/12/15. The pt can contact PCP to see if they would like to refill medications.            Pt called for refill of Losartan and I explained what was said in message from Dr Antionette Char nurse and she stated she would call her PCP and refused to make a follow up office visit at this time.

## 2015-05-12 ENCOUNTER — Ambulatory Visit: Payer: BLUE CROSS/BLUE SHIELD | Admitting: Family

## 2015-05-24 ENCOUNTER — Emergency Department (HOSPITAL_BASED_OUTPATIENT_CLINIC_OR_DEPARTMENT_OTHER): Payer: BLUE CROSS/BLUE SHIELD

## 2015-05-24 ENCOUNTER — Other Ambulatory Visit: Payer: Self-pay | Admitting: *Deleted

## 2015-05-24 ENCOUNTER — Other Ambulatory Visit: Payer: Self-pay | Admitting: Family

## 2015-05-24 ENCOUNTER — Encounter (HOSPITAL_BASED_OUTPATIENT_CLINIC_OR_DEPARTMENT_OTHER): Payer: Self-pay | Admitting: Emergency Medicine

## 2015-05-24 ENCOUNTER — Emergency Department (HOSPITAL_BASED_OUTPATIENT_CLINIC_OR_DEPARTMENT_OTHER)
Admission: EM | Admit: 2015-05-24 | Discharge: 2015-05-24 | Disposition: A | Discharge status: Home / Self Care | Payer: BLUE CROSS/BLUE SHIELD | Attending: Emergency Medicine | Admitting: Emergency Medicine

## 2015-05-24 DIAGNOSIS — D509 Iron deficiency anemia, unspecified: Secondary | ICD-10-CM

## 2015-05-24 DIAGNOSIS — I10 Essential (primary) hypertension: Secondary | ICD-10-CM | POA: Insufficient documentation

## 2015-05-24 DIAGNOSIS — R079 Chest pain, unspecified: Secondary | ICD-10-CM | POA: Insufficient documentation

## 2015-05-24 DIAGNOSIS — E119 Type 2 diabetes mellitus without complications: Secondary | ICD-10-CM | POA: Insufficient documentation

## 2015-05-24 DIAGNOSIS — Z86018 Personal history of other benign neoplasm: Secondary | ICD-10-CM | POA: Insufficient documentation

## 2015-05-24 DIAGNOSIS — F329 Major depressive disorder, single episode, unspecified: Secondary | ICD-10-CM | POA: Insufficient documentation

## 2015-05-24 DIAGNOSIS — Z7982 Long term (current) use of aspirin: Secondary | ICD-10-CM | POA: Diagnosis not present

## 2015-05-24 DIAGNOSIS — F419 Anxiety disorder, unspecified: Secondary | ICD-10-CM | POA: Diagnosis not present

## 2015-05-24 DIAGNOSIS — Z862 Personal history of diseases of the blood and blood-forming organs and certain disorders involving the immune mechanism: Secondary | ICD-10-CM | POA: Diagnosis not present

## 2015-05-24 DIAGNOSIS — Z79899 Other long term (current) drug therapy: Secondary | ICD-10-CM | POA: Insufficient documentation

## 2015-05-24 LAB — BASIC METABOLIC PANEL
ANION GAP: 8 (ref 5–15)
BUN: 10 mg/dL (ref 6–20)
CALCIUM: 9.4 mg/dL (ref 8.9–10.3)
CO2: 25 mmol/L (ref 22–32)
CREATININE: 0.69 mg/dL (ref 0.44–1.00)
Chloride: 109 mmol/L (ref 101–111)
GFR calc Af Amer: >60 mL/min (ref >60)
GFR calc non Af Amer: >60 mL/min (ref >60)
GLUCOSE: 101 mg/dL — AB (ref 65–99)
Potassium: 4.1 mmol/L (ref 3.5–5.1)
Sodium: 142 mmol/L (ref 135–145)

## 2015-05-24 LAB — CBC WITH DIFFERENTIAL/PLATELET
Basophils Absolute: 0 10*3/uL (ref 0.0–0.1)
Basophils Relative: 0 % (ref 0–1)
EOS ABS: 0.1 10*3/uL (ref 0.0–0.7)
Eosinophils Relative: 2 % (ref 0–5)
HEMATOCRIT: 39.7 % (ref 36.0–46.0)
Hemoglobin: 13.4 g/dL (ref 12.0–15.0)
LYMPHS ABS: 2.2 10*3/uL (ref 0.7–4.0)
Lymphocytes Relative: 29 % (ref 12–46)
MCH: 28.5 pg (ref 26.0–34.0)
MCHC: 33.8 g/dL (ref 30.0–36.0)
MCV: 84.3 fL (ref 78.0–100.0)
Monocytes Absolute: 0.5 10*3/uL (ref 0.1–1.0)
Monocytes Relative: 6 % (ref 3–12)
NEUTROS PCT: 63 % (ref 43–77)
Neutro Abs: 4.7 10*3/uL (ref 1.7–7.7)
PLATELETS: 210 10*3/uL (ref 150–400)
RBC: 4.71 MIL/uL (ref 3.87–5.11)
RDW: 13.4 % (ref 11.5–15.5)
WBC: 7.4 10*3/uL (ref 4.0–10.5)

## 2015-05-24 LAB — TROPONIN I: Troponin I: <0.03 ng/mL (ref <0.031)

## 2015-05-24 MED ORDER — ASPIRIN 81 MG PO CHEW
324.0000 mg | CHEWABLE_TABLET | Freq: Once | ORAL | Status: AC
  Administered 2015-05-24: 324 mg via ORAL
  Filled 2015-05-24: qty 4

## 2015-05-24 NOTE — ED Notes (Signed)
Chest pain

## 2015-05-24 NOTE — ED Notes (Signed)
Pt reports chest pain since 0845 yesterday. Reports she was too lightheaded to drive yesterday so she stayed home. Still felt bad this morning so came to ED. Describes pain in left chest as "fullness" and "tight". Reports she felt clammy and diaphoretic yesterday.

## 2015-05-24 NOTE — Discharge Instructions (Signed)
Chest Pain (Nonspecific) Keep your follow up appointment tomorrow with your primary care physician.  Return for worsening chest pain, shortness of breath, or sweating.  It is often hard to give a specific diagnosis for the cause of chest pain. There is always a chance that your pain could be related to something serious, such as a heart attack or a blood clot in the lungs. You need to follow up with your health care provider for further evaluation. CAUSES   Heartburn.  Pneumonia or bronchitis.  Anxiety or stress.  Inflammation around your heart (pericarditis) or lung (pleuritis or pleurisy).  A blood clot in the lung.  A collapsed lung (pneumothorax). It can develop suddenly on its own (spontaneous pneumothorax) or from trauma to the chest.  Shingles infection (herpes zoster virus). The chest wall is composed of bones, muscles, and cartilage. Any of these can be the source of the pain.  The bones can be bruised by injury.  The muscles or cartilage can be strained by coughing or overwork.  The cartilage can be affected by inflammation and become sore (costochondritis). DIAGNOSIS  Lab tests or other studies may be needed to find the cause of your pain. Your health care provider may have you take a test called an ambulatory electrocardiogram (ECG). An ECG records your heartbeat patterns over a 24-hour period. You may also have other tests, such as:  Transthoracic echocardiogram (TTE). During echocardiography, sound waves are used to evaluate how blood flows through your heart.  Transesophageal echocardiogram (TEE).  Cardiac monitoring. This allows your health care provider to monitor your heart rate and rhythm in real time.  Holter monitor. This is a portable device that records your heartbeat and can help diagnose heart arrhythmias. It allows your health care provider to track your heart activity for several days, if needed.  Stress tests by exercise or by giving medicine that makes  the heart beat faster. TREATMENT   Treatment depends on what may be causing your chest pain. Treatment may include:  Acid blockers for heartburn.  Anti-inflammatory medicine.  Pain medicine for inflammatory conditions.  Antibiotics if an infection is present.  You may be advised to change lifestyle habits. This includes stopping smoking and avoiding alcohol, caffeine, and chocolate.  You may be advised to keep your head raised (elevated) when sleeping. This reduces the chance of acid going backward from your stomach into your esophagus. Most of the time, nonspecific chest pain will improve within 2-3 days with rest and mild pain medicine.  HOME CARE INSTRUCTIONS   If antibiotics were prescribed, take them as directed. Finish them even if you start to feel better.  For the next few days, avoid physical activities that bring on chest pain. Continue physical activities as directed.  Do not use any tobacco products, including cigarettes, chewing tobacco, or electronic cigarettes.  Avoid drinking alcohol.  Only take medicine as directed by your health care provider.  Follow your health care provider's suggestions for further testing if your chest pain does not go away.  Keep any follow-up appointments you made. If you do not go to an appointment, you could develop lasting (chronic) problems with pain. If there is any problem keeping an appointment, call to reschedule. SEEK MEDICAL CARE IF:   Your chest pain does not go away, even after treatment.  You have a rash with blisters on your chest.  You have a fever. SEEK IMMEDIATE MEDICAL CARE IF:   You have increased chest pain or pain that spreads  to your arm, neck, jaw, back, or abdomen.  You have shortness of breath.  You have an increasing cough, or you cough up blood.  You have severe back or abdominal pain.  You feel nauseous or vomit.  You have severe weakness.  You faint.  You have chills. This is an emergency.  Do not wait to see if the pain will go away. Get medical help at once. Call your local emergency services (911 in [place] Do not drive yourself to the hospital. MAKE SURE YOU:   Understand these instructions.  Will watch your condition.  Will get help right away if you are not doing well or get worse. Document Released: 06/07/2005 Document Revised: 09/02/2013 Document Reviewed: 04/02/2008 [facility] Patient Information 2015 [place], [place]. This information is not intended to replace advice given to you by your health care provider. Make sure you discuss any questions you have with your health care provider.

## 2015-05-24 NOTE — ED Notes (Signed)
Patient transported to X-ray 

## 2015-05-24 NOTE — ED Notes (Signed)
PA at bedside.

## 2015-05-24 NOTE — ED Provider Notes (Signed)
CSN: [id number]     Arrival date & time 05/24/15  9233 History   First MD Initiated Contact with Patient 05/24/15 [phone]     Chief Complaint  Patient presents with  . Chest Pain     (Consider location/radiation/quality/duration/timing/severity/associated sxs/prior Treatment) Patient is a 44 y.o. female presenting with chest pain. The history is provided by the patient and a relative. No language interpreter was used.  Chest Pain Associated symptoms: no abdominal pain, no cough, no dizziness, no nausea, no shortness of breath and not vomiting   [name] is a 44 y.o female with a history of hypertension, anemia, anxiety, palpitations and on unspecified tachycardia, and diabetes who presents for intermittent chest pain and clamminess since 8:45 AM yesterday. She took a few 81 mg aspirin yesterday after the chest pain began which helped somewhat. She describes the pain as tightness, "as though a rubber band is about to pop," on the left side of her chest. She also reports chronic fatigue, especially after working. Pain is 7/10 now. She took ibuprofen 800mg  3 hours ago. She denies any fever, chills, shortness of breath, abdominal pain, nausea, vomiting, diarrhea, leg swelling. She states her grandmother had an MI but otherwise no family cardiac history. She denies a smoking history.  Past Medical History  Diagnosis Date  . Depression   . Hypertension   . Anemia     iron deficient, microcytic, hypochromic  . Anxiety   . Migraines   . 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 Fibroids     uterine  . Palpitations     recurrent  . Fatigue   . Tachycardia     unspecified  . Chest pain     atypical  . 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 Posey Pronto at [facility]- S/p FNA March/april of two dominant nodules- benign.  Following for annual thyroid US with Dr. Posey Pronto.    . Diabetes mellitus without complication   . Fatty liver 08/15/2013   Past Surgical History   Procedure Laterality Date  . Appendectomy  03/2009  . Tubal ligation  1997  . Tuboplasty / tubotubal anastomosis  2005  . Biopsy thyroid  November 28, 2009    Dr. Posey Pronto- endo  . Dilation and curettage of uterus  05/21/2011    Procedure: DILATATION AND CURETTAGE (D&C);  Surgeon: Elveria Royals;  Location: [facility];  Service: Gynecology;  Laterality: N/A;  dilitation and currettage/endometrial currettings   Family History  Problem Relation Age of Onset  . Hypertension Mother   . Cancer Mother 44    breast  . Cancer Father     colon  . Stroke Brother     handicapped due to complications from spinal meningitis  . Seizures Brother   . Asthma Son   . Arthritis Maternal Grandmother   . Hypertension Maternal Grandmother   . Stroke Maternal Grandfather    Social History  Substance Use Topics  . Smoking status: Never Smoker   . Smokeless tobacco: Never Used     Comment: never used tobacco  . Alcohol Use: Yes     Comment: Once a month   OB History    Gravida Para Term Preterm AB TAB SAB Ectopic Multiple Living   4 2 1 1 2 1  1  2      Review of Systems  Respiratory: Negative for cough and shortness of breath.   Cardiovascular: Positive for chest pain.  Gastrointestinal: Negative for nausea, vomiting  and abdominal pain.  Neurological: Negative for dizziness.  All other systems reviewed and are negative.     Allergies  Amlodipine; Bee venom; Bupropion; Imitrex; and Oxycodone  Home Medications   Prior to Admission medications   Medication Sig Start Date End Date Taking? Authorizing Provider  verapamil (CALAN) 40 MG tablet Take 40 mg by mouth 3 (three) times daily.   Yes Historical Provider, MD  ALPRAZolam Duanne Moron) 1 MG tablet Take 1 mg by mouth as needed. For anxiety    Historical Provider, MD  aspirin 81 MG tablet Take 81 mg by mouth at bedtime.     Historical Provider, MD  Blood Glucose Monitoring Suppl (ACCU-CHEK AVIVA) device Use as instructed 04/05/15   Debbrah Alar, NP   cyanocobalamin (,VITAMIN B-12,) 1000 MCG/ML injection Inject 1 mL into the muscle every 30 (thirty) days. 04/15/14   Historical Provider, MD  EPIPEN 2-PAK 0.3 MG/0.3ML SOAJ injection as needed.  05/14/13   Historical Provider, MD  furosemide (LASIX) 20 MG tablet Take 1 tablet (20 mg total) by mouth daily. 04/05/15   Debbrah Alar, NP  glucose blood (ACCU-CHEK AVIVA PLUS) test strip Use as instructed to check blood sugar once a day.  Dx  E11.9 04/07/15   Debbrah Alar, NP  losartan (COZAAR) 100 MG tablet Take 1 tablet (100 mg total) by mouth daily. 05/04/15   Debbrah Alar, NP  metFORMIN (GLUCOPHAGE) 500 MG tablet Take 500 mg by mouth 2 (two) times daily with a meal.    Historical Provider, MD  polyethylene glycol (MIRALAX / GLYCOLAX) packet Take 17 g by mouth daily as needed for moderate constipation.    Historical Provider, MD  spironolactone (ALDACTONE) 25 MG tablet TAKE ONE TABLET BY MOUTH ONCE DAILY 03/24/15   Debbrah Alar, NP  Vitamin D, Ergocalciferol, (DRISDOL) 50000 UNITS CAPS capsule Take 50,000 Units by mouth once a week. 12/28/14   Historical Provider, MD   BP 118/69 mmHg  Pulse 79  Temp(Src) 98.4 F (36.9 C) (Oral)  Resp 19  Ht 5\' 4"  (1.626 m)  Wt 190 lb (86.183 kg)  BMI 32.60 kg/m2  SpO2 98%  LMP 06/11/2013 Physical Exam  Constitutional: She is oriented to person, place, and time. She appears well-developed and well-nourished.  HENT:  Head: Normocephalic and atraumatic.  Eyes: Conjunctivae are normal.  Neck: Normal range of motion. Neck supple.  Cardiovascular: Normal rate, regular rhythm and normal heart sounds.   Pulmonary/Chest: Effort normal and breath sounds normal. No respiratory distress. She has no wheezes. She has no rales. She exhibits no tenderness.  Lungs clear to auscultation bilaterally. No wheezing or decreased breath sounds. Minimal chest tenderness below the left breast. No deformity or rash.  Abdominal: Soft. There is no tenderness.   Musculoskeletal: Normal range of motion.  No lower extremity edema. 2+ DP pulses bilaterally.  Neurological: She is alert and oriented to person, place, and time.  Skin: Skin is warm and dry.  Psychiatric: She has a normal mood and affect. Her behavior is normal.  Nursing note and vitals reviewed.   ED Course  Procedures (including critical care time) Labs Review Labs Reviewed  BASIC METABOLIC PANEL - Abnormal; Notable for the following:    Glucose, Bld 101 (*)    All other components within normal limits  TROPONIN I  CBC WITH DIFFERENTIAL/PLATELET  TROPONIN I    Imaging Review Dg Chest 2 View  05/24/2015   CLINICAL DATA:  Chest pain starting yesterday  EXAM: CHEST  2 VIEW  COMPARISON:  April 29, 2014  FINDINGS: The heart size and mediastinal contours are within normal limits. There is no focal infiltrate, pulmonary edema, or pleural effusion. The visualized skeletal structures are unremarkable.  IMPRESSION: No active cardiopulmonary disease.   Electronically Signed   By: Abelardo Diesel M.D.   On: 05/24/2015 10:44   I have personally reviewed and evaluated these images and lab results as part of my medical decision-making.   EKG Interpretation   Date/Time:  Monday May 24 2015 09:44:45 EDT Ventricular Rate:  89 PR Interval:  132 QRS Duration: 80 QT Interval:  350 QTC Calculation: 425 R Axis:   44 Text Interpretation:  Normal sinus rhythm Normal ECG Confirmed by [name]   MD, [place] ([id number]) on 05/24/2015 10:48:56 AM      MDM   Final diagnoses:  Chest pain, unspecified chest pain type   Patient presents for intermittent chest pain that began yesterday. Vitals signs are normal and she is well-appearing. She has a long history of chest pain that has been intermittent for the past 2 years. She has had a thorough workup at both [facility] and [facility]. She had an echocardiogram done in January of this year which was normal with an ejection fraction greater than 55%. She takes 81 mg of  aspirin daily but has not taken it today.  She had ibuprofen 3 hours PTA. I reviewed the heart score and she has 2 points which is low risk for major cardiac event. She is PERC negative. Medications  aspirin chewable tablet 324 mg (324 mg Oral Given 05/24/15 1045)   Her labs are unremarkable. Chest x-ray is negative for pneumonia or edema. Troponin is negative. Her EKG is normal.  Recheck: I discussed findings with the patient. She states that she has a follow-up appointment tomorrow with her pcp for further evaluation. Repeat troponin negative. I gave the patient return precautions and she verbally agrees with the plan.    Ottie Glazier, PA-C 05/24/15 [place], MD 05/29/15 [phone]

## 2015-05-25 ENCOUNTER — Encounter: Payer: Self-pay | Admitting: Family

## 2015-05-25 ENCOUNTER — Ambulatory Visit (INDEPENDENT_AMBULATORY_CARE_PROVIDER_SITE_OTHER): Payer: BLUE CROSS/BLUE SHIELD | Admitting: Family

## 2015-05-25 ENCOUNTER — Other Ambulatory Visit (HOSPITAL_BASED_OUTPATIENT_CLINIC_OR_DEPARTMENT_OTHER): Payer: BLUE CROSS/BLUE SHIELD

## 2015-05-25 VITALS — BP 132/92 | HR 81 | Temp 98.3°F | Resp 16 | Ht 65.0 in | Wt 191.0 lb

## 2015-05-25 DIAGNOSIS — R5383 Other fatigue: Secondary | ICD-10-CM

## 2015-05-25 DIAGNOSIS — R82998 Other abnormal findings in urine: Secondary | ICD-10-CM

## 2015-05-25 DIAGNOSIS — E119 Type 2 diabetes mellitus without complications: Secondary | ICD-10-CM | POA: Diagnosis not present

## 2015-05-25 DIAGNOSIS — I10 Essential (primary) hypertension: Secondary | ICD-10-CM | POA: Diagnosis not present

## 2015-05-25 DIAGNOSIS — E785 Hyperlipidemia, unspecified: Secondary | ICD-10-CM | POA: Diagnosis not present

## 2015-05-25 DIAGNOSIS — D509 Iron deficiency anemia, unspecified: Secondary | ICD-10-CM | POA: Diagnosis not present

## 2015-05-25 DIAGNOSIS — R8299 Other abnormal findings in urine: Secondary | ICD-10-CM | POA: Diagnosis not present

## 2015-05-25 LAB — COMPREHENSIVE METABOLIC PANEL
ALBUMIN: 4.6 g/dL (ref 3.6–5.1)
ALT: 22 U/L (ref 6–29)
AST: 17 U/L (ref 10–35)
Alkaline Phosphatase: 70 U/L (ref 33–115)
BUN: 11 mg/dL (ref 7–25)
CALCIUM: 10 mg/dL (ref 8.6–10.2)
CHLORIDE: 108 mmol/L (ref 98–110)
CO2: 26 mmol/L (ref 20–31)
CREATININE: 0.75 mg/dL (ref 0.50–1.10)
Glucose, Bld: 92 mg/dL (ref 65–99)
POTASSIUM: 4.6 mmol/L (ref 3.5–5.3)
SODIUM: 146 mmol/L (ref 135–146)
TOTAL PROTEIN: 7.3 g/dL (ref 6.1–8.1)
Total Bilirubin: 1 mg/dL (ref 0.2–1.2)

## 2015-05-25 LAB — CBC WITH DIFFERENTIAL (CANCER CENTER ONLY)
BASO#: 0 10*3/uL (ref 0.0–0.2)
BASO%: 0.1 % (ref 0.0–2.0)
EOS ABS: 0.2 10*3/uL (ref 0.0–0.5)
EOS%: 2.4 % (ref 0.0–7.0)
HEMATOCRIT: 40.3 % (ref 34.8–46.6)
HEMOGLOBIN: 13.8 g/dL (ref 11.6–15.9)
LYMPH#: 2.4 10*3/uL (ref 0.9–3.3)
LYMPH%: 35.7 % (ref 14.0–48.0)
MCH: 29.1 pg (ref 26.0–34.0)
MCHC: 34.2 g/dL (ref 32.0–36.0)
MCV: 85 fL (ref 81–101)
MONO#: 0.4 10*3/uL (ref 0.1–0.9)
MONO%: 6.2 % (ref 0.0–13.0)
NEUT%: 55.6 % (ref 39.6–80.0)
NEUTROS ABS: 3.8 10*3/uL (ref 1.5–6.5)
Platelets: 207 10*3/uL (ref 145–400)
RBC: 4.74 10*6/uL (ref 3.70–5.32)
RDW: 13.3 % (ref 11.1–15.7)
WBC: 6.8 10*3/uL (ref 3.9–10.0)

## 2015-05-25 LAB — BASIC METABOLIC PANEL
BUN: 11 mg/dL (ref 6–23)
CHLORIDE: 106 meq/L (ref 96–112)
CO2: 26 mEq/L (ref 19–32)
Calcium: 9.7 mg/dL (ref 8.4–10.5)
Creatinine, Ser: 0.71 mg/dL (ref 0.40–1.20)
GFR: 114.14 mL/min (ref >60.00)
Glucose, Bld: 84 mg/dL (ref 70–99)
POTASSIUM: 4 meq/L (ref 3.5–5.1)
Sodium: 142 mEq/L (ref 135–145)

## 2015-05-25 LAB — HEMOGLOBIN A1C: HEMOGLOBIN A1C: 5.5 % (ref 4.6–6.5)

## 2015-05-25 LAB — URINALYSIS, ROUTINE W REFLEX MICROSCOPIC
Bilirubin Urine: NEGATIVE
KETONES UR: NEGATIVE
Leukocytes, UA: NEGATIVE
NITRITE: NEGATIVE
TOTAL PROTEIN, URINE-UPE24: NEGATIVE
URINE GLUCOSE: NEGATIVE
UROBILINOGEN UA: 0.2 (ref 0.0–1.0)
WBC UA: NONE SEEN (ref 0–?)
pH: 5.5 (ref 5.0–8.0)

## 2015-05-25 LAB — LIPID PANEL
CHOL/HDL RATIO: 4
Cholesterol: 171 mg/dL (ref 0–200)
HDL: 38.2 mg/dL — ABNORMAL LOW (ref >39.00)
LDL Cholesterol: 112 mg/dL — ABNORMAL HIGH (ref 0–99)
NONHDL: 132.48
Triglycerides: 102 mg/dL (ref 0.0–149.0)
VLDL: 20.4 mg/dL (ref 0.0–40.0)

## 2015-05-25 LAB — IRON AND TIBC CHCC
%SAT: 29 % (ref 21–57)
IRON: 81 ug/dL (ref 41–142)
TIBC: 279 ug/dL (ref 236–444)
UIBC: 198 ug/dL (ref 120–384)

## 2015-05-25 LAB — MICROALBUMIN / CREATININE URINE RATIO
Creatinine,U: 159.1 mg/dL
Microalb Creat Ratio: 0.4 mg/g (ref 0.0–30.0)

## 2015-05-25 LAB — TSH CHCC: TSH: 0.906 m[IU]/L (ref 0.308–3.960)

## 2015-05-25 LAB — FERRITIN CHCC: Ferritin: 325 ng/ml — ABNORMAL HIGH (ref 9–269)

## 2015-05-25 LAB — CHCC SATELLITE - SMEAR

## 2015-05-25 MED ORDER — VERAPAMIL HCL ER 120 MG PO CP24: 120.0000 mg | ORAL_CAPSULE | Freq: Every day | ORAL | Status: DC

## 2015-05-25 NOTE — Progress Notes (Signed)
Pre visit review using our clinic review tool, if applicable. No additional management support is needed unless otherwise documented below in the visit note. 

## 2015-05-25 NOTE — Progress Notes (Signed)
Subjective:    Patient ID: Michele [name], female    DOB: 24-May-1971, 44 y.o.   MRN: [id number]  HPI  Pt presents today for follow up.  Atypical CP- long standing hx. Has also had extensive work up- negative for cardiac cause.  Reports that the pain is like a cramp beneath her left breast.  Worse with deep breath. CXR negative. She saw Dr. Einar Gip- 1 month ago. He referred her to ENT for tonsillectomy consult. Dr. Melene Plan advised her against tonsillectomy  HTN- on verapamil 120mg  once daily.  BP Readings from Last 3 Encounters:  05/25/15 132/92  05/24/15 124/90  04/05/15 140/82   Reports dark urine.   DM2-   Lab Results  Component Value Date   HGBA1C 5.6 01/04/2015   HGBA1C 7.0* 09/30/2014   HGBA1C 5.8* 05/02/2013   Lab Results  Component Value Date   LDLCALC 105* 01/04/2015   CREATININE 0.69 05/24/2015     Review of Systems See HPI  Past Medical History  Diagnosis Date  . Depression   . Hypertension   . Anemia     iron deficient, microcytic, hypochromic  . Anxiety   . Migraines   . 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 Fibroids     uterine  . Palpitations     recurrent  . Fatigue   . Tachycardia     unspecified  . Chest pain     atypical  . 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 Posey Pronto at [facility]- S/p FNA March/april of two dominant nodules- benign.  Following for annual thyroid US with Dr. Posey Pronto.    . Diabetes mellitus without complication   . Fatty liver 08/15/2013    Social History   Social History  . Marital Status: Married    Spouse Name: [name]  . Number of Children: 2  . Years of Education: N/A   Occupational History  . 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 Smoking status: Never Smoker   . Smokeless tobacco: Never Used     Comment: never used tobacco  . Alcohol Use: Yes     Comment: Once a month  . Drug Use: No  . Sexual Activity: Yes    Birth Control/ Protection: None, Surgical    Other Topics Concern  . 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 Appendectomy  03/2009  . Tubal ligation  1997  . Tuboplasty / tubotubal anastomosis  2005  . Biopsy thyroid  November 28, 2009    Dr. Posey Pronto- endo  . Dilation and curettage of uterus  05/21/2011    Procedure: DILATATION AND CURETTAGE (D&C);  Surgeon: Elveria Royals;  Location: [facility];  Service: Gynecology;  Laterality: N/A;  dilitation and currettage/endometrial currettings    Family History  Problem Relation Age of Onset  . Hypertension Mother   . Cancer Mother 32    breast  . Cancer Father     colon  . Stroke Brother     handicapped due to complications from spinal meningitis  . Seizures Brother   . Asthma Son   . Arthritis Maternal Grandmother   . Hypertension Maternal Grandmother   .  Stroke Maternal Grandfather     Allergies  Allergen Reactions  . Amlodipine Shortness Of Breath, Anxiety, Palpitations and Other (See Comments)  . Bee Venom Anaphylaxis  . Bupropion Shortness Of Breath, Anxiety, Palpitations and Other (See Comments)  . Imitrex [Sumatriptan] Nausea Only  . Oxycodone Nausea Only    Current Outpatient Prescriptions on File Prior to Visit  Medication Sig Dispense Refill  . ALPRAZolam (XANAX) 1 MG tablet Take 1 mg by mouth as needed. For anxiety    . aspirin 81 MG tablet Take 81 mg by mouth at bedtime.     . Blood Glucose Monitoring Suppl (ACCU-CHEK AVIVA) device Use as instructed 1 each 0  . cyanocobalamin (,VITAMIN B-12,) 1000 MCG/ML injection Inject 1 mL into the muscle every 30 (thirty) days.    Marland Kitchen EPIPEN 2-PAK 0.3 MG/0.3ML SOAJ injection as needed.     . furosemide (LASIX) 20 MG tablet Take 1 tablet (20 mg total) by mouth daily. 30 tablet 3  . glucose blood (ACCU-CHEK AVIVA  PLUS) test strip Use as instructed to check blood sugar once a day.  Dx  E11.9 100 each 1  . losartan (COZAAR) 100 MG tablet Take 1 tablet (100 mg total) by mouth daily. 90 tablet 0  . metFORMIN (GLUCOPHAGE) 500 MG tablet Take 500 mg by mouth 2 (two) times daily with a meal.    . polyethylene glycol (MIRALAX / GLYCOLAX) packet Take 17 g by mouth daily as needed for moderate constipation.    Marland Kitchen spironolactone (ALDACTONE) 25 MG tablet TAKE ONE TABLET BY MOUTH ONCE DAILY 30 tablet 3  . Vitamin D, Ergocalciferol, (DRISDOL) 50000 UNITS CAPS capsule Take 50,000 Units by mouth once a week.  1  . [DISCONTINUED] buPROPion (WELLBUTRIN XL) 150 MG 24 hr tablet Take 2 tablets (300 mg total) by mouth daily. 60 tablet 1   No current facility-administered medications on file prior to visit.    BP 132/92 mmHg  Pulse 81  Temp(Src) 98.3 F (36.8 C) (Oral)  Resp 16  Ht 5\' 5"  (1.651 m)  Wt 191 lb (86.637 kg)  BMI 31.78 kg/m2  SpO2 98%  LMP 06/11/2013       Objective:   Physical Exam  Constitutional: She is oriented to person, place, and time. She appears well-developed and well-nourished.  HENT:  Head: Normocephalic and atraumatic.  Cardiovascular: Normal rate, regular rhythm and normal heart sounds.   No murmur heard. Pulmonary/Chest: Effort normal and breath sounds normal. No respiratory distress. She has no wheezes.  Musculoskeletal: She exhibits no edema.  Neurological: She is alert and oriented to person, place, and time.  Skin: Skin is warm and dry.  Psychiatric: She has a normal mood and affect. Her behavior is normal. Judgment and thought content normal.          Assessment & Plan:  Atypical CP- non-cardiac.  Suspect related to fibromyalgia and anxiety.  Monitor.   Dark urine- UA neg for WBC, neg for bilirubin. Likely needs to drink more water.

## 2015-05-25 NOTE — Patient Instructions (Signed)
Please complete lab work prior to leaving. Let me know if your blood pressure remains <140/90 consistently and we may consider trial of nuvigil. Follow up in 3 months.

## 2015-05-26 ENCOUNTER — Ambulatory Visit (HOSPITAL_BASED_OUTPATIENT_CLINIC_OR_DEPARTMENT_OTHER): Payer: BLUE CROSS/BLUE SHIELD | Admitting: Family

## 2015-05-26 ENCOUNTER — Ambulatory Visit: Payer: BLUE CROSS/BLUE SHIELD

## 2015-05-26 ENCOUNTER — Encounter: Payer: Self-pay | Admitting: Family

## 2015-05-26 VITALS — BP 138/100 | HR 75 | Temp 97.7°F | Resp 18 | Wt 193.0 lb

## 2015-05-26 DIAGNOSIS — D510 Vitamin B12 deficiency anemia due to intrinsic factor deficiency: Secondary | ICD-10-CM | POA: Diagnosis not present

## 2015-05-26 NOTE — Progress Notes (Signed)
Hematology and Oncology Follow Up Visit  ANABETH CHILCOTT [id number] 28-Jul-1971 44 y.o. 05/26/2015   Principle Diagnosis:  Iron deficiency anemia   Current Therapy:   IV iron as indicated    Interim History:  [name] is here today for a follow-up. She is feeling fatigued and wanted to have her iron studies checked. She was recently in the ED for chest pain and has had some dizziness. Her work-up was negative. She does have an appointment with cardiology next week.  She still has fatigue at times. Her monthly cycles have stopped. She has had no episodes of bleeding, bruising or petechiae. She does have hot flashes at times.  No issues with infection. No fever, chills, n/v, cough, rash, SOB, palpitations, abdominal pain, changes in bowel or bladder habits. She denies having noticed any blood in her urine or stool.  No numbness or tingling in her extremities.  She has a good appetite and is staying hydrated. Her weight is stable.   Medications:    Medication List       This list is accurate as of: 05/26/15  2:34 PM.  Always use your most recent med list.               ACCU-CHEK AVIVA device  Use as instructed     ALPRAZolam 1 MG tablet  Commonly known as:  XANAX  Take 1 mg by mouth as needed. For anxiety     amoxicillin 500 MG capsule  Commonly known as:  AMOXIL  Take 500 mg by mouth 3 (three) times daily.     aspirin 81 MG tablet  Take 81 mg by mouth at bedtime.     cyanocobalamin 1000 MCG/ML injection  Commonly known as:  (VITAMIN B-12)  Inject 1 mL into the muscle every 30 (thirty) days.     EPIPEN 2-PAK 0.3 mg/0.3 mL Soaj injection  Generic drug:  EPINEPHrine  as needed.     furosemide 20 MG tablet  Commonly known as:  LASIX  Take 1 tablet (20 mg total) by mouth daily.     glucose blood test strip  Commonly known as:  ACCU-CHEK AVIVA PLUS  Use as instructed to check blood sugar once a day.  Dx  E11.9     ibuprofen 800 MG tablet    Commonly known as:  ADVIL,MOTRIN  Take 800 mg by mouth 3 (three) times daily.     losartan 100 MG tablet  Commonly known as:  COZAAR  Take 1 tablet (100 mg total) by mouth daily.     metFORMIN 500 MG tablet  Commonly known as:  GLUCOPHAGE  Take 500 mg by mouth 2 (two) times daily with a meal.     polyethylene glycol packet  Commonly known as:  MIRALAX / GLYCOLAX  Take 17 g by mouth daily as needed for moderate constipation.     spironolactone 25 MG tablet  Commonly known as:  ALDACTONE  TAKE ONE TABLET BY MOUTH ONCE DAILY     verapamil 120 MG 24 hr capsule  Commonly known as:  VERELAN PM  Take 1 capsule (120 mg total) by mouth at bedtime.     Vitamin D (Ergocalciferol) 50000 UNITS Caps capsule  Commonly known as:  DRISDOL  Take 50,000 Units by mouth once a week.        Allergies:  Allergies  Allergen Reactions  . Amlodipine Shortness Of Breath, Anxiety, Palpitations and Other (See Comments)  . Bee Venom Anaphylaxis  . Bupropion Shortness  Of Breath, Anxiety, Palpitations and Other (See Comments)  . Imitrex [Sumatriptan] Nausea Only  . Oxycodone Nausea Only    Past Medical History, Surgical history, Social history, and Family History were reviewed and updated.  Review of Systems: All other 10 point review of systems is negative.   Physical Exam:  weight is 193 lb (87.544 kg). Her oral temperature is 97.7 F (36.5 C). Her blood pressure is 138/100 and her pulse is 75. Her respiration is 18.   Wt Readings from Last 3 Encounters:  05/26/15 193 lb (87.544 kg)  05/25/15 191 lb (86.637 kg)  05/24/15 190 lb (86.183 kg)    Ocular: Sclerae unicteric, pupils equal, round and reactive to light Ear-nose-throat: Oropharynx clear, dentition fair Lymphatic: No cervical or supraclavicular adenopathy Lungs no rales or rhonchi, good excursion bilaterally Heart regular rate and rhythm, no murmur appreciated Abd soft, nontender, positive bowel sounds MSK no focal spinal  tenderness, no joint edema Neuro: non-focal, well-oriented, appropriate affect Breasts: Deferred  Lab Results  Component Value Date   WBC 6.8 05/25/2015   HGB 13.8 05/25/2015   HCT 40.3 05/25/2015   MCV 85 05/25/2015   PLT 207 05/25/2015   Lab Results  Component Value Date   FERRITIN 325* 05/25/2015   IRON 81 05/25/2015   TIBC 279 05/25/2015   UIBC 198 05/25/2015   IRONPCTSAT 29 05/25/2015   Lab Results  Component Value Date   RETICCTPCT 1.0 01/14/2014   RBC 4.74 05/25/2015   RETICCTABS 45.0 01/14/2014   No results found for: KPAFRELGTCHN, LAMBDASER, KAPLAMBRATIO No results found for: IGGSERUM, IGA, IGMSERUM No results found for: Odetta Pink, SPEI   Chemistry      Component Value Date/Time   NA 146 05/25/2015 1115   NA 141 01/14/2014 1200   NA 141 06/26/2013 1200   K 4.6 05/25/2015 1115   K 3.6 01/14/2014 1200   K 4.7 06/26/2013 1200   CL 108 05/25/2015 1115   CL 104 01/14/2014 1200   CO2 26 05/25/2015 1115   CO2 29 01/14/2014 1200   CO2 27 06/26/2013 1200   BUN 11 05/25/2015 1115   BUN 12 01/14/2014 1200   BUN 12.2 06/26/2013 1200   CREATININE 0.75 05/25/2015 1115   CREATININE 1.0 01/14/2014 1200   CREATININE 0.9 06/26/2013 1200      Component Value Date/Time   CALCIUM 10.0 05/25/2015 1115   CALCIUM 9.3 01/14/2014 1200   CALCIUM 9.6 06/26/2013 1200   ALKPHOS 70 05/25/2015 1115   ALKPHOS 58 01/14/2014 1200   AST 17 05/25/2015 1115   AST 21 01/14/2014 1200   ALT 22 05/25/2015 1115   ALT 17 01/14/2014 1200   BILITOT 1.0 05/25/2015 1115   BILITOT 1.20 01/14/2014 1200     Impression and Plan: Ms. Melene Muller is a very pleasant 44 yo female with history of iron deficiency anemia secondary to menorrhagia. Her cycles have stopped but she is experiencing chronic fatigue and wanted to have her iron studies checked.  Her iron studies and CBC were normal. She will not need Feraheme at this time.  She follows up  with cardiology regarding her recent ED visit for chest pain next week.  She will continue to follow-up with [name] as needed.  She knows to call with any questions or concerns. We can certainly see her anytime she may need [name].   Eliezer Bottom, NP 9/14/20162:34 PM

## 2015-05-27 ENCOUNTER — Encounter: Payer: Self-pay | Admitting: Family

## 2015-05-28 NOTE — Assessment & Plan Note (Signed)
BP stable on current meds continue same.  

## 2015-05-28 NOTE — Assessment & Plan Note (Signed)
Lab Results  Component Value Date   HGBA1C 5.5 05/25/2015   A1C looks great. Continue DM diet.

## 2015-06-14 ENCOUNTER — Ambulatory Visit: Payer: BLUE CROSS/BLUE SHIELD

## 2015-08-23 ENCOUNTER — Other Ambulatory Visit: Payer: Self-pay | Admitting: Family

## 2015-08-24 ENCOUNTER — Ambulatory Visit: Payer: BLUE CROSS/BLUE SHIELD | Admitting: Family

## 2015-08-30 ENCOUNTER — Encounter: Payer: Self-pay | Admitting: Family

## 2015-08-30 ENCOUNTER — Ambulatory Visit (INDEPENDENT_AMBULATORY_CARE_PROVIDER_SITE_OTHER): Payer: BLUE CROSS/BLUE SHIELD | Admitting: Family

## 2015-08-30 VITALS — BP 144/96 | HR 81 | Temp 98.6°F | Resp 16 | Ht 65.0 in | Wt 192.0 lb

## 2015-08-30 DIAGNOSIS — F329 Major depressive disorder, single episode, unspecified: Secondary | ICD-10-CM | POA: Diagnosis not present

## 2015-08-30 DIAGNOSIS — F32A Depression, unspecified: Secondary | ICD-10-CM

## 2015-08-30 DIAGNOSIS — I10 Essential (primary) hypertension: Secondary | ICD-10-CM

## 2015-08-30 DIAGNOSIS — E119 Type 2 diabetes mellitus without complications: Secondary | ICD-10-CM | POA: Diagnosis not present

## 2015-08-30 MED ORDER — OLMESARTAN MEDOXOMIL 40 MG PO TABS: 40.0000 mg | ORAL_TABLET | Freq: Every day | ORAL | Status: DC

## 2015-08-30 NOTE — Progress Notes (Signed)
Pre visit review using our clinic review tool, if applicable. No additional management support is needed unless otherwise documented below in the visit note. 

## 2015-08-30 NOTE — Patient Instructions (Addendum)
Stop losartan, start benicar.  Follow up in 2 weeks.

## 2015-08-30 NOTE — Progress Notes (Signed)
Subjective:    Patient ID: Michele [name], female    DOB: 10/18/1970, 44 y.o.   MRN: [id number]  HPI   [name] is a 44 yr old female who presents today   DM2- reports that she does not take her metformin every day.  Lab Results  Component Value Date   HGBA1C 5.5 05/25/2015   HGBA1C 5.6 01/04/2015   HGBA1C 7.0* 09/30/2014   Lab Results  Component Value Date   MICROALBUR <0.7 05/25/2015   LDLCALC 112* 05/25/2015   CREATININE 0.75 05/25/2015    HTN- cardiology stopped verapamil due to dizziness.  Dizziness improved.   BP Readings from Last 3 Encounters:  08/30/15 144/96  05/26/15 138/100  05/25/15 132/92   Depression- having trouble getting out of bed.  Has apt with Dr. Toy Care next week.  Not currently on antidepressants.  Using xanax rarely.     Review of Systems    see HPI  Past Medical History  Diagnosis Date  . Depression   . Hypertension   . Anemia     iron deficient, microcytic, hypochromic  . Anxiety   . Migraines   . 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 Fibroids     uterine  . Palpitations     recurrent  . Fatigue   . Tachycardia     unspecified  . Chest pain     atypical  . 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 Posey Pronto at [facility]- S/p FNA March/april of two dominant nodules- benign.  Following for annual thyroid US with Dr. Posey Pronto.    . Diabetes mellitus without complication ([place])   . Fatty liver 08/15/2013    Social History   Social History  . Marital Status: Married    Spouse Name: [name]  . Number of Children: 2  . Years of Education: N/A   Occupational History  . 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 Smoking status: Never Smoker   . Smokeless tobacco: Never Used     Comment: never used tobacco  . Alcohol Use: Yes     Comment: Once a month  . Drug Use: No  . Sexual Activity: Yes    Birth Control/ Protection: None, Surgical   Other Topics Concern  . 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 Appendectomy  03/2009  . Tubal ligation  1997  . Tuboplasty / tubotubal anastomosis  2005  . Biopsy thyroid  November 28, 2009    Dr. Posey Pronto- endo  . Dilation and curettage of uterus  05/21/2011    Procedure: DILATATION AND CURETTAGE (D&C);  Surgeon: Elveria Royals;  Location: [facility];  Service: Gynecology;  Laterality: N/A;  dilitation and currettage/endometrial currettings    Family History  Problem Relation Age of Onset  . Hypertension Mother   . Cancer Mother 83    breast  . Cancer Father     colon  . Stroke Brother     handicapped due to complications from spinal meningitis  . Seizures Brother   . Asthma Son   . Arthritis Maternal Grandmother   . Hypertension Maternal Grandmother   . Stroke Maternal  Grandfather     Allergies  Allergen Reactions  . Amlodipine Shortness Of Breath, Anxiety, Palpitations and Other (See Comments)  . Bee Venom Anaphylaxis  . Bupropion Shortness Of Breath, Anxiety, Palpitations and Other (See Comments)  . Imitrex [Sumatriptan] Nausea Only  . Oxycodone Nausea Only    Current Outpatient Prescriptions on File Prior to Visit  Medication Sig Dispense Refill  . ALPRAZolam (XANAX) 1 MG tablet Take 1 mg by mouth as needed. For anxiety    . aspirin 81 MG tablet Take 81 mg by mouth at bedtime.     . Blood Glucose Monitoring Suppl (ACCU-CHEK AVIVA) device Use as instructed 1 each 0  . cyanocobalamin (,VITAMIN B-12,) 1000 MCG/ML injection Inject 1 mL into the muscle every 30 (thirty) days.    Marland Kitchen EPIPEN 2-PAK 0.3 MG/0.3ML SOAJ injection as needed.     . furosemide (LASIX) 20 MG tablet Take 1 tablet (20 mg total) by mouth daily. 30 tablet 3  . glucose blood (ACCU-CHEK AVIVA PLUS) test strip Use as instructed to check  blood sugar once a day.  Dx  E11.9 100 each 1  . ibuprofen (ADVIL,MOTRIN) 800 MG tablet Take 800 mg by mouth 3 (three) times daily.  0  . metFORMIN (GLUCOPHAGE) 500 MG tablet Take 500 mg by mouth 2 (two) times daily with a meal.    . polyethylene glycol (MIRALAX / GLYCOLAX) packet Take 17 g by mouth daily as needed for moderate constipation.    Marland Kitchen spironolactone (ALDACTONE) 25 MG tablet TAKE ONE TABLET BY MOUTH ONCE DAILY 30 tablet 5  . Vitamin D, Ergocalciferol, (DRISDOL) 50000 UNITS CAPS capsule Take 50,000 Units by mouth once a week.  1  . [DISCONTINUED] buPROPion (WELLBUTRIN XL) 150 MG 24 hr tablet Take 2 tablets (300 mg total) by mouth daily. 60 tablet 1   No current facility-administered medications on file prior to visit.    BP 144/96 mmHg  Pulse 81  Temp(Src) 98.6 F (37 C) (Oral)  Resp 16  Ht 5\' 5"  (1.651 m)  Wt 192 lb (87.091 kg)  BMI 31.95 kg/m2  SpO2 99%  LMP 06/11/2013    Objective:   Physical Exam  Constitutional: She is oriented to person, place, and time. She appears well-developed and well-nourished.  HENT:  Head: Normocephalic and atraumatic.  Cardiovascular: Normal rate, regular rhythm and normal heart sounds.   No murmur heard. Pulmonary/Chest: Effort normal and breath sounds normal. No respiratory distress. She has no wheezes.  Musculoskeletal: She exhibits no edema.  Neurological: She is alert and oriented to person, place, and time.  Psychiatric: She has a normal mood and affect. Her behavior is normal. Judgment and thought content normal.          Assessment & Plan:

## 2015-08-31 ENCOUNTER — Telehealth: Payer: Self-pay | Admitting: *Deleted

## 2015-08-31 MED ORDER — OLMESARTAN MEDOXOMIL 40 MG PO TABS: 40.0000 mg | ORAL_TABLET | Freq: Every day | ORAL | Status: DC

## 2015-08-31 NOTE — Telephone Encounter (Signed)
Received fax from pharmacy requesting PA on Olmesartan Medoxomil 40 mg, initiated PA via Cover My Meds; requesting Medical Necessity for px generic over Brand Benicar. Phoned [facility] to have Brand Benicar run thru system and it was accepted by [facility], gave VO to dispense, d/t non-coverage of generic, updated medication in EMR/SLS Thanks.

## 2015-09-01 NOTE — Assessment & Plan Note (Signed)
Stable, obtain a1c.  

## 2015-09-01 NOTE — Assessment & Plan Note (Signed)
BP mildly elevated.  Stop losartan, start benicar as this may be more effective.

## 2015-09-01 NOTE — Assessment & Plan Note (Signed)
Fair control, managed by psychiatry.

## 2015-09-14 ENCOUNTER — Telehealth: Payer: Self-pay | Admitting: Family

## 2015-09-14 NOTE — Telephone Encounter (Signed)
Follow up call placed to patient. States she was at work and on the phone with [place] this am.States she had to hang up and take another cal at work. Patient states she was started on a different BP medication and it has caused her HR to rise. Does not know rate in numbers just feels it is elevated. Appointment sheduled for tomorrow am/ States she does not want to see another provider. Advised patient to go straight to ED if symptoms worsen before appointment at 7am tomorrow patient agreed. Currently patient is not having chest pains of SOB.

## 2015-09-14 NOTE — Telephone Encounter (Signed)
Please call and followup with patient

## 2015-09-14 NOTE — Telephone Encounter (Signed)
PLEASE NOTE: All timestamps contained within this report are represented as [place] Standard Time. CONFIDENTIALTY NOTICE: This fax transmission is intended only for the addressee. It contains information that is legally privileged, confidential or otherwise protected from use or disclosure. If you are not the intended recipient, you are strictly prohibited from reviewing, disclosing, copying using or disseminating any of this information or taking any action in reliance on or regarding this information. If you have received this fax in error, please notify us immediately by telephone so that we can arrange for its return to [name]. Phone: [phone], Toll-Free: [phone], Fax: [phone] Page: 1 of 1 Call Id: [id number] [facility] - Client [place] Patient Name: Michele [name] DOB: Mar 30, 1971 Initial Comment Caller states the doctor changed her BP medication, and she is having some heart palpations. Nurse Assessment Nurse: Marcelline Deist, RN, Lynda Date/Time (Eastern Time): 09/14/2015 9:02:13 AM Confirm and document reason for call. If symptomatic, describe symptoms. ---Caller states the doctor changed her BP medication from Losartan to Benicar, and she is having some heart palpations/feels fast. Has the patient traveled out of the country within the last 30 days? ---Not Applicable Does the patient have any new or worsening symptoms? ---Yes Will a triage be completed? ---Yes Related visit to physician within the last 2 weeks? ---Yes Does the PT have any chronic conditions? (i.e. diabetes, asthma, etc.) ---Yes List chronic conditions. ---diabetes, on BP rx Did the patient indicate they were pregnant? ---No Is this a behavioral health or substance abuse call? ---No Guidelines Guideline Title Affirmed Question Affirmed Notes Final Disposition User Clinical Call Marcelline Deist, RN, Kermit Balo Comments As nurse was about  to triage, caller needed to put call on hold. Did not re-connect, so nurse will try to contact caller again. Attempted to reach caller a couple more times without connecting.

## 2015-09-15 ENCOUNTER — Ambulatory Visit (INDEPENDENT_AMBULATORY_CARE_PROVIDER_SITE_OTHER): Payer: BLUE CROSS/BLUE SHIELD | Admitting: Family

## 2015-09-15 ENCOUNTER — Encounter: Payer: Self-pay | Admitting: Family

## 2015-09-15 VITALS — BP 146/90 | HR 80 | Temp 97.9°F | Resp 16 | Ht 65.0 in | Wt 192.8 lb

## 2015-09-15 DIAGNOSIS — I10 Essential (primary) hypertension: Secondary | ICD-10-CM

## 2015-09-15 DIAGNOSIS — R002 Palpitations: Secondary | ICD-10-CM

## 2015-09-15 DIAGNOSIS — G4733 Obstructive sleep apnea (adult) (pediatric): Secondary | ICD-10-CM | POA: Diagnosis not present

## 2015-09-15 LAB — BASIC METABOLIC PANEL
BUN: 14 mg/dL (ref 6–23)
CALCIUM: 9.7 mg/dL (ref 8.4–10.5)
CHLORIDE: 103 meq/L (ref 96–112)
CO2: 29 meq/L (ref 19–32)
CREATININE: 0.77 mg/dL (ref 0.40–1.20)
GFR: 103.8 mL/min (ref >60.00)
GLUCOSE: 106 mg/dL — AB (ref 70–99)
Potassium: 4.6 mEq/L (ref 3.5–5.1)
Sodium: 141 mEq/L (ref 135–145)

## 2015-09-15 LAB — CBC WITH DIFFERENTIAL/PLATELET
BASOS PCT: 0.3 % (ref 0.0–3.0)
Basophils Absolute: 0 10*3/uL (ref 0.0–0.1)
EOS ABS: 0 10*3/uL (ref 0.0–0.7)
Eosinophils Relative: 0.1 % (ref 0.0–5.0)
HEMATOCRIT: 43.4 % (ref 36.0–46.0)
Hemoglobin: 14.4 g/dL (ref 12.0–15.0)
Lymphocytes Relative: 25.4 % (ref 12.0–46.0)
Lymphs Abs: 3.3 10*3/uL (ref 0.7–4.0)
MCHC: 33.2 g/dL (ref 30.0–36.0)
MCV: 86.3 fl (ref 78.0–100.0)
Monocytes Absolute: 0.9 10*3/uL (ref 0.1–1.0)
Monocytes Relative: 6.7 % (ref 3.0–12.0)
NEUTROS ABS: 8.8 10*3/uL — AB (ref 1.4–7.7)
Neutrophils Relative %: 67.5 % (ref 43.0–77.0)
PLATELETS: 245 10*3/uL (ref 150.0–400.0)
RBC: 5.03 Mil/uL (ref 3.87–5.11)
RDW: 13.5 % (ref 11.5–15.5)
WBC: 13 10*3/uL — ABNORMAL HIGH (ref 4.0–10.5)

## 2015-09-15 LAB — TSH: TSH: 1.84 u[IU]/mL (ref 0.35–4.50)

## 2015-09-15 MED ORDER — LOSARTAN POTASSIUM 100 MG PO TABS: 100.0000 mg | ORAL_TABLET | Freq: Every day | ORAL | Status: DC

## 2015-09-15 NOTE — Assessment & Plan Note (Signed)
Advised pt to follow up with Dr. Elsworth Soho and urged compliance with CPAP.  I think this will help her BP and her headaches.

## 2015-09-15 NOTE — Assessment & Plan Note (Signed)
While I don't think that her c/o palpitations and mild SOB is related to benicar (I believe that this is anxiety related), she believes she tolerated losartan better and her BP is really about the same. D/c benicar, resume losartan.  Restart CPAP- see below.

## 2015-09-15 NOTE — Progress Notes (Signed)
Subjective:    Patient ID: Michele [name], female    DOB: [birth year], 45 y.o.   MRN: [id number]  HPI  Michele [name] is a 45 yr old female who presents today with c/o elevated heart rate and shortness of breath. She believes that this began when she started Benicar. She has been on the benicar x 6-7 days.  BP Readings from Last 3 Encounters:  09/15/15 146/90  08/30/15 144/96  05/26/15 138/100   Saw Dr. Trula Ore [facility] Neuro). Had a nerve conduction study and reports that there was change in nerve damage on the side of her face. Reports skin flushes and fatigue.  Reports that she had some blood work on 12/29 and was told that her lyme titers are elevated and a med would be called.  Not using CPAP. Plans to follow up with Dr. Elsworth Soho, wants to get a new CPAP machine.    Review of Systems Past Medical History  Diagnosis Date  . Depression   . Hypertension   . Anemia     iron deficient, microcytic, hypochromic  . Anxiety   . Migraines   . 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 Fibroids     uterine  . Palpitations     recurrent  . Fatigue   . Tachycardia     unspecified  . Chest pain     atypical  . 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 Posey Pronto at [facility]- S/p FNA March/april of two dominant nodules- benign.  Following for annual thyroid US with Dr. Posey Pronto.    . Diabetes mellitus without complication ([place])   . Fatty liver 08/15/2013    Social History   Social History  . Marital Status: Married    Spouse Name: [name]  . Number of Children: 2  . Years of Education: N/A   Occupational History  . 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 Smoking status: Never Smoker   . Smokeless tobacco: Never Used     Comment: never used tobacco  . Alcohol Use: Yes     Comment: Once a month  . Drug Use: No  . Sexual Activity: Yes    Birth Control/ Protection: None, Surgical   Other Topics Concern  . 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 Appendectomy  03/2009  . Tubal ligation  1997  . Tuboplasty / tubotubal anastomosis  2005  . Biopsy thyroid  November 28, 2009    Dr. Posey Pronto- endo  . Dilation and curettage of uterus  05/21/2011    Procedure: DILATATION AND CURETTAGE (D&C);  Surgeon: Elveria Royals;  Location: [facility];  Service: Gynecology;  Laterality: N/A;  dilitation and currettage/endometrial currettings    Family History  Problem Relation Age of Onset  . Hypertension Mother   . Cancer Mother 60    breast  . Cancer Father     colon  . Stroke Brother     handicapped due to complications from spinal meningitis  . Seizures Brother   . Asthma Son   . Arthritis Maternal Grandmother   . Hypertension Maternal Grandmother   . Stroke Maternal Grandfather  Allergies  Allergen Reactions  . Amlodipine Shortness Of Breath, Anxiety, Palpitations and Other (See Comments)  . Bee Venom Anaphylaxis  . Bupropion Shortness Of Breath, Anxiety, Palpitations and Other (See Comments)  . Imitrex [Sumatriptan] Nausea Only  . Oxycodone Nausea Only    Current Outpatient Prescriptions on File Prior to Visit  Medication Sig Dispense Refill  . ALPRAZolam (XANAX) 1 MG tablet Take 1 mg by mouth as needed. For anxiety    . aspirin 81 MG tablet Take 81 mg by mouth at bedtime.     . Blood Glucose Monitoring Suppl (ACCU-CHEK AVIVA) device Use as instructed 1 each 0  . cyanocobalamin (,VITAMIN B-12,) 1000 MCG/ML injection Inject 1 mL into the muscle every 30 (thirty) days.    Marland Kitchen EPIPEN 2-PAK 0.3 MG/0.3ML SOAJ injection as needed.     . furosemide (LASIX) 20 MG tablet Take 1 tablet (20 mg total) by mouth daily. 30 tablet 3  . glucose blood (ACCU-CHEK AVIVA PLUS) test strip Use as instructed to check blood sugar once a  day.  Dx  E11.9 100 each 1  . ibuprofen (ADVIL,MOTRIN) 800 MG tablet Take 800 mg by mouth 3 (three) times daily.  0  . metFORMIN (GLUCOPHAGE) 500 MG tablet Take 500 mg by mouth 2 (two) times daily with a meal.    . olmesartan (BENICAR) 40 MG tablet Take 1 tablet (40 mg total) by mouth daily. DISPENSE BRAND NAME ONLY 30 tablet 2  . polyethylene glycol (MIRALAX / GLYCOLAX) packet Take 17 g by mouth daily as needed for moderate constipation.    Marland Kitchen spironolactone (ALDACTONE) 25 MG tablet TAKE ONE TABLET BY MOUTH ONCE DAILY 30 tablet 5  . Vitamin D, Ergocalciferol, (DRISDOL) 50000 UNITS CAPS capsule Take 50,000 Units by mouth once a week.  1  . [DISCONTINUED] buPROPion (WELLBUTRIN XL) 150 MG 24 hr tablet Take 2 tablets (300 mg total) by mouth daily. 60 tablet 1   No current facility-administered medications on file prior to visit.    BP 146/90 mmHg  Pulse 80  Temp(Src) 97.9 F (36.6 C) (Oral)  Resp 16  Ht 5\' 5"  (1.651 m)  Wt 192 lb 12.8 oz (87.454 kg)  BMI 32.08 kg/m2  SpO2 99%  LMP 06/11/2013       Objective:   Physical Exam  Constitutional: She is oriented to person, place, and time. She appears well-developed and well-nourished.  HENT:  Head: Normocephalic and atraumatic.  Cardiovascular: Normal rate, regular rhythm and normal heart sounds.   No murmur heard. Pulmonary/Chest: Effort normal and breath sounds normal. No respiratory distress. She has no wheezes.  Musculoskeletal: She exhibits no edema.  Neurological: She is alert and oriented to person, place, and time.  Psychiatric: She has a normal mood and affect. Her behavior is normal. Judgment and thought content normal.          Assessment & Plan:  Subjective Tachycardia/palpitations-  Pt reports that she was having symptoms in the room at time of EKG.  EKG is reviewed notes NSR with a rate of 77.  I think that her symptoms are related to anxiety. Will obtain labs as noted below to rule out electrolyte abnormality,  thyroid abnormality and anemia as contributing factors.   I did advise pt to follow up with her neurologist re: ?elavated Lyme titer and recommended treatment.

## 2015-09-15 NOTE — Patient Instructions (Addendum)
Please complete lab work prior to leaving. Stop Benicar, start losartan.  Follow up as scheduled.

## 2015-09-15 NOTE — Progress Notes (Signed)
Pre visit review using our clinic review tool, if applicable. No additional management support is needed unless otherwise documented below in the visit note. 

## 2015-09-17 NOTE — Telephone Encounter (Signed)
Pt saw Debbrah Alar, NP on 09/15/15.

## 2015-09-29 ENCOUNTER — Ambulatory Visit: Payer: BLUE CROSS/BLUE SHIELD | Admitting: Family

## 2015-10-06 ENCOUNTER — Ambulatory Visit (INDEPENDENT_AMBULATORY_CARE_PROVIDER_SITE_OTHER): Payer: BLUE CROSS/BLUE SHIELD | Admitting: Family

## 2015-10-06 ENCOUNTER — Encounter: Payer: Self-pay | Admitting: Family

## 2015-10-06 VITALS — BP 124/82 | HR 80 | Temp 98.5°F | Resp 16 | Ht 65.0 in | Wt 195.2 lb

## 2015-10-06 DIAGNOSIS — D72829 Elevated white blood cell count, unspecified: Secondary | ICD-10-CM

## 2015-10-06 DIAGNOSIS — I10 Essential (primary) hypertension: Secondary | ICD-10-CM

## 2015-10-06 DIAGNOSIS — E119 Type 2 diabetes mellitus without complications: Secondary | ICD-10-CM | POA: Diagnosis not present

## 2015-10-06 DIAGNOSIS — R202 Paresthesia of skin: Secondary | ICD-10-CM

## 2015-10-06 LAB — CBC WITH DIFFERENTIAL/PLATELET
Basophils Absolute: 0 10*3/uL (ref 0.0–0.1)
Basophils Relative: 0.3 % (ref 0.0–3.0)
EOS PCT: 1.4 % (ref 0.0–5.0)
Eosinophils Absolute: 0.1 10*3/uL (ref 0.0–0.7)
HCT: 41.8 % (ref 36.0–46.0)
Hemoglobin: 14 g/dL (ref 12.0–15.0)
LYMPHS ABS: 2.2 10*3/uL (ref 0.7–4.0)
Lymphocytes Relative: 35 % (ref 12.0–46.0)
MCHC: 33.6 g/dL (ref 30.0–36.0)
MCV: 85.7 fl (ref 78.0–100.0)
MONO ABS: 0.4 10*3/uL (ref 0.1–1.0)
MONOS PCT: 7.1 % (ref 3.0–12.0)
NEUTROS ABS: 3.5 10*3/uL (ref 1.4–7.7)
NEUTROS PCT: 56.2 % (ref 43.0–77.0)
PLATELETS: 218 10*3/uL (ref 150.0–400.0)
RBC: 4.88 Mil/uL (ref 3.87–5.11)
RDW: 14.1 % (ref 11.5–15.5)
WBC: 6.2 10*3/uL (ref 4.0–10.5)

## 2015-10-06 LAB — HEMOGLOBIN A1C: HEMOGLOBIN A1C: 5.9 % (ref 4.6–6.5)

## 2015-10-06 NOTE — Assessment & Plan Note (Signed)
Sounds like left trigeminal neuralgia- I advised pt to follow back up with her neurologist for this.

## 2015-10-06 NOTE — Progress Notes (Signed)
Subjective:    Patient ID: Michele [name], female    DOB: 11-22-1970, 45 y.o.   MRN: [id number]  HPI  [name] is a 45 yr old female who presents today for follow up.  1) HTN- pt reports near resolution of palpitations since discontinuing benicar.  She was instead placed on losartan last visit.   BP Readings from Last 3 Encounters:  10/06/15 124/82  09/15/15 146/90  08/30/15 144/96    2) DM2- patient is not taking metformin.  Lab Results  Component Value Date   HGBA1C 5.5 05/25/2015   HGBA1C 5.6 01/04/2015   HGBA1C 7.0* 09/30/2014   Lab Results  Component Value Date   MICROALBUR <0.7 05/25/2015   LDLCALC 112* 05/25/2015   CREATININE 0.77 09/15/2015   She reports some left sided facial numbness. Notes some mild associated pain.  Has seen Dr. 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  Denies vision changes.    She is following with Dr. Collene Mares re: right upper abdominal pain, bloating. She has recommended cholecystectomy.     Review of Systems See HPI  Past Medical History  Diagnosis Date  . Depression   . Hypertension   . Anemia     iron deficient, microcytic, hypochromic  . Anxiety   . Migraines   . 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 Fibroids     uterine  . Palpitations     recurrent  . Fatigue   . Tachycardia     unspecified  . Chest pain     atypical  . 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 Posey Pronto at [facility]- S/p FNA March/april of two dominant nodules- benign.  Following for annual thyroid US with Dr. Posey Pronto.    . Diabetes mellitus without complication ([place])   . Fatty liver 08/15/2013    Social History   Social History  . Marital Status: Married    Spouse Name: [name]  . Number of Children: 2  . Years of Education: N/A   Occupational History  . 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 Smoking status: Never Smoker   . Smokeless tobacco:  Never Used     Comment: never used tobacco  . Alcohol Use: Yes     Comment: Once a month  . Drug Use: No  . Sexual Activity: Yes    Birth Control/ Protection: None, Surgical   Other Topics Concern  . 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 Appendectomy  03/2009  . Tubal ligation  1997  . Tuboplasty / tubotubal anastomosis  2005  . Biopsy thyroid  November 28, 2009    Dr. Posey Pronto- endo  . Dilation and curettage of uterus  05/21/2011    Procedure: DILATATION AND CURETTAGE (D&C);  Surgeon: Elveria Royals;  Location: [facility];  Service: Gynecology;  Laterality: N/A;  dilitation and currettage/endometrial currettings    Family History  Problem Relation Age of Onset  . Hypertension Mother   . Cancer Mother 65    breast  . Cancer Father     colon  . Stroke Brother  handicapped due to complications from spinal meningitis  . Seizures Brother   . Asthma Son   . Arthritis Maternal Grandmother   . Hypertension Maternal Grandmother   . Stroke Maternal Grandfather     Allergies  Allergen Reactions  . Amlodipine Shortness Of Breath, Anxiety, Palpitations and Other (See Comments)  . Bee Venom Anaphylaxis  . Bupropion Shortness Of Breath, Anxiety, Palpitations and Other (See Comments)  . Imitrex [Sumatriptan] Nausea Only  . Oxycodone Nausea Only    Current Outpatient Prescriptions on File Prior to Visit  Medication Sig Dispense Refill  . ALPRAZolam (XANAX) 1 MG tablet Take 1 mg by mouth as needed. For anxiety    . aspirin 81 MG tablet Take 81 mg by mouth at bedtime.     . Blood Glucose Monitoring Suppl (ACCU-CHEK AVIVA) device Use as instructed 1 each 0  . cyanocobalamin (,VITAMIN B-12,) 1000 MCG/ML injection Inject 1 mL into the muscle every 30 (thirty) days.    Marland Kitchen  EPIPEN 2-PAK 0.3 MG/0.3ML SOAJ injection as needed.     . furosemide (LASIX) 20 MG tablet Take 1 tablet (20 mg total) by mouth daily. 30 tablet 3  . glucose blood (ACCU-CHEK AVIVA PLUS) test strip Use as instructed to check blood sugar once a day.  Dx  E11.9 100 each 1  . ibuprofen (ADVIL,MOTRIN) 800 MG tablet Take 800 mg by mouth 3 (three) times daily.  0  . losartan (COZAAR) 100 MG tablet Take 1 tablet (100 mg total) by mouth daily. 30 tablet 5  . polyethylene glycol (MIRALAX / GLYCOLAX) packet Take 17 g by mouth daily as needed for moderate constipation.    . rizatriptan (MAXALT) 10 MG tablet Take 10 mg by mouth as needed. migraine  0  . spironolactone (ALDACTONE) 25 MG tablet TAKE ONE TABLET BY MOUTH ONCE DAILY 30 tablet 5  . valACYclovir (VALTREX) 1000 MG tablet Take as directed for elevated lyme titer.  0  . Vitamin D, Ergocalciferol, (DRISDOL) 50000 UNITS CAPS capsule Take 50,000 Units by mouth once a week.  1  . [DISCONTINUED] buPROPion (WELLBUTRIN XL) 150 MG 24 hr tablet Take 2 tablets (300 mg total) by mouth daily. 60 tablet 1   No current facility-administered medications on file prior to visit.    BP 124/82 mmHg  Pulse 80  Temp(Src) 98.5 F (36.9 C) (Oral)  Resp 16  Ht 5\' 5"  (1.651 m)  Wt 195 lb 3.2 oz (88.542 kg)  BMI 32.48 kg/m2  SpO2 100%  LMP 06/11/2013       Objective:   Physical Exam  Constitutional: She is oriented to person, place, and time. She appears well-developed and well-nourished.  Cardiovascular: Normal rate, regular rhythm and normal heart sounds.   No murmur heard. Pulmonary/Chest: Effort normal and breath sounds normal. No respiratory distress. She has no wheezes.  Neurological: She is alert and oriented to person, place, and time.  Mild decreased sensation left cheek.   Psychiatric: She has a normal mood and affect. Her behavior is normal. Judgment and thought content normal.          Assessment & Plan:

## 2015-10-06 NOTE — Patient Instructions (Signed)
Please complete lab work prior to leaving. Follow back up with Dr. Trula Ore re: your facial pain. Please check blood pressure once daily for a week then send me your readings via mychart.

## 2015-10-06 NOTE — Assessment & Plan Note (Signed)
BP looks good here today. She reports higher readings at home. Advised pt to check bp at home x 1 week and email me her readings in 1 week.

## 2015-10-06 NOTE — Progress Notes (Signed)
Pre visit review using our clinic review tool, if applicable. No additional management support is needed unless otherwise documented below in the visit note. 

## 2015-10-06 NOTE — Assessment & Plan Note (Signed)
Obtain follow up a1c.  

## 2015-10-08 ENCOUNTER — Other Ambulatory Visit: Payer: Self-pay

## 2015-10-12 ENCOUNTER — Other Ambulatory Visit: Payer: Self-pay | Admitting: Allergy

## 2015-10-12 MED ORDER — LEVALBUTEROL TARTRATE 45 MCG/ACT IN AERO: 2.0000 | INHALATION_SPRAY | Freq: Four times a day (QID) | RESPIRATORY_TRACT | Status: DC | PRN
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
## 2015-11-18 ENCOUNTER — Encounter: Payer: Self-pay | Admitting: *Deleted

## 2015-11-18 ENCOUNTER — Encounter: Payer: Self-pay | Admitting: Physician Assistant

## 2015-11-18 ENCOUNTER — Ambulatory Visit (INDEPENDENT_AMBULATORY_CARE_PROVIDER_SITE_OTHER): Payer: BLUE CROSS/BLUE SHIELD | Admitting: Physician Assistant

## 2015-11-18 VITALS — BP 140/100 | HR 82 | Ht 64.5 in | Wt 187.8 lb

## 2015-11-18 DIAGNOSIS — R002 Palpitations: Secondary | ICD-10-CM | POA: Diagnosis not present

## 2015-11-18 DIAGNOSIS — I10 Essential (primary) hypertension: Secondary | ICD-10-CM

## 2015-11-18 DIAGNOSIS — R0789 Other chest pain: Secondary | ICD-10-CM

## 2015-11-18 DIAGNOSIS — R42 Dizziness and giddiness: Secondary | ICD-10-CM

## 2015-11-18 DIAGNOSIS — Z87898 Personal history of other specified conditions: Secondary | ICD-10-CM

## 2015-11-18 DIAGNOSIS — G4733 Obstructive sleep apnea (adult) (pediatric): Secondary | ICD-10-CM

## 2015-11-18 DIAGNOSIS — Z9189 Other specified personal risk factors, not elsewhere classified: Secondary | ICD-10-CM

## 2015-11-18 MED ORDER — DILTIAZEM HCL ER COATED BEADS 120 MG PO CP24: 120.0000 mg | ORAL_CAPSULE | Freq: Every day | ORAL | Status: DC

## 2015-11-18 NOTE — Patient Instructions (Addendum)
Medication Instructions:   START TAKING CARDIZEM 120 MG ONCE A DAY   If you need a refill on your cardiac medications before your next appointment, please call your pharmacy.  Labwork: NONE ORDER TODAY  Testing/Procedures: NONE ORDER TODAY  Follow-Up: IN 3 MONTHS WITH COPPER OR  WITH WEAVER ON A DAY COOPER IN OFFICE    Any Other Special Instructions Will Be Listed Below (If Applicable).

## 2015-12-01 ENCOUNTER — Encounter: Payer: Self-pay | Admitting: Family

## 2015-12-01 ENCOUNTER — Ambulatory Visit (INDEPENDENT_AMBULATORY_CARE_PROVIDER_SITE_OTHER): Payer: BLUE CROSS/BLUE SHIELD | Admitting: Family

## 2015-12-01 VITALS — BP 150/110 | HR 77 | Temp 98.0°F | Resp 16 | Ht 65.0 in | Wt 192.2 lb

## 2015-12-01 DIAGNOSIS — F418 Other specified anxiety disorders: Secondary | ICD-10-CM

## 2015-12-01 DIAGNOSIS — G4733 Obstructive sleep apnea (adult) (pediatric): Secondary | ICD-10-CM | POA: Diagnosis not present

## 2015-12-01 DIAGNOSIS — I10 Essential (primary) hypertension: Secondary | ICD-10-CM

## 2015-12-01 DIAGNOSIS — E119 Type 2 diabetes mellitus without complications: Secondary | ICD-10-CM

## 2015-12-01 DIAGNOSIS — F419 Anxiety disorder, unspecified: Secondary | ICD-10-CM

## 2015-12-01 DIAGNOSIS — F32A Depression, unspecified: Secondary | ICD-10-CM

## 2015-12-01 DIAGNOSIS — F329 Major depressive disorder, single episode, unspecified: Secondary | ICD-10-CM

## 2015-12-01 NOTE — Assessment & Plan Note (Addendum)
Lab Results  Component Value Date   HGBA1C 5.9 10/06/2015   We discussed that her elevated insulin level is due to hx of type 2 dm. She wants to lower her insulin level. We discussed weight loss.  25 min spent with pt today.  >50% of this time was spent counseling patient on diabetes, HTN, weight loss.

## 2015-12-01 NOTE — Assessment & Plan Note (Signed)
Advised pt to follow up with Dr. Elsworth Soho and resume CPAP as I believe that this should help her BP as well.

## 2015-12-01 NOTE — Progress Notes (Signed)
Pre visit review using our clinic review tool, if applicable. No additional management support is needed unless otherwise documented below in the visit note. 

## 2015-12-01 NOTE — Assessment & Plan Note (Signed)
Fair control.

## 2015-12-01 NOTE — Progress Notes (Signed)
Subjective:    Patient ID: Michele [name], female    DOB: [birth year], 45 y.o.   MRN: [id number]  HPI  [name] is a 45 yr old female who presents today for follow up.  She reports that she recently went to [facility] and had blood testing performed to evaluate her fatigue.  She brings copies of her lab work.  Note was made of an elevated insulin level.    HTN- Pt reports that she did not start the diltiazem cd which was sent by cardiology. She is not using her CPAP.   BP Readings from Last 3 Encounters:  12/01/15 150/110  11/18/15 140/100  10/06/15 124/82    Depression/anxiety- Reports that she takes xanax on a prn basis.  Mood is good.  Walking 5-6 miles a day.  Review of Systems   See HPI   Past Medical History  Diagnosis Date  . Depression   . Hypertension   . Anemia     iron deficient, microcytic, hypochromic  . Anxiety   . Migraines   . 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 Fibroids     uterine  . Palpitations     recurrent  . Fatigue   . Tachycardia     unspecified  . Chest pain     atypical  . 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 Posey Pronto at [facility]- S/p FNA March/april of two dominant nodules- benign.  Following for annual thyroid US with Dr. Posey Pronto.    . Diabetes mellitus without complication ([place])   . Fatty liver 08/15/2013    Social History   Social History  . Marital Status: Married    Spouse Name: [name]  . Number of Children: 2  . Years of Education: N/A   Occupational History  . 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 Smoking status: Never Smoker   . Smokeless tobacco: Never Used     Comment: never used tobacco  . Alcohol Use: Yes     Comment: Once a month  . Drug Use: No  . Sexual Activity: Yes    Birth Control/ Protection: None, Surgical   Other Topics Concern  . 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 Appendectomy  03/2009  . Tubal ligation  1997  . Tuboplasty / tubotubal anastomosis  2005  . Biopsy thyroid  November 28, 2009    Dr. Posey Pronto- endo  . Dilation and curettage of uterus  05/21/2011    Procedure: DILATATION AND CURETTAGE (D&C);  Surgeon: Elveria Royals;  Location: [facility];  Service: Gynecology;  Laterality: N/A;  dilitation and currettage/endometrial currettings    Family History  Problem Relation Age of Onset  . Hypertension Mother   . Cancer Mother 53    breast  . Cancer Father     colon  . Stroke Brother     handicapped due to complications from spinal meningitis  . Seizures Brother   . Asthma Son   . Arthritis Maternal Grandmother   . Hypertension Maternal Grandmother   . Stroke Maternal Grandfather     Allergies  Allergen Reactions  . Amlodipine  Shortness Of Breath, Anxiety, Palpitations and Other (See Comments)  . Bee Venom Anaphylaxis  . Bupropion Shortness Of Breath, Anxiety, Palpitations and Other (See Comments)  . Imitrex [Sumatriptan] Nausea Only  . Oxycodone Nausea Only    Current Outpatient Prescriptions on File Prior to Visit  Medication Sig Dispense Refill  . ALPRAZolam (XANAX) 1 MG tablet Take 1 mg by mouth as needed. For anxiety    . cyanocobalamin (,VITAMIN B-12,) 1000 MCG/ML injection Inject 1 mL into the muscle every 30 (thirty) days.    Marland Kitchen diltiazem (CARDIZEM CD) 120 MG 24 hr capsule Take 1 capsule (120 mg total) by mouth daily. 30 capsule 5  . EPIPEN 2-PAK 0.3 MG/0.3ML SOAJ injection daily as needed (FOR ALLERGY).     Marland Kitchen ibuprofen (ADVIL,MOTRIN) 800 MG tablet Take 800 mg by mouth 3 (three) times daily.  0  . levalbuterol (XOPENEX HFA) 45 MCG/ACT inhaler Inhale 2 puffs into the lungs every 6 (six) hours as needed for wheezing. 1 Inhaler 5  . Linaclotide (LINZESS) 290 MCG CAPS capsule Take 290  mcg by mouth daily.    Marland Kitchen losartan (COZAAR) 100 MG tablet Take 1 tablet (100 mg total) by mouth daily. 30 tablet 5  . spironolactone (ALDACTONE) 25 MG tablet TAKE ONE TABLET BY MOUTH ONCE DAILY 30 tablet 5  . Vitamin D, Ergocalciferol, (DRISDOL) 50000 UNITS CAPS capsule Take 50,000 Units by mouth once a week.  1  . [DISCONTINUED] buPROPion (WELLBUTRIN XL) 150 MG 24 hr tablet Take 2 tablets (300 mg total) by mouth daily. 60 tablet 1   No current facility-administered medications on file prior to visit.    BP 150/110 mmHg  Pulse 77  Temp(Src) 98 F (36.7 C) (Oral)  Resp 16  Ht 5\' 5"  (1.651 m)  Wt 192 lb 3.2 oz (87.181 kg)  BMI 31.98 kg/m2  SpO2 97%  LMP 06/11/2013       Objective:   Physical Exam  Constitutional: She is oriented to person, place, and time. She appears well-developed and well-nourished.  HENT:  Head: Normocephalic and atraumatic.  Cardiovascular: Normal rate, regular rhythm and normal heart sounds.   No murmur heard. Pulmonary/Chest: Effort normal and breath sounds normal. No respiratory distress. She has no wheezes.  Neurological: She is alert and oriented to person, place, and time.  Psychiatric: She has a normal mood and affect. Her behavior is normal. Judgment and thought content normal.          Assessment & Plan:

## 2015-12-01 NOTE — Assessment & Plan Note (Signed)
Uncontrolled.  Advised pt to start diltiazem and restart cpap.  Send me bp readings in 1 week via mychart.

## 2015-12-01 NOTE — Patient Instructions (Signed)
Start diltiazem once daily. Work on weight loss. Restart CPAP. Follow up in 1 month.

## 2015-12-21 DIAGNOSIS — Z713 Dietary counseling and surveillance: Secondary | ICD-10-CM | POA: Diagnosis not present

## 2015-12-23 DIAGNOSIS — E7800 Pure hypercholesterolemia, unspecified: Secondary | ICD-10-CM | POA: Diagnosis not present

## 2015-12-23 DIAGNOSIS — E538 Deficiency of other specified B group vitamins: Secondary | ICD-10-CM | POA: Diagnosis not present

## 2015-12-23 DIAGNOSIS — E669 Obesity, unspecified: Secondary | ICD-10-CM | POA: Diagnosis not present

## 2015-12-23 DIAGNOSIS — E161 Other hypoglycemia: Secondary | ICD-10-CM | POA: Diagnosis not present

## 2015-12-25 DIAGNOSIS — K429 Umbilical hernia without obstruction or gangrene: Secondary | ICD-10-CM | POA: Diagnosis not present

## 2015-12-25 DIAGNOSIS — N390 Urinary tract infection, site not specified: Secondary | ICD-10-CM | POA: Diagnosis not present

## 2015-12-25 DIAGNOSIS — R509 Fever, unspecified: Secondary | ICD-10-CM | POA: Diagnosis not present

## 2015-12-25 DIAGNOSIS — R531 Weakness: Secondary | ICD-10-CM | POA: Diagnosis not present

## 2015-12-25 DIAGNOSIS — R109 Unspecified abdominal pain: Secondary | ICD-10-CM | POA: Diagnosis not present

## 2015-12-25 DIAGNOSIS — K5900 Constipation, unspecified: Secondary | ICD-10-CM | POA: Diagnosis not present

## 2015-12-25 DIAGNOSIS — G4733 Obstructive sleep apnea (adult) (pediatric): Secondary | ICD-10-CM | POA: Diagnosis not present

## 2015-12-25 DIAGNOSIS — E119 Type 2 diabetes mellitus without complications: Secondary | ICD-10-CM | POA: Diagnosis not present

## 2015-12-25 DIAGNOSIS — F329 Major depressive disorder, single episode, unspecified: Secondary | ICD-10-CM | POA: Diagnosis not present

## 2015-12-25 DIAGNOSIS — F419 Anxiety disorder, unspecified: Secondary | ICD-10-CM | POA: Diagnosis not present

## 2015-12-25 DIAGNOSIS — R6883 Chills (without fever): Secondary | ICD-10-CM | POA: Diagnosis not present

## 2015-12-25 DIAGNOSIS — R079 Chest pain, unspecified: Secondary | ICD-10-CM | POA: Diagnosis not present

## 2015-12-25 DIAGNOSIS — M797 Fibromyalgia: Secondary | ICD-10-CM | POA: Diagnosis not present

## 2015-12-25 DIAGNOSIS — I10 Essential (primary) hypertension: Secondary | ICD-10-CM | POA: Diagnosis not present

## 2016-01-04 ENCOUNTER — Ambulatory Visit: Payer: BLUE CROSS/BLUE SHIELD | Admitting: Family

## 2016-01-07 ENCOUNTER — Encounter: Payer: Self-pay | Admitting: Family

## 2016-01-07 ENCOUNTER — Ambulatory Visit (INDEPENDENT_AMBULATORY_CARE_PROVIDER_SITE_OTHER): Payer: BLUE CROSS/BLUE SHIELD | Admitting: Family

## 2016-01-07 VITALS — BP 140/100 | HR 85 | Temp 98.1°F | Resp 16 | Ht 65.0 in | Wt 190.8 lb

## 2016-01-07 DIAGNOSIS — F32A Depression, unspecified: Secondary | ICD-10-CM

## 2016-01-07 DIAGNOSIS — G4733 Obstructive sleep apnea (adult) (pediatric): Secondary | ICD-10-CM | POA: Diagnosis not present

## 2016-01-07 DIAGNOSIS — F418 Other specified anxiety disorders: Secondary | ICD-10-CM | POA: Diagnosis not present

## 2016-01-07 DIAGNOSIS — F329 Major depressive disorder, single episode, unspecified: Secondary | ICD-10-CM

## 2016-01-07 DIAGNOSIS — F419 Anxiety disorder, unspecified: Secondary | ICD-10-CM

## 2016-01-07 DIAGNOSIS — I10 Essential (primary) hypertension: Secondary | ICD-10-CM

## 2016-01-07 MED ORDER — HYDRALAZINE HCL 25 MG PO TABS: 25.0000 mg | ORAL_TABLET | Freq: Three times a day (TID) | ORAL | Status: DC

## 2016-01-07 NOTE — Assessment & Plan Note (Signed)
Currently stable. Monitor.  

## 2016-01-07 NOTE — Progress Notes (Signed)
Subjective:    Patient ID: Michele [name], female    DOB: November 01, 1970, 45 y.o.   MRN: [id number]  HPI  Michele [name] is a 45 yr old female who presents today for follow up.  1) HTN- last visit she was advised to begin diltiazem.  She tried medication, but then developed severe HA, so only took x 2 days then stopped. BP Readings from Last 3 Encounters:  01/07/16 140/100  12/01/15 150/110  11/18/15 140/100   2) OSA-  Restarted CPAP, not as tired.    3) depression/anxiety- Reports work is stressful.  Overall mood is good.   She reports + hot flashes, "fevers". Was seen in an ED in [place] and was told that she was dehydrated.  This occurred around 4/18. She reports that she was having rigors.  Reports "high" fever.  She reports flu test was negative. HR was reportedly in the 120's.  She was given rx for cephalexin.  Reports still having chills but not having fever.  She is having night sweats.  Denies dysuria, frequency. + left hip pain but no other joint pain.  She had acute vomiting during that episode.   Review of Systems See HPI  Past Medical History  Diagnosis Date  . Depression   . Hypertension   . Anemia     iron deficient, microcytic, hypochromic  . Anxiety   . Migraines   . 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 Fibroids     uterine  . Palpitations     recurrent  . Fatigue   . Tachycardia     unspecified  . Chest pain     atypical  . 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 Posey Pronto at [facility]- S/p FNA March/april of two dominant nodules- benign.  Following for annual thyroid US with Dr. Posey Pronto.    . Diabetes mellitus without complication ([place])   . Fatty liver 08/15/2013     Social History   Social History  . Marital Status: Married    Spouse Name: [name]  . Number of Children: 2  . Years of Education: N/A   Occupational History  . 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 Smoking status: Never Smoker   .  Smokeless tobacco: Never Used     Comment: never used tobacco  . Alcohol Use: Yes     Comment: Once a month  . Drug Use: No  . Sexual Activity: Yes    Birth Control/ Protection: None, Surgical   Other Topics Concern  . 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 Appendectomy  03/2009  . Tubal ligation  1997  . Tuboplasty / tubotubal anastomosis  2005  . Biopsy thyroid  November 28, 2009    Dr. Posey Pronto- endo  . Dilation and curettage of uterus  05/21/2011    Procedure: DILATATION AND CURETTAGE (D&C);  Surgeon: Elveria Royals;  Location: [facility];  Service: Gynecology;  Laterality: N/A;  dilitation and currettage/endometrial currettings    Family History  Problem Relation Age of Onset  . Hypertension Mother   . Cancer Mother 44    breast  . Cancer Father  colon  . Stroke Brother     handicapped due to complications from spinal meningitis  . Seizures Brother   . Asthma Son   . Arthritis Maternal Grandmother   . Hypertension Maternal Grandmother   . Stroke Maternal Grandfather     Allergies  Allergen Reactions  . Amlodipine Shortness Of Breath, Anxiety, Palpitations and Other (See Comments)  . Bee Venom Anaphylaxis  . Bupropion Shortness Of Breath, Anxiety, Palpitations and Other (See Comments)  . Imitrex [Sumatriptan] Nausea Only  . Oxycodone Nausea Only    Current Outpatient Prescriptions on File Prior to Visit  Medication Sig Dispense Refill  . ALPRAZolam (XANAX) 1 MG tablet Take 1 mg by mouth as needed. For anxiety    . cyanocobalamin (,VITAMIN B-12,) 1000 MCG/ML injection Inject 1 mL into the muscle every 30 (thirty) days.    Marland Kitchen EPIPEN 2-PAK 0.3 MG/0.3ML SOAJ injection daily as needed (FOR ALLERGY).     Marland Kitchen ibuprofen (ADVIL,MOTRIN) 800 MG tablet Take 800 mg by  mouth 3 (three) times daily.  0  . levalbuterol (XOPENEX HFA) 45 MCG/ACT inhaler Inhale 2 puffs into the lungs every 6 (six) hours as needed for wheezing. 1 Inhaler 5  . Linaclotide (LINZESS) 290 MCG CAPS capsule Take 290 mcg by mouth daily.    Marland Kitchen losartan (COZAAR) 100 MG tablet Take 1 tablet (100 mg total) by mouth daily. 30 tablet 5  . spironolactone (ALDACTONE) 25 MG tablet TAKE ONE TABLET BY MOUTH ONCE DAILY 30 tablet 5  . Vitamin D, Ergocalciferol, (DRISDOL) 50000 UNITS CAPS capsule Take 50,000 Units by mouth once a week.  1  . diltiazem (CARDIZEM CD) 120 MG 24 hr capsule Take 1 capsule (120 mg total) by mouth daily. (Patient not taking: Reported on 01/07/2016) 30 capsule 5  . [DISCONTINUED] buPROPion (WELLBUTRIN XL) 150 MG 24 hr tablet Take 2 tablets (300 mg total) by mouth daily. 60 tablet 1   No current facility-administered medications on file prior to visit.    BP 140/100 mmHg  Pulse 85  Temp(Src) 98.1 F (36.7 C) (Oral)  Resp 16  Ht 5\' 5"  (1.651 m)  Wt 190 lb 12.8 oz (86.546 kg)  BMI 31.75 kg/m2  SpO2 99%  LMP 06/11/2013       Objective:   Physical Exam  Constitutional: She is oriented to person, place, and time. She appears well-developed and well-nourished.  HENT:  Head: Normocephalic and atraumatic.  Right Ear: Tympanic membrane and ear canal normal.  Left Ear: Tympanic membrane and ear canal normal.  Mouth/Throat: No oropharyngeal exudate, posterior oropharyngeal edema or posterior oropharyngeal erythema.  Cardiovascular: Normal rate, regular rhythm and normal heart sounds.   No murmur heard. Pulmonary/Chest: Effort normal and breath sounds normal. No respiratory distress. She has no wheezes.  Abdominal: Soft. Bowel sounds are normal. She exhibits no distension and no mass. There is no tenderness. There is no rebound and no guarding.  Musculoskeletal: She exhibits no edema.  Lymphadenopathy:    She has no cervical adenopathy.  Neurological: She is alert and  oriented to person, place, and time.  Psychiatric: She has a normal mood and affect. Her behavior is normal. Judgment and thought content normal.          Assessment & Plan:  We have requested records from her ER visit in [place]. She is doing much better.  It sounds to me like she had an acute viral illness which has resolved.  I think her night sweats are likely related to menopause.  Monitor.

## 2016-01-07 NOTE — Progress Notes (Signed)
Pre visit review using our clinic review tool, if applicable. No additional management support is needed unless otherwise documented below in the visit note. 

## 2016-01-07 NOTE — Assessment & Plan Note (Signed)
Uncontrolled.  Advised pt to add Hydralazine TID. Follow up in 1 month for BP check.

## 2016-01-07 NOTE — Assessment & Plan Note (Signed)
Less tired now that she is back on cpap.

## 2016-01-07 NOTE — Patient Instructions (Signed)
Begin Hydralazine 3x daily for blood pressure.

## 2016-01-11 ENCOUNTER — Telehealth: Payer: Self-pay | Admitting: *Deleted

## 2016-01-11 NOTE — Telephone Encounter (Signed)
Pt signed records release to obtain most recent ER records from [facility] in [place].  Release faxed to [phone]. Awaiting records.

## 2016-01-11 NOTE — Telephone Encounter (Signed)
Forwarded to Melissa/Tricia. JG//CMA

## 2016-02-03 NOTE — Telephone Encounter (Signed)
Records have not been received as of 02/03/16. JG//CMA

## 2016-02-03 NOTE — Telephone Encounter (Signed)
Janett Billow do you know if you have done this one? Just going through encounters in chart.

## 2016-02-04 ENCOUNTER — Ambulatory Visit: Payer: BLUE CROSS/BLUE SHIELD | Admitting: Family

## 2016-02-10 NOTE — Telephone Encounter (Signed)
Called patient to inform her that as of last week records had not been received in our office.

## 2016-02-18 DIAGNOSIS — Z6833 Body mass index (BMI) 33.0-33.9, adult: Secondary | ICD-10-CM | POA: Diagnosis not present

## 2016-02-18 DIAGNOSIS — Z1231 Encounter for screening mammogram for malignant neoplasm of breast: Secondary | ICD-10-CM | POA: Diagnosis not present

## 2016-02-18 DIAGNOSIS — Z01419 Encounter for gynecological examination (general) (routine) without abnormal findings: Secondary | ICD-10-CM | POA: Diagnosis not present

## 2016-02-18 DIAGNOSIS — R232 Flushing: Secondary | ICD-10-CM | POA: Diagnosis not present

## 2016-02-19 DIAGNOSIS — F331 Major depressive disorder, recurrent, moderate: Secondary | ICD-10-CM | POA: Diagnosis not present

## 2016-02-19 DIAGNOSIS — F410 Panic disorder [episodic paroxysmal anxiety] without agoraphobia: Secondary | ICD-10-CM | POA: Diagnosis not present

## 2016-02-22 DIAGNOSIS — Z713 Dietary counseling and surveillance: Secondary | ICD-10-CM | POA: Diagnosis not present

## 2016-02-24 ENCOUNTER — Ambulatory Visit: Payer: BLUE CROSS/BLUE SHIELD | Admitting: Cardiovascular Disease

## 2016-03-16 DIAGNOSIS — R2242 Localized swelling, mass and lump, left lower limb: Secondary | ICD-10-CM | POA: Diagnosis not present

## 2016-04-10 ENCOUNTER — Other Ambulatory Visit: Payer: Self-pay | Admitting: Family

## 2016-04-10 NOTE — Telephone Encounter (Signed)
Last filled:  08/23/15 Amt: 30,5 Last OV:  01/07/16 Next Appt: no appt scheduled Med filled x 30 days.  Pt needs an appt.

## 2016-04-12 ENCOUNTER — Other Ambulatory Visit: Payer: Self-pay | Admitting: Family

## 2016-04-12 MED ORDER — LOSARTAN POTASSIUM 100 MG PO TABS: 100.0000 mg | ORAL_TABLET | Freq: Every day | ORAL | 0 refills | Status: DC

## 2016-04-22 ENCOUNTER — Other Ambulatory Visit: Payer: Self-pay | Admitting: Family

## 2016-04-26 DIAGNOSIS — Z713 Dietary counseling and surveillance: Secondary | ICD-10-CM | POA: Diagnosis not present

## 2016-05-24 DIAGNOSIS — K080 Exfoliation of teeth due to systemic causes: Secondary | ICD-10-CM | POA: Diagnosis not present

## 2016-05-25 DIAGNOSIS — K080 Exfoliation of teeth due to systemic causes: Secondary | ICD-10-CM | POA: Diagnosis not present

## 2016-05-26 ENCOUNTER — Encounter: Payer: Self-pay | Admitting: Family

## 2016-05-26 ENCOUNTER — Telehealth: Payer: Self-pay | Admitting: Family

## 2016-05-26 ENCOUNTER — Ambulatory Visit (HOSPITAL_BASED_OUTPATIENT_CLINIC_OR_DEPARTMENT_OTHER)
Admission: RE | Admit: 2016-05-26 | Discharge: 2016-05-26 | Disposition: A | Discharge status: Home / Self Care | Payer: BLUE CROSS/BLUE SHIELD | Source: Ambulatory Visit | Attending: Family | Admitting: Family

## 2016-05-26 ENCOUNTER — Ambulatory Visit (INDEPENDENT_AMBULATORY_CARE_PROVIDER_SITE_OTHER): Payer: BLUE CROSS/BLUE SHIELD | Admitting: Family

## 2016-05-26 VITALS — BP 136/98 | HR 89 | Temp 98.6°F | Resp 18 | Ht 65.0 in | Wt 185.2 lb

## 2016-05-26 DIAGNOSIS — R5383 Other fatigue: Secondary | ICD-10-CM | POA: Diagnosis not present

## 2016-05-26 DIAGNOSIS — F329 Major depressive disorder, single episode, unspecified: Secondary | ICD-10-CM

## 2016-05-26 DIAGNOSIS — Z23 Encounter for immunization: Secondary | ICD-10-CM | POA: Diagnosis not present

## 2016-05-26 DIAGNOSIS — I10 Essential (primary) hypertension: Secondary | ICD-10-CM

## 2016-05-26 DIAGNOSIS — R739 Hyperglycemia, unspecified: Secondary | ICD-10-CM

## 2016-05-26 DIAGNOSIS — Z114 Encounter for screening for human immunodeficiency virus [HIV]: Secondary | ICD-10-CM | POA: Diagnosis not present

## 2016-05-26 DIAGNOSIS — F32A Depression, unspecified: Secondary | ICD-10-CM

## 2016-05-26 DIAGNOSIS — F419 Anxiety disorder, unspecified: Secondary | ICD-10-CM

## 2016-05-26 DIAGNOSIS — G4733 Obstructive sleep apnea (adult) (pediatric): Secondary | ICD-10-CM

## 2016-05-26 DIAGNOSIS — E785 Hyperlipidemia, unspecified: Secondary | ICD-10-CM | POA: Diagnosis not present

## 2016-05-26 DIAGNOSIS — E559 Vitamin D deficiency, unspecified: Secondary | ICD-10-CM

## 2016-05-26 DIAGNOSIS — E042 Nontoxic multinodular goiter: Secondary | ICD-10-CM

## 2016-05-26 DIAGNOSIS — E538 Deficiency of other specified B group vitamins: Secondary | ICD-10-CM | POA: Diagnosis not present

## 2016-05-26 DIAGNOSIS — F418 Other specified anxiety disorders: Secondary | ICD-10-CM

## 2016-05-26 DIAGNOSIS — E119 Type 2 diabetes mellitus without complications: Secondary | ICD-10-CM

## 2016-05-26 LAB — BASIC METABOLIC PANEL
BUN: 12 mg/dL (ref 6–23)
CALCIUM: 9.4 mg/dL (ref 8.4–10.5)
CO2: 26 mEq/L (ref 19–32)
CREATININE: 0.85 mg/dL (ref 0.40–1.20)
Chloride: 107 mEq/L (ref 96–112)
GFR: 92.33 mL/min (ref >60.00)
Glucose, Bld: 86 mg/dL (ref 70–99)
Potassium: 4 mEq/L (ref 3.5–5.1)
Sodium: 142 mEq/L (ref 135–145)

## 2016-05-26 LAB — CBC WITH DIFFERENTIAL/PLATELET
Basophils Absolute: 0 10*3/uL (ref 0.0–0.1)
Basophils Relative: 0.2 % (ref 0.0–3.0)
Eosinophils Absolute: 0.1 10*3/uL (ref 0.0–0.7)
Eosinophils Relative: 1.5 % (ref 0.0–5.0)
HEMATOCRIT: 40.6 % (ref 36.0–46.0)
HEMOGLOBIN: 13.9 g/dL (ref 12.0–15.0)
LYMPHS ABS: 2.2 10*3/uL (ref 0.7–4.0)
Lymphocytes Relative: 32.5 % (ref 12.0–46.0)
MCHC: 34.2 g/dL (ref 30.0–36.0)
MCV: 83.6 fl (ref 78.0–100.0)
Monocytes Absolute: 0.4 10*3/uL (ref 0.1–1.0)
Monocytes Relative: 6.5 % (ref 3.0–12.0)
NEUTROS ABS: 4.1 10*3/uL (ref 1.4–7.7)
Neutrophils Relative %: 59.3 % (ref 43.0–77.0)
Platelets: 215 10*3/uL (ref 150.0–400.0)
RBC: 4.86 Mil/uL (ref 3.87–5.11)
RDW: 14.1 % (ref 11.5–15.5)
WBC: 6.9 10*3/uL (ref 4.0–10.5)

## 2016-05-26 LAB — LIPID PANEL
CHOL/HDL RATIO: 5
Cholesterol: 190 mg/dL (ref 0–200)
HDL: 39.2 mg/dL (ref >39.00)
LDL CALC: 131 mg/dL — AB (ref 0–99)
NonHDL: 151.08
TRIGLYCERIDES: 101 mg/dL (ref 0.0–149.0)
VLDL: 20.2 mg/dL (ref 0.0–40.0)

## 2016-05-26 LAB — HEMOGLOBIN A1C: Hgb A1c MFr Bld: 5.6 % (ref 4.6–6.5)

## 2016-05-26 LAB — VITAMIN B12: Vitamin B-12: 421 pg/mL (ref 211–911)

## 2016-05-26 LAB — TSH: TSH: 0.8 u[IU]/mL (ref 0.35–4.50)

## 2016-05-26 MED ORDER — CLONIDINE HCL 0.1 MG PO TABS: 0.1000 mg | ORAL_TABLET | Freq: Two times a day (BID) | ORAL | 3 refills | Status: DC

## 2016-05-26 NOTE — Patient Instructions (Addendum)
Please complete lab work prior to leaving.   Begin clonidine twice daily for blood pressure.

## 2016-05-26 NOTE — Telephone Encounter (Signed)
See my chart message

## 2016-05-26 NOTE — Progress Notes (Signed)
Subjective:    Patient ID: Michele [name], female    DOB: 03-Oct-1970, 45 y.o.   MRN: [id number]  HPI   Pt presents today for follow up. Will be starting a new job Monday.   HTN-  Reports that she had dizziness on hydralazine so she stopped.   BP Readings from Last 3 Encounters:  05/26/16 (!) 136/98  01/07/16 (!) 140/100  12/01/15 (!) 150/110   Hyperglycemia-  Lab Results  Component Value Date   HGBA1C 5.9 10/06/2015   HGBA1C 5.5 05/25/2015   HGBA1C 5.6 01/04/2015   Lab Results  Component Value Date   MICROALBUR <0.7 05/25/2015   LDLCALC 112 (H) 05/25/2015   CREATININE 0.77 09/15/2015   Anxiety/depression- she is following with Dr. Toy Care. Pt is still on prozac and cymbalta.    OSA- walking 5 miles a day. She uses CPAP every day, but wakes up and CPAP is on the floor.     B12 deficiencyWas receiving b12 from Dr. Trula Ore (neurology).  She has not been back in some time.   Review of Systems See HPI  Past Medical History:  Diagnosis Date  . Anemia    iron deficient, microcytic, hypochromic  . Anxiety   . Chest pain    atypical  . Depression   . Diabetes mellitus without complication ([place])   . Fatigue   . Fatty liver 08/15/2013  . Fibroids    uterine  . Hypertension   . Migraines   . 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 Posey Pronto at [facility]- S/p FNA March/april of two dominant nodules- benign.  Following for annual thyroid US with Dr. Posey Pronto.    . Palpitations    recurrent  . 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 Tachycardia    unspecified     Social History   Social History  . Marital status: Married    Spouse name: [name]  . Number of children: 2  . Years of education: N/A   Occupational History  . 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 Smoking status: Never Smoker  . Smokeless tobacco: Never Used     Comment: never used tobacco  . Alcohol use Yes     Comment: Once a month  . Drug use: No  .  Sexual activity: Yes    Birth control/ protection: None, Surgical   Other Topics Concern  . 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 APPENDECTOMY  03/2009  . BIOPSY THYROID  November 28, 2009   Dr. Posey Pronto- endo  . DILATION AND CURETTAGE OF UTERUS  05/21/2011   Procedure: DILATATION AND CURETTAGE (D&C);  Surgeon: Elveria Royals;  Location: [facility];  Service: Gynecology;  Laterality: N/A;  dilitation and currettage/endometrial currettings  . TUBAL LIGATION  1997  . TUBOPLASTY / TUBOTUBAL ANASTOMOSIS  2005    Family History  Problem Relation Age of Onset  . Hypertension Mother   . Cancer Mother 23    breast  . Cancer Father     colon  . Stroke Brother     handicapped due to complications from spinal meningitis  . Seizures Brother   . Asthma Son   .  Arthritis Maternal Grandmother   . Hypertension Maternal Grandmother   . Stroke Maternal Grandfather     Allergies  Allergen Reactions  . Amlodipine Shortness Of Breath, Anxiety, Palpitations and Other (See Comments)  . Bee Venom Anaphylaxis  . Bupropion Shortness Of Breath, Anxiety, Palpitations and Other (See Comments)  . Hydralazine     dizziness  . Imitrex [Sumatriptan] Nausea Only  . Oxycodone Nausea Only    Current Outpatient Prescriptions on File Prior to Visit  Medication Sig Dispense Refill  . ALPRAZolam (XANAX) 1 MG tablet Take 1 mg by mouth as needed. For anxiety    . cyanocobalamin (,VITAMIN B-12,) 1000 MCG/ML injection Inject 1 mL into the muscle every 30 (thirty) days.    Marland Kitchen EPIPEN 2-PAK 0.3 MG/0.3ML SOAJ injection daily as needed (FOR ALLERGY).     Marland Kitchen ibuprofen (ADVIL,MOTRIN) 800 MG tablet Take 800 mg by mouth 3 (three) times daily.  0  . levalbuterol (XOPENEX HFA) 45 MCG/ACT inhaler Inhale 2 puffs into  the lungs every 6 (six) hours as needed for wheezing. 1 Inhaler 5  . Linaclotide (LINZESS) 290 MCG CAPS capsule Take 290 mcg by mouth daily.    Marland Kitchen losartan (COZAAR) 100 MG tablet Take 1 tablet (100 mg total) by mouth daily. 30 tablet 0  . spironolactone (ALDACTONE) 25 MG tablet TAKE ONE TABLET BY MOUTH ONCE DAILY 30 tablet 0  . [DISCONTINUED] buPROPion (WELLBUTRIN XL) 150 MG 24 hr tablet Take 2 tablets (300 mg total) by mouth daily. 60 tablet 1   No current facility-administered medications on file prior to visit.     BP (!) 136/98 (BP Location: Left Arm, Cuff Size: Normal)   Pulse 89   Temp 98.6 F (37 C) (Oral)   Resp 18   Ht 5\' 5"  (1.651 m)   Wt 185 lb 3.2 oz (84 kg)   LMP 06/11/2013   SpO2 100% Comment: room air  BMI 30.82 kg/m       Objective:   Physical Exam  Constitutional: She is oriented to person, place, and time. She appears well-developed and well-nourished.  Cardiovascular: Normal rate, regular rhythm and normal heart sounds.   No murmur heard. Pulmonary/Chest: Effort normal and breath sounds normal. No respiratory distress. She has no wheezes.  Musculoskeletal: She exhibits no edema.  Neurological: She is alert and oriented to person, place, and time.  Psychiatric: She has a normal mood and affect. Her behavior is normal. Judgment and thought content normal.          Assessment & Plan:  Flu shot today b12 deficiency- obtain b12 level.   Thyroid nodules- will check a follow up thyroid ultrasound.   Flu shot today.

## 2016-05-26 NOTE — Assessment & Plan Note (Signed)
Encouraged pt to set an alarm on her phone for mid night and replace cpap if it is off.

## 2016-05-26 NOTE — Progress Notes (Signed)
Pre visit review using our clinic review tool, if applicable. No additional management support is needed unless otherwise documented below in the visit note. 

## 2016-05-26 NOTE — Assessment & Plan Note (Signed)
Mood seems the best it has been in a long time.  Management per psychiatry (Dr. Toy Care).

## 2016-05-26 NOTE — Assessment & Plan Note (Signed)
Not on supplement, check follow up level.

## 2016-05-26 NOTE — Assessment & Plan Note (Signed)
Clinically stable. Encouraged patient to continue her regular exercise. Obtain A1C.

## 2016-05-26 NOTE — Assessment & Plan Note (Signed)
Uncontrolled, perhaps due to suboptimal use of her CPAP. Will check cbc and tsh.

## 2016-05-26 NOTE — Assessment & Plan Note (Signed)
Uncontrolled. Will continue current meds and add clonidine 0.1mg  bid.

## 2016-05-27 LAB — HIV ANTIBODY (ROUTINE TESTING W REFLEX): HIV: NONREACTIVE

## 2016-05-29 ENCOUNTER — Telehealth: Payer: Self-pay | Admitting: Family

## 2016-05-29 DIAGNOSIS — E041 Nontoxic single thyroid nodule: Secondary | ICD-10-CM

## 2016-05-29 NOTE — Telephone Encounter (Signed)
Please let pt know that I reviewed her [name] and it shows multiple thyroid nodules.  I would like her to meet with endocrinology for further evaluation.

## 2016-05-30 NOTE — Telephone Encounter (Signed)
Notified pt and she is agreeable to proceed with referral. 

## 2016-06-26 ENCOUNTER — Ambulatory Visit: Payer: BLUE CROSS/BLUE SHIELD | Admitting: Family

## 2016-07-03 ENCOUNTER — Ambulatory Visit (INDEPENDENT_AMBULATORY_CARE_PROVIDER_SITE_OTHER): Payer: BLUE CROSS/BLUE SHIELD | Admitting: Family

## 2016-07-03 ENCOUNTER — Other Ambulatory Visit: Payer: Self-pay | Admitting: Family

## 2016-07-03 ENCOUNTER — Encounter: Payer: Self-pay | Admitting: Family

## 2016-07-03 VITALS — BP 150/100 | HR 89 | Temp 98.1°F | Resp 16 | Ht 65.0 in | Wt 190.2 lb

## 2016-07-03 DIAGNOSIS — J020 Streptococcal pharyngitis: Secondary | ICD-10-CM | POA: Diagnosis not present

## 2016-07-03 DIAGNOSIS — I10 Essential (primary) hypertension: Secondary | ICD-10-CM | POA: Diagnosis not present

## 2016-07-03 DIAGNOSIS — J029 Acute pharyngitis, unspecified: Secondary | ICD-10-CM | POA: Diagnosis not present

## 2016-07-03 LAB — POCT RAPID STREP A (OFFICE): RAPID STREP A SCREEN: POSITIVE — AB

## 2016-07-03 MED ORDER — SPIRONOLACTONE 25 MG PO TABS: 25.0000 mg | ORAL_TABLET | Freq: Every day | ORAL | 5 refills | Status: DC

## 2016-07-03 MED ORDER — BENZONATATE 100 MG PO CAPS: 100.0000 mg | ORAL_CAPSULE | Freq: Three times a day (TID) | ORAL | 0 refills | Status: DC | PRN

## 2016-07-03 MED ORDER — CLONIDINE HCL 0.2 MG PO TABS: 0.2000 mg | ORAL_TABLET | Freq: Two times a day (BID) | ORAL | 2 refills | Status: DC

## 2016-07-03 MED ORDER — AMOXICILLIN 500 MG PO CAPS: 500.0000 mg | ORAL_CAPSULE | Freq: Three times a day (TID) | ORAL | 0 refills | Status: DC

## 2016-07-03 MED ORDER — LOSARTAN POTASSIUM 100 MG PO TABS: 100.0000 mg | ORAL_TABLET | Freq: Every day | ORAL | 5 refills | Status: DC

## 2016-07-03 NOTE — Patient Instructions (Signed)
For strep throat, begin amoxicillin.  For cough you may use tessalon perles. Call if new/worsening symptoms or fever >101.

## 2016-07-03 NOTE — Progress Notes (Signed)
Pre visit review using our clinic review tool, if applicable. No additional management support is needed unless otherwise documented below in the visit note. 

## 2016-07-03 NOTE — Assessment & Plan Note (Signed)
Uncontrolled. Will increase clonidine from 0.1 mg twice daily to 0.2mg  bid.  Continue losartan and aldactone.

## 2016-07-03 NOTE — Progress Notes (Signed)
Subjective:    Patient ID: Michele [name], female    DOB: 1971-05-25, 45 y.o.   MRN: [id number]  HPI  [name] is a 45 yr old female who presents today for follow up.  1) HTN- currently maintained on losartan, clonidine, aldactone. Reports good med compliance.  BP Readings from Last 3 Encounters:  07/03/16 (!) 150/100  05/26/16 (!) 136/98  01/07/16 (!) 140/100   Other c/o include:  Muscle stiffness- notes + stiffness in her legs/shoulders x 2 days. Increased fatigue, cough and sore throat.   Review of Systems See HPI  Past Medical History:  Diagnosis Date  . Anemia    iron deficient, microcytic, hypochromic  . Anxiety   . Chest pain    atypical  . Depression   . Diabetes mellitus without complication ([place])   . Fatigue   . Fatty liver 08/15/2013  . Fibroids    uterine  . Hypertension   . Migraines   . 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 Posey Pronto at [facility]- S/p FNA March/april of two dominant nodules- benign.  Following for annual thyroid US with Dr. Posey Pronto.    . Palpitations    recurrent  . 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 Tachycardia    unspecified     Social History   Social History  . Marital status: Married    Spouse name: Michele [name]  . Number of children: 2  . Years of education: N/A   Occupational History  . 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 Smoking status: Never Smoker  . Smokeless tobacco: Never Used     Comment: never used tobacco  . Alcohol use Yes     Comment: Once a month  . Drug use: No  . Sexual activity: Yes    Birth control/ protection: None, Surgical   Other Topics Concern  . 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 APPENDECTOMY  03/2009  .  BIOPSY THYROID  November 28, 2009   Dr. Posey Pronto- endo  . DILATION AND CURETTAGE OF UTERUS  05/21/2011   Procedure: DILATATION AND CURETTAGE (D&C);  Surgeon: Elveria Royals;  Location: [facility];  Service: Gynecology;  Laterality: N/A;  dilitation and currettage/endometrial currettings  . TUBAL LIGATION  1997  . TUBOPLASTY / TUBOTUBAL ANASTOMOSIS  2005    Family History  Problem Relation Age of Onset  . Hypertension Mother   . Cancer Mother 1    breast  . Cancer Father     colon  . Stroke Brother     handicapped due to complications from spinal meningitis  . Seizures Brother   . Asthma Son   . Arthritis Maternal Grandmother   . Hypertension Maternal Grandmother   . Stroke Maternal Grandfather     Allergies  Allergen Reactions  . Amlodipine Shortness Of Breath, Anxiety, Palpitations and Other (See Comments)  . Bee Venom Anaphylaxis  . Bupropion Shortness Of Breath, Anxiety, Palpitations and Other (See Comments)  . Hydralazine     dizziness  . Imitrex [Sumatriptan] Nausea Only  . Oxycodone Nausea Only    Current Outpatient Prescriptions on File Prior to Visit  Medication Sig  Dispense Refill  . ALPRAZolam (XANAX) 1 MG tablet Take 1 mg by mouth as needed. For anxiety    . cloNIDine (CATAPRES) 0.1 MG tablet Take 1 tablet (0.1 mg total) by mouth 2 (two) times daily. 60 tablet 3  . cyanocobalamin (,VITAMIN B-12,) 1000 MCG/ML injection Inject 1 mL into the muscle every 30 (thirty) days.    Marland Kitchen EPIPEN 2-PAK 0.3 MG/0.3ML SOAJ injection daily as needed (FOR ALLERGY).     Marland Kitchen ibuprofen (ADVIL,MOTRIN) 800 MG tablet Take 800 mg by mouth 3 (three) times daily.  0  . levalbuterol (XOPENEX HFA) 45 MCG/ACT inhaler Inhale 2 puffs into the lungs every 6 (six) hours as needed for wheezing. 1 Inhaler 5  . Linaclotide (LINZESS) 290 MCG CAPS capsule Take 290 mcg by mouth daily.    Marland Kitchen losartan (COZAAR) 100 MG tablet Take 1 tablet (100 mg total) by mouth daily. 30 tablet 0  . spironolactone (ALDACTONE) 25 MG  tablet TAKE ONE TABLET BY MOUTH ONCE DAILY 30 tablet 0  . [DISCONTINUED] buPROPion (WELLBUTRIN XL) 150 MG 24 hr tablet Take 2 tablets (300 mg total) by mouth daily. 60 tablet 1   No current facility-administered medications on file prior to visit.     BP (!) 150/100   Pulse 89   Temp 98.1 F (36.7 C) (Oral)   Resp 16   Ht 5\' 5"  (1.651 m)   Wt 190 lb 3.2 oz (86.3 kg)   LMP 06/11/2013   SpO2 98% Comment: room air  BMI 31.65 kg/m       Objective:   Physical Exam  Constitutional: She is oriented to person, place, and time. She appears well-developed and well-nourished.  HENT:  Head: Normocephalic and atraumatic.  Right Ear: Tympanic membrane and ear canal normal.  Left Ear: Tympanic membrane and ear canal normal.  Mouth/Throat: Posterior oropharyngeal edema and posterior oropharyngeal erythema present. No oropharyngeal exudate or tonsillar abscesses.  Cardiovascular: Normal rate, regular rhythm and normal heart sounds.   No murmur heard. Pulmonary/Chest: Effort normal and breath sounds normal. No respiratory distress. She has no wheezes.  Neurological: She is alert and oriented to person, place, and time.  Skin: Skin is warm and dry.  Psychiatric: She has a normal mood and affect. Her behavior is normal. Judgment and thought content normal.          Assessment & Plan:  Strep pharyngitis-  Rapid strep +. Will rx with amoxicillin. Add tessalon prn cough. Advised pt to follow up if new/worsening symptoms or if symptoms do not improve. This is likely cause for her muscle pain and fatigue.

## 2016-07-18 ENCOUNTER — Encounter: Payer: Self-pay | Admitting: Family

## 2016-07-31 ENCOUNTER — Ambulatory Visit: Payer: Self-pay | Admitting: Family

## 2016-07-31 ENCOUNTER — Telehealth: Payer: Self-pay | Admitting: Family

## 2016-07-31 NOTE — Telephone Encounter (Signed)
Patient cancelled her 5:15pm follow up appointment for today, patient [facility] to 08/07/16. Charge or no charge

## 2016-07-31 NOTE — Telephone Encounter (Signed)
No charge please. 

## 2016-08-07 ENCOUNTER — Telehealth: Payer: Self-pay | Admitting: Family

## 2016-08-07 ENCOUNTER — Ambulatory Visit: Payer: Self-pay | Admitting: Family

## 2016-08-07 DIAGNOSIS — Z780 Asymptomatic menopausal state: Secondary | ICD-10-CM | POA: Diagnosis not present

## 2016-08-07 NOTE — Telephone Encounter (Signed)
Please contact pt and let her know that we will not charge today but that in the future same day cancellations are treated as no-shows and patients are typically charged a no-show fee.Marland Kitchen

## 2016-08-07 NOTE — Telephone Encounter (Signed)
Patient informed, voice understanding

## 2016-08-07 NOTE — Telephone Encounter (Signed)
Patient cancelled 11am appointment due to not starting new medication, patient [facility] to 08/28/16, charge or no charge

## 2016-08-14 ENCOUNTER — Ambulatory Visit (INDEPENDENT_AMBULATORY_CARE_PROVIDER_SITE_OTHER): Payer: BLUE CROSS/BLUE SHIELD | Admitting: Family

## 2016-08-14 ENCOUNTER — Encounter: Payer: Self-pay | Admitting: Family

## 2016-08-14 ENCOUNTER — Ambulatory Visit (HOSPITAL_BASED_OUTPATIENT_CLINIC_OR_DEPARTMENT_OTHER)
Admission: RE | Admit: 2016-08-14 | Discharge: 2016-08-14 | Disposition: A | Discharge status: Home / Self Care | Payer: BLUE CROSS/BLUE SHIELD | Source: Ambulatory Visit | Attending: Family | Admitting: Family

## 2016-08-14 VITALS — BP 156/100 | HR 107 | Temp 98.3°F | Resp 18 | Ht 65.0 in | Wt 197.2 lb

## 2016-08-14 DIAGNOSIS — J019 Acute sinusitis, unspecified: Secondary | ICD-10-CM

## 2016-08-14 DIAGNOSIS — J029 Acute pharyngitis, unspecified: Secondary | ICD-10-CM | POA: Diagnosis not present

## 2016-08-14 DIAGNOSIS — H6692 Otitis media, unspecified, left ear: Secondary | ICD-10-CM

## 2016-08-14 DIAGNOSIS — R059 Cough, unspecified: Secondary | ICD-10-CM

## 2016-08-14 DIAGNOSIS — R05 Cough: Secondary | ICD-10-CM | POA: Diagnosis not present

## 2016-08-14 DIAGNOSIS — I159 Secondary hypertension, unspecified: Secondary | ICD-10-CM

## 2016-08-14 DIAGNOSIS — J020 Streptococcal pharyngitis: Secondary | ICD-10-CM | POA: Diagnosis not present

## 2016-08-14 LAB — POCT RAPID STREP A (OFFICE): RAPID STREP A SCREEN: POSITIVE — AB

## 2016-08-14 MED ORDER — GUAIFENESIN-CODEINE 100-10 MG/5ML PO SYRP: 5.0000 mL | ORAL_SOLUTION | Freq: Three times a day (TID) | ORAL | 0 refills | Status: DC | PRN

## 2016-08-14 MED ORDER — AMOXICILLIN-POT CLAVULANATE 875-125 MG PO TABS: 1.0000 | ORAL_TABLET | Freq: Two times a day (BID) | ORAL | 0 refills | Status: DC

## 2016-08-14 NOTE — Progress Notes (Signed)
Subjective:    Patient ID: Michele [name], female    DOB: [birth year], 45 y.o.   MRN: [id number]  HPI  [name] is a 45 yr old female who presents today with some wheezing. + aching/fatigue.  Was treated for strep throat 10/23.    Hot flashes/sweating- notes that GYN has prescribed her paxil and a combo patch but she has not yet started them.     Review of Systems   Past Medical History:  Diagnosis Date  . Anemia    iron deficient, microcytic, hypochromic  . Anxiety   . Chest pain    atypical  . Depression   . Diabetes mellitus without complication ([place])   . Fatigue   . Fatty liver 08/15/2013  . Fibroids    uterine  . Hypertension   . Migraines   . 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 Posey Pronto at [facility]- S/p FNA March/april of two dominant nodules- benign.  Following for annual thyroid US with Dr. Posey Pronto.    . Palpitations    recurrent  . 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 Tachycardia    unspecified     Social History   Social History  . Marital status: Married    Spouse name: [name]  . Number of children: 2  . Years of education: N/A   Occupational History  . 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 Smoking status: Never Smoker  . Smokeless tobacco: Never Used     Comment: never used tobacco  . Alcohol use Yes     Comment: Once a month  . Drug use: No  . Sexual activity: Yes    Birth control/ protection: None, Surgical   Other Topics Concern  . 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 APPENDECTOMY  03/2009  . BIOPSY THYROID  November 28, 2009   Dr. Posey Pronto- endo  . DILATION AND CURETTAGE OF UTERUS  05/21/2011   Procedure: DILATATION AND CURETTAGE (D&C);  Surgeon: Elveria Royals;  Location: [facility];  Service: Gynecology;  Laterality: N/A;  dilitation and currettage/endometrial currettings  . TUBAL LIGATION  1997  . TUBOPLASTY / TUBOTUBAL ANASTOMOSIS  2005    Family History  Problem Relation Age of Onset  . Hypertension Mother   . Cancer Mother 30    breast  . Cancer Father     colon  . Stroke Brother     handicapped due to complications from spinal meningitis  . Seizures Brother   . Asthma Son   . Arthritis Maternal Grandmother   . Hypertension Maternal Grandmother   . Stroke Maternal Grandfather     Allergies  Allergen Reactions  . Amlodipine Shortness Of Breath, Anxiety, Palpitations and Other (See Comments)  . Bee Venom Anaphylaxis  . Bupropion Shortness Of Breath, Anxiety, Palpitations and Other (See Comments)  . Hydralazine     dizziness  . Imitrex [Sumatriptan] Nausea Only  . Oxycodone Nausea Only    Current Outpatient Prescriptions on File Prior to Visit  Medication Sig Dispense Refill  . ALPRAZolam (XANAX) 1 MG tablet Take 1 mg by mouth as needed. For anxiety    .  cloNIDine (CATAPRES) 0.2 MG tablet Take 1 tablet (0.2 mg total) by mouth 2 (two) times daily. 30 tablet 2  . cyanocobalamin (,VITAMIN B-12,) 1000 MCG/ML injection Inject 1 mL into the muscle every 30 (thirty) days.    Marland Kitchen EPIPEN 2-PAK 0.3 MG/0.3ML SOAJ injection daily as needed (FOR ALLERGY).     Marland Kitchen ibuprofen (ADVIL,MOTRIN) 800 MG tablet Take 800 mg by mouth 3 (three) times daily.  0  . levalbuterol (XOPENEX HFA) 45 MCG/ACT inhaler Inhale 2 puffs into the lungs every 6 (six) hours as needed for wheezing. 1 Inhaler 5  . losartan (COZAAR) 100 MG tablet Take 1 tablet (100 mg total) by mouth daily. 30 tablet 5  . spironolactone (ALDACTONE) 25 MG tablet Take 1 tablet (25 mg total) by mouth daily. 30 tablet 5  . [DISCONTINUED] buPROPion (WELLBUTRIN XL) 150 MG 24 hr tablet Take 2 tablets (300 mg total) by mouth daily. 60 tablet 1   No current facility-administered medications on file  prior to visit.     BP (!) 156/100 (BP Location: Left Arm, Cuff Size: Large)   Pulse (!) 107   Temp 98.3 F (36.8 C) (Oral)   Resp 18   Ht 5\' 5"  (1.651 m)   Wt 197 lb 3.2 oz (89.4 kg)   LMP 06/11/2013   SpO2 97% Comment: room air  BMI 32.82 kg/m        Objective:   Physical Exam  Constitutional: She is oriented to person, place, and time. She appears well-developed and well-nourished.  HENT:  Right Ear: Tympanic membrane and ear canal normal.  Left Ear: Ear canal normal. Tympanic membrane is erythematous.  Nose: Right sinus exhibits maxillary sinus tenderness and frontal sinus tenderness. Left sinus exhibits maxillary sinus tenderness and frontal sinus tenderness.  Mouth/Throat: Posterior oropharyngeal erythema present. No oropharyngeal exudate or posterior oropharyngeal edema.  Cardiovascular: Normal rate, regular rhythm and normal heart sounds.   No murmur heard. Pulmonary/Chest: Effort normal and breath sounds normal. No respiratory distress. She has no wheezes.  Musculoskeletal: She exhibits no edema.  Neurological: She is alert and oriented to person, place, and time.  Psychiatric: She has a normal mood and affect. Her behavior is normal. Judgment and thought content normal.          Assessment & Plan:  Strep pharyngitis/L otitis Media/Bilateral sinusitis- rapid strep is +/  rx with augmentin, cheratussin prn cough, obtain cxr to rule out pneumonia. Pt advised to call if new/worsening symptoms or if symptoms do not improve.    HTN- uncontrolled- only taking clonidine once daily- advised pt to increase to bid and follow up with RN in 2 weeks for nurse visit bp check.

## 2016-08-14 NOTE — Progress Notes (Signed)
Pre visit review using our clinic review tool, if applicable. No additional management support is needed unless otherwise documented below in the visit note. 

## 2016-08-14 NOTE — Patient Instructions (Addendum)
Please complete chest x ray on the first floor. Begin augmentin for strep throat/ear infection/sinus infection. You may use cheratussin cough syrup if new/worsening symptoms or if symptoms are not improved in 2-3 days.

## 2016-08-28 ENCOUNTER — Ambulatory Visit: Payer: Self-pay | Admitting: Family

## 2016-11-03 ENCOUNTER — Telehealth: Payer: Self-pay | Admitting: Family

## 2016-11-03 ENCOUNTER — Ambulatory Visit (INDEPENDENT_AMBULATORY_CARE_PROVIDER_SITE_OTHER): Payer: BLUE CROSS/BLUE SHIELD | Admitting: Family

## 2016-11-03 ENCOUNTER — Encounter: Payer: Self-pay | Admitting: Family

## 2016-11-03 VITALS — BP 124/91 | HR 84 | Temp 98.9°F | Resp 18 | Ht 65.0 in | Wt 203.4 lb

## 2016-11-03 DIAGNOSIS — R5383 Other fatigue: Secondary | ICD-10-CM | POA: Diagnosis not present

## 2016-11-03 LAB — CBC WITH DIFFERENTIAL/PLATELET
BASOS ABS: 0 10*3/uL (ref 0.0–0.1)
BASOS PCT: 0.2 % (ref 0.0–3.0)
Eosinophils Absolute: 0.1 10*3/uL (ref 0.0–0.7)
Eosinophils Relative: 0.9 % (ref 0.0–5.0)
HEMATOCRIT: 39.8 % (ref 36.0–46.0)
Hemoglobin: 13.4 g/dL (ref 12.0–15.0)
LYMPHS ABS: 2.2 10*3/uL (ref 0.7–4.0)
LYMPHS PCT: 34 % (ref 12.0–46.0)
MCHC: 33.7 g/dL (ref 30.0–36.0)
MCV: 84.6 fl (ref 78.0–100.0)
MONO ABS: 0.5 10*3/uL (ref 0.1–1.0)
Monocytes Relative: 7.5 % (ref 3.0–12.0)
NEUTROS PCT: 57.4 % (ref 43.0–77.0)
Neutro Abs: 3.8 10*3/uL (ref 1.4–7.7)
Platelets: 213 10*3/uL (ref 150.0–400.0)
RBC: 4.7 Mil/uL (ref 3.87–5.11)
RDW: 13.9 % (ref 11.5–15.5)
WBC: 6.6 10*3/uL (ref 4.0–10.5)

## 2016-11-03 LAB — COMPREHENSIVE METABOLIC PANEL
ALT: 23 U/L (ref 0–35)
AST: 21 U/L (ref 0–37)
Albumin: 4.2 g/dL (ref 3.5–5.2)
Alkaline Phosphatase: 67 U/L (ref 39–117)
BILIRUBIN TOTAL: 0.5 mg/dL (ref 0.2–1.2)
BUN: 10 mg/dL (ref 6–23)
CALCIUM: 9.4 mg/dL (ref 8.4–10.5)
CHLORIDE: 107 meq/L (ref 96–112)
CO2: 27 meq/L (ref 19–32)
Creatinine, Ser: 0.8 mg/dL (ref 0.40–1.20)
GFR: 98.83 mL/min (ref >60.00)
GLUCOSE: 105 mg/dL — AB (ref 70–99)
Potassium: 4.4 mEq/L (ref 3.5–5.1)
SODIUM: 141 meq/L (ref 135–145)
Total Protein: 7 g/dL (ref 6.0–8.3)

## 2016-11-03 LAB — ROCKY MTN SPOTTED FVR ABS PNL(IGG+IGM)
RMSF IgG: NOT DETECTED
RMSF IgM: NOT DETECTED

## 2016-11-03 LAB — SEDIMENTATION RATE: SED RATE: 5 mm/h (ref 0–20)

## 2016-11-03 LAB — TSH: TSH: 1.1 u[IU]/mL (ref 0.35–4.50)

## 2016-11-03 NOTE — Telephone Encounter (Signed)
Could you please contact [facility] for a download on her CPAP.

## 2016-11-03 NOTE — Progress Notes (Signed)
Pre visit review using our clinic review tool, if applicable. No additional management support is needed unless otherwise documented below in the visit note. 

## 2016-11-03 NOTE — Assessment & Plan Note (Signed)
Suspect multifactorial. Her GYN added paxil (she was already on prozac and cymbalta).  Her fatigue seems to worsen around that time. Advised pt to d/c prozac.  She scored 19 on her PHQ-9. I do think that there is a depression component but I think that her fatigue issues are contributing to her score.  Will request a download from her  CPAP to make sure that this is functioning properly for her and she will also arrange a follow up with Dr. Elsworth Soho to discuss a newer CPAP machine. Will check labs as below including a screen for RMSF, RA, ANA and Lyme titer due to joint pain.

## 2016-11-03 NOTE — Patient Instructions (Addendum)
Stop Paxil.  Complete lab work prior to leaving. Please schedule follow up with Dr. Elsworth Soho.

## 2016-11-03 NOTE — Progress Notes (Signed)
Subjective:    Patient ID: Michele [name], female    DOB: [birth year], 46 y.o.   MRN: [id number]  HPI  [name] is a 46 yr old female who presents today with several concerns.  She reports worsening fatigue which has been present x 1-2 months.  Reports that her body feels heavy.  Has pain in arms, legs, elbows. Reports that she feels "achey all the time." Feels "like I have a virus."    Reports "sweating episodes" throughout the day.  Reports that her GYN gave her an estradiol patch but she has been scared to try it.   Anxiety/depression- followed by psychiatry, Dr. Toy Care. Maintained on prozac and cymbalta. Notes that she is "so tired" that she falls a lot.  She reports that she is compliant with her CPAP. Her GYN added    Review of Systems See HPI  Past Medical History:  Diagnosis Date  . Anemia    iron deficient, microcytic, hypochromic  . Anxiety   . Chest pain    atypical  . Depression   . Diabetes mellitus without complication ([place])   . Fatigue   . Fatty liver 08/15/2013  . Fibroids    uterine  . Hypertension   . Migraines   . 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 Posey Pronto at [facility]- S/p FNA March/april of two dominant nodules- benign.  Following for annual thyroid US with Dr. Posey Pronto.    . Palpitations    recurrent  . 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 Tachycardia    unspecified     Social History   Social History  . Marital status: Married    Spouse name: [name]  . Number of children: 2  . Years of education: N/A   Occupational History  . 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 Smoking status: Never Smoker  . Smokeless tobacco: Never Used     Comment: never used tobacco  . Alcohol use Yes     Comment: Once a month  . Drug use: No  . Sexual activity: Yes    Birth control/ protection: None, Surgical   Other Topics Concern  . 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 APPENDECTOMY  03/2009  . BIOPSY THYROID  November 28, 2009   Dr. Posey Pronto- endo  . DILATION AND CURETTAGE OF UTERUS  05/21/2011   Procedure: DILATATION AND CURETTAGE (D&C);  Surgeon: Elveria Royals;  Location: [facility];  Service: Gynecology;  Laterality: N/A;  dilitation and currettage/endometrial currettings  . TUBAL LIGATION  1997  . TUBOPLASTY / TUBOTUBAL ANASTOMOSIS  2005    Family History  Problem Relation Age of Onset  . Hypertension Mother   . Cancer Mother 47    breast  . Cancer Father     colon  . Stroke Brother     handicapped due to complications from spinal meningitis  . Seizures Brother   . Asthma Son   . Arthritis Maternal Grandmother   . Hypertension Maternal Grandmother   . Stroke Maternal Grandfather     Allergies  Allergen Reactions  . Amlodipine Shortness Of Breath, Anxiety, Palpitations and Other (See Comments)  .  Bee Venom Anaphylaxis  . Bupropion Shortness Of Breath, Anxiety, Palpitations and Other (See Comments)  . Hydralazine     dizziness  . Imitrex [Sumatriptan] Nausea Only  . Oxycodone Nausea Only    Current Outpatient Prescriptions on File Prior to Visit  Medication Sig Dispense Refill  . ALPRAZolam (XANAX) 1 MG tablet Take 1 mg by mouth as needed. For anxiety    . cloNIDine (CATAPRES) 0.2 MG tablet Take 1 tablet (0.2 mg total) by mouth 2 (two) times daily. 30 tablet 2  . cyanocobalamin (,VITAMIN B-12,) 1000 MCG/ML injection Inject 1 mL into the muscle every 30 (thirty) days.    Marland Kitchen EPIPEN 2-PAK 0.3 MG/0.3ML SOAJ injection daily as needed (FOR ALLERGY).     Marland Kitchen ibuprofen (ADVIL,MOTRIN) 800 MG tablet Take 800 mg by mouth 3 (three) times daily.  0  . losartan (COZAAR) 100 MG tablet Take 1 tablet (100 mg total) by mouth daily. 30 tablet 5  . Plecanatide  (TRULANCE) 3 MG TABS Take 1 tablet by mouth daily.    Marland Kitchen spironolactone (ALDACTONE) 25 MG tablet Take 1 tablet (25 mg total) by mouth daily. 30 tablet 5  . [DISCONTINUED] buPROPion (WELLBUTRIN XL) 150 MG 24 hr tablet Take 2 tablets (300 mg total) by mouth daily. 60 tablet 1   No current facility-administered medications on file prior to visit.     BP (!) 124/91 (BP Location: Right Arm, Cuff Size: Large)   Pulse 84   Temp 98.9 F (37.2 C) (Oral)   Resp 18   Ht 5\' 5"  (1.651 m)   Wt 203 lb 6.4 oz (92.3 kg)   LMP 06/11/2013   SpO2 100% Comment: room air  BMI 33.85 kg/m       Objective:   Physical Exam  Constitutional: She is oriented to person, place, and time. She appears well-developed and well-nourished.  Cardiovascular: Normal rate, regular rhythm and normal heart sounds.   No murmur heard. Pulmonary/Chest: Effort normal and breath sounds normal. No respiratory distress. She has no wheezes.  Musculoskeletal: She exhibits no edema.  No joint swelling is noted.   Neurological: She is alert and oriented to person, place, and time.  Psychiatric: She has a normal mood and affect. Her behavior is normal. Judgment and thought content normal.          Assessment & Plan:

## 2016-11-03 NOTE — Telephone Encounter (Signed)
Spoke with [name] at [facility], [phone] and he states he is not able to locate Michele [name] in their system. Spoke with Michele [name]. She will verify CPAP supply company and will call or email [name] back the information.

## 2016-11-06 LAB — ANA: Anti Nuclear Antibody(ANA): NEGATIVE

## 2016-11-06 LAB — LYME AB/WESTERN BLOT REFLEX

## 2016-11-06 LAB — RHEUMATOID FACTOR: Rhuematoid fact SerPl-aCnc: <14 IU/mL (ref <14)

## 2016-11-08 NOTE — Telephone Encounter (Signed)
Sent mychart message to pt requesting name of CPAP provider.

## 2016-11-19 NOTE — Telephone Encounter (Signed)
I don't think she read her mychart. Would you mind following up with patient please?

## 2016-11-20 ENCOUNTER — Ambulatory Visit: Payer: Self-pay | Admitting: Family

## 2016-11-20 NOTE — Telephone Encounter (Signed)
Spoke with pt. She states she has not found out who her CPAP supplier was previously. Will call us back when she is able to determine who she was originally set up with.

## 2016-11-27 ENCOUNTER — Ambulatory Visit: Payer: Self-pay | Admitting: Family

## 2016-11-29 ENCOUNTER — Ambulatory Visit: Payer: BLUE CROSS/BLUE SHIELD | Admitting: Family

## 2016-11-29 ENCOUNTER — Telehealth: Payer: Self-pay

## 2016-11-29 NOTE — Telephone Encounter (Signed)
Pt signed record release form [facility] Fax to [phone] awaiting records.

## 2016-12-26 ENCOUNTER — Other Ambulatory Visit: Payer: Self-pay | Admitting: Gastroenterology

## 2016-12-26 ENCOUNTER — Ambulatory Visit
Admission: RE | Admit: 2016-12-26 | Discharge: 2016-12-26 | Disposition: A | Discharge status: Home / Self Care | Payer: BLUE CROSS/BLUE SHIELD | Source: Ambulatory Visit | Attending: Gastroenterology | Admitting: Gastroenterology

## 2016-12-26 DIAGNOSIS — K760 Fatty (change of) liver, not elsewhere classified: Secondary | ICD-10-CM | POA: Diagnosis not present

## 2016-12-26 DIAGNOSIS — R1084 Generalized abdominal pain: Secondary | ICD-10-CM

## 2016-12-26 DIAGNOSIS — R635 Abnormal weight gain: Secondary | ICD-10-CM | POA: Diagnosis not present

## 2016-12-26 DIAGNOSIS — R140 Abdominal distension (gaseous): Secondary | ICD-10-CM | POA: Diagnosis not present

## 2016-12-26 DIAGNOSIS — R1033 Periumbilical pain: Secondary | ICD-10-CM | POA: Diagnosis not present

## 2016-12-26 DIAGNOSIS — R109 Unspecified abdominal pain: Secondary | ICD-10-CM | POA: Diagnosis not present

## 2016-12-26 MED ORDER — IOPAMIDOL (ISOVUE-300) INJECTION 61%
100.0000 mL | Freq: Once | INTRAVENOUS | Status: AC | PRN
  Administered 2016-12-26: 100 mL via INTRAVENOUS

## 2017-01-31 DIAGNOSIS — M7541 Impingement syndrome of right shoulder: Secondary | ICD-10-CM | POA: Diagnosis not present

## 2017-01-31 DIAGNOSIS — R52 Pain, unspecified: Secondary | ICD-10-CM | POA: Diagnosis not present

## 2017-02-07 DIAGNOSIS — F410 Panic disorder [episodic paroxysmal anxiety] without agoraphobia: Secondary | ICD-10-CM | POA: Diagnosis not present

## 2017-02-09 LAB — HM PAP SMEAR: HM Pap smear: NORMAL

## 2017-02-09 LAB — HM MAMMOGRAPHY: HM Mammogram: NORMAL (ref 0–4)

## 2017-02-15 LAB — HM DIABETES EYE EXAM

## 2017-02-28 ENCOUNTER — Encounter: Payer: Self-pay | Admitting: Family

## 2017-02-28 NOTE — Progress Notes (Signed)
02/15/17

## 2017-03-06 ENCOUNTER — Other Ambulatory Visit: Payer: Self-pay | Admitting: Family

## 2017-03-07 ENCOUNTER — Ambulatory Visit: Payer: BLUE CROSS/BLUE SHIELD | Admitting: Family

## 2017-03-07 ENCOUNTER — Other Ambulatory Visit: Payer: Self-pay | Admitting: *Deleted

## 2017-03-07 MED ORDER — SPIRONOLACTONE 25 MG PO TABS: 25.0000 mg | ORAL_TABLET | Freq: Every day | ORAL | 5 refills | Status: DC

## 2017-03-07 MED ORDER — LOSARTAN POTASSIUM 100 MG PO TABS: 100.0000 mg | ORAL_TABLET | Freq: Every day | ORAL | 5 refills | Status: DC

## 2017-03-07 NOTE — Progress Notes (Signed)
Faxed refill request received from [facility] for Losartan & Spironolactone Last filled by MD on 07/03/16, #30x5 Last AEX - 11/03/16 Refill sent per [facility] refill protocol/SLS

## 2017-03-26 DIAGNOSIS — R5383 Other fatigue: Secondary | ICD-10-CM | POA: Diagnosis not present

## 2017-03-26 DIAGNOSIS — Z780 Asymptomatic menopausal state: Secondary | ICD-10-CM | POA: Diagnosis not present

## 2017-03-26 DIAGNOSIS — Z01419 Encounter for gynecological examination (general) (routine) without abnormal findings: Secondary | ICD-10-CM | POA: Diagnosis not present

## 2017-03-26 DIAGNOSIS — Z1231 Encounter for screening mammogram for malignant neoplasm of breast: Secondary | ICD-10-CM | POA: Diagnosis not present

## 2017-03-26 DIAGNOSIS — E161 Other hypoglycemia: Secondary | ICD-10-CM | POA: Diagnosis not present

## 2017-03-26 DIAGNOSIS — Z6835 Body mass index (BMI) 35.0-35.9, adult: Secondary | ICD-10-CM | POA: Diagnosis not present

## 2017-04-04 ENCOUNTER — Ambulatory Visit (INDEPENDENT_AMBULATORY_CARE_PROVIDER_SITE_OTHER): Payer: BLUE CROSS/BLUE SHIELD | Admitting: Family

## 2017-04-04 VITALS — BP 128/90 | HR 88 | Temp 98.6°F | Ht 65.0 in | Wt 207.2 lb

## 2017-04-04 DIAGNOSIS — L299 Pruritus, unspecified: Secondary | ICD-10-CM | POA: Diagnosis not present

## 2017-04-04 DIAGNOSIS — I10 Essential (primary) hypertension: Secondary | ICD-10-CM | POA: Diagnosis not present

## 2017-04-04 DIAGNOSIS — R5382 Chronic fatigue, unspecified: Secondary | ICD-10-CM

## 2017-04-04 LAB — BASIC METABOLIC PANEL
BUN: 10 mg/dL (ref 6–23)
CALCIUM: 9.5 mg/dL (ref 8.4–10.5)
CHLORIDE: 106 meq/L (ref 96–112)
CO2: 30 meq/L (ref 19–32)
Creatinine, Ser: 0.88 mg/dL (ref 0.40–1.20)
GFR: 88.38 mL/min (ref >60.00)
GLUCOSE: 103 mg/dL — AB (ref 70–99)
Potassium: 4.5 mEq/L (ref 3.5–5.1)
SODIUM: 140 meq/L (ref 135–145)

## 2017-04-04 MED ORDER — SPIRONOLACTONE 25 MG PO TABS: 25.0000 mg | ORAL_TABLET | Freq: Every day | ORAL | Status: DC

## 2017-04-04 MED ORDER — LORATADINE 10 MG PO TABS: 10.0000 mg | ORAL_TABLET | Freq: Every day | ORAL | 11 refills | Status: DC

## 2017-04-04 MED ORDER — SPIRONOLACTONE 50 MG PO TABS: 50.0000 mg | ORAL_TABLET | Freq: Every day | ORAL | 2 refills | Status: DC

## 2017-04-04 MED ORDER — HYDRALAZINE HCL 25 MG PO TABS: 25.0000 mg | ORAL_TABLET | Freq: Two times a day (BID) | ORAL | 2 refills | Status: DC

## 2017-04-04 NOTE — Patient Instructions (Signed)
Please add hydralazine twice daily. Let me know if you have worsening dizziness.

## 2017-04-04 NOTE — Progress Notes (Signed)
Subjective:    Patient ID: Michele [name], female    DOB: Sep 15, 1970, 46 y.o.   MRN: [id number]  HPI   Patient is a 46 year old female who presents today with two concerns:    Elevated blood pressure- reports that she has been having elevated blood pressure readings at home for the last 2-3 weeks. Home Bp readings 140/98. Reports bp was elevated recently at her GYN office as well.   Current blood pressure medications include losartan 100 mg by mouth daily, and Aldactone 25 mg by mouth daily. Reports that her GYN added metformin and vit D. She has ongoing fatigue.  She has seen Dr. Toy Care re: her fatigue. She increased her cymbalta to 60mg  about 1 month ago. Denies change with this .  BP Readings from Last 3 Encounters:  04/04/17 128/90  11/03/16 (!) 124/91  08/14/16 (!) 156/100   Pruritis-  also reports one-week history of itching on her legs and arms. She denies associated rash.  Woke her up in the middle of the night.    She is seeing GI (Dr. Collene Mares). Reports some right upper abdominal pain. Was told that she has a cyst on her liver.   OSA- she has mild OSA. She is not using CPAP.    Lab Results  Component Value Date   HGBA1C 5.6 05/26/2016   Review of Systems    see HPI  Past Medical History:  Diagnosis Date  . Anemia    iron deficient, microcytic, hypochromic  . Anxiety   . Chest pain    atypical  . Depression   . Diabetes mellitus without complication ([place])   . Fatigue   . Fatty liver 08/15/2013  . Fibroids    uterine  . Hypertension   . Migraines   . 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 Posey Pronto at [facility]- S/p FNA March/april of two dominant nodules- benign.  Following for annual thyroid US with Dr. Posey Pronto.    . Palpitations    recurrent  . 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 Tachycardia    unspecified     Social History   Social History  . Marital status: Married    Spouse name: [name]  . Number of children: 2    . Years of education: N/A   Occupational History  . 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 Smoking status: Never Smoker  . Smokeless tobacco: Never Used     Comment: never used tobacco  . Alcohol use Yes     Comment: Once a month  . Drug use: No  . Sexual activity: Yes    Birth control/ protection: None, Surgical   Other Topics Concern  . 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 APPENDECTOMY  03/2009  . BIOPSY THYROID  November 28, 2009   Dr. Posey Pronto- endo  . DILATION AND CURETTAGE OF UTERUS  05/21/2011   Procedure: DILATATION AND CURETTAGE (D&C);  Surgeon: Elveria Royals;  Location: [facility];  Service: Gynecology;  Laterality: N/A;  dilitation and currettage/endometrial currettings  . TUBAL LIGATION  1997  . TUBOPLASTY / TUBOTUBAL ANASTOMOSIS  2005    Family History  Problem Relation Age of Onset  . Hypertension Mother   . Cancer Mother 56       breast  . Cancer Father        colon  . Stroke Brother        handicapped due to complications from spinal meningitis  . Seizures Brother   . Asthma Son   . Arthritis Maternal Grandmother   . Hypertension Maternal Grandmother   . Stroke Maternal Grandfather     Allergies  Allergen Reactions  . Amlodipine Shortness Of Breath, Anxiety, Palpitations and Other (See Comments)  . Bee Venom Anaphylaxis  . Bupropion Shortness Of Breath, Anxiety, Palpitations and Other (See Comments)  . Hydralazine     dizziness  . Imitrex [Sumatriptan] Nausea Only  . Oxycodone Nausea Only    Current Outpatient Prescriptions on File Prior to Visit  Medication Sig Dispense Refill  . ALPRAZolam (XANAX) 1 MG tablet Take 1 mg by mouth as needed. For anxiety    . cyanocobalamin (,VITAMIN B-12,) 1000 MCG/ML injection Inject 1  mL into the muscle every 30 (thirty) days.    . DULoxetine (CYMBALTA) 30 MG capsule Take 1 capsule by mouth daily.    Marland Kitchen EPIPEN 2-PAK 0.3 MG/0.3ML SOAJ injection daily as needed (FOR ALLERGY).     Marland Kitchen FLUoxetine (PROZAC) 40 MG capsule Take 1 capsule by mouth daily.    Marland Kitchen ibuprofen (ADVIL,MOTRIN) 800 MG tablet Take 800 mg by mouth 3 (three) times daily.  0  . losartan (COZAAR) 100 MG tablet Take 1 tablet (100 mg total) by mouth daily. 30 tablet 5  . Plecanatide (TRULANCE) 3 MG TABS Take 1 tablet by mouth daily.    Marland Kitchen spironolactone (ALDACTONE) 25 MG tablet Take 1 tablet (25 mg total) by mouth daily. 30 tablet 5  . [DISCONTINUED] buPROPion (WELLBUTRIN XL) 150 MG 24 hr tablet Take 2 tablets (300 mg total) by mouth daily. 60 tablet 1   No current facility-administered medications on file prior to visit.     BP 128/90   Pulse 88   Temp 98.6 F (37 C) (Oral)   Ht 5\' 5"  (1.651 m)   Wt 207 lb 3.2 oz (94 kg)   LMP 06/11/2013   SpO2 98%   BMI 34.48 kg/m    Objective:   Physical Exam  Constitutional: She is oriented to person, place, and time. She appears well-developed and well-nourished.  HENT:  Head: Normocephalic and atraumatic.  Cardiovascular: Normal rate, regular rhythm and normal heart sounds.   No murmur heard. Pulmonary/Chest: Effort normal and breath sounds normal. No respiratory distress. She has no wheezes.  Neurological: She is alert and oriented to person, place, and time.  Psychiatric: She has a normal mood and affect. Her behavior is normal. Judgment and thought content normal.  skin:  No skin rash is noted.         Assessment & Plan:  HTN- Home blood pressure readings have been elevated. Her diastolic blood pressure is mildly elevated here today in the office. She has tried hydralazine in the past. She does not remember having any issues with this medication other than difficulty remembering the 3 times daily dosing. I do see a note in Epic about possible dizziness with  this medication. She is willing to retry the medication and will let me know if she has recurrent dizziness.  Chronic fatigue-this is a long-standing issue for her.  Her sleep apnea is felt to be very mild and she did not have improvement in  her symptoms on CPAP. Her psychiatrist has recently adjusted her Cymbalta without improvement in her symptoms.  We discussed the possibility of a trial of Nuvigil. We will see how she does from a blood pressure standpoint at her follow-up visit in 2 weeks. Blood pressure is stable, will consider a trial of Nuvigil with close monitoring of her blood pressure.  Itching-could be mild allergic reaction-I see no rash date there is no tongue or lip swelling. I advised patient to try adding a nondrowsy antihistamine such as Claritin 10 mg once daily. In addition I've advised her to make sure that all of her hygiene products and detergents are fragrance free. She is advised let us know if symptoms worsen or fail to improve.

## 2017-04-25 ENCOUNTER — Ambulatory Visit: Payer: Self-pay | Admitting: Family

## 2017-06-21 DIAGNOSIS — H02821 Cysts of right upper eyelid: Secondary | ICD-10-CM | POA: Diagnosis not present

## 2017-06-21 DIAGNOSIS — H04123 Dry eye syndrome of bilateral lacrimal glands: Secondary | ICD-10-CM | POA: Diagnosis not present

## 2017-06-21 DIAGNOSIS — H21233 Degeneration of iris (pigmentary), bilateral: Secondary | ICD-10-CM | POA: Diagnosis not present

## 2017-06-21 DIAGNOSIS — H02822 Cysts of right lower eyelid: Secondary | ICD-10-CM | POA: Diagnosis not present

## 2017-07-04 ENCOUNTER — Encounter: Payer: Self-pay | Admitting: Family

## 2017-07-04 ENCOUNTER — Ambulatory Visit (INDEPENDENT_AMBULATORY_CARE_PROVIDER_SITE_OTHER): Payer: BLUE CROSS/BLUE SHIELD | Admitting: Family

## 2017-07-04 VITALS — BP 122/89 | HR 75 | Temp 98.3°F | Resp 16 | Ht 65.0 in | Wt 205.8 lb

## 2017-07-04 DIAGNOSIS — E118 Type 2 diabetes mellitus with unspecified complications: Secondary | ICD-10-CM

## 2017-07-04 DIAGNOSIS — G4733 Obstructive sleep apnea (adult) (pediatric): Secondary | ICD-10-CM

## 2017-07-04 DIAGNOSIS — I10 Essential (primary) hypertension: Secondary | ICD-10-CM | POA: Diagnosis not present

## 2017-07-04 DIAGNOSIS — F329 Major depressive disorder, single episode, unspecified: Secondary | ICD-10-CM

## 2017-07-04 DIAGNOSIS — F419 Anxiety disorder, unspecified: Secondary | ICD-10-CM | POA: Diagnosis not present

## 2017-07-04 DIAGNOSIS — F32A Depression, unspecified: Secondary | ICD-10-CM

## 2017-07-04 LAB — BASIC METABOLIC PANEL
BUN: 13 mg/dL (ref 6–23)
CALCIUM: 9.7 mg/dL (ref 8.4–10.5)
CHLORIDE: 103 meq/L (ref 96–112)
CO2: 29 meq/L (ref 19–32)
Creatinine, Ser: 0.83 mg/dL (ref 0.40–1.20)
GFR: 94.45 mL/min (ref >60.00)
Glucose, Bld: 103 mg/dL — ABNORMAL HIGH (ref 70–99)
Potassium: 4.8 mEq/L (ref 3.5–5.1)
Sodium: 140 mEq/L (ref 135–145)

## 2017-07-04 LAB — MICROALBUMIN / CREATININE URINE RATIO
Creatinine,U: 178.7 mg/dL
Microalb Creat Ratio: 0.5 mg/g (ref 0.0–30.0)
Microalb, Ur: 0.9 mg/dL (ref 0.0–1.9)

## 2017-07-04 LAB — HEMOGLOBIN A1C: HEMOGLOBIN A1C: 6.2 % (ref 4.6–6.5)

## 2017-07-04 NOTE — Assessment & Plan Note (Signed)
Not using CPAP.  I have advised her to arrange follow-up with Dr. Elsworth Soho.  We discussed possibility of using a ramp setting due to her complaints about the air pressure.

## 2017-07-04 NOTE — Assessment & Plan Note (Signed)
Blood pressure looks great today in the office.  She has not taking hydralazine and I have advised her to okay to remain off of hydralazine for now.  Continue to monitor blood pressure.

## 2017-07-04 NOTE — Progress Notes (Signed)
Subjective:    Patient ID: Michele [name], female    DOB: Jun 20, 1971, 46 y.o.   MRN: [id number]  HPI  HTN-patient notes elevated blood pressures at home.  Systolic pressures are ranging 664-403 and diastolic pressures are ranging 90-100s.  She did not start hydralazine.Had a high pressure at a weight loss clinic in [place].   BP Readings from Last 3 Encounters:  07/04/17 122/89  04/04/17 128/90  11/03/16 (!) 124/91   DM2-  Lab Results  Component Value Date   HGBA1C 5.6 05/26/2016   HGBA1C 5.9 10/06/2015   HGBA1C 5.5 05/25/2015   Lab Results  Component Value Date   MICROALBUR <0.7 05/25/2015   LDLCALC 131 (H) 05/26/2016   CREATININE 0.88 04/04/2017   Sleep apnea- can't tolerate CPAP.  She felt like the air was blowing too hard and it caused her anxiety.  She reports continued low energy.  She has started cycling.  Depression/anxiety- continues to work with Dr. Toy Care.    Review of Systems    see HPI  Past Medical History:  Diagnosis Date  . Anemia    iron deficient, microcytic, hypochromic  . Anxiety   . Chest pain    atypical  . Depression   . Diabetes mellitus without complication ([place])   . Fatigue   . Fatty liver 08/15/2013  . Fibroids    uterine  . Hypertension   . Migraines   . 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 Posey Pronto at [facility]- S/p FNA March/april of two dominant nodules- benign.  Following for annual thyroid US with Dr. Posey Pronto.    . Palpitations    recurrent  . 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 Tachycardia    unspecified     Social History   Social History  . Marital status: Married    Spouse name: [name]  . Number of children: 2  . Years of education: N/A   Occupational History  . 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 Smoking status: Never Smoker  . Smokeless tobacco: Never Used     Comment: never used tobacco  . Alcohol use Yes     Comment: Once a month  . Drug use: No  .  Sexual activity: Yes    Birth control/ protection: None, Surgical   Other Topics Concern  . 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 APPENDECTOMY  03/2009  . BIOPSY THYROID  November 28, 2009   Dr. Posey Pronto- endo  . DILATION AND CURETTAGE OF UTERUS  05/21/2011   Procedure: DILATATION AND CURETTAGE (D&C);  Surgeon: Elveria Royals;  Location: [facility];  Service: Gynecology;  Laterality: N/A;  dilitation and currettage/endometrial currettings  . TUBAL LIGATION  1997  . TUBOPLASTY / TUBOTUBAL ANASTOMOSIS  2005    Family History  Problem Relation Age of Onset  . Hypertension Mother   . Cancer Mother 24       breast  . Cancer Father        colon  . Stroke Brother        handicapped due to complications from spinal meningitis  . Seizures Brother   . Asthma Son   .  Arthritis Maternal Grandmother   . Hypertension Maternal Grandmother   . Stroke Maternal Grandfather     Allergies  Allergen Reactions  . Amlodipine Shortness Of Breath, Anxiety, Palpitations and Other (See Comments)  . Bee Venom Anaphylaxis  . Bupropion Shortness Of Breath, Anxiety, Palpitations and Other (See Comments)  . Clonidine Derivatives     dizziness  . Hydralazine     dizziness  . Imitrex [Sumatriptan] Nausea Only  . Oxycodone Nausea Only    Current Outpatient Prescriptions on File Prior to Visit  Medication Sig Dispense Refill  . ALPRAZolam (XANAX) 1 MG tablet Take 1 mg by mouth as needed. For anxiety    . cyanocobalamin (,VITAMIN B-12,) 1000 MCG/ML injection Inject 1 mL into the muscle every 30 (thirty) days.    . DULoxetine (CYMBALTA) 30 MG capsule Take 1 capsule by mouth daily.    Marland Kitchen EPIPEN 2-PAK 0.3 MG/0.3ML SOAJ injection daily as needed (FOR ALLERGY).     . hydrALAZINE (APRESOLINE) 25 MG  tablet Take 1 tablet (25 mg total) by mouth 2 (two) times daily. 60 tablet 2  . ibuprofen (ADVIL,MOTRIN) 800 MG tablet Take 800 mg by mouth 3 (three) times daily.  0  . loratadine (CLARITIN) 10 MG tablet Take 1 tablet (10 mg total) by mouth daily. 30 tablet 11  . losartan (COZAAR) 100 MG tablet Take 1 tablet (100 mg total) by mouth daily. 30 tablet 5  . metFORMIN (GLUCOPHAGE) 500 MG tablet Take 1 tablet by mouth daily.    Marland Kitchen Plecanatide (TRULANCE) 3 MG TABS Take 1 tablet by mouth daily.    Marland Kitchen spironolactone (ALDACTONE) 25 MG tablet Take 1 tablet (25 mg total) by mouth daily.    . Vitamin D, Ergocalciferol, (DRISDOL) 50000 units CAPS capsule Take 1 capsule by mouth once a week.    Marland Kitchen FLUoxetine (PROZAC) 40 MG capsule Take 1 capsule by mouth daily.    . [DISCONTINUED] buPROPion (WELLBUTRIN XL) 150 MG 24 hr tablet Take 2 tablets (300 mg total) by mouth daily. 60 tablet 1   No current facility-administered medications on file prior to visit.     BP 122/89 (BP Location: Left Arm, Cuff Size: Large)   Pulse 75   Temp 98.3 F (36.8 C) (Oral)   Resp 16   Ht 5\' 5"  (1.651 m)   Wt 205 lb 12.8 oz (93.4 kg)   LMP 06/11/2013   SpO2 97%   BMI 34.25 kg/m    Objective:   Physical Exam  Constitutional: She is oriented to person, place, and time. She appears well-developed and well-nourished.  HENT:  Head: Normocephalic and atraumatic.  Cardiovascular: Normal rate, regular rhythm and normal heart sounds.   No murmur heard. Pulmonary/Chest: Effort normal and breath sounds normal. No respiratory distress. She has no wheezes.  Musculoskeletal: She exhibits no edema.  Neurological: She is alert and oriented to person, place, and time.  Psychiatric: She has a normal mood and affect. Her behavior is normal. Judgment and thought content normal.          Assessment & Plan:

## 2017-07-04 NOTE — Assessment & Plan Note (Signed)
Will obtain follow-up A1c, urine microalbumin and basic metabolic panel today.

## 2017-07-04 NOTE — Patient Instructions (Addendum)
Try calling [facility] to arrange counseling [phone]  Alternatively you can call [facility] to schedule counseling.  [phone] Please schedule follow up with Dr. Elsworth Soho.   Complete lab work prior to leaving.

## 2017-07-04 NOTE — Assessment & Plan Note (Signed)
This is being managed by psychiatry.  She continues to have some issues especially with anxiety.  She is not seeing a Social worker.  I have given her some numbers to call to look into counseling.

## 2017-07-05 ENCOUNTER — Encounter: Payer: Self-pay | Admitting: Family

## 2017-10-04 ENCOUNTER — Encounter: Payer: Self-pay | Admitting: Family

## 2017-10-04 ENCOUNTER — Ambulatory Visit: Payer: BLUE CROSS/BLUE SHIELD | Admitting: Family

## 2017-10-04 ENCOUNTER — Telehealth: Payer: Self-pay | Admitting: Family

## 2017-10-04 VITALS — BP 120/79 | HR 90 | Temp 98.7°F | Resp 16 | Ht 65.0 in | Wt 204.0 lb

## 2017-10-04 DIAGNOSIS — E559 Vitamin D deficiency, unspecified: Secondary | ICD-10-CM

## 2017-10-04 DIAGNOSIS — S91309A Unspecified open wound, unspecified foot, initial encounter: Secondary | ICD-10-CM

## 2017-10-04 DIAGNOSIS — E785 Hyperlipidemia, unspecified: Secondary | ICD-10-CM

## 2017-10-04 DIAGNOSIS — E119 Type 2 diabetes mellitus without complications: Secondary | ICD-10-CM

## 2017-10-04 DIAGNOSIS — G4733 Obstructive sleep apnea (adult) (pediatric): Secondary | ICD-10-CM | POA: Diagnosis not present

## 2017-10-04 DIAGNOSIS — I10 Essential (primary) hypertension: Secondary | ICD-10-CM

## 2017-10-04 LAB — COMPREHENSIVE METABOLIC PANEL
ALT: 20 U/L (ref 0–35)
AST: 20 U/L (ref 0–37)
Albumin: 4.6 g/dL (ref 3.5–5.2)
Alkaline Phosphatase: 69 U/L (ref 39–117)
BILIRUBIN TOTAL: 0.8 mg/dL (ref 0.2–1.2)
BUN: 14 mg/dL (ref 6–23)
CO2: 29 meq/L (ref 19–32)
Calcium: 9.8 mg/dL (ref 8.4–10.5)
Chloride: 104 mEq/L (ref 96–112)
Creatinine, Ser: 0.82 mg/dL (ref 0.40–1.20)
GFR: 95.68 mL/min (ref >60.00)
GLUCOSE: 113 mg/dL — AB (ref 70–99)
Potassium: 4.6 mEq/L (ref 3.5–5.1)
SODIUM: 139 meq/L (ref 135–145)
Total Protein: 7.9 g/dL (ref 6.0–8.3)

## 2017-10-04 LAB — LIPID PANEL
CHOL/HDL RATIO: 5
Cholesterol: 175 mg/dL (ref 0–200)
HDL: 36.6 mg/dL — AB (ref >39.00)
LDL CALC: 120 mg/dL — AB (ref 0–99)
NONHDL: 137.97
TRIGLYCERIDES: 92 mg/dL (ref 0.0–149.0)
VLDL: 18.4 mg/dL (ref 0.0–40.0)

## 2017-10-04 LAB — HEMOGLOBIN A1C: Hgb A1c MFr Bld: 5.9 % (ref 4.6–6.5)

## 2017-10-04 MED ORDER — VITAMIN D3 75 MCG (3000 UT) PO TABS: 1.0000 | ORAL_TABLET | Freq: Every day | ORAL | Status: DC

## 2017-10-04 MED ORDER — METFORMIN HCL 500 MG PO TABS: 500.0000 mg | ORAL_TABLET | Freq: Every day | ORAL | 1 refills | Status: DC

## 2017-10-04 MED ORDER — ATORVASTATIN CALCIUM 10 MG PO TABS
10.0000 mg | ORAL_TABLET | Freq: Every day | ORAL | 3 refills | Status: DC
Start: 2017-10-04 — End: 2018-06-17

## 2017-10-04 MED ORDER — SPIRONOLACTONE 25 MG PO TABS: 25.0000 mg | ORAL_TABLET | Freq: Every day | ORAL | Status: DC

## 2017-10-04 MED ORDER — LOSARTAN POTASSIUM 100 MG PO TABS: 100.0000 mg | ORAL_TABLET | Freq: Every day | ORAL | 1 refills | Status: DC

## 2017-10-04 NOTE — Telephone Encounter (Signed)
See mychart.  

## 2017-10-04 NOTE — Patient Instructions (Signed)
Please contact Dr. Elsworth Soho to arrange a follow up appointment to discuss cpap. Complete lab work prior to leaving.

## 2017-10-04 NOTE — Progress Notes (Signed)
Subjective:    Patient ID: Michele [name], female    DOB: [birth year], 47 y.o.   MRN: [id number]  HPI  [name] is a 47 yr old female who presents today for follow up.  1) DM2- only taking metformin once every 2 weeks.  Lab Results  Component Value Date   HGBA1C 6.2 07/04/2017   HGBA1C 5.6 05/26/2016   HGBA1C 5.9 10/06/2015   Lab Results  Component Value Date   MICROALBUR 0.9 07/04/2017   LDLCALC 131 (H) 05/26/2016   CREATININE 0.83 07/04/2017   2) HTN- she continues losartan and aldactone.  BP Readings from Last 3 Encounters:  10/04/17 120/79  07/04/17 122/89  04/04/17 128/90   3) OSA- not using cpap. Has not contacted Dr. Hubbard Hartshorn.   Wt Readings from Last 3 Encounters:  10/04/17 204 lb (92.5 kg)  07/04/17 205 lb 12.8 oz (93.4 kg)  04/04/17 207 lb 3.2 oz (94 kg)   She did take a weekly supplement but stopped 2 months ago.   She reports that she cut her  foot on the edge of her treadmill.  Reports area was sore but seems to be improving.  Review of Systems    see HPI  Past Medical History:  Diagnosis Date  . Anemia    iron deficient, microcytic, hypochromic  . Anxiety   . Chest pain    atypical  . Depression   . Diabetes mellitus without complication ([place])   . Fatigue   . Fatty liver 08/15/2013  . Fibroids    uterine  . Hypertension   . Migraines   . 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 Posey Pronto at [facility]- S/p FNA March/april of two dominant nodules- benign.  Following for annual thyroid US with Dr. Posey Pronto.    . Palpitations    recurrent  . 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 Tachycardia    unspecified     Social History   Socioeconomic History  . Marital status: Married    Spouse name: [name]  . Number of children: 2  . Years of education: Not on file  . Highest education level: Not on file  Social Needs  . Financial resource strain: Not on file  . Food insecurity - worry: Not on file  .  Food insecurity - inability: Not on file  . Transportation needs - medical: Not on file  . Transportation needs - non-medical: Not on file  Occupational History  . Occupation: CLAIMS AGENT    Employer: [employer]  Tobacco Use  . Smoking status: Never Smoker  . Smokeless tobacco: Never Used  . Tobacco comment: never used tobacco  Substance and Sexual Activity  . Alcohol use: Yes    Comment: Once a month  . Drug use: No  . Sexual activity: Yes    Birth control/protection: None, Surgical  Other Topics Concern  . 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 APPENDECTOMY  03/2009  . BIOPSY THYROID  November 28, 2009   Dr. Posey Pronto- endo  . DILATION AND CURETTAGE OF UTERUS  05/21/2011   Procedure: DILATATION AND CURETTAGE (D&C);  Surgeon: Elveria Royals;  Location: [facility];  Service: Gynecology;  Laterality: N/A;  dilitation and currettage/endometrial currettings  . TUBAL LIGATION  1997  . TUBOPLASTY / TUBOTUBAL ANASTOMOSIS  2005    Family History  Problem Relation Age of Onset  . Hypertension Mother   . Cancer Mother 75       breast  . Cancer Father        colon  . Stroke Brother        handicapped due to complications from spinal meningitis  . Seizures Brother   . Asthma Son   . Arthritis Maternal Grandmother   . Hypertension Maternal Grandmother   . Stroke Maternal Grandfather     Allergies  Allergen Reactions  . Amlodipine Shortness Of Breath, Anxiety, Palpitations and Other (See Comments)  . Bee Venom Anaphylaxis  . Bupropion Shortness Of Breath, Anxiety, Palpitations and Other (See Comments)  . Clonidine Derivatives     dizziness  . Hydralazine     dizziness  . Imitrex [Sumatriptan] Nausea Only  . Oxycodone Nausea Only    Current Outpatient Medications on File  Prior to Visit  Medication Sig Dispense Refill  . ALPRAZolam (XANAX) 1 MG tablet Take 1 mg by mouth as needed. For anxiety    . cyanocobalamin (,VITAMIN B-12,) 1000 MCG/ML injection Inject 1 mL into the muscle every 30 (thirty) days.    . DULoxetine (CYMBALTA) 30 MG capsule Take 1 capsule by mouth daily.    Marland Kitchen EPIPEN 2-PAK 0.3 MG/0.3ML SOAJ injection daily as needed (FOR ALLERGY).     Marland Kitchen FLUoxetine (PROZAC) 40 MG capsule Take 1 capsule by mouth daily.    Marland Kitchen ibuprofen (ADVIL,MOTRIN) 800 MG tablet Take 800 mg by mouth 3 (three) times daily.  0  . loratadine (CLARITIN) 10 MG tablet Take 1 tablet (10 mg total) by mouth daily. 30 tablet 11  . losartan (COZAAR) 100 MG tablet Take 1 tablet (100 mg total) by mouth daily. 30 tablet 5  . metFORMIN (GLUCOPHAGE) 500 MG tablet Take 1 tablet by mouth daily.    Marland Kitchen Plecanatide (TRULANCE) 3 MG TABS Take 1 tablet by mouth daily.    Marland Kitchen spironolactone (ALDACTONE) 25 MG tablet Take 1 tablet (25 mg total) by mouth daily.    . Vitamin D, Ergocalciferol, (DRISDOL) 50000 units CAPS capsule Take 1 capsule by mouth once a week.    . [DISCONTINUED] buPROPion (WELLBUTRIN XL) 150 MG 24 hr tablet Take 2 tablets (300 mg total) by mouth daily. 60 tablet 1   No current facility-administered medications on file prior to visit.     BP 120/79 (BP Location: Left Arm, Patient Position: Sitting, Cuff Size: Large)   Pulse 90   Temp 98.7 F (37.1 C) (Oral)   Resp 16   Wt 204 lb (92.5 kg)   LMP 06/11/2013   SpO2 99%   BMI 33.95 kg/m    Objective:   Physical Exam  Constitutional: She is oriented to person, place, and time. She appears well-developed and well-nourished.  Cardiovascular: Normal rate, regular rhythm and normal heart sounds.  No murmur heard. Pulmonary/Chest: Effort normal and breath sounds normal. No respiratory distress. She has no wheezes.  Musculoskeletal: She exhibits no edema.  Neurological: She is alert and oriented to person, place, and time.  Skin: Skin  is warm and dry.  Small cut noted on left heel.  Well approximated.  No significant erythema.  No drainage.  Psychiatric: She has a normal mood and affect. Her behavior is normal. Judgment and thought content normal.  Assessment & Plan:  Foot wound-appears to be healing.  Advised patient to apply antibiotic ointment twice daily, call if increased pain swelling redness or if it does not continue to improve.  Diabetes type 2-reinforce importance of compliance with metformin.  Will obtain A1c.  Vitamin D deficiency-check follow-up vitamin D level.  Advised patient to begin over-the-counter supplement 3000 units once daily.  Hyperlipidemia-check follow-up lipid panel.  Not currently on statin.  HTN- stable on current meds continue same.   Obstructive sleep apnea-not currently using CPAP.  I advised her to schedule follow-up with Dr. Elsworth Soho.

## 2017-10-07 LAB — VITAMIN D 1,25 DIHYDROXY
VITAMIN D 1, 25 (OH) TOTAL: 70 pg/mL (ref 18–72)
VITAMIN D3 1, 25 (OH): 48 pg/mL
Vitamin D2 1, 25 (OH)2: 22 pg/mL

## 2017-10-08 ENCOUNTER — Encounter: Payer: Self-pay | Admitting: Family

## 2017-10-12 DIAGNOSIS — F3342 Major depressive disorder, recurrent, in full remission: Secondary | ICD-10-CM | POA: Diagnosis not present

## 2017-10-12 DIAGNOSIS — F410 Panic disorder [episodic paroxysmal anxiety] without agoraphobia: Secondary | ICD-10-CM | POA: Diagnosis not present

## 2017-10-17 ENCOUNTER — Other Ambulatory Visit: Payer: Self-pay | Admitting: Family

## 2017-10-24 ENCOUNTER — Encounter: Payer: Self-pay | Admitting: Medical

## 2017-10-24 ENCOUNTER — Ambulatory Visit: Payer: BLUE CROSS/BLUE SHIELD | Admitting: Medical

## 2017-10-24 VITALS — BP 133/95 | HR 75 | Temp 98.8°F | Resp 16 | Ht 65.0 in | Wt 205.4 lb

## 2017-10-24 DIAGNOSIS — R059 Cough, unspecified: Secondary | ICD-10-CM

## 2017-10-24 DIAGNOSIS — J0100 Acute maxillary sinusitis, unspecified: Secondary | ICD-10-CM | POA: Diagnosis not present

## 2017-10-24 DIAGNOSIS — R05 Cough: Secondary | ICD-10-CM | POA: Diagnosis not present

## 2017-10-24 DIAGNOSIS — J029 Acute pharyngitis, unspecified: Secondary | ICD-10-CM | POA: Diagnosis not present

## 2017-10-24 LAB — POCT INFLUENZA A/B
INFLUENZA A, POC: NEGATIVE
INFLUENZA B, POC: NEGATIVE

## 2017-10-24 MED ORDER — FLUTICASONE PROPIONATE 50 MCG/ACT NA SUSP: 2.0000 | Freq: Every day | NASAL | 1 refills | Status: DC

## 2017-10-24 MED ORDER — AZITHROMYCIN 250 MG PO TABS: ORAL_TABLET | ORAL | 0 refills | Status: DC

## 2017-10-24 MED ORDER — HYDROCODONE-HOMATROPINE 5-1.5 MG/5ML PO SYRP: 5.0000 mL | ORAL_SOLUTION | Freq: Three times a day (TID) | ORAL | 0 refills | Status: DC | PRN

## 2017-10-24 NOTE — Patient Instructions (Addendum)
You appear to have a sinus infection with some concern for possible strep pharyngitis. I am prescribing azithromycin  antibiotic for the infection. To help with the nasal congestion I prescribed  flonase nasal steroid. For your associated cough, I prescribed cough medicine hycodan.  Rest, hydrate, tylenol for fever.  Follow up in 7 days or as needed.

## 2017-10-24 NOTE — Progress Notes (Signed)
   Subjective:    Patient ID: Michele [name], female    DOB: 1971/03/22, 47 y.o.   MRN: [id number]  HPI  She has ha, frontal sinus area, and severe dry cough. Pt does feel some pnd. She does feel moderate-severe fatigue. No diffuse body aches.  Notes ha and sinus pressure increase with cough.  Pt does not have wheezing. No ear pain.    Review of Systems  Constitutional: Positive for chills, fatigue and fever.       Maybe fever verses hot flash?  HENT: Positive for congestion, postnasal drip, sinus pressure, sinus pain, sneezing and sore throat.        St more noted when coughs.  Respiratory: Positive for cough. Negative for choking.   Cardiovascular: Negative for chest pain and palpitations.  Gastrointestinal: Negative for abdominal pain.  Musculoskeletal: Negative for back pain and myalgias.  Skin: Negative for wound.  Neurological: Negative for dizziness, seizures, weakness, numbness and headaches.  Hematological: Negative for adenopathy. Does not bruise/bleed easily.  Psychiatric/Behavioral: Negative for behavioral problems and confusion.       Objective:   Physical Exam  General  Mental Status - Alert. General Appearance - Well groomed. Not in acute distress.  Skin Rashes- No Rashes.  HEENT Head- Normal. Ear Auditory Canal - Left- Normal. Right - Normal.Tympanic Membrane- Left- Normal. Right- Normal. Eye Sclera/Conjunctiva- Left- Normal. Right- Normal. Nose & Sinuses Nasal Mucosa- Left-  Boggy and Congested. Right-  Boggy and  Congested.Bilateral maxillary and frontal sinus pressure. Mouth & Throat Lips: Upper Lip- Normal: no dryness, cracking, pallor, cyanosis, or vesicular eruption. Lower Lip-Normal: no dryness, cracking, pallor, cyanosis or vesicular eruption. Buccal Mucosa- Bilateral- No Aphthous ulcers. Oropharynx- No Discharge or Erythema. Tonsils: Characteristics- Bilateral- Moderate Erythema. Size/Enlargement- Bilateral- No enlargement.  Discharge- bilateral-None.  Neck Neck- Supple. No Masses.   Chest and Lung Exam Auscultation: Breath Sounds:-Clear even and unlabored.  Cardiovascular Auscultation:Rythm- Regular, rate and rhythm. Murmurs & Other Heart Sounds:Ausculatation of the heart reveal- No Murmurs.  Lymphatic Head & Neck General Head & Neck Lymphatics: Bilateral: Description- No Localized lymphadenopathy.       Assessment & Plan:  You appear to have a sinus infection with some concern for possible strep pharyngitis. I am prescribing azithromycin  antibiotic for the infection. To help with the nasal congestion I prescribed  flonase nasal steroid. For your associated cough, I prescribed cough medicine hycodan  Rest, hydrate, tylenol for fever.  Follow up in 7 days or as needed.  Mackie Pai, PA-C

## 2017-10-25 ENCOUNTER — Encounter: Payer: Self-pay | Admitting: Medical

## 2017-10-25 ENCOUNTER — Telehealth: Payer: Self-pay | Admitting: Family

## 2017-10-25 NOTE — Telephone Encounter (Signed)
Copied from [place] [id number]. Topic: Quick Communication - Rx Refill/Question >> Oct 25, 2017  5:33 PM Oliver Pila B wrote: Medication: HYDROcodone-homatropine (HYCODAN) 5-1.5 MG/5ML syrup [[id number]]  PT STATES SHE IS ALLERGIC TO CODEINE AND WAS HAVING REACTIONS TO THE MEDICATION LAST NIGHT AND IS WONDERING IF ANOTHER MEDICATION COULD BE CALLED IN FOR HER

## 2017-10-26 MED ORDER — BENZONATATE 100 MG PO CAPS: ORAL_CAPSULE | ORAL | 0 refills | Status: DC

## 2017-10-26 NOTE — Telephone Encounter (Signed)
I sent in rx of benzonatate to her pharmacy.

## 2017-11-06 ENCOUNTER — Telehealth: Payer: Self-pay | Admitting: Family

## 2017-11-06 NOTE — Telephone Encounter (Signed)
Copied from [place]. Topic: Quick Communication - Rx Refill/Question >> Nov 06, 2017 11:15 AM Synthia Innocent wrote: Medication: losartan (COZAAR) 100 MG tablet    Has the patient contacted their pharmacy? Yes.     (Agent: If no, request that the patient contact the pharmacy for the refill.)   Preferred Pharmacy (with phone number or street name): [facility] on [address]   Agent: Please be advised that RX refills may take up to 3 business days. We ask that you follow-up with your pharmacy.

## 2017-11-07 ENCOUNTER — Other Ambulatory Visit: Payer: Self-pay

## 2017-11-07 MED ORDER — LOSARTAN POTASSIUM 100 MG PO TABS: 100.0000 mg | ORAL_TABLET | Freq: Every day | ORAL | 2 refills | Status: DC

## 2017-11-27 DIAGNOSIS — K5904 Chronic idiopathic constipation: Secondary | ICD-10-CM | POA: Diagnosis not present

## 2017-11-27 DIAGNOSIS — Z800 Family history of malignant neoplasm of digestive organs: Secondary | ICD-10-CM | POA: Diagnosis not present

## 2017-11-27 DIAGNOSIS — Z1211 Encounter for screening for malignant neoplasm of colon: Secondary | ICD-10-CM | POA: Diagnosis not present

## 2017-11-27 DIAGNOSIS — K7581 Nonalcoholic steatohepatitis (NASH): Secondary | ICD-10-CM | POA: Diagnosis not present

## 2017-11-29 DIAGNOSIS — K644 Residual hemorrhoidal skin tags: Secondary | ICD-10-CM | POA: Diagnosis not present

## 2017-11-29 DIAGNOSIS — K5900 Constipation, unspecified: Secondary | ICD-10-CM | POA: Diagnosis not present

## 2017-11-29 DIAGNOSIS — K6289 Other specified diseases of anus and rectum: Secondary | ICD-10-CM | POA: Diagnosis not present

## 2017-12-04 ENCOUNTER — Ambulatory Visit: Payer: Self-pay | Admitting: Registered"

## 2017-12-18 DIAGNOSIS — Z800 Family history of malignant neoplasm of digestive organs: Secondary | ICD-10-CM | POA: Diagnosis not present

## 2017-12-18 DIAGNOSIS — Z1211 Encounter for screening for malignant neoplasm of colon: Secondary | ICD-10-CM | POA: Diagnosis not present

## 2018-01-09 ENCOUNTER — Encounter: Payer: BLUE CROSS/BLUE SHIELD | Admitting: Family

## 2018-04-05 DIAGNOSIS — Z803 Family history of malignant neoplasm of breast: Secondary | ICD-10-CM | POA: Diagnosis not present

## 2018-04-05 DIAGNOSIS — Z1231 Encounter for screening mammogram for malignant neoplasm of breast: Secondary | ICD-10-CM | POA: Diagnosis not present

## 2018-04-05 DIAGNOSIS — Z6833 Body mass index (BMI) 33.0-33.9, adult: Secondary | ICD-10-CM | POA: Diagnosis not present

## 2018-04-05 DIAGNOSIS — Z01419 Encounter for gynecological examination (general) (routine) without abnormal findings: Secondary | ICD-10-CM | POA: Diagnosis not present

## 2018-04-09 LAB — HM PAP SMEAR: HM Pap smear: NORMAL

## 2018-04-15 ENCOUNTER — Other Ambulatory Visit: Payer: Self-pay

## 2018-04-15 ENCOUNTER — Emergency Department (HOSPITAL_BASED_OUTPATIENT_CLINIC_OR_DEPARTMENT_OTHER)
Admission: EM | Admit: 2018-04-15 | Discharge: 2018-04-15 | Disposition: A | Discharge status: Home / Self Care | Payer: BLUE CROSS/BLUE SHIELD | Attending: Emergency Medicine | Admitting: Emergency Medicine

## 2018-04-15 ENCOUNTER — Encounter (HOSPITAL_BASED_OUTPATIENT_CLINIC_OR_DEPARTMENT_OTHER): Payer: Self-pay

## 2018-04-15 DIAGNOSIS — Z79899 Other long term (current) drug therapy: Secondary | ICD-10-CM | POA: Diagnosis not present

## 2018-04-15 DIAGNOSIS — E119 Type 2 diabetes mellitus without complications: Secondary | ICD-10-CM | POA: Insufficient documentation

## 2018-04-15 DIAGNOSIS — M545 Low back pain: Secondary | ICD-10-CM | POA: Diagnosis not present

## 2018-04-15 DIAGNOSIS — M5416 Radiculopathy, lumbar region: Secondary | ICD-10-CM | POA: Diagnosis not present

## 2018-04-15 DIAGNOSIS — I10 Essential (primary) hypertension: Secondary | ICD-10-CM | POA: Insufficient documentation

## 2018-04-15 MED ORDER — METHOCARBAMOL 500 MG PO TABS: 1000.0000 mg | ORAL_TABLET | Freq: Four times a day (QID) | ORAL | 0 refills | Status: DC

## 2018-04-15 MED ORDER — KETOROLAC TROMETHAMINE 60 MG/2ML IM SOLN
30.0000 mg | Freq: Once | INTRAMUSCULAR | Status: AC
  Administered 2018-04-15: 30 mg via INTRAMUSCULAR
  Filled 2018-04-15: qty 2

## 2018-04-15 MED ORDER — METHOCARBAMOL 500 MG PO TABS
1000.0000 mg | ORAL_TABLET | Freq: Once | ORAL | Status: AC
  Administered 2018-04-15: 1000 mg via ORAL
  Filled 2018-04-15: qty 2

## 2018-04-15 MED ORDER — NAPROXEN 500 MG PO TABS: 500.0000 mg | ORAL_TABLET | Freq: Two times a day (BID) | ORAL | 0 refills | Status: DC

## 2018-04-15 MED FILL — METHOCARBAMOL 500 MG TABLET: 500 | 4 days supply | Qty: 30 | Fill #0

## 2018-04-15 NOTE — ED Triage Notes (Signed)
Pt reports she was walking up her stairs at home when her right leg went numb and she tripped up the stairs. Pt reports shortly after she started having severe lower back pain with radiation to her right leg.

## 2018-04-15 NOTE — ED Provider Notes (Signed)
[facility] EMERGENCY DEPARTMENT Provider Note   CSN: [id number] Arrival date & time: 04/15/18  1121     History   Chief Complaint Chief Complaint  Patient presents with  . Back Pain    HPI Michele [name] is a 47 y.o. female.  Patient presents with complaint of lower back pain starting 2 days ago.  No injury at onset.  Patient began having some pain that was mild in her right lower back with radiation into her right thigh.  This became much worse yesterday.  Pain is a shooting pain to the back of her leg.  She denies any numbness or tingling.  She has been able to ambulate but slowly due to the pain.  Pain is worse with certain positions and with palpation.  She took ibuprofen without relief.  No urinary symptoms. Patient denies warning symptoms of back pain including: fecal incontinence, urinary retention or overflow incontinence, night sweats, waking from sleep with back pain, unexplained fevers or weight loss, h/o cancer, IVDU, recent trauma.  She has not had pain like this in the past.  No abdominal pain, constipation or diarrhea.      Past Medical History:  Diagnosis Date  . Anemia    iron deficient, microcytic, hypochromic  . Anxiety   . Chest pain    atypical  . Depression   . Diabetes mellitus without complication ([place])   . Fatigue   . Fatty liver 08/15/2013  . Fibroids    uterine  . Hypertension   . Migraines   . 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 Posey Pronto at [facility]- S/p FNA March/april of two dominant nodules- benign.  Following for annual thyroid US with Dr. Posey Pronto.    . Palpitations    recurrent  . 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 Tachycardia    unspecified    Patient Active Problem List   Diagnosis Date Noted  . Vitamin D deficiency 01/04/2015  . Fibromyalgia 11/04/2014  . Depression 11/04/2014  . Migraine 10/01/2014  . Fatigue 03/02/2014  . Preventative health care 10/09/2013  . Fatty liver  08/15/2013  . Diabetes type 2, controlled ([place]) 07/11/2012  . Anxiety and depression 11/13/2011  . OSA (obstructive sleep apnea) 10/31/2011  . 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 IRRITABLE BOWEL SYNDROME 08/01/2010  . FIBROIDS, UTERUS 05/24/2010  . Other iron deficiency anemia 05/24/2010  . Essential hypertension 12/22/2009    Past Surgical History:  Procedure Laterality Date  . APPENDECTOMY  03/2009  . BIOPSY THYROID  November 28, 2009   Dr. Posey Pronto- endo  . DILATION AND CURETTAGE OF UTERUS  05/21/2011   Procedure: DILATATION AND CURETTAGE (D&C);  Surgeon: Elveria Royals;  Location: [facility];  Service: Gynecology;  Laterality: N/A;  dilitation and currettage/endometrial currettings  . TUBAL LIGATION  1997  . TUBOPLASTY / TUBOTUBAL ANASTOMOSIS  2005     OB History    Gravida  4   Para  2   Term  1   Preterm  1   AB  2   Living  2     SAB      TAB  1   Ectopic  1   Multiple      Live Births               Home Medications    Prior to Admission medications   Medication Sig Start Date End Date Taking? Authorizing Provider  ALPRAZolam Duanne Moron) 1 MG tablet  Take 1 mg by mouth as needed. For anxiety   Yes [provider]  losartan (COZAAR) 100 MG tablet Take 1 tablet (100 mg total) by mouth daily. 11/07/17  Yes Debbrah Alar, NP  spironolactone (ALDACTONE) 25 MG tablet Take 1 tablet (25 mg total) by mouth daily. 10/04/17  Yes Debbrah Alar, NP  atorvastatin (LIPITOR) 10 MG tablet Take 1 tablet (10 mg total) by mouth daily. 10/04/17   Debbrah Alar, NP  azithromycin (ZITHROMAX) 250 MG tablet Take 2 tablets by mouth on day 1, followed by 1 tablet by mouth daily for 4 days. 10/24/17   Saguier, Percell Miller, PA-C  benzonatate (TESSALON) 100 MG capsule 1-2 tab po q 8 hours as needed for cough 10/26/17   Saguier, Percell Miller, PA-C  Cholecalciferol (VITAMIN D3) 3000 units TABS Take 1 tablet by mouth daily. 10/04/17   Debbrah Alar, NP  cyanocobalamin  (,VITAMIN B-12,) 1000 MCG/ML injection Inject 1 mL into the muscle every 30 (thirty) days. 04/15/14   [provider]  DULoxetine (CYMBALTA) 30 MG capsule Take 1 capsule by mouth daily. 10/02/16   [provider]  EPIPEN 2-PAK 0.3 MG/0.3ML SOAJ injection daily as needed (FOR ALLERGY).  05/14/13   [provider]  FLUoxetine (PROZAC) 40 MG capsule Take 1 capsule by mouth daily. 10/02/16   [provider]  fluticasone (FLONASE) 50 MCG/ACT nasal spray Place 2 sprays into both nostrils daily. 10/24/17   Saguier, Percell Miller, PA-C  HYDROcodone-homatropine (HYCODAN) 5-1.5 MG/5ML syrup Take 5 mLs by mouth every 8 (eight) hours as needed. 10/24/17   Saguier, Percell Miller, PA-C  ibuprofen (ADVIL,MOTRIN) 800 MG tablet Take 800 mg by mouth 3 (three) times daily. 05/20/15   [provider]  loratadine (CLARITIN) 10 MG tablet Take 1 tablet (10 mg total) by mouth daily. 04/04/17   Debbrah Alar, NP  methocarbamol (ROBAXIN) 500 MG tablet Take 2 tablets (1,000 mg total) by mouth 4 (four) times daily. 04/15/18   Carlisle Cater, PA-C  naproxen (NAPROSYN) 500 MG tablet Take 1 tablet (500 mg total) by mouth 2 (two) times daily. 04/15/18   Carlisle Cater, PA-C  Plecanatide (TRULANCE) 3 MG TABS Take 1 tablet by mouth daily.    [provider]  spironolactone (ALDACTONE) 25 MG tablet TAKE 1 TABLET BY MOUTH ONCE DAILY 10/17/17   Debbrah Alar, NP  buPROPion (WELLBUTRIN XL) 150 MG 24 hr tablet Take 2 tablets (300 mg total) by mouth daily. 10/13/11 11/28/11  Debbrah Alar, NP    Family History Family History  Problem Relation Age of Onset  . Hypertension Mother   . Cancer Mother 36       breast  . Cancer Father        colon  . Stroke Brother        handicapped due to complications from spinal meningitis  . Seizures Brother   . Asthma Son   . Arthritis Maternal Grandmother   . Hypertension Maternal Grandmother   . Stroke Maternal Grandfather     Social History Social  History   Tobacco Use  . Smoking status: Never Smoker  . Smokeless tobacco: Never Used  . Tobacco comment: never used tobacco  Substance Use Topics  . Alcohol use: Yes    Comment: Once a month  . Drug use: No     Allergies   Amlodipine; Bee venom; Bupropion; Clonidine derivatives; Hydralazine; Imitrex [sumatriptan]; and Oxycodone   Review of Systems Review of Systems  Constitutional: Negative for fever and unexpected weight change.  Gastrointestinal: Negative  for constipation.       Negative for fecal incontinence.   Genitourinary: Negative for dysuria, flank pain, hematuria, pelvic pain, vaginal bleeding and vaginal discharge.       Negative for urinary incontinence or retention.  Musculoskeletal: Positive for back pain.  Neurological: Negative for weakness and numbness.       Denies saddle paresthesias.     Physical Exam Updated Vital Signs BP 117/83 (BP Location: Left Arm)   Pulse 76   Temp 98.4 F (36.9 C) (Oral)   Resp 18   Ht 5\' 4"  (1.626 m)   Wt 91.4 kg (201 lb 8 oz)   LMP 06/11/2013   SpO2 98%   BMI 34.59 kg/m   Physical Exam  Constitutional: She appears well-developed and well-nourished.  HENT:  Head: Normocephalic and atraumatic.  Eyes: Conjunctivae are normal.  Neck: Normal range of motion. Neck supple.  Pulmonary/Chest: Effort normal.  Abdominal: Soft. There is no tenderness. There is no CVA tenderness.  Musculoskeletal: Normal range of motion.       Cervical back: She exhibits normal range of motion, no tenderness and no bony tenderness.       Thoracic back: She exhibits normal range of motion, no tenderness and no bony tenderness.       Lumbar back: She exhibits tenderness. She exhibits normal range of motion and no bony tenderness.       Back:  No step-off noted with palpation of spine.   Neurological: She is alert. She has normal strength and normal reflexes. No sensory deficit.  5/5 strength in entire lower extremities bilaterally. No  sensation deficit.   Skin: Skin is warm and dry. No rash noted.  Psychiatric: She has a normal mood and affect.  Nursing note and vitals reviewed.    ED Treatments / Results  Labs (all labs ordered are listed, but only abnormal results are displayed) Labs Reviewed - No data to display  EKG None  Radiology No results found.  Procedures Procedures (including critical care time)  Medications Ordered in ED Medications  ketorolac (TORADOL) injection 30 mg (has no administration in time range)  methocarbamol (ROBAXIN) tablet 1,000 mg (has no administration in time range)     Initial Impression / Assessment and Plan / ED Course  I have reviewed the triage vital signs and the nursing notes.  Pertinent labs & imaging results that were available during my care of the patient were reviewed by me and considered in my medical decision making (see chart for details).     12:44 PM Patient seen and examined.  Medications ordered.   Vital signs reviewed and are as follows: Vitals:   04/15/18 1126  BP: 117/83  Pulse: 76  Resp: 18  Temp: 98.4 F (36.9 C)  SpO2: 98%    No red flag s/s of low back pain. Patient was counseled on back pain precautions and told to do activity as tolerated but do not lift, push, or pull heavy objects more than 10 pounds for the next week.  Patient counseled to use ice or heat on back for no longer than 15 minutes every hour.   Patient counseled on proper use of muscle relaxant medication.  They were told not to drink alcohol, drive any vehicle, or do any dangerous activities while taking this medication.  Patient verbalized understanding.  Patient urged to follow-up with PCP if pain does not improve with treatment and rest or if pain becomes recurrent. Urged to return with worsening severe pain,  loss of bowel or bladder control, trouble walking.   The patient verbalizes understanding and agrees with the plan.  Final Clinical Impressions(s) / ED  Diagnoses   Final diagnoses:  Lumbar radiculopathy   Patient with back pain with radicular freatures. No neurological deficits. Patient is ambulatory. No warning symptoms of back pain including: fecal incontinence, urinary retention or overflow incontinence, night sweats, waking from sleep with back pain, unexplained fevers or weight loss, h/o cancer, IVDU, recent trauma. No concern for cauda equina, epidural abscess, or other serious cause of back pain. Conservative measures such as rest, ice/heat and pain medicine indicated with PCP follow-up if no improvement with conservative management.    ED Discharge Orders        Ordered    naproxen (NAPROSYN) 500 MG tablet  2 times daily     04/15/18 1238    methocarbamol (ROBAXIN) 500 MG tablet  4 times daily     04/15/18 1238       Carlisle Cater, PA-C 04/15/18 1244    Blanchie Dessert, MD 04/19/18 1440

## 2018-04-15 NOTE — Discharge Instructions (Signed)
Please read and follow all provided instructions.  Your diagnoses today include:  1. Lumbar radiculopathy     Tests performed today include:  Vital signs - see below for your results today  Medications prescribed:   Robaxin (methocarbamol) - muscle relaxer medication  DO NOT drive or perform any activities that require you to be awake and alert because this medicine can make you drowsy.    Naproxen - anti-inflammatory pain medication  Do not exceed 500mg  naproxen every 12 hours, take with food  You have been prescribed an anti-inflammatory medication or NSAID. Take with food. Take smallest effective dose for the shortest duration needed for your pain. Stop taking if you experience stomach pain or vomiting.   Take any prescribed medications only as directed.  Home care instructions:   Follow any educational materials contained in this packet  Please rest, use ice or heat on your back for the next several days  Do not lift, push, pull anything more than 10 pounds for the next week  Follow-up instructions: Please follow-up with your primary care provider in the next 1 week for further evaluation of your symptoms.   Return instructions:  SEEK IMMEDIATE MEDICAL ATTENTION IF YOU HAVE:  New numbness, tingling, weakness, or problem with the use of your arms or legs  Severe back pain not relieved with medications  Loss control of your bowels or bladder  Increasing pain in any areas of the body (such as chest or abdominal pain)  Shortness of breath, dizziness, or fainting.   Worsening nausea (feeling sick to your stomach), vomiting, fever, or sweats  Any other emergent concerns regarding your health   Additional Information:  Your vital signs today were: BP 117/83 (BP Location: Left Arm)    Pulse 76    Temp 98.4 F (36.9 C) (Oral)    Resp 18    Ht 5\' 4"  (1.626 m)    Wt 91.4 kg (201 lb 8 oz)    LMP 06/11/2013    SpO2 98%    BMI 34.59 kg/m  If your blood pressure (BP)  was elevated above 135/85 this visit, please have this repeated by your doctor within one month. --------------

## 2018-05-18 ENCOUNTER — Other Ambulatory Visit: Payer: Self-pay | Admitting: Family

## 2018-05-20 NOTE — Telephone Encounter (Signed)
rx sent for 30 day supply, due for OV please.

## 2018-05-20 NOTE — Telephone Encounter (Signed)
Michele [name] -- pt last seen in February for acute visit and has no future appts scheduled. Please advise spironolactone refill and next follow up?

## 2018-05-27 NOTE — Telephone Encounter (Signed)
appt scheduled for 06/10/18 at 4:00

## 2018-06-05 LAB — HM DIABETES EYE EXAM

## 2018-06-10 ENCOUNTER — Ambulatory Visit: Payer: BLUE CROSS/BLUE SHIELD | Admitting: Family

## 2018-06-17 ENCOUNTER — Ambulatory Visit: Payer: BLUE CROSS/BLUE SHIELD | Admitting: Family

## 2018-06-17 ENCOUNTER — Encounter: Payer: Self-pay | Admitting: Family

## 2018-06-17 VITALS — BP 138/84 | HR 85 | Temp 98.7°F | Resp 16 | Ht 65.0 in | Wt 198.6 lb

## 2018-06-17 DIAGNOSIS — E119 Type 2 diabetes mellitus without complications: Secondary | ICD-10-CM | POA: Diagnosis not present

## 2018-06-17 DIAGNOSIS — E785 Hyperlipidemia, unspecified: Secondary | ICD-10-CM

## 2018-06-17 DIAGNOSIS — F329 Major depressive disorder, single episode, unspecified: Secondary | ICD-10-CM

## 2018-06-17 DIAGNOSIS — F419 Anxiety disorder, unspecified: Secondary | ICD-10-CM | POA: Diagnosis not present

## 2018-06-17 DIAGNOSIS — F32A Depression, unspecified: Secondary | ICD-10-CM

## 2018-06-17 DIAGNOSIS — R252 Cramp and spasm: Secondary | ICD-10-CM

## 2018-06-17 MED ORDER — SPIRONOLACTONE 25 MG PO TABS: 25.0000 mg | ORAL_TABLET | Freq: Every day | ORAL | 5 refills | Status: DC

## 2018-06-17 MED ORDER — LOSARTAN POTASSIUM 100 MG PO TABS: 100.0000 mg | ORAL_TABLET | Freq: Every day | ORAL | 5 refills | Status: DC

## 2018-06-17 MED ORDER — ATORVASTATIN CALCIUM 10 MG PO TABS: 10.0000 mg | ORAL_TABLET | Freq: Every day | ORAL | 5 refills | Status: DC

## 2018-06-17 NOTE — Patient Instructions (Signed)
Please complete lab work prior to leaving.   

## 2018-06-17 NOTE — Progress Notes (Signed)
Subjective:    Patient ID: Michele [name], female    DOB: 09-29-1970, 47 y.o.   MRN: [id number]  HPI  Patient is a 47 yr old female who presents today for follow up.  DM2- on aldactone and losartan.  Reports muscle spasms/twitching in calfs, having some bilateral elbow pain.  Denies swelling Lab Results  Component Value Date   HGBA1C 5.9 10/04/2017   HGBA1C 6.2 07/04/2017   HGBA1C 5.6 05/26/2016   Lab Results  Component Value Date   MICROALBUR 0.9 07/04/2017   LDLCALC 120 (H) 10/04/2017   CREATININE 0.82 10/04/2017   Wt Readings from Last 3 Encounters:  06/17/18 198 lb 9.6 oz (90.1 kg)  04/15/18 201 lb 8 oz (91.4 kg)  10/24/17 205 lb 6.4 oz (93.2 kg)   Hyperlipidemia- never started the lipitor.  Lab Results  Component Value Date   CHOL 175 10/04/2017   HDL 36.60 (L) 10/04/2017   LDLCALC 120 (H) 10/04/2017   TRIG 92.0 10/04/2017   CHOLHDL 5 10/04/2017   Anxiety/depression-  Stopped prozac and cymbalta.  Reports that she still has anxiety.  Has a follow up appointment scheduled with psychiatry.    Review of Systems See  HPI  Past Medical History:  Diagnosis Date  . Anemia    iron deficient, microcytic, hypochromic  . Anxiety   . Chest pain    atypical  . Depression   . Diabetes mellitus without complication ([place])   . Fatigue   . Fatty liver 08/15/2013  . Fibroids    uterine  . Hypertension   . Migraines   . 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 Posey Pronto at [facility]- S/p FNA March/april of two dominant nodules- benign.  Following for annual thyroid US with Dr. Posey Pronto.    . Palpitations    recurrent  . 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 Tachycardia    unspecified     Social History   Socioeconomic History  . Marital status: Married    Spouse name: [name]  . Number of children: 2  . Years of education: Not on file  . Highest education level: Not on file  Occupational History  . Occupation: CLAIMS AGENT   Employer: [employer]  Social Needs  . Financial resource strain: Not on file  . Food insecurity:    Worry: Not on file    Inability: Not on file  . Transportation needs:    Medical: Not on file    Non-medical: Not on file  Tobacco Use  . Smoking status: Never Smoker  . Smokeless tobacco: Never Used  . Tobacco comment: never used tobacco  Substance and Sexual Activity  . Alcohol use: Yes    Comment: Once a month  . Drug use: No  . Sexual activity: Yes    Birth control/protection: None, Surgical  Lifestyle  . Physical activity:    Days per week: Not on file    Minutes per session: Not on file  . Stress: Not on file  Relationships  . Social connections:    Talks on phone: Not on file    Gets together: Not on file    Attends religious service: Not on file    Active member of club or organization: Not on file    Attends meetings of clubs or organizations: Not on file    Relationship status: Not on file  . Intimate partner violence:    Fear of current or ex partner: Not on file  Emotionally abused: Not on file    Physically abused: Not on file    Forced sexual activity: Not on file  Other Topics Concern  . 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 APPENDECTOMY  03/2009  . BIOPSY THYROID  November 28, 2009   Dr. Posey Pronto- endo  . DILATION AND CURETTAGE OF UTERUS  05/21/2011   Procedure: DILATATION AND CURETTAGE (D&C);  Surgeon: Elveria Royals;  Location: [facility];  Service: Gynecology;  Laterality: N/A;  dilitation and currettage/endometrial currettings  . TUBAL LIGATION  1997  . TUBOPLASTY / TUBOTUBAL ANASTOMOSIS  2005    Family History  Problem Relation Age of Onset  . Hypertension Mother   . Cancer Mother 11       breast  . Cancer Father        colon  . Stroke  Brother        handicapped due to complications from spinal meningitis  . Seizures Brother   . Asthma Son   . Arthritis Maternal Grandmother   . Hypertension Maternal Grandmother   . Stroke Maternal Grandfather     Allergies  Allergen Reactions  . Amlodipine Shortness Of Breath, Anxiety, Palpitations and Other (See Comments)  . Bee Venom Anaphylaxis  . Bupropion Shortness Of Breath, Anxiety, Palpitations and Other (See Comments)  . Clonidine Derivatives     dizziness  . Hydralazine     dizziness  . Imitrex [Sumatriptan] Nausea Only  . Oxycodone Nausea Only    Current Outpatient Medications on File Prior to Visit  Medication Sig Dispense Refill  . ALPRAZolam (XANAX) 1 MG tablet Take 1 mg by mouth as needed. For anxiety    . atorvastatin (LIPITOR) 10 MG tablet Take 1 tablet (10 mg total) by mouth daily. 30 tablet 3  . EPIPEN 2-PAK 0.3 MG/0.3ML SOAJ injection daily as needed (FOR ALLERGY).     . fluticasone (FLONASE) 50 MCG/ACT nasal spray Place 2 sprays into both nostrils daily. 16 g 1  . ibuprofen (ADVIL,MOTRIN) 800 MG tablet Take 800 mg by mouth 3 (three) times daily.  0  . losartan (COZAAR) 100 MG tablet Take 1 tablet (100 mg total) by mouth daily. 30 tablet 2  . spironolactone (ALDACTONE) 25 MG tablet Take 1 tablet (25 mg total) by mouth daily.    Marland Kitchen spironolactone (ALDACTONE) 25 MG tablet TAKE 1 TABLET BY MOUTH ONCE DAILY 30 tablet 0  . [DISCONTINUED] buPROPion (WELLBUTRIN XL) 150 MG 24 hr tablet Take 2 tablets (300 mg total) by mouth daily. 60 tablet 1   No current facility-administered medications on file prior to visit.     BP 138/84 (BP Location: Right Arm, Patient Position: Sitting, Cuff Size: Small)   Pulse 85   Temp 98.7 F (37.1 C) (Oral)   Resp 16   Ht 5\' 5"  (1.651 m)   Wt 198 lb 9.6 oz (90.1 kg)   LMP 06/11/2013   SpO2 99%   BMI 33.05 kg/m       Objective:   Physical Exam  Constitutional: She is oriented to person, place, and time. She appears  well-developed and well-nourished.  Cardiovascular: Normal rate, regular rhythm and normal heart sounds.  No murmur heard. Pulmonary/Chest: Effort normal  and breath sounds normal. No respiratory distress. She has no wheezes.  Musculoskeletal: She exhibits no edema.  Neurological: She is alert and oriented to person, place, and time.  Skin: Skin is warm and dry.  Psychiatric: She has a normal mood and affect. Her behavior is normal. Judgment and thought content normal.          Assessment & Plan:  DM2- clinically stable.  Obtain follow up A1C, bmet  Leg spasms/cramps- obtain electrolytes/magnesium.  Hyperlipidemia- she is agreeable to start statin.  Will begin. Obtain follow up lipid panel.  Anxiety/depresssion- fair control- management per psychiatry.   Declines flu shot today. Will get from employer.

## 2018-06-18 LAB — MAGNESIUM: Magnesium: 2.2 mg/dL (ref 1.5–2.5)

## 2018-06-18 LAB — LIPID PANEL
CHOL/HDL RATIO: 5
CHOLESTEROL: 165 mg/dL (ref 0–200)
HDL: 33.3 mg/dL — ABNORMAL LOW (ref >39.00)
LDL CALC: 104 mg/dL — AB (ref 0–99)
NonHDL: 131.61
TRIGLYCERIDES: 138 mg/dL (ref 0.0–149.0)
VLDL: 27.6 mg/dL (ref 0.0–40.0)

## 2018-06-18 LAB — BASIC METABOLIC PANEL
BUN: 11 mg/dL (ref 6–23)
CALCIUM: 9.5 mg/dL (ref 8.4–10.5)
CHLORIDE: 104 meq/L (ref 96–112)
CO2: 28 mEq/L (ref 19–32)
CREATININE: 0.91 mg/dL (ref 0.40–1.20)
GFR: 84.59 mL/min (ref >60.00)
Glucose, Bld: 63 mg/dL — ABNORMAL LOW (ref 70–99)
Potassium: 4.1 mEq/L (ref 3.5–5.1)
Sodium: 141 mEq/L (ref 135–145)

## 2018-06-18 LAB — HEMOGLOBIN A1C: HEMOGLOBIN A1C: 6 % (ref 4.6–6.5)

## 2018-07-31 ENCOUNTER — Emergency Department (HOSPITAL_BASED_OUTPATIENT_CLINIC_OR_DEPARTMENT_OTHER): Payer: BLUE CROSS/BLUE SHIELD

## 2018-07-31 ENCOUNTER — Encounter (HOSPITAL_BASED_OUTPATIENT_CLINIC_OR_DEPARTMENT_OTHER): Payer: Self-pay | Admitting: *Deleted

## 2018-07-31 ENCOUNTER — Emergency Department (HOSPITAL_BASED_OUTPATIENT_CLINIC_OR_DEPARTMENT_OTHER)
Admission: EM | Admit: 2018-07-31 | Discharge: 2018-07-31 | Disposition: A | Discharge status: Home / Self Care | Payer: BLUE CROSS/BLUE SHIELD | Attending: Emergency Medicine | Admitting: Emergency Medicine

## 2018-07-31 ENCOUNTER — Other Ambulatory Visit: Payer: Self-pay

## 2018-07-31 ENCOUNTER — Emergency Department (HOSPITAL_COMMUNITY): Payer: BLUE CROSS/BLUE SHIELD

## 2018-07-31 DIAGNOSIS — E119 Type 2 diabetes mellitus without complications: Secondary | ICD-10-CM | POA: Diagnosis not present

## 2018-07-31 DIAGNOSIS — G43109 Migraine with aura, not intractable, without status migrainosus: Secondary | ICD-10-CM | POA: Diagnosis not present

## 2018-07-31 DIAGNOSIS — R200 Anesthesia of skin: Secondary | ICD-10-CM

## 2018-07-31 DIAGNOSIS — R29810 Facial weakness: Secondary | ICD-10-CM | POA: Diagnosis not present

## 2018-07-31 DIAGNOSIS — I10 Essential (primary) hypertension: Secondary | ICD-10-CM | POA: Diagnosis not present

## 2018-07-31 DIAGNOSIS — Z79899 Other long term (current) drug therapy: Secondary | ICD-10-CM | POA: Insufficient documentation

## 2018-07-31 DIAGNOSIS — R531 Weakness: Secondary | ICD-10-CM | POA: Diagnosis not present

## 2018-07-31 DIAGNOSIS — Z862 Personal history of diseases of the blood and blood-forming organs and certain disorders involving the immune mechanism: Secondary | ICD-10-CM | POA: Diagnosis not present

## 2018-07-31 DIAGNOSIS — G43809 Other migraine, not intractable, without status migrainosus: Secondary | ICD-10-CM | POA: Diagnosis not present

## 2018-07-31 DIAGNOSIS — R450 Nervousness: Secondary | ICD-10-CM | POA: Insufficient documentation

## 2018-07-31 LAB — TROPONIN I: Troponin I: <0.03 ng/mL (ref <0.03)

## 2018-07-31 LAB — RAPID URINE DRUG SCREEN, HOSP PERFORMED
Amphetamines: NOT DETECTED
BARBITURATES: NOT DETECTED
Benzodiazepines: NOT DETECTED
COCAINE: NOT DETECTED
Opiates: NOT DETECTED
Tetrahydrocannabinol: NOT DETECTED

## 2018-07-31 LAB — BASIC METABOLIC PANEL
ANION GAP: 13 (ref 5–15)
BUN: 10 mg/dL (ref 6–20)
CO2: 22 mmol/L (ref 22–32)
Calcium: 9.1 mg/dL (ref 8.9–10.3)
Chloride: 104 mmol/L (ref 98–111)
Creatinine, Ser: 0.83 mg/dL (ref 0.44–1.00)
GFR calc Af Amer: >60 mL/min (ref >60)
GLUCOSE: 125 mg/dL — AB (ref 70–99)
Potassium: 3.2 mmol/L — ABNORMAL LOW (ref 3.5–5.1)
SODIUM: 139 mmol/L (ref 135–145)

## 2018-07-31 LAB — URINALYSIS, ROUTINE W REFLEX MICROSCOPIC
BILIRUBIN URINE: NEGATIVE
GLUCOSE, UA: NEGATIVE mg/dL
HGB URINE DIPSTICK: NEGATIVE
KETONES UR: NEGATIVE mg/dL
LEUKOCYTES UA: NEGATIVE
Nitrite: NEGATIVE
PH: 6 (ref 5.0–8.0)
Protein, ur: NEGATIVE mg/dL
SPECIFIC GRAVITY, URINE: 1.02 (ref 1.005–1.030)

## 2018-07-31 LAB — CBC WITH DIFFERENTIAL/PLATELET
Abs Immature Granulocytes: 0.04 10*3/uL (ref 0.00–0.07)
BASOS ABS: 0 10*3/uL (ref 0.0–0.1)
Basophils Relative: 0 %
Eosinophils Absolute: 0.1 10*3/uL (ref 0.0–0.5)
Eosinophils Relative: 1 %
HEMATOCRIT: 44 % (ref 36.0–46.0)
HEMOGLOBIN: 14.1 g/dL (ref 12.0–15.0)
IMMATURE GRANULOCYTES: 0 %
LYMPHS ABS: 4.4 10*3/uL — AB (ref 0.7–4.0)
LYMPHS PCT: 42 %
MCH: 28.1 pg (ref 26.0–34.0)
MCHC: 32 g/dL (ref 30.0–36.0)
MCV: 87.6 fL (ref 80.0–100.0)
Monocytes Absolute: 0.6 10*3/uL (ref 0.1–1.0)
Monocytes Relative: 6 %
NEUTROS ABS: 5.3 10*3/uL (ref 1.7–7.7)
NEUTROS PCT: 51 %
NRBC: 0 % (ref 0.0–0.2)
Platelets: 265 10*3/uL (ref 150–400)
RBC: 5.02 MIL/uL (ref 3.87–5.11)
RDW: 13.2 % (ref 11.5–15.5)
WBC: 10.4 10*3/uL (ref 4.0–10.5)

## 2018-07-31 LAB — ETHANOL

## 2018-07-31 LAB — PROTIME-INR
INR: 0.97
Prothrombin Time: 12.8 seconds (ref 11.4–15.2)

## 2018-07-31 LAB — CBG MONITORING, ED: Glucose-Capillary: 115 mg/dL — ABNORMAL HIGH (ref 70–99)

## 2018-07-31 LAB — PREGNANCY, URINE: Preg Test, Ur: NEGATIVE

## 2018-07-31 MED ORDER — KETOROLAC TROMETHAMINE 30 MG/ML IJ SOLN
30.0000 mg | Freq: Once | INTRAMUSCULAR | Status: AC
  Administered 2018-07-31: 30 mg via INTRAVENOUS
  Filled 2018-07-31: qty 1

## 2018-07-31 MED ORDER — PROCHLORPERAZINE EDISYLATE 10 MG/2ML IJ SOLN
10.0000 mg | Freq: Once | INTRAMUSCULAR | Status: AC
  Administered 2018-07-31: 10 mg via INTRAVENOUS
  Filled 2018-07-31: qty 2

## 2018-07-31 MED ORDER — LORAZEPAM 2 MG/ML IJ SOLN
1.0000 mg | Freq: Once | INTRAMUSCULAR | Status: AC
  Administered 2018-07-31: 1 mg via INTRAVENOUS
  Filled 2018-07-31: qty 1

## 2018-07-31 NOTE — ED Provider Notes (Signed)
` [facility] EMERGENCY DEPARTMENT Provider Note   CSN: [id number] Arrival date & time: 07/31/18  1629     History   Chief Complaint Chief Complaint  Patient presents with  . Numbness    HPI Michele [name] is a 47 y.o. female with PMH/o Anemia, Anxiety, CP, DM, HTN, who presents for evaluation of left facial and left upper extremity numbness that began about 45 minutes prior to ED arrival.  Patient reports that she just got home from getting food and states that she felt like her left side of her face was tingling.  He states that shortly after, she started noticing this the same symptoms in her left upper extremity and she felt like her fingertips or tingling.  She states that she feels like "my arm is not my arm" and states that it just feels " kind of different."  She states that she took her blood pressure at home and noted to be elevated with systolic in the 342A and became concerned, prompting ED visit.  Patient reports that during the episode, she had one initial episode of chest pain but states that this was when she was becoming very anxious and she attributed to that.  No chest pain currently.  She denies any speech difficulty, vision changes, numbness/weakness of her arms or legs.  She does have a history of migraines and states for the last several days, she has had some intermittent headaches but she states they were not as bad as her normal migraines.  Additionally, patient noted a cold sore to her left lip this morning.  She denies any fevers, difficulty breathing, abdominal pain, nausea/vomiting.  The history is provided by the patient.    Past Medical History:  Diagnosis Date  . Anemia    iron deficient, microcytic, hypochromic  . Anxiety   . Chest pain    atypical  . Depression   . Diabetes mellitus without complication ([place])   . Fatigue   . Fatty liver 08/15/2013  . Fibroids    uterine  . Hypertension   . Migraines   . 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 Posey Pronto at [facility]- S/p FNA March/april of two dominant nodules- benign.  Following for annual thyroid US with Dr. Posey Pronto.    . Palpitations    recurrent  . 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 Tachycardia    unspecified    Patient Active Problem List   Diagnosis Date Noted  . Vitamin D deficiency 01/04/2015  . Fibromyalgia 11/04/2014  . Depression 11/04/2014  . Migraine 10/01/2014  . Fatigue 03/02/2014  . Preventative health care 10/09/2013  . Fatty liver 08/15/2013  . Diabetes type 2, controlled ([place]) 07/11/2012  . Anxiety and depression 11/13/2011  . OSA (obstructive sleep apnea) 10/31/2011  . 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 IRRITABLE BOWEL SYNDROME 08/01/2010  . FIBROIDS, UTERUS 05/24/2010  . Other iron deficiency anemia 05/24/2010  . Essential hypertension 12/22/2009    Past Surgical History:  Procedure Laterality Date  . APPENDECTOMY  03/2009  . BIOPSY THYROID  November 28, 2009   Dr. Posey Pronto- endo  . DILATION AND CURETTAGE OF UTERUS  05/21/2011   Procedure: DILATATION AND CURETTAGE (D&C);  Surgeon: Elveria Royals;  Location: [facility];  Service: Gynecology;  Laterality: N/A;  dilitation and currettage/endometrial currettings  . TUBAL LIGATION  1997  . TUBOPLASTY / TUBOTUBAL ANASTOMOSIS  2005     OB History    Gravida  4   Para  2   Term  1   Preterm  1   AB  2   Living  2     SAB      TAB  1   Ectopic  1   Multiple      Live Births               Home Medications    Prior to Admission medications   Medication Sig Start Date End Date Taking? Authorizing Provider  ALPRAZolam Duanne Moron) 1 MG tablet Take 1 mg by mouth as needed. For anxiety    [provider]  atorvastatin (LIPITOR) 10 MG tablet Take 1 tablet (10 mg total) by mouth daily. 06/17/18   Debbrah Alar, NP  EPIPEN 2-PAK 0.3 MG/0.3ML SOAJ injection daily as needed (FOR ALLERGY).  05/14/13   [provider]  fluticasone  (FLONASE) 50 MCG/ACT nasal spray Place 2 sprays into both nostrils daily. 10/24/17   Saguier, Percell Miller, PA-C  ibuprofen (ADVIL,MOTRIN) 800 MG tablet Take 800 mg by mouth 3 (three) times daily. 05/20/15   [provider]  losartan (COZAAR) 100 MG tablet Take 1 tablet (100 mg total) by mouth daily. 06/17/18   Debbrah Alar, NP  spironolactone (ALDACTONE) 25 MG tablet Take 1 tablet (25 mg total) by mouth daily. 06/17/18   Debbrah Alar, NP  buPROPion (WELLBUTRIN XL) 150 MG 24 hr tablet Take 2 tablets (300 mg total) by mouth daily. 10/13/11 11/28/11  Debbrah Alar, NP    Family History Family History  Problem Relation Age of Onset  . Hypertension Mother   . Cancer Mother 56       breast  . Cancer Father        colon  . Stroke Brother        handicapped due to complications from spinal meningitis  . Seizures Brother   . Asthma Son   . Arthritis Maternal Grandmother   . Hypertension Maternal Grandmother   . Stroke Maternal Grandfather     Social History Social History   Tobacco Use  . Smoking status: Never Smoker  . Smokeless tobacco: Never Used  . Tobacco comment: never used tobacco  Substance Use Topics  . Alcohol use: Yes    Comment: Once a month  . Drug use: No     Allergies   Amlodipine; Bee venom; Bupropion; Clonidine derivatives; Hydralazine; Imitrex [sumatriptan]; and Oxycodone   Review of Systems Review of Systems  Constitutional: Negative for fever.  Respiratory: Negative for cough and shortness of breath.   Cardiovascular: Negative for chest pain.  Gastrointestinal: Negative for abdominal pain, nausea and vomiting.  Genitourinary: Negative for dysuria and hematuria.  Neurological: Positive for weakness and numbness. Negative for headaches.  All other systems reviewed and are negative.    Physical Exam Updated Vital Signs BP 135/90 (BP Location: Right Arm)   Pulse 96   Temp 98 F (36.7 C) (Oral)   Resp 18   Ht 5\' 4"  (1.626 m)   Wt  88.5 kg   LMP 06/11/2013   SpO2 96%   BMI 33.47 kg/m   Physical Exam  Constitutional: She is oriented to person, place, and time. She appears well-developed and well-nourished.  HENT:  Head: Normocephalic and atraumatic.  Mouth/Throat: Oropharynx is clear and moist and mucous membranes are normal.  Eyes: Pupils are equal, round, and reactive to light. Conjunctivae, EOM and lids are normal.  Neck: Full passive range of motion without pain.  Cardiovascular: Normal rate,  regular rhythm, normal heart sounds and normal pulses. Exam reveals no gallop and no friction rub.  No murmur heard. Pulses:      Radial pulses are 2+ on the right side, and 2+ on the left side.       Dorsalis pedis pulses are 2+ on the right side, and 2+ on the left side.  Pulmonary/Chest: Effort normal and breath sounds normal.  Abdominal: Soft. Normal appearance. There is no tenderness. There is no rigidity and no guarding.  Musculoskeletal: Normal range of motion.  Neurological: She is alert and oriented to person, place, and time.  Cranial nerves III-XII intact Follows commands, Moves all extremities  Slightly unequal grip strength noted to the left upper extremity. 5/5 strength to BUE and BLE  Subjective decreased sensation to the left face that begins at the mid cheek and extends distally. Subjective decreased sensation of the left upper extremity that begins just at the shoulder and extends distally.  Sensation at bilateral neck is the same. Normal finger to nose. No dysdiadochokinesia. No pronator drift. No gait abnormalities  No slurred speech. No facial droop.   Skin: Skin is warm and dry. Capillary refill takes less than 2 seconds.  Good distal cap refill. LUE is not dusky in appearance or cool to touch.  Psychiatric: Her speech is normal. Her mood appears anxious.  Very anxious.  Nursing note and vitals reviewed.    ED Treatments / Results  Labs (all labs ordered are listed, but only abnormal  results are displayed) Labs Reviewed  BASIC METABOLIC PANEL - Abnormal; Notable for the following components:      Result Value   Potassium 3.2 (*)    Glucose, Bld 125 (*)    All other components within normal limits  CBC WITH DIFFERENTIAL/PLATELET - Abnormal; Notable for the following components:   Lymphs Abs 4.4 (*)    All other components within normal limits  CBG MONITORING, ED - Abnormal; Notable for the following components:   Glucose-Capillary 115 (*)    All other components within normal limits  ETHANOL  RAPID URINE DRUG SCREEN, HOSP PERFORMED  URINALYSIS, ROUTINE W REFLEX MICROSCOPIC  PREGNANCY, URINE  TROPONIN I  PROTIME-INR    EKG EKG Interpretation  Date/Time:  Wednesday July 31 2018 16:43:40 EST Ventricular Rate:  124 PR Interval:    QRS Duration: 86 QT Interval:  300 QTC Calculation: 431 R Axis:   69 Text Interpretation:  Sinus tachycardia Minimal ST depression, diffuse leads Baseline wander in lead(s) I II III aVR aVF V5 Confirmed by Veryl Speak [phone]) on 07/31/2018 5:25:03 PM   Radiology Ct Head Wo Contrast  Result Date: 07/31/2018 CLINICAL DATA:  Left facial droop and numbness. EXAM: CT HEAD WITHOUT CONTRAST TECHNIQUE: Contiguous axial images were obtained from the base of the skull through the vertex without intravenous contrast. COMPARISON:  09/18/2014 FINDINGS: Brain: The brainstem, cerebellum, cerebral peduncles, thalami, basal ganglia, basilar cisterns, and ventricular system appear within normal limits. No intracranial hemorrhage, mass lesion, or acute CVA. Vascular: Unremarkable Skull: Unremarkable Sinuses/Orbits: Unremarkable Other: No supplemental non-categorized findings. IMPRESSION: 1. No significant intracranial abnormality is observed. Electronically Signed   By: Van Clines M.D.   On: 07/31/2018 17:37    Procedures Procedures (including critical care time)  Medications Ordered in ED Medications  LORazepam (ATIVAN) injection 1  mg (1 mg Intravenous Given 07/31/18 1704)     Initial Impression / Assessment and Plan / ED Course  I have reviewed the triage vital signs and the  nursing notes.  Pertinent labs & imaging results that were available during my care of the patient were reviewed by me and considered in my medical decision making (see chart for details).     47 year old female possible history of hypertension, diabetes who presents for evaluation of left upper extremity and left facial numbness that began at about 45 minutes prior to ED arrival.  It also feels like the arm slightly weak.  She is described as "the arm not feeling like myself."  No vision changes, difficulty speaking.  Reported chest pain when initially started but now has resolved.  Is very anxious appearing vital signs reviewed.  She is tachycardic and slightly hypertensive.  Consider CVA versus anxiety.  History/physical exam is not concerning for intracranial hemorrhage, Bell's palsy.  Do not suspect hypertensive emergency given history/physical exam.  We will plan for labs, CT head.  Urine drug screen negative.  Troponin normal.  Ethanol level unremarkable.  Urine pregnancy negative.  UA negative for any acute abnormalities.  Patient's potassium 3.2, glucose of 125.  Otherwise unremarkable.  CT head negative for any acute abnormalities.  Discussed patient with Dr. Stark Jock for independent assessment.  Patient will likely need transfer to outside facility for MRI to rule out CVA.  Neuro on call paged.   Discussed patient with Dr. Venora Maples [facility] ED) who accepts patient for transfer.   Discussed patient with Dr. Cheral Marker (Neuro).  Agrees with plan for transfer and MRI.  Neurology can be consulted as needed.  No indication for TPA at this time.   Final Clinical Impressions(s) / ED Diagnoses   Final diagnoses:  Left facial numbness  Left upper extremity numbness    ED Discharge Orders    None       Desma Mcgregor 07/31/18 2342      Veryl Speak, MD 08/01/18 [phone]

## 2018-07-31 NOTE — ED Notes (Signed)
Pt taken to MRI  

## 2018-07-31 NOTE — ED Notes (Signed)
Report to Jamie, charge RN at [facility] ED. 

## 2018-07-31 NOTE — ED Triage Notes (Addendum)
Pt amb to triage w/o difficulty  c/o  left side arm and facial numbness and tingling x 45 mins .

## 2018-07-31 NOTE — ED Provider Notes (Signed)
Ct Head Wo Contrast  Result Date: 07/31/2018 CLINICAL DATA:  Left facial droop and numbness. EXAM: CT HEAD WITHOUT CONTRAST TECHNIQUE: Contiguous axial images were obtained from the base of the skull through the vertex without intravenous contrast. COMPARISON:  09/18/2014 FINDINGS: Brain: The brainstem, cerebellum, cerebral peduncles, thalami, basal ganglia, basilar cisterns, and ventricular system appear within normal limits. No intracranial hemorrhage, mass lesion, or acute CVA. Vascular: Unremarkable Skull: Unremarkable Sinuses/Orbits: Unremarkable Other: No supplemental non-categorized findings. IMPRESSION: 1. No significant intracranial abnormality is observed. Electronically Signed   By: Van Clines M.D.   On: 07/31/2018 17:37   Mr Michele [name] PI Contrast  Result Date: 07/31/2018 CLINICAL DATA:  Initial evaluation for acute left-sided facial droop and numbness. EXAM: MRI HEAD WITHOUT CONTRAST MRA HEAD WITHOUT CONTRAST TECHNIQUE: Multiplanar, multiecho pulse sequences of the brain and surrounding structures were obtained without intravenous contrast. Angiographic images of the head were obtained using MRA technique without contrast. COMPARISON:  Prior CT from earlier the same day. FINDINGS: MRI HEAD FINDINGS Brain: Cerebral volume within normal limits for patient age. Few scattered subcentimeter T2/FLAIR hyperintensity seen involving the periventricular, deep, and subcortical white matter both cerebral hemispheres, nonspecific, but mild for age. No abnormal foci of restricted diffusion to suggest acute or subacute ischemia. Gray-white matter differentiation well maintained. No encephalomalacia to suggest chronic infarction. No foci of susceptibility artifact to suggest acute or chronic intracranial hemorrhage. Mass lesion, midline shift or mass effect. No hydrocephalus. No extra-axial fluid collection. Major dural sinuses are grossly patent. 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 Midline  structures intact and normal. Vascular: Major intracranial vascular flow voids well maintained and normal in appearance. Skull and upper cervical spine: Craniocervical junction normal. Visualized upper cervical spine within normal limits. Bone marrow signal intensity normal. No scalp soft tissue abnormality. Sinuses/Orbits: Globes and orbital soft tissues within normal limits. Paranasal sinuses are clear. No mastoid effusion. Inner ear structures normal. Other: None. MRA HEAD FINDINGS ANTERIOR CIRCULATION: Internal carotid arteries widely patent to the termini without stenosis. Origin of the ophthalmic arteries patent bilaterally. A1 segments, anterior communicating artery, and anterior cerebral arteries widely patent without stenosis. M1 segments widely patent bilaterally. Normal MCA bifurcations. No proximal M2 occlusion. Distal MCA branches well perfused and symmetric. POSTERIOR CIRCULATION: Vertebral arteries patent to the vertebrobasilar junction without stenosis. Left PICA patent. Right PICA not visualized. Basilar widely patent to its distal aspect without stenosis. Superior cerebral arteries patent bilaterally. Both of the posterior cerebral arteries primarily supplied via the basilar in are widely patent to their distal aspects without stenosis. No intracranial aneurysm. IMPRESSION: MRI HEAD IMPRESSION: 1. No acute intracranial abnormality. 2. Scattered nonspecific cerebral white matter changes, mild for age. Differential considerations include sequelae of chronic small vessel ischemic disease, complicated migraine, vasculitis, or prior infectious or inflammatory process. Demyelinating disease could also be considered, although appearance of these foci not be classic for this. MRA HEAD IMPRESSION: Normal intracranial MRA. Electronically Signed   By: Jeannine Boga M.D.   On: 07/31/2018 22:01   Mr Brain Wo Contrast  Result Date: 07/31/2018 CLINICAL DATA:  Initial evaluation for acute left-sided  facial droop and numbness. EXAM: MRI HEAD WITHOUT CONTRAST MRA HEAD WITHOUT CONTRAST TECHNIQUE: Multiplanar, multiecho pulse sequences of the brain and surrounding structures were obtained without intravenous contrast. Angiographic images of the head were obtained using MRA technique without contrast. COMPARISON:  Prior CT from earlier the same day. FINDINGS: MRI HEAD FINDINGS Brain: Cerebral volume within normal limits for patient age. Few  scattered subcentimeter T2/FLAIR hyperintensity seen involving the periventricular, deep, and subcortical white matter both cerebral hemispheres, nonspecific, but mild for age. No abnormal foci of restricted diffusion to suggest acute or subacute ischemia. Gray-white matter differentiation well maintained. No encephalomalacia to suggest chronic infarction. No foci of susceptibility artifact to suggest acute or chronic intracranial hemorrhage. Mass lesion, midline shift or mass effect. No hydrocephalus. No extra-axial fluid collection. Major dural sinuses are grossly patent. 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 Midline structures intact and normal. Vascular: Major intracranial vascular flow voids well maintained and normal in appearance. Skull and upper cervical spine: Craniocervical junction normal. Visualized upper cervical spine within normal limits. Bone marrow signal intensity normal. No scalp soft tissue abnormality. Sinuses/Orbits: Globes and orbital soft tissues within normal limits. Paranasal sinuses are clear. No mastoid effusion. Inner ear structures normal. Other: None. MRA HEAD FINDINGS ANTERIOR CIRCULATION: Internal carotid arteries widely patent to the termini without stenosis. Origin of the ophthalmic arteries patent bilaterally. A1 segments, anterior communicating artery, and anterior cerebral arteries widely patent without stenosis. M1 segments widely patent bilaterally. Normal MCA bifurcations. No proximal M2 occlusion. Distal MCA branches well  perfused and symmetric. POSTERIOR CIRCULATION: Vertebral arteries patent to the vertebrobasilar junction without stenosis. Left PICA patent. Right PICA not visualized. Basilar widely patent to its distal aspect without stenosis. Superior cerebral arteries patent bilaterally. Both of the posterior cerebral arteries primarily supplied via the basilar in are widely patent to their distal aspects without stenosis. No intracranial aneurysm. IMPRESSION: MRI HEAD IMPRESSION: 1. No acute intracranial abnormality. 2. Scattered nonspecific cerebral white matter changes, mild for age. Differential considerations include sequelae of chronic small vessel ischemic disease, complicated migraine, vasculitis, or prior infectious or inflammatory process. Demyelinating disease could also be considered, although appearance of these foci not be classic for this. MRA HEAD IMPRESSION: Normal intracranial MRA. Electronically Signed   By: Jeannine Boga M.D.   On: 07/31/2018 22:01     MRI reassuring. Suspect complicated migraine   Michele Schmidt, MD 07/31/18 2215

## 2018-07-31 NOTE — ED Notes (Signed)
Pt returned from MRI °

## 2018-07-31 NOTE — ED Notes (Signed)
Pt arrived by [facility] as transfer for MRI. Per [facility], no arm drift, weaker L sided grip and twitching to L eye. Pt able to pivot from stretcher to stretcher without difficulty

## 2018-08-05 DIAGNOSIS — G5601 Carpal tunnel syndrome, right upper limb: Secondary | ICD-10-CM | POA: Diagnosis not present

## 2018-08-05 DIAGNOSIS — M7541 Impingement syndrome of right shoulder: Secondary | ICD-10-CM | POA: Diagnosis not present

## 2018-08-06 ENCOUNTER — Ambulatory Visit: Payer: BLUE CROSS/BLUE SHIELD | Admitting: Family

## 2018-08-06 DIAGNOSIS — Z0289 Encounter for other administrative examinations: Secondary | ICD-10-CM

## 2018-08-07 ENCOUNTER — Encounter: Payer: Self-pay | Admitting: Family

## 2018-08-20 DIAGNOSIS — M5412 Radiculopathy, cervical region: Secondary | ICD-10-CM | POA: Diagnosis not present

## 2018-08-20 DIAGNOSIS — G43719 Chronic migraine without aura, intractable, without status migrainosus: Secondary | ICD-10-CM | POA: Diagnosis not present

## 2018-08-20 DIAGNOSIS — M5417 Radiculopathy, lumbosacral region: Secondary | ICD-10-CM | POA: Diagnosis not present

## 2018-08-20 DIAGNOSIS — G518 Other disorders of facial nerve: Secondary | ICD-10-CM | POA: Diagnosis not present

## 2018-08-20 DIAGNOSIS — R252 Cramp and spasm: Secondary | ICD-10-CM | POA: Diagnosis not present

## 2018-08-20 DIAGNOSIS — R5383 Other fatigue: Secondary | ICD-10-CM | POA: Diagnosis not present

## 2018-08-20 DIAGNOSIS — G5603 Carpal tunnel syndrome, bilateral upper limbs: Secondary | ICD-10-CM | POA: Diagnosis not present

## 2018-08-20 DIAGNOSIS — R201 Hypoesthesia of skin: Secondary | ICD-10-CM | POA: Diagnosis not present

## 2018-08-26 ENCOUNTER — Other Ambulatory Visit (HOSPITAL_COMMUNITY): Payer: Self-pay | Admitting: Specialist

## 2018-08-26 DIAGNOSIS — M5412 Radiculopathy, cervical region: Secondary | ICD-10-CM

## 2018-08-27 DIAGNOSIS — M47812 Spondylosis without myelopathy or radiculopathy, cervical region: Secondary | ICD-10-CM | POA: Diagnosis not present

## 2018-08-27 DIAGNOSIS — M50222 Other cervical disc displacement at C5-C6 level: Secondary | ICD-10-CM | POA: Diagnosis not present

## 2018-08-27 DIAGNOSIS — M47813 Spondylosis without myelopathy or radiculopathy, cervicothoracic region: Secondary | ICD-10-CM | POA: Diagnosis not present

## 2018-08-27 DIAGNOSIS — M5412 Radiculopathy, cervical region: Secondary | ICD-10-CM | POA: Diagnosis not present

## 2018-08-28 DIAGNOSIS — R42 Dizziness and giddiness: Secondary | ICD-10-CM | POA: Diagnosis not present

## 2018-09-06 DIAGNOSIS — F410 Panic disorder [episodic paroxysmal anxiety] without agoraphobia: Secondary | ICD-10-CM | POA: Diagnosis not present

## 2018-09-12 DIAGNOSIS — M5412 Radiculopathy, cervical region: Secondary | ICD-10-CM | POA: Diagnosis not present

## 2018-09-12 DIAGNOSIS — G43719 Chronic migraine without aura, intractable, without status migrainosus: Secondary | ICD-10-CM | POA: Diagnosis not present

## 2018-09-12 DIAGNOSIS — M5417 Radiculopathy, lumbosacral region: Secondary | ICD-10-CM | POA: Diagnosis not present

## 2018-09-12 DIAGNOSIS — G9389 Other specified disorders of brain: Secondary | ICD-10-CM | POA: Diagnosis not present

## 2018-09-12 DIAGNOSIS — G9349 Other encephalopathy: Secondary | ICD-10-CM | POA: Diagnosis not present

## 2018-09-12 DIAGNOSIS — R201 Hypoesthesia of skin: Secondary | ICD-10-CM | POA: Diagnosis not present

## 2018-09-13 ENCOUNTER — Telehealth: Payer: Self-pay | Admitting: Family

## 2018-09-13 ENCOUNTER — Ambulatory Visit (INDEPENDENT_AMBULATORY_CARE_PROVIDER_SITE_OTHER): Payer: BLUE CROSS/BLUE SHIELD | Admitting: Family

## 2018-09-13 ENCOUNTER — Other Ambulatory Visit: Payer: Self-pay

## 2018-09-13 VITALS — BP 124/89 | HR 86 | Temp 97.8°F | Resp 16 | Ht 67.0 in | Wt 199.0 lb

## 2018-09-13 DIAGNOSIS — Z0000 Encounter for general adult medical examination without abnormal findings: Secondary | ICD-10-CM | POA: Diagnosis not present

## 2018-09-13 DIAGNOSIS — E119 Type 2 diabetes mellitus without complications: Secondary | ICD-10-CM | POA: Diagnosis not present

## 2018-09-13 DIAGNOSIS — E875 Hyperkalemia: Secondary | ICD-10-CM

## 2018-09-13 DIAGNOSIS — Z23 Encounter for immunization: Secondary | ICD-10-CM | POA: Diagnosis not present

## 2018-09-13 DIAGNOSIS — F32A Depression, unspecified: Secondary | ICD-10-CM

## 2018-09-13 DIAGNOSIS — F329 Major depressive disorder, single episode, unspecified: Secondary | ICD-10-CM | POA: Diagnosis not present

## 2018-09-13 DIAGNOSIS — G4733 Obstructive sleep apnea (adult) (pediatric): Secondary | ICD-10-CM

## 2018-09-13 LAB — CBC WITH DIFFERENTIAL/PLATELET
BASOS ABS: 0 10*3/uL (ref 0.0–0.1)
BASOS PCT: 0.1 % (ref 0.0–3.0)
EOS ABS: 0.1 10*3/uL (ref 0.0–0.7)
Eosinophils Relative: 0.7 % (ref 0.0–5.0)
HCT: 43.4 % (ref 36.0–46.0)
HEMOGLOBIN: 14.5 g/dL (ref 12.0–15.0)
Lymphocytes Relative: 38.2 % (ref 12.0–46.0)
Lymphs Abs: 2.8 10*3/uL (ref 0.7–4.0)
MCHC: 33.3 g/dL (ref 30.0–36.0)
MCV: 86.4 fl (ref 78.0–100.0)
MONO ABS: 0.5 10*3/uL (ref 0.1–1.0)
Monocytes Relative: 6.3 % (ref 3.0–12.0)
Neutro Abs: 4 10*3/uL (ref 1.4–7.7)
Neutrophils Relative %: 54.7 % (ref 43.0–77.0)
Platelets: 223 10*3/uL (ref 150.0–400.0)
RBC: 5.02 Mil/uL (ref 3.87–5.11)
RDW: 13.8 % (ref 11.5–15.5)
WBC: 7.2 10*3/uL (ref 4.0–10.5)

## 2018-09-13 LAB — HEMOGLOBIN A1C: Hgb A1c MFr Bld: 5.7 % (ref 4.6–6.5)

## 2018-09-13 LAB — BASIC METABOLIC PANEL
BUN: 13 mg/dL (ref 6–23)
CHLORIDE: 104 meq/L (ref 96–112)
CO2: 29 mEq/L (ref 19–32)
CREATININE: 0.84 mg/dL (ref 0.40–1.20)
Calcium: 9.6 mg/dL (ref 8.4–10.5)
GFR: 92.69 mL/min (ref >60.00)
GLUCOSE: 97 mg/dL (ref 70–99)
POTASSIUM: 5.2 meq/L — AB (ref 3.5–5.1)
Sodium: 140 mEq/L (ref 135–145)

## 2018-09-13 LAB — HEPATIC FUNCTION PANEL
ALBUMIN: 4.3 g/dL (ref 3.5–5.2)
ALT: 16 U/L (ref 0–35)
AST: 13 U/L (ref 0–37)
Alkaline Phosphatase: 66 U/L (ref 39–117)
Bilirubin, Direct: 0.1 mg/dL (ref 0.0–0.3)
TOTAL PROTEIN: 6.8 g/dL (ref 6.0–8.3)
Total Bilirubin: 0.7 mg/dL (ref 0.2–1.2)

## 2018-09-13 LAB — LIPID PANEL
CHOLESTEROL: 195 mg/dL (ref 0–200)
HDL: 42.2 mg/dL (ref >39.00)
LDL CALC: 139 mg/dL — AB (ref 0–99)
NonHDL: 152.84
TRIGLYCERIDES: 71 mg/dL (ref 0.0–149.0)
Total CHOL/HDL Ratio: 5
VLDL: 14.2 mg/dL (ref 0.0–40.0)

## 2018-09-13 LAB — URINALYSIS, ROUTINE W REFLEX MICROSCOPIC
BILIRUBIN URINE: NEGATIVE
Hgb urine dipstick: NEGATIVE
KETONES UR: NEGATIVE
Nitrite: NEGATIVE
RBC / HPF: NONE SEEN (ref 0–?)
SPECIFIC GRAVITY, URINE: 1.015 (ref 1.000–1.030)
Total Protein, Urine: NEGATIVE
UROBILINOGEN UA: 0.2 (ref 0.0–1.0)
Urine Glucose: NEGATIVE
pH: 6 (ref 5.0–8.0)

## 2018-09-13 LAB — TSH: TSH: 1.01 u[IU]/mL (ref 0.35–4.50)

## 2018-09-13 MED ORDER — ATORVASTATIN CALCIUM 20 MG PO TABS: 20.0000 mg | ORAL_TABLET | Freq: Every day | ORAL | 1 refills | Status: DC

## 2018-09-13 MED ORDER — HYDROCHLOROTHIAZIDE 25 MG PO TABS: 25.0000 mg | ORAL_TABLET | Freq: Every day | ORAL | 3 refills | Status: DC

## 2018-09-13 NOTE — Telephone Encounter (Signed)
Also, sugar looks good but cholesterol is above goal. Increase lipitor from 10mg  to 20mg .

## 2018-09-13 NOTE — Patient Instructions (Signed)
Please work on healthy diet, exercise and weight loss. You should be contacted about your appointment with Dr. Elsworth Soho. Please follow up with psychiatry to monitor your progress on cymbalta.

## 2018-09-13 NOTE — Progress Notes (Signed)
Subjective:    Patient ID: Michele [name], female    DOB: 1970-09-22, 48 y.o.   MRN: [id number]  HPI  Patient presents today for complete physical.  Immunizations: tetanus 2016, flu shot up to date, due for pneumovax Diet: not eating healthy- does a lot of snacking Wt Readings from Last 3 Encounters:  09/13/18 199 lb (90.3 kg)  07/31/18 195 lb (88.5 kg)  06/17/18 198 lb 9.6 oz (90.1 kg)  Exercise:  Not exercising Pap Smear: 02/09/17 Mammogram: 02/09/17, reports that she did one in June  MRI done 07/31/18- she is seeing Dr. Trula Ore at [facility] neuro.  Reports that she has numbness in the right arm.  Had nerve conduction study and was recommended that she have a cervical MRI. She had spinal tap done yesterday at [facility].   Saw psychiatry on 11/27. She placed her back on cymbalta but she was told to hold until after her spinal tap. Reports feeling "just so tired."   Review of Systems  Constitutional: Positive for unexpected weight change.  HENT: Negative for hearing loss and rhinorrhea.   Eyes: Negative for visual disturbance.  Respiratory: Negative for cough.   Cardiovascular: Negative for leg swelling.  Gastrointestinal: Positive for constipation. Negative for blood in stool and diarrhea.  Genitourinary: Negative for dysuria, frequency and hematuria.  Musculoskeletal: Negative for arthralgias and myalgias.  Skin: Negative for rash.  Neurological: Positive for headaches.       Following with neuro for headaches.    Hematological: Negative for adenopathy.  Psychiatric/Behavioral:       Mood is low.  Denies SI/HI   Past Medical History:  Diagnosis Date  . Anemia    iron deficient, microcytic, hypochromic  . Anxiety   . Chest pain    atypical  . Depression   . Diabetes mellitus without complication ([place])   . Fatigue   . Fatty liver 08/15/2013  . Fibroids    uterine  . Hypertension   . Migraines   . 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  Posey Pronto at [facility]- S/p FNA March/april of two dominant nodules- benign.  Following for annual thyroid US with Dr. Posey Pronto.    . Palpitations    recurrent  . 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 Tachycardia    unspecified     Social History   Socioeconomic History  . Marital status: Married    Spouse name: [name]  . Number of children: 2  . Years of education: Not on file  . Highest education level: Not on file  Occupational History  . Occupation: CLAIMS AGENT    Employer: [employer]  Social Needs  . Financial resource strain: Not on file  . Food insecurity:    Worry: Not on file    Inability: Not on file  . Transportation needs:    Medical: Not on file    Non-medical: Not on file  Tobacco Use  . Smoking status: Never Smoker  . Smokeless tobacco: Never Used  . Tobacco comment: never used tobacco  Substance and Sexual Activity  . Alcohol use: Yes    Comment: Once a month  . Drug use: No  . Sexual activity: Yes    Birth control/protection: None, Surgical  Lifestyle  . Physical activity:    Days per week: Not on file    Minutes per session: Not on file  . Stress: Not on file  Relationships  . Social connections:    Talks on  phone: Not on file    Gets together: Not on file    Attends religious service: Not on file    Active member of club or organization: Not on file    Attends meetings of clubs or organizations: Not on file    Relationship status: Not on file  . Intimate partner violence:    Fear of current or ex partner: Not on file    Emotionally abused: Not on file    Physically abused: Not on file    Forced sexual activity: Not on file  Other Topics Concern  . 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 APPENDECTOMY  03/2009  . BIOPSY  THYROID  November 28, 2009   Dr. Posey Pronto- endo  . DILATION AND CURETTAGE OF UTERUS  05/21/2011   Procedure: DILATATION AND CURETTAGE (D&C);  Surgeon: Elveria Royals;  Location: [facility];  Service: Gynecology;  Laterality: N/A;  dilitation and currettage/endometrial currettings  . TUBAL LIGATION  1997  . TUBOPLASTY / TUBOTUBAL ANASTOMOSIS  2005    Family History  Problem Relation Age of Onset  . Hypertension Mother   . Cancer Mother 27       breast  . Cancer Father        colon  . Stroke Brother        handicapped due to complications from spinal meningitis  . Seizures Brother   . Asthma Son   . Arthritis Maternal Grandmother   . Hypertension Maternal Grandmother   . Stroke Maternal Grandfather     Allergies  Allergen Reactions  . Amlodipine Shortness Of Breath, Anxiety, Palpitations and Other (See Comments)  . Bee Venom Anaphylaxis  . Bupropion Shortness Of Breath, Anxiety, Palpitations and Other (See Comments)  . Clonidine Derivatives     dizziness  . Hydralazine     dizziness  . Imitrex [Sumatriptan] Nausea Only  . Oxycodone Nausea Only    Current Outpatient Medications on File Prior to Visit  Medication Sig Dispense Refill  . ALPRAZolam (XANAX) 1 MG tablet Take 1 mg by mouth as needed. For anxiety    . fluticasone (FLONASE) 50 MCG/ACT nasal spray Place 2 sprays into both nostrils daily. 16 g 1  . ibuprofen (ADVIL,MOTRIN) 800 MG tablet Take 800 mg by mouth 3 (three) times daily.  0  . losartan (COZAAR) 100 MG tablet Take 1 tablet (100 mg total) by mouth daily. 30 tablet 5  . spironolactone (ALDACTONE) 25 MG tablet Take 1 tablet (25 mg total) by mouth daily. 30 tablet 5  . atorvastatin (LIPITOR) 10 MG tablet Take 1 tablet (10 mg total) by mouth daily. (Patient not taking: Reported on 09/13/2018) 30 tablet 5  . EPIPEN 2-PAK 0.3 MG/0.3ML SOAJ injection daily as needed (FOR ALLERGY).     . [DISCONTINUED] buPROPion (WELLBUTRIN XL) 150 MG 24 hr tablet Take 2 tablets (300 mg total) by  mouth daily. 60 tablet 1   No current facility-administered medications on file prior to visit.     BP 124/89 (BP Location: Left Arm, Patient Position: Sitting, Cuff Size: Small)   Pulse 86   Temp 97.8 F (36.6 C) (Oral)   Resp 16   Ht 5\' 7"  (1.702 m)   Wt 199 lb (  90.3 kg)   LMP 06/11/2013   SpO2 99%   BMI 31.17 kg/m        Objective:   Physical Exam Constitutional:      Appearance: She is well-developed.  HENT:     Head: Normocephalic and atraumatic.  Cardiovascular:     Rate and Rhythm: Normal rate and regular rhythm.     Heart sounds: Normal heart sounds. No murmur.  Pulmonary:     Effort: Pulmonary effort is normal. No respiratory distress.     Breath sounds: Normal breath sounds. No wheezing.  Skin:    General: Skin is warm and dry.  Psychiatric:        Behavior: Behavior normal.        Thought Content: Thought content normal.        Judgment: Judgment normal.           Assessment & Plan:  Preventative care-discussed healthy diet, exercise, and weight loss.  Pneumovax 23 today.  Mammogram is up-to-date per patient.  Will request report from her gynecologist.  Tetanus up-to-date.  Pap smear up-to-date.    Depression-this is uncontrolled.  She notes excessive fatigue.  I suspect that this is multifactorial in setting of uncontrolled depression and untreated sleep apnea.  She plans to start Cymbalta today per her psychiatrist.  I have advised her to follow-up as scheduled with psychiatry for ongoing management of her depression.  Obstructive sleep apnea-untreated.  Will refer back to her pulmonologist for ongoing management.  Diabetes type 2-will obtain follow-up A1c today.

## 2018-09-13 NOTE — Telephone Encounter (Signed)
Lm on mobile number for patient to be aware detailed information on MyChart message about results, med change and lab appointment.

## 2018-09-13 NOTE — Telephone Encounter (Signed)
K+ is elevated. D/c aldactone, begin hctz instead. Follow up in 1 week with nurse for bp check and repeat bmet.

## 2018-09-16 MED ORDER — ROSUVASTATIN CALCIUM 5 MG PO TABS: 5.0000 mg | ORAL_TABLET | Freq: Every day | ORAL | 3 refills | Status: DC

## 2018-09-16 NOTE — Telephone Encounter (Signed)
Called but unable to leave message. Last note from [facility] sent to her as a Therapist, music.

## 2018-09-16 NOTE — Telephone Encounter (Signed)
Please advise pt to try crestor 5mg  once daily in place of lipitor to see if she can better tolerate.

## 2018-09-16 NOTE — Telephone Encounter (Signed)
The Lipitor makes my head hurts. I am  not sure I can take it   This was the patient response in MyChart, please advise.

## 2018-09-26 DIAGNOSIS — G5603 Carpal tunnel syndrome, bilateral upper limbs: Secondary | ICD-10-CM | POA: Diagnosis not present

## 2018-09-26 DIAGNOSIS — R201 Hypoesthesia of skin: Secondary | ICD-10-CM | POA: Diagnosis not present

## 2018-09-26 DIAGNOSIS — M542 Cervicalgia: Secondary | ICD-10-CM | POA: Diagnosis not present

## 2018-09-26 DIAGNOSIS — G43719 Chronic migraine without aura, intractable, without status migrainosus: Secondary | ICD-10-CM | POA: Diagnosis not present

## 2018-09-26 DIAGNOSIS — M5417 Radiculopathy, lumbosacral region: Secondary | ICD-10-CM | POA: Diagnosis not present

## 2018-10-01 DIAGNOSIS — G9349 Other encephalopathy: Secondary | ICD-10-CM | POA: Diagnosis not present

## 2018-10-01 DIAGNOSIS — G9389 Other specified disorders of brain: Secondary | ICD-10-CM | POA: Diagnosis not present

## 2018-10-29 ENCOUNTER — Encounter: Payer: Self-pay | Admitting: Family Medicine

## 2018-10-29 ENCOUNTER — Ambulatory Visit: Payer: BLUE CROSS/BLUE SHIELD | Admitting: Family Medicine

## 2018-10-29 VITALS — BP 130/84 | HR 112 | Temp 97.9°F | Ht 67.0 in | Wt 197.6 lb

## 2018-10-29 DIAGNOSIS — R6889 Other general symptoms and signs: Secondary | ICD-10-CM

## 2018-10-29 LAB — POCT INFLUENZA A/B
INFLUENZA A, POC: NEGATIVE
Influenza B, POC: NEGATIVE

## 2018-10-29 MED ORDER — GUAIFENESIN-CODEINE 100-10 MG/5ML PO SOLN: 5.0000 mL | Freq: Three times a day (TID) | ORAL | 0 refills | Status: DC | PRN

## 2018-10-29 NOTE — Assessment & Plan Note (Signed)
Has been at the hospital recently. Negative flu test. Did not receive the flu vaccine.  - cough syrup  - counseled on supportive care - given indications to follow up and return.

## 2018-10-29 NOTE — Patient Instructions (Addendum)
Nice to meet you  Please try things such as zyrtec-D or allegra-D which is an antihistamine and decongestant.  Please try afrin which will help with nasal congestion but use for only three days.  Please also try using a netti pot on a regular occasion. Please try honey, vick's vapor rub, lozenges and humidifer for cough and sore throat  Please follow up if you develop a fever or if your illness lasts longer than 3 days.

## 2018-10-29 NOTE — Progress Notes (Signed)
Michele [name] - 48 y.o. female MRN [id number]  Date of birth: 28-Jun-1971  SUBJECTIVE:  Including CC & ROS.  Chief Complaint  Patient presents with  . Cough    cough non productive, sore throat, runny nose, achy, chills/ 1day/ OTC musinex    AWA BACHICHA is a 48 y.o. female that is presenting with cough, congestion and sore throat.  Symptoms been present for 1 day.  She has been accompanying her husband hospital where he was admitted.  She denies any fevers.  Having cough that is worse at night.  Her cough is nonproductive.  Has some sinus frontal pressure.   Review of Systems  Constitutional: Positive for activity change. Negative for fever.  HENT: Positive for congestion and sinus pressure.   Respiratory: Positive for cough.   Cardiovascular: Negative for chest pain.  Gastrointestinal: Negative for abdominal pain.  Musculoskeletal: Negative for joint swelling.  Skin: Negative for color change.    HISTORY: Past Medical, Surgical, Social, and Family History Reviewed & Updated per EMR.   Pertinent Historical Findings include:  Past Medical History:  Diagnosis Date  . Anemia    iron deficient, microcytic, hypochromic  . Anxiety   . Chest pain    atypical  . Depression   . Diabetes mellitus without complication ([place])   . Fatigue   . Fatty liver 08/15/2013  . Fibroids    uterine  . Hypertension   . Migraines   . 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 Posey Pronto at [facility]- S/p FNA March/april of two dominant nodules- benign.  Following for annual thyroid US with Dr. Posey Pronto.    . Palpitations    recurrent  . 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 Tachycardia    unspecified    Past Surgical History:  Procedure Laterality Date  . APPENDECTOMY  03/2009  . BIOPSY THYROID  November 28, 2009   Dr. Posey Pronto- endo  . DILATION AND CURETTAGE OF UTERUS  05/21/2011   Procedure: DILATATION AND CURETTAGE (D&C);  Surgeon: Elveria Royals;  Location: [facility];  Service: Gynecology;  Laterality: N/A;  dilitation and currettage/endometrial currettings  . TUBAL LIGATION  1997  . TUBOPLASTY / TUBOTUBAL ANASTOMOSIS  2005    Allergies  Allergen Reactions  . Amlodipine Shortness Of Breath, Anxiety, Palpitations and Other (See Comments)  . Bee Venom Anaphylaxis  . Bupropion Shortness Of Breath, Anxiety, Palpitations and Other (See Comments)  . Clonidine Derivatives     dizziness  . Hydralazine     dizziness  . Imitrex [Sumatriptan] Nausea Only  . Oxycodone Nausea Only    Family History  Problem Relation Age of Onset  . Hypertension Mother   . Cancer Mother 69       breast  . Cancer Father        colon  . Stroke Brother        handicapped due to complications from spinal meningitis  . Seizures Brother   . Asthma Son   . Arthritis Maternal Grandmother   . Hypertension Maternal Grandmother   . Stroke Maternal Grandfather      Social History   Socioeconomic History  . Marital status: Married    Spouse name: [name]  . Number of children: 2  . Years of education: Not on file  . Highest education level: Not on file  Occupational History  . Occupation: CLAIMS AGENT    Employer: [employer]  Social Needs  . Financial resource strain: Not  on file  . Food insecurity:    Worry: Not on file    Inability: Not on file  . Transportation needs:    Medical: Not on file    Non-medical: Not on file  Tobacco Use  . Smoking status: Never Smoker  . Smokeless tobacco: Never Used  . Tobacco comment: never used tobacco  Substance and Sexual Activity  . Alcohol use: Yes    Comment: Once a month  . Drug use: No  . Sexual activity: Yes    Birth control/protection: None, Surgical  Lifestyle  . Physical activity:    Days per week: Not on file    Minutes per session: Not on file  . Stress: Not on file  Relationships  . Social connections:    Talks on phone: Not on file    Gets together: Not on file    Attends religious service: Not  on file    Active member of club or organization: Not on file    Attends meetings of clubs or organizations: Not on file    Relationship status: Not on file  . Intimate partner violence:    Fear of current or ex partner: Not on file    Emotionally abused: Not on file    Physically abused: Not on file    Forced sexual activity: Not on file  Other Topics Concern  . 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   Lives with husband, 22 year old son, and granddaughter/grandson   Regular exercise: yes   Daily caffeine: 1-2 daily           PHYSICAL EXAM:  VS: BP 130/84   Pulse (!) 112   Temp 97.9 F (36.6 C) (Oral)   Ht 5\' 7"  (1.702 m)   Wt 197 lb 9.6 oz (89.6 kg)   LMP 06/11/2013   SpO2 98%   BMI 30.95 kg/m  Physical Exam Gen: NAD, alert, cooperative with exam,  ENT: normal lips, normal nasal mucosa, tympanic membranes clear and intact bilaterally, normal oropharynx, no cervical lymphadenopathy Eye: normal EOM, normal conjunctiva and lids CV:  no edema, +2 pedal pulses, regular rhythm, S1-S2   Resp: no accessory muscle use, non-labored, clear to auscultation bilaterally, no crackles or wheezes Skin: no rashes, no areas of induration  Neuro: normal tone, normal sensation to touch Psych:  normal insight, alert and oriented MSK: Normal gait, normal strength       ASSESSMENT & PLAN:   Flu-like symptoms Has been at the hospital recently. Negative flu test. Did not receive the flu vaccine.  - cough syrup  - counseled on supportive care - given indications to follow up and return.

## 2018-10-31 ENCOUNTER — Telehealth: Payer: Self-pay | Admitting: Family

## 2018-10-31 NOTE — Telephone Encounter (Signed)
Copied from [place] [id number] [id number]. Topic: Quick Communication - See Telephone Encounter >> Oct 31, 2018 11:25 AM Vernona Rieger wrote: CRM for notification. See Telephone encounter for: 10/31/18.  Pt seen Clear[facility] Coots on 2/18 and was given guaiFENesin-codeine 100-10 MG/5ML syrup, she said it is not helping at all with her cough and would like to know if something else could be called in for her. Please Advise  [facility], [place] - [address] [place] [address] Phone: [phone] Fax: [phone]

## 2018-11-18 DIAGNOSIS — R52 Pain, unspecified: Secondary | ICD-10-CM | POA: Diagnosis not present

## 2018-11-18 DIAGNOSIS — M5410 Radiculopathy, site unspecified: Secondary | ICD-10-CM | POA: Diagnosis not present

## 2018-12-02 ENCOUNTER — Other Ambulatory Visit: Payer: Self-pay | Admitting: Family

## 2018-12-02 MED ORDER — HYDROCHLOROTHIAZIDE 25 MG PO TABS: 25.0000 mg | ORAL_TABLET | Freq: Every day | ORAL | 1 refills | Status: DC

## 2018-12-02 NOTE — Telephone Encounter (Signed)
Refills have been sent.  

## 2018-12-02 NOTE — Telephone Encounter (Signed)
Copied from [place] #[id number]. Topic: Quick Communication - Rx Refill/Question >> Dec 02, 2018  9:29 AM Richardo Priest, NT wrote: Medication: hydrochlorothiazide (HYDRODIURIL) 25 MG tablet  Has the patient contacted their pharmacy?   Preferred Pharmacy (with phone number or street name):  [place], [place] - [address] [phone] (Phone) [phone] (Fax)  Agent: Please be advised that RX refills may take up to 3 business days. We ask that you follow-up with your pharmacy.

## 2018-12-13 ENCOUNTER — Ambulatory Visit: Payer: Self-pay | Admitting: Family

## 2018-12-16 ENCOUNTER — Other Ambulatory Visit: Payer: Self-pay

## 2018-12-16 ENCOUNTER — Telehealth: Payer: Self-pay | Admitting: Family

## 2018-12-16 ENCOUNTER — Institutional Professional Consult (permissible substitution): Payer: Self-pay | Admitting: Pulmonary Disease

## 2018-12-16 ENCOUNTER — Ambulatory Visit: Payer: BLUE CROSS/BLUE SHIELD | Admitting: Family

## 2018-12-29 ENCOUNTER — Other Ambulatory Visit: Payer: Self-pay | Admitting: Family

## 2019-03-12 ENCOUNTER — Other Ambulatory Visit: Payer: Self-pay

## 2019-03-12 ENCOUNTER — Ambulatory Visit (INDEPENDENT_AMBULATORY_CARE_PROVIDER_SITE_OTHER): Payer: BC Managed Care – PPO | Admitting: Family

## 2019-03-12 DIAGNOSIS — I10 Essential (primary) hypertension: Secondary | ICD-10-CM | POA: Diagnosis not present

## 2019-03-12 DIAGNOSIS — E119 Type 2 diabetes mellitus without complications: Secondary | ICD-10-CM | POA: Diagnosis not present

## 2019-03-12 DIAGNOSIS — F419 Anxiety disorder, unspecified: Secondary | ICD-10-CM

## 2019-03-12 DIAGNOSIS — F329 Major depressive disorder, single episode, unspecified: Secondary | ICD-10-CM

## 2019-03-12 DIAGNOSIS — F32A Depression, unspecified: Secondary | ICD-10-CM

## 2019-03-12 DIAGNOSIS — E785 Hyperlipidemia, unspecified: Secondary | ICD-10-CM

## 2019-03-12 MED ORDER — HYDROCHLOROTHIAZIDE 25 MG PO TABS: 25.0000 mg | ORAL_TABLET | Freq: Every day | ORAL | 1 refills | Status: DC

## 2019-03-12 MED ORDER — LOSARTAN POTASSIUM 100 MG PO TABS: 100.0000 mg | ORAL_TABLET | Freq: Every day | ORAL | 1 refills | Status: DC

## 2019-03-12 NOTE — Progress Notes (Signed)
Virtual Visit via Video Note  I connected with Michele [name] on 03/12/19 at 11:00 AM EDT by a video enabled telemedicine application and verified that I am speaking with the correct person using two identifiers.  Location: Patient: home Provider: work   I discussed the limitations of evaluation and management by telemedicine and the availability of in person appointments. The patient expressed understanding and agreed to proceed.  History of Present Illness:  DM2- reports that she has been exercising more recently x 8 weeks.  30-45 minutes of exercise.  Has a treadmill.  Reports most recent weight is 192  Wt Readings from Last 3 Encounters:  10/29/18 197 lb 9.6 oz (89.6 kg)  09/13/18 199 lb (90.3 kg)  07/31/18 195 lb (88.5 kg)    Lab Results  Component Value Date   HGBA1C 5.7 09/13/2018   HGBA1C 6.0 06/17/2018   HGBA1C 5.9 10/04/2017   Lab Results  Component Value Date   MICROALBUR 0.9 07/04/2017   LDLCALC 139 (H) 09/13/2018   CREATININE 0.84 09/13/2018   HTN- BP meds include hctz, losartan  Home readings: 113/77 this am  BP Readings from Last 3 Encounters:  10/29/18 130/84  09/13/18 124/89  07/31/18 139/90   Hyperlipidemia- pt reports that she stopped taking crestor because she thought it was causing dizziness. Notes that she has some mild dizziness with sitting up even off of the crestor however.  Lab Results  Component Value Date   CHOL 195 09/13/2018   HDL 42.20 09/13/2018   LDLCALC 139 (H) 09/13/2018   TRIG 71.0 09/13/2018   CHOLHDL 5 09/13/2018   Past Medical History:  Diagnosis Date  . Anemia    iron deficient, microcytic, hypochromic  . Anxiety   . Chest pain    atypical  . Depression   . Diabetes mellitus without complication ([place])   . Fatigue   . Fatty liver 08/15/2013  . Fibroids    uterine  . Hypertension   . Migraines   . 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 Posey Pronto at [facility]- S/p FNA March/april of  two dominant nodules- benign.  Following for annual thyroid US with Dr. Posey Pronto.    . Palpitations    recurrent  . 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 Tachycardia    unspecified     Social History   Socioeconomic History  . Marital status: Married    Spouse name: [name]  . Number of children: 2  . Years of education: Not on file  . Highest education level: Not on file  Occupational History  . Occupation: CLAIMS AGENT    Employer: [employer]  Social Needs  . Financial resource strain: Not on file  . Food insecurity    Worry: Not on file    Inability: Not on file  . Transportation needs    Medical: Not on file    Non-medical: Not on file  Tobacco Use  . Smoking status: Never Smoker  . Smokeless tobacco: Never Used  . Tobacco comment: never used tobacco  Substance and Sexual Activity  . Alcohol use: Yes    Comment: Once a month  . Drug use: No  . Sexual activity: Yes    Birth control/protection: None, Surgical  Lifestyle  . Physical activity    Days per week: Not on file    Minutes per session: Not on file  . Stress: Not on file  Relationships  . Social Herbalist on phone:  Not on file    Gets together: Not on file    Attends religious service: Not on file    Active member of club or organization: Not on file    Attends meetings of clubs or organizations: Not on file    Relationship status: Not on file  . Intimate partner violence    Fear of current or ex partner: Not on file    Emotionally abused: Not on file    Physically abused: Not on file    Forced sexual activity: Not on file  Other Topics Concern  . 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 APPENDECTOMY  03/2009  . BIOPSY THYROID  November 28, 2009   Dr. Posey Pronto- endo  .  DILATION AND CURETTAGE OF UTERUS  05/21/2011   Procedure: DILATATION AND CURETTAGE (D&C);  Surgeon: Elveria Royals;  Location: [facility];  Service: Gynecology;  Laterality: N/A;  dilitation and currettage/endometrial currettings  . TUBAL LIGATION  1997  . TUBOPLASTY / TUBOTUBAL ANASTOMOSIS  2005    Family History  Problem Relation Age of Onset  . Hypertension Mother   . Cancer Mother 30       breast  . Cancer Father        colon  . Stroke Brother        handicapped due to complications from spinal meningitis  . Seizures Brother   . Asthma Son   . Arthritis Maternal Grandmother   . Hypertension Maternal Grandmother   . Stroke Maternal Grandfather     Allergies  Allergen Reactions  . Amlodipine Shortness Of Breath, Anxiety, Palpitations and Other (See Comments)  . Bee Venom Anaphylaxis  . Bupropion Shortness Of Breath, Anxiety, Palpitations and Other (See Comments)  . Clonidine Derivatives     dizziness  . Hydralazine     dizziness  . Imitrex [Sumatriptan] Nausea Only  . Oxycodone Nausea Only    Current Outpatient Medications on File Prior to Visit  Medication Sig Dispense Refill  . ALPRAZolam (XANAX) 1 MG tablet Take 1 mg by mouth as needed. For anxiety    . EPIPEN 2-PAK 0.3 MG/0.3ML SOAJ injection daily as needed (FOR ALLERGY).     Marland Kitchen guaiFENesin-codeine 100-10 MG/5ML syrup Take 5 mLs by mouth 3 (three) times daily as needed for cough. 60 mL 0  . ibuprofen (ADVIL,MOTRIN) 800 MG tablet Take 800 mg by mouth 3 (three) times daily.  0  . rosuvastatin (CRESTOR) 5 MG tablet Take 1 tablet (5 mg total) by mouth daily. (Patient not taking: Reported on 10/29/2018) 30 tablet 3  . [DISCONTINUED] buPROPion (WELLBUTRIN XL) 150 MG 24 hr tablet Take 2 tablets (300 mg total) by mouth daily. 60 tablet 1   No current facility-administered medications on file prior to visit.     LMP 06/11/2013    Observations/Objective:   Gen: Awake, alert, no acute distress Resp: Breathing is even and  non-labored Psych: calm/pleasant demeanor Neuro: Alert and Oriented x 3, + facial symmetry, speech is clear.   Assessment and Plan:  Hyperlipidemia- obtain follow up lipid panel. If above goal, plan to restart crestor. It sounds like she may be having some mild vertigo symptoms which are unrelated to her statin.  Anxiety/Depression- appears much  improved to me.  She is being managed by psychiatrt,  DM2- obtain follow up A1C. Encouraged continued weight loss.  HTN- bp appears stable on current meds.  Follow Up Instructions:    I discussed the assessment and treatment plan with the patient. The patient was provided an opportunity to ask questions and all were answered. The patient agreed with the plan and demonstrated an understanding of the instructions.   The patient was advised to call back or seek an in-person evaluation if the symptoms worsen or if the condition fails to improve as anticipated.  Nance Pear, NP

## 2019-05-01 ENCOUNTER — Other Ambulatory Visit: Payer: Self-pay

## 2019-05-01 DIAGNOSIS — Z20822 Contact with and (suspected) exposure to covid-19: Secondary | ICD-10-CM

## 2019-05-01 DIAGNOSIS — R6889 Other general symptoms and signs: Secondary | ICD-10-CM | POA: Diagnosis not present

## 2019-05-02 LAB — NOVEL CORONAVIRUS, NAA: SARS-CoV-2, NAA: NOT DETECTED

## 2019-05-14 ENCOUNTER — Telehealth: Payer: Self-pay | Admitting: Family

## 2019-05-14 ENCOUNTER — Ambulatory Visit (INDEPENDENT_AMBULATORY_CARE_PROVIDER_SITE_OTHER): Payer: BC Managed Care – PPO | Admitting: Family

## 2019-05-14 ENCOUNTER — Other Ambulatory Visit: Payer: Self-pay

## 2019-05-14 VITALS — BP 146/89 | HR 80

## 2019-05-14 DIAGNOSIS — R059 Cough, unspecified: Secondary | ICD-10-CM

## 2019-05-14 DIAGNOSIS — R05 Cough: Secondary | ICD-10-CM | POA: Diagnosis not present

## 2019-05-14 MED ORDER — BENZONATATE 100 MG PO CAPS: 100.0000 mg | ORAL_CAPSULE | Freq: Three times a day (TID) | ORAL | 0 refills | Status: DC | PRN

## 2019-05-14 MED ORDER — AZITHROMYCIN 250 MG PO TABS: ORAL_TABLET | ORAL | 0 refills | Status: DC

## 2019-05-14 NOTE — Telephone Encounter (Signed)
Could you please call pt to schedule a lab visit in 1 week?

## 2019-05-14 NOTE — Progress Notes (Signed)
Virtual Visit via Video Note  I connected with Michele [name] on 05/14/19 at 11:40 AM EDT by a video enabled telemedicine application and verified that I am speaking with the correct person using two identifiers.  Location: Patient: home Provider: home   I discussed the limitations of evaluation and management by telemedicine and the availability of in person appointments. The patient expressed understanding and agreed to proceed.  History of Present Illness:  Patient is a 48 yr old female who presents today with chief complaint of cough. Cough has been present for approximately 2 weeks.    Reports that she attended a  funeral on 04/27/19.  Reports that there were 100 people at that funeral.  Several days later she a developed cough, fever of 103.  She was tested on 8/20 for covid-19 and results were negative.    Still having a low grade fever.  Notes some right sided low back pain.  Temp 100.3 today. + cough. Intermittent coughing fits while she sleeps.  She denies SOB.  Feels like she has some chest congestion that she can't get up.   Past Medical History:  Diagnosis Date  . Anemia    iron deficient, microcytic, hypochromic  . Anxiety   . Chest pain    atypical  . Depression   . Diabetes mellitus without complication ([place])   . Fatigue   . Fatty liver 08/15/2013  . Fibroids    uterine  . Hypertension   . Migraines   . 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 Posey Pronto at [facility]- S/p FNA March/april of two dominant nodules- benign.  Following for annual thyroid US with Dr. Posey Pronto.    . Palpitations    recurrent  . 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 Tachycardia    unspecified     Social History   Socioeconomic History  . Marital status: Married    Spouse name: [name]  . Number of children: 2  . Years of education: Not on file  . Highest education level: Not on file  Occupational History  . Occupation: CLAIMS AGENT    Employer:  [employer]  Social Needs  . Financial resource strain: Not on file  . Food insecurity    Worry: Not on file    Inability: Not on file  . Transportation needs    Medical: Not on file    Non-medical: Not on file  Tobacco Use  . Smoking status: Never Smoker  . Smokeless tobacco: Never Used  . Tobacco comment: never used tobacco  Substance and Sexual Activity  . Alcohol use: Yes    Comment: Once a month  . Drug use: No  . Sexual activity: Yes    Birth control/protection: None, Surgical  Lifestyle  . Physical activity    Days per week: Not on file    Minutes per session: Not on file  . Stress: Not on file  Relationships  . Social Herbalist on phone: Not on file    Gets together: Not on file    Attends religious service: Not on file    Active member of club or organization: Not on file    Attends meetings of clubs or organizations: Not on file    Relationship status: Not on file  . Intimate partner violence    Fear of current or ex partner: Not on file    Emotionally abused: Not on file    Physically abused: Not on file  Forced sexual activity: Not on file  Other Topics Concern  . 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 APPENDECTOMY  03/2009  . BIOPSY THYROID  November 28, 2009   Dr. Posey Pronto- endo  . DILATION AND CURETTAGE OF UTERUS  05/21/2011   Procedure: DILATATION AND CURETTAGE (D&C);  Surgeon: Elveria Royals;  Location: [facility];  Service: Gynecology;  Laterality: N/A;  dilitation and currettage/endometrial currettings  . TUBAL LIGATION  1997  . TUBOPLASTY / TUBOTUBAL ANASTOMOSIS  2005    Family History  Problem Relation Age of Onset  . Hypertension Mother   . Cancer Mother 33       breast  . Cancer Father        colon  . Stroke Brother         handicapped due to complications from spinal meningitis  . Seizures Brother   . Asthma Son   . Arthritis Maternal Grandmother   . Hypertension Maternal Grandmother   . Stroke Maternal Grandfather     Allergies  Allergen Reactions  . Amlodipine Shortness Of Breath, Anxiety, Palpitations and Other (See Comments)  . Bee Venom Anaphylaxis  . Bupropion Shortness Of Breath, Anxiety, Palpitations and Other (See Comments)  . Clonidine Derivatives     dizziness  . Hydralazine     dizziness  . Imitrex [Sumatriptan] Nausea Only  . Oxycodone Nausea Only    Current Outpatient Medications on File Prior to Visit  Medication Sig Dispense Refill  . ALPRAZolam (XANAX) 1 MG tablet Take 1 mg by mouth as needed. For anxiety    . EPIPEN 2-PAK 0.3 MG/0.3ML SOAJ injection daily as needed (FOR ALLERGY).     Marland Kitchen guaiFENesin-codeine 100-10 MG/5ML syrup Take 5 mLs by mouth 3 (three) times daily as needed for cough. 60 mL 0  . hydrochlorothiazide (HYDRODIURIL) 25 MG tablet Take 1 tablet (25 mg total) by mouth daily. 90 tablet 1  . ibuprofen (ADVIL,MOTRIN) 800 MG tablet Take 800 mg by mouth 3 (three) times daily.  0  . losartan (COZAAR) 100 MG tablet Take 1 tablet (100 mg total) by mouth daily. 90 tablet 1  . rosuvastatin (CRESTOR) 5 MG tablet Take 1 tablet (5 mg total) by mouth daily. (Patient not taking: Reported on 10/29/2018) 30 tablet 3  . [DISCONTINUED] buPROPion (WELLBUTRIN XL) 150 MG 24 hr tablet Take 2 tablets (300 mg total) by mouth daily. 60 tablet 1   No current facility-administered medications on file prior to visit.     BP (!) 146/89   Pulse 80   LMP 06/11/2013   SpO2 99%      Observations/Objective:   Gen: Awake, alert, no acute distress Resp: Breathing is even and non-labored Psych: calm/pleasant demeanor Neuro: Alert and Oriented x 3, + facial symmetry, speech is clear.   Assessment and Plan:  Cough- hx concerning for pneumonia.  Advised pt to begin azithromycin and tessalon  prn cough. For chest congestion I recommendend trial of mucinex.  She is advised to call if new/worsening symptoms or if symptoms fail to improve.  Follow Up Instructions:    I discussed the assessment and treatment plan with the patient. The patient was provided an opportunity to ask questions and all were answered. The  patient agreed with the plan and demonstrated an understanding of the instructions.   The patient was advised to call back or seek an in-person evaluation if the symptoms worsen or if the condition fails to improve as anticipated.  Nance Pear, NP

## 2019-05-15 NOTE — Telephone Encounter (Signed)
Called pt 3 times no answer.. No option to leave message

## 2019-05-30 DIAGNOSIS — Z6833 Body mass index (BMI) 33.0-33.9, adult: Secondary | ICD-10-CM | POA: Diagnosis not present

## 2019-05-30 DIAGNOSIS — Z1231 Encounter for screening mammogram for malignant neoplasm of breast: Secondary | ICD-10-CM | POA: Diagnosis not present

## 2019-05-30 DIAGNOSIS — Z01419 Encounter for gynecological examination (general) (routine) without abnormal findings: Secondary | ICD-10-CM | POA: Diagnosis not present

## 2019-05-30 LAB — HM MAMMOGRAPHY

## 2019-06-24 ENCOUNTER — Emergency Department (HOSPITAL_BASED_OUTPATIENT_CLINIC_OR_DEPARTMENT_OTHER): Payer: BC Managed Care – PPO

## 2019-06-24 ENCOUNTER — Emergency Department (HOSPITAL_BASED_OUTPATIENT_CLINIC_OR_DEPARTMENT_OTHER)
Admission: EM | Admit: 2019-06-24 | Discharge: 2019-06-24 | Disposition: A | Discharge status: Home / Self Care | Payer: BC Managed Care – PPO | Attending: Emergency Medicine | Admitting: Emergency Medicine

## 2019-06-24 ENCOUNTER — Ambulatory Visit (HOSPITAL_BASED_OUTPATIENT_CLINIC_OR_DEPARTMENT_OTHER): Admission: RE | Admit: 2019-06-24 | Payer: BC Managed Care – PPO | Source: Ambulatory Visit

## 2019-06-24 ENCOUNTER — Ambulatory Visit: Payer: BC Managed Care – PPO | Admitting: Family

## 2019-06-24 ENCOUNTER — Encounter (HOSPITAL_BASED_OUTPATIENT_CLINIC_OR_DEPARTMENT_OTHER): Payer: Self-pay

## 2019-06-24 ENCOUNTER — Other Ambulatory Visit: Payer: Self-pay

## 2019-06-24 ENCOUNTER — Encounter: Payer: Self-pay | Admitting: Family

## 2019-06-24 VITALS — BP 140/85 | HR 82 | Temp 97.4°F | Resp 16 | Ht 64.0 in | Wt 195.0 lb

## 2019-06-24 DIAGNOSIS — I10 Essential (primary) hypertension: Secondary | ICD-10-CM | POA: Insufficient documentation

## 2019-06-24 DIAGNOSIS — R0789 Other chest pain: Secondary | ICD-10-CM | POA: Insufficient documentation

## 2019-06-24 DIAGNOSIS — R0602 Shortness of breath: Secondary | ICD-10-CM | POA: Insufficient documentation

## 2019-06-24 DIAGNOSIS — R079 Chest pain, unspecified: Secondary | ICD-10-CM | POA: Diagnosis not present

## 2019-06-24 DIAGNOSIS — Z20828 Contact with and (suspected) exposure to other viral communicable diseases: Secondary | ICD-10-CM | POA: Diagnosis not present

## 2019-06-24 DIAGNOSIS — R002 Palpitations: Secondary | ICD-10-CM | POA: Diagnosis not present

## 2019-06-24 DIAGNOSIS — Z23 Encounter for immunization: Secondary | ICD-10-CM

## 2019-06-24 DIAGNOSIS — E119 Type 2 diabetes mellitus without complications: Secondary | ICD-10-CM | POA: Insufficient documentation

## 2019-06-24 DIAGNOSIS — Z79899 Other long term (current) drug therapy: Secondary | ICD-10-CM | POA: Insufficient documentation

## 2019-06-24 LAB — BRAIN NATRIURETIC PEPTIDE: B Natriuretic Peptide: 9.5 pg/mL (ref 0.0–100.0)

## 2019-06-24 LAB — BASIC METABOLIC PANEL
Anion gap: 11 (ref 5–15)
BUN: 15 mg/dL (ref 6–20)
CO2: 27 mmol/L (ref 22–32)
Calcium: 9.5 mg/dL (ref 8.9–10.3)
Chloride: 102 mmol/L (ref 98–111)
Creatinine, Ser: 0.8 mg/dL (ref 0.44–1.00)
GFR calc Af Amer: >60 mL/min (ref >60)
GFR calc non Af Amer: >60 mL/min (ref >60)
Glucose, Bld: 103 mg/dL — ABNORMAL HIGH (ref 70–99)
Potassium: 3.8 mmol/L (ref 3.5–5.1)
Sodium: 140 mmol/L (ref 135–145)

## 2019-06-24 MED ORDER — IOHEXOL 350 MG/ML SOLN
75.0000 mL | Freq: Once | INTRAVENOUS | Status: AC | PRN
  Administered 2019-06-24: 75 mL via INTRAVENOUS

## 2019-06-24 NOTE — ED Notes (Signed)
Pt returned from CT °

## 2019-06-24 NOTE — Discharge Instructions (Signed)
Take tylenol 2 pills 4 times a day and motrin 4 pills 3 times a day.  Drink plenty of fluids.  Return for worsening shortness of breath, headache, confusion. Follow up with your family doctor.      Person Under Monitoring Name: Michele [name]  Location: [address] [place] [address]   Infection Prevention Recommendations for Individuals Confirmed to have, or Being Evaluated for, 2019 Novel Coronavirus (COVID-19) Infection Who Receive Care at Home  Individuals who are confirmed to have, or are being evaluated for, COVID-19 should follow the prevention steps below until a healthcare provider or local or state health department says they can return to normal activities.  Stay home except to get medical care You should restrict activities outside your home, except for getting medical care. Do not go to work, school, or public areas, and do not use public transportation or taxis.  Call ahead before visiting your doctor Before your medical appointment, call the healthcare provider and tell them that you have, or are being evaluated for, COVID-19 infection. This will help the healthcare providers office take steps to keep other people from getting infected. Ask your healthcare provider to call the local or state health department.  Monitor your symptoms Seek prompt medical attention if your illness is worsening (e.g., difficulty breathing). Before going to your medical appointment, call the healthcare provider and tell them that you have, or are being evaluated for, COVID-19 infection. Ask your healthcare provider to call the local or state health department.  Wear a facemask You should wear a facemask that covers your nose and mouth when you are in the same room with other people and when you visit a healthcare provider. People who live with or visit you should also wear a facemask while they are in the same room with you.  Separate yourself from other people in your  home As much as possible, you should stay in a different room from other people in your home. Also, you should use a separate bathroom, if available.  Avoid sharing household items You should not share dishes, drinking glasses, cups, eating utensils, towels, bedding, or other items with other people in your home. After using these items, you should wash them thoroughly with soap and water.  Cover your coughs and sneezes Cover your mouth and nose with a tissue when you cough or sneeze, or you can cough or sneeze into your sleeve. Throw used tissues in a lined trash can, and immediately wash your hands with soap and water for at least 20 seconds or use an alcohol-based hand rub.  Wash your [facility] your hands often and thoroughly with soap and water for at least 20 seconds. You can use an alcohol-based hand sanitizer if soap and water are not available and if your hands are not visibly dirty. Avoid touching your eyes, nose, and mouth with unwashed hands.   Prevention Steps for Caregivers and Household Members of Individuals Confirmed to have, or Being Evaluated for, COVID-19 Infection Being Cared for in the Home  If you live with, or provide care at home for, a person confirmed to have, or being evaluated for, COVID-19 infection please follow these guidelines to prevent infection:  Follow healthcare providers instructions Make sure that you understand and can help the patient follow any healthcare provider instructions for all care.  Provide for the patients basic needs You should help the patient with basic needs in the home and provide support for getting groceries, prescriptions, and other  personal needs.  Monitor the patients symptoms If they are getting sicker, call his or her medical provider and tell them that the patient has, or is being evaluated for, COVID-19 infection. This will help the healthcare providers office take steps to keep other people from getting  infected. Ask the healthcare provider to call the local or state health department.  Limit the number of people who have contact with the patient If possible, have only one caregiver for the patient. Other household members should stay in another home or place of residence. If this is not possible, they should stay in another room, or be separated from the patient as much as possible. Use a separate bathroom, if available. Restrict visitors who do not have an essential need to be in the home.  Keep older adults, very young children, and other sick people away from the patient Keep older adults, very young children, and those who have compromised immune systems or chronic health conditions away from the patient. This includes people with chronic heart, lung, or kidney conditions, diabetes, and cancer.  Ensure good ventilation Make sure that shared spaces in the home have good air flow, such as from an air conditioner or an opened window, weather permitting.  Wash your hands often Wash your hands often and thoroughly with soap and water for at least 20 seconds. You can use an alcohol based hand sanitizer if soap and water are not available and if your hands are not visibly dirty. Avoid touching your eyes, nose, and mouth with unwashed hands. Use disposable paper towels to dry your hands. If not available, use dedicated cloth towels and replace them when they become wet.  Wear a facemask and gloves Wear a disposable facemask at all times in the room and gloves when you touch or have contact with the patients blood, body fluids, and/or secretions or excretions, such as sweat, saliva, sputum, nasal mucus, vomit, urine, or feces.  Ensure the mask fits over your nose and mouth tightly, and do not touch it during use. Throw out disposable facemasks and gloves after using them. Do not reuse. Wash your hands immediately after removing your facemask and gloves. If your personal clothing becomes  contaminated, carefully remove clothing and launder. Wash your hands after handling contaminated clothing. Place all used disposable facemasks, gloves, and other waste in a lined container before disposing them with other household waste. Remove gloves and wash your hands immediately after handling these items.  Do not share dishes, glasses, or other household items with the patient Avoid sharing household items. You should not share dishes, drinking glasses, cups, eating utensils, towels, bedding, or other items with a patient who is confirmed to have, or being evaluated for, COVID-19 infection. After the person uses these items, you should wash them thoroughly with soap and water.  Wash laundry thoroughly Immediately remove and wash clothes or bedding that have blood, body fluids, and/or secretions or excretions, such as sweat, saliva, sputum, nasal mucus, vomit, urine, or feces, on them. Wear gloves when handling laundry from the patient. Read and follow directions on labels of laundry or clothing items and detergent. In general, wash and dry with the warmest temperatures recommended on the label.  Clean all areas the individual has used often Clean all touchable surfaces, such as counters, tabletops, doorknobs, bathroom fixtures, toilets, phones, keyboards, tablets, and bedside tables, every day. Also, clean any surfaces that may have blood, body fluids, and/or secretions or excretions on them. Wear gloves when cleaning surfaces  the patient has come in contact with. Use a diluted bleach solution (e.g., dilute bleach with 1 part bleach and 10 parts water) or a household disinfectant with a label that says EPA-registered for coronaviruses. To make a bleach solution at home, add 1 tablespoon of bleach to 1 quart (4 cups) of water. For a larger supply, add  cup of bleach to 1 gallon (16 cups) of water. Read labels of cleaning products and follow recommendations provided on product labels. Labels  contain instructions for safe and effective use of the cleaning product including precautions you should take when applying the product, such as wearing gloves or eye protection and making sure you have good ventilation during use of the product. Remove gloves and wash hands immediately after cleaning.  Monitor yourself for signs and symptoms of illness Caregivers and household members are considered close contacts, should monitor their health, and will be asked to limit movement outside of the home to the extent possible. Follow the monitoring steps for close contacts listed on the symptom monitoring form.   ? If you have additional questions, contact your local health department or call the epidemiologist on call at [phone] (available 24/7). ? This guidance is subject to change. For the most up-to-date guidance from [facility], please refer to their website: [email or web address]

## 2019-06-24 NOTE — ED Triage Notes (Signed)
Pt c/o CP x 4 days-states she was seen by PCP with plans to have labs and CT scan to r/o PE-labs were drawn but was not able to have CT due to insurance issues-pt was advised to come to ED-NAD-steady gait

## 2019-06-24 NOTE — Patient Instructions (Addendum)
Please complete lab work prior to leaving. Go to the ER if you develop worsening chest pain or shortness of breath. Go to the ED check in for 7:30 PM CT scan.

## 2019-06-24 NOTE — Progress Notes (Signed)
Subjective:    Patient ID: Michele [name], female    DOB: 1970/09/13, 47 y.o.   MRN: [id number]  HPI  Patient is a 48 yr old female who presents today with chief complaint of chest pain which has been on/off x 2 weeks. She reports chest wall soreness. Reports that she gets some palpitations about once every 3-4 days. She reports that she does have some SOB at times.  Reports that she takes xanax but that does not help symptoms.  She reports that this past weekend she went 2:30 minutes. The weekend before that she drove 2:40.  Reports some bilateral calf pain.    Reports that she urinated in her sleep the other night.   She is not wearing cpap- unit is old.    Review of Systems Past Medical History:  Diagnosis Date  . Anemia    iron deficient, microcytic, hypochromic  . Anxiety   . Chest pain    atypical  . Depression   . Diabetes mellitus without complication ([place])   . Fatigue   . Fatty liver 08/15/2013  . Fibroids    uterine  . Hypertension   . Migraines   . 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 Posey Pronto at [facility]- S/p FNA March/april of two dominant nodules- benign.  Following for annual thyroid US with Dr. Posey Pronto.    . Palpitations    recurrent  . 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 Tachycardia    unspecified     Social History   Socioeconomic History  . Marital status: Married    Spouse name: [name]  . Number of children: 2  . Years of education: Not on file  . Highest education level: Not on file  Occupational History  . Occupation: CLAIMS AGENT    Employer: [employer]  Social Needs  . Financial resource strain: Not on file  . Food insecurity    Worry: Not on file    Inability: Not on file  . Transportation needs    Medical: Not on file    Non-medical: Not on file  Tobacco Use  . Smoking status: Never Smoker  . Smokeless tobacco: Never Used  . Tobacco comment: never used tobacco  Substance and Sexual Activity  .  Alcohol use: Yes    Comment: Once a month  . Drug use: No  . Sexual activity: Yes    Birth control/protection: None, Surgical  Lifestyle  . Physical activity    Days per week: Not on file    Minutes per session: Not on file  . Stress: Not on file  Relationships  . Social Herbalist on phone: Not on file    Gets together: Not on file    Attends religious service: Not on file    Active member of club or organization: Not on file    Attends meetings of clubs or organizations: Not on file    Relationship status: Not on file  . Intimate partner violence    Fear of current or ex partner: Not on file    Emotionally abused: Not on file    Physically abused: Not on file    Forced sexual activity: Not on file  Other Topics Concern  . 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 APPENDECTOMY  03/2009  . BIOPSY THYROID  November 28, 2009   Dr. Posey Pronto- endo  . DILATION AND CURETTAGE OF UTERUS  05/21/2011   Procedure: DILATATION AND CURETTAGE (D&C);  Surgeon: Elveria Royals;  Location: [facility];  Service: Gynecology;  Laterality: N/A;  dilitation and currettage/endometrial currettings  . TUBAL LIGATION  1997  . TUBOPLASTY / TUBOTUBAL ANASTOMOSIS  2005    Family History  Problem Relation Age of Onset  . Hypertension Mother   . Cancer Mother 30       breast  . Cancer Father        colon  . Stroke Brother        handicapped due to complications from spinal meningitis  . Seizures Brother   . Asthma Son   . Arthritis Maternal Grandmother   . Hypertension Maternal Grandmother   . Stroke Maternal Grandfather     Allergies  Allergen Reactions  . Amlodipine Shortness Of Breath, Anxiety, Palpitations and Other (See Comments)  . Bee Venom Anaphylaxis  . Bupropion  Shortness Of Breath, Anxiety, Palpitations and Other (See Comments)  . Clonidine Derivatives     dizziness  . Hydralazine     dizziness  . Imitrex [Sumatriptan] Nausea Only  . Oxycodone Nausea Only    Current Outpatient Medications on File Prior to Visit  Medication Sig Dispense Refill  . ALPRAZolam (XANAX) 1 MG tablet Take 1 mg by mouth as needed. For anxiety    . EPIPEN 2-PAK 0.3 MG/0.3ML SOAJ injection daily as needed (FOR ALLERGY).     . hydrochlorothiazide (HYDRODIURIL) 25 MG tablet Take 1 tablet (25 mg total) by mouth daily. 90 tablet 1  . ibuprofen (ADVIL,MOTRIN) 800 MG tablet Take 800 mg by mouth 3 (three) times daily.  0  . losartan (COZAAR) 100 MG tablet Take 1 tablet (100 mg total) by mouth daily. 90 tablet 1  . [DISCONTINUED] buPROPion (WELLBUTRIN XL) 150 MG 24 hr tablet Take 2 tablets (300 mg total) by mouth daily. 60 tablet 1   No current facility-administered medications on file prior to visit.     BP 140/85 (BP Location: Left Arm, Patient Position: Sitting, Cuff Size: Small)   Pulse 82   Temp (!) 97.4 F (36.3 C) (Temporal)   Resp 16   Ht 5\' 4"  (1.626 m)   Wt 195 lb (88.5 kg)   LMP 06/11/2013   SpO2 98%   BMI 33.47 kg/m       Objective:   Physical Exam Constitutional:      Appearance: She is well-developed.  Neck:     Musculoskeletal: Neck supple.     Thyroid: No thyromegaly.  Cardiovascular:     Rate and Rhythm: Normal rate and regular rhythm.     Heart sounds: Normal heart sounds. No murmur.  Pulmonary:     Effort: Pulmonary effort is normal. No respiratory distress.     Breath sounds: Normal breath sounds. No wheezing.  Musculoskeletal:     Right lower leg: No edema.     Left lower leg: No edema.  Skin:    General: Skin is warm and dry.  Neurological:     Mental Status: She is alert and oriented to person, place, and time.  Psychiatric:        Behavior: Behavior normal.        Thought Content: Thought content normal.        Judgment:  Judgment normal.  Assessment & Plan:  Atypical chest pain- EKG tracing is personally reviewed.  EKG notes sinus tachycardia-  No acute changes. I do think she needs evaluation for PE.  I ordered a stat CT scan and stat labs, however our coordinator was unable to get in touch with [employer] before 5PM due to back up on their end so we could not pre-cert.  Patient was advised to go to the ED for further evaluation. Report was given to Dr. Tyrone Nine, ED physician at the [facility].

## 2019-06-24 NOTE — ED Provider Notes (Signed)
[facility] EMERGENCY DEPARTMENT Provider Note   CSN: [id number] Arrival date & time: 06/24/19  1809     History   Chief Complaint Chief Complaint  Patient presents with  . Chest Pain    HPI Michele [name] is a 48 y.o. female.     48 yo F with a chief complaints of chest pain fatigue and a feeling like she may pass out.  Going on for the past few days.  Patient has had a mild cough.  Some mild abdominal discomfort.  No vomiting or diarrhea.  Patient was seen by her family doctor and was thought to have possible pulmonary embolism.  There is an attempt to try and schedule an outpatient CAT scan but were unable to and so she was sent here for PE study.  I discussed the history with the patient's family doctor.  Accepted for eval.   The history is provided by the patient.  Chest Pain Pain location:  L chest Pain quality: aching   Pain radiates to:  Neck Pain severity:  Moderate Onset quality:  Gradual Duration:  2 days Timing:  Constant Progression:  Worsening Chronicity:  New Relieved by:  Nothing Worsened by:  Nothing Associated symptoms: fatigue and shortness of breath   Associated symptoms: no dizziness, no fever, no headache, no nausea, no palpitations and no vomiting     Past Medical History:  Diagnosis Date  . Anemia    iron deficient, microcytic, hypochromic  . Anxiety   . Chest pain    atypical  . Depression   . Diabetes mellitus without complication ([place])   . Fatigue   . Fatty liver 08/15/2013  . Fibroids    uterine  . Hypertension   . Migraines   . 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 Posey Pronto at [facility]- S/p FNA March/april of two dominant nodules- benign.  Following for annual thyroid US with Dr. Posey Pronto.    . Palpitations    recurrent  . 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 Tachycardia    unspecified    Patient Active Problem List   Diagnosis Date Noted  . Flu-like symptoms 10/29/2018  . Vitamin D  deficiency 01/04/2015  . Fibromyalgia 11/04/2014  . Depression 11/04/2014  . Migraine 10/01/2014  . Fatigue 03/02/2014  . Preventative health care 10/09/2013  . Fatty liver 08/15/2013  . Diabetes type 2, controlled ([place]) 07/11/2012  . Anxiety and depression 11/13/2011  . OSA (obstructive sleep apnea) 10/31/2011  . 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 IRRITABLE BOWEL SYNDROME 08/01/2010  . FIBROIDS, UTERUS 05/24/2010  . Other iron deficiency anemia 05/24/2010  . Essential hypertension 12/22/2009    Past Surgical History:  Procedure Laterality Date  . APPENDECTOMY  03/2009  . BIOPSY THYROID  November 28, 2009   Dr. Posey Pronto- endo  . DILATION AND CURETTAGE OF UTERUS  05/21/2011   Procedure: DILATATION AND CURETTAGE (D&C);  Surgeon: Elveria Royals;  Location: [facility];  Service: Gynecology;  Laterality: N/A;  dilitation and currettage/endometrial currettings  . TUBAL LIGATION  1997  . TUBOPLASTY / TUBOTUBAL ANASTOMOSIS  2005     OB History    Gravida  4   Para  2   Term  1   Preterm  1   AB  2   Living  2     SAB      TAB  1   Ectopic  1   Multiple      Live  Births               Home Medications    Prior to Admission medications   Medication Sig Start Date End Date Taking? Authorizing Provider  ALPRAZolam Duanne Moron) 1 MG tablet Take 1 mg by mouth as needed. For anxiety    [provider]  EPIPEN 2-PAK 0.3 MG/0.3ML SOAJ injection daily as needed (FOR ALLERGY).  05/14/13   [provider]  hydrochlorothiazide (HYDRODIURIL) 25 MG tablet Take 1 tablet (25 mg total) by mouth daily. 03/12/19   Debbrah Alar, NP  ibuprofen (ADVIL,MOTRIN) 800 MG tablet Take 800 mg by mouth 3 (three) times daily. 05/20/15   [provider]  losartan (COZAAR) 100 MG tablet Take 1 tablet (100 mg total) by mouth daily. 03/12/19   Debbrah Alar, NP  buPROPion (WELLBUTRIN XL) 150 MG 24 hr tablet Take 2 tablets (300 mg total) by mouth daily. 10/13/11 11/28/11   Debbrah Alar, NP    Family History Family History  Problem Relation Age of Onset  . Hypertension Mother   . Cancer Mother 60       breast  . Cancer Father        colon  . Stroke Brother        handicapped due to complications from spinal meningitis  . Seizures Brother   . Asthma Son   . Arthritis Maternal Grandmother   . Hypertension Maternal Grandmother   . Stroke Maternal Grandfather     Social History Social History   Tobacco Use  . Smoking status: Never Smoker  . Smokeless tobacco: Never Used  . Tobacco comment: never used tobacco  Substance Use Topics  . Alcohol use: Yes    Comment: Once a month  . Drug use: No     Allergies   Amlodipine, Bee venom, Bupropion, Clonidine derivatives, Hydralazine, Imitrex [sumatriptan], and Oxycodone   Review of Systems Review of Systems  Constitutional: Positive for fatigue. Negative for chills and fever.  HENT: Negative for congestion and rhinorrhea.   Eyes: Negative for redness and visual disturbance.  Respiratory: Positive for shortness of breath. Negative for wheezing.   Cardiovascular: Positive for chest pain. Negative for palpitations.  Gastrointestinal: Negative for nausea and vomiting.  Genitourinary: Negative for dysuria and urgency.  Musculoskeletal: Negative for arthralgias and myalgias.  Skin: Negative for pallor and wound.  Neurological: Negative for dizziness and headaches.     Physical Exam Updated Vital Signs BP (!) 123/91 (BP Location: Right Arm)   Pulse 99   Temp 98.4 F (36.9 C) (Oral)   Resp 18   LMP 06/11/2013   SpO2 99%   Physical Exam Vitals signs and nursing note reviewed.  Constitutional:      General: She is not in acute distress.    Appearance: She is well-developed. She is not diaphoretic.  HENT:     Head: Normocephalic and atraumatic.  Eyes:     Pupils: Pupils are equal, round, and reactive to light.  Neck:     Musculoskeletal: Normal range of motion and neck supple.   Cardiovascular:     Rate and Rhythm: Normal rate and regular rhythm.     Heart sounds: No murmur. No friction rub. No gallop.   Pulmonary:     Effort: Pulmonary effort is normal.     Breath sounds: No wheezing or rales.  Abdominal:     General: There is no distension.     Palpations: Abdomen is soft.     Tenderness: There is no  abdominal tenderness.  Musculoskeletal:        General: No tenderness.  Skin:    General: Skin is warm and dry.  Neurological:     Mental Status: She is alert and oriented to person, place, and time.  Psychiatric:        Behavior: Behavior normal.      ED Treatments / Results  Labs (all labs ordered are listed, but only abnormal results are displayed) Labs Reviewed  BASIC METABOLIC PANEL - Abnormal; Notable for the following components:      Result Value   Glucose, Bld 103 (*)    All other components within normal limits  NOVEL CORONAVIRUS, NAA (HOSP ORDER, SEND-OUT TO REF LAB; TAT 18-24 HRS)  BRAIN NATRIURETIC PEPTIDE    EKG EKG Interpretation  Date/Time:  Tuesday June 24 2019 18:19:27 EDT Ventricular Rate:  100 PR Interval:  134 QRS Duration: 80 QT Interval:  332 QTC Calculation: 428 R Axis:   68 Text Interpretation:  Normal sinus rhythm Nonspecific ST abnormality Abnormal ECG Since last tracing rate slower Confirmed by Deno Etienne [phone]) on 06/24/2019 7:49:34 PM   Radiology Ct Angio Chest Pe W And/or Wo Contrast  Result Date: 06/24/2019 CLINICAL DATA:  48 year old female with history of chest pain for the past 4 days. Evaluate for pulmonary embolism. EXAM: CT ANGIOGRAPHY CHEST WITH CONTRAST TECHNIQUE: Multidetector CT imaging of the chest was performed using the standard protocol during bolus administration of intravenous contrast. Multiplanar CT image reconstructions and MIPs were obtained to evaluate the vascular anatomy. CONTRAST:  7mL OMNIPAQUE IOHEXOL 350 MG/ML SOLN COMPARISON:  No priors. FINDINGS: Cardiovascular: No filling  defects within the pulmonary arterial tree to suggest underlying pulmonary embolism. Heart size is normal. There is no significant pericardial fluid, thickening or pericardial calcification. Aortic atherosclerosis. No definite coronary artery calcifications. Mediastinum/Nodes: No pathologically enlarged mediastinal or hilar lymph nodes. Esophagus is unremarkable in appearance. No axillary lymphadenopathy. Lungs/Pleura: No suspicious appearing pulmonary nodules or masses are noted. No acute consolidative airspace disease. No pleural effusions. Upper Abdomen: Unremarkable. Musculoskeletal: There are no aggressive appearing lytic or blastic lesions noted in the visualized portions of the skeleton. Review of the MIP images confirms the above findings. IMPRESSION: 1. No evidence of pulmonary embolism. 2. No acute findings are noted in the thorax to account for the patient's symptoms. 3. Aortic atherosclerosis. Aortic Atherosclerosis (ICD10-I70.0). Electronically Signed   By: Vinnie Langton M.D.   On: 06/24/2019 20:01    Procedures Procedures (including critical care time)  Medications Ordered in ED Medications  iohexol (OMNIPAQUE) 350 MG/ML injection 75 mL (75 mLs Intravenous Contrast Given 06/24/19 1936)     Initial Impression / Assessment and Plan / ED Course  I have reviewed the triage vital signs and the nursing notes.  Pertinent labs & imaging results that were available during my care of the patient were reviewed by me and considered in my medical decision making (see chart for details).        48 yo F sent here for rule out of a pulmonary embolism.  CT scan here is negative for PE.  Patient's troponin that was done in the office was completely negative.  BMP is negative.  Metabolic panel without electrolyte derangement.  CBC from the clinic was reviewed by me without a significant anemia or leukocytosis.  I discussed with the patient that during this pandemic there is a concern for  coronavirus.  Patient is having some vague shortness of breath on exertion with minimal cough.  Patient initially thought she had had no contacts and then later mentioned that she has been taking care of a child that had a close contact the tested positive and the child has been quarantined at her house.  We will send an outpatient cover test.  Have the patient try to quarantine his best she can for the results.  PCP follow-up.  JEANENE MORICI was evaluated in Emergency Department on 06/24/2019 for the symptoms described in the history of present illness. He/she was evaluated in the context of the global COVID-19 pandemic, which necessitated consideration that the patient might be at risk for infection with the SARS-CoV-2 virus that causes COVID-19. Institutional protocols and algorithms that pertain to the evaluation of patients at risk for COVID-19 are in a state of rapid change based on information released by regulatory bodies including the CDC and federal and state organizations. These policies and algorithms were followed during the patient's care in the ED.  8:35 PM:  I have discussed the diagnosis/risks/treatment options with the patient and believe the pt to be eligible for discharge home to follow-up with PCP. We also discussed returning to the ED immediately if new or worsening sx occur. We discussed the sx which are most concerning (e.g., sudden worsening pain, fever, inability to tolerate by mouth) that necessitate immediate return. Medications administered to the patient during their visit and any new prescriptions provided to the patient are listed below.  Medications given during this visit Medications  iohexol (OMNIPAQUE) 350 MG/ML injection 75 mL (75 mLs Intravenous Contrast Given 06/24/19 1936)     The patient appears reasonably screen and/or stabilized for discharge and I doubt any other medical condition or other [facility] requiring further screening, evaluation, or treatment in  the ED at this time prior to discharge.    Final Clinical Impressions(s) / ED Diagnoses   Final diagnoses:  Nonspecific chest pain    ED Discharge Orders    None       Deno Etienne, DO 06/24/19 2035

## 2019-06-25 LAB — TSH: TSH: 1.72 mIU/L

## 2019-06-25 LAB — CBC WITH DIFFERENTIAL/PLATELET
Absolute Monocytes: 506 cells/uL (ref 200–950)
Basophils Absolute: 8 cells/uL (ref 0–200)
Basophils Relative: 0.1 %
Eosinophils Absolute: 47 cells/uL (ref 15–500)
Eosinophils Relative: 0.6 %
HCT: 40.3 % (ref 35.0–45.0)
Hemoglobin: 13.6 g/dL (ref 11.7–15.5)
Lymphs Abs: 2425 cells/uL (ref 850–3900)
MCH: 28.3 pg (ref 27.0–33.0)
MCHC: 33.7 g/dL (ref 32.0–36.0)
MCV: 84 fL (ref 80.0–100.0)
MPV: 12.7 fL — ABNORMAL HIGH (ref 7.5–12.5)
Monocytes Relative: 6.4 %
Neutro Abs: 4914 cells/uL (ref 1500–7800)
Neutrophils Relative %: 62.2 %
Platelets: 234 10*3/uL (ref 140–400)
RBC: 4.8 10*6/uL (ref 3.80–5.10)
RDW: 13.5 % (ref 11.0–15.0)
Total Lymphocyte: 30.7 %
WBC: 7.9 10*3/uL (ref 3.8–10.8)

## 2019-06-25 LAB — COMPREHENSIVE METABOLIC PANEL
AG Ratio: 1.6 (calc) (ref 1.0–2.5)
ALT: 18 U/L (ref 6–29)
AST: 20 U/L (ref 10–35)
Albumin: 4.4 g/dL (ref 3.6–5.1)
Alkaline phosphatase (APISO): 66 U/L (ref 31–125)
BUN: 15 mg/dL (ref 7–25)
CO2: 28 mmol/L (ref 20–32)
Calcium: 9.9 mg/dL (ref 8.6–10.2)
Chloride: 105 mmol/L (ref 98–110)
Creat: 0.86 mg/dL (ref 0.50–1.10)
Globulin: 2.8 g/dL (calc) (ref 1.9–3.7)
Glucose, Bld: 109 mg/dL — ABNORMAL HIGH (ref 65–99)
Potassium: 4.5 mmol/L (ref 3.5–5.3)
Sodium: 140 mmol/L (ref 135–146)
Total Bilirubin: 0.6 mg/dL (ref 0.2–1.2)
Total Protein: 7.2 g/dL (ref 6.1–8.1)

## 2019-06-25 LAB — TROPONIN I: Troponin I: <0.01 ng/mL (ref <=0.0)

## 2019-06-26 LAB — NOVEL CORONAVIRUS, NAA (HOSP ORDER, SEND-OUT TO REF LAB; TAT 18-24 HRS): SARS-CoV-2, NAA: NOT DETECTED

## 2019-07-03 ENCOUNTER — Encounter: Payer: Self-pay | Admitting: Family

## 2019-07-04 NOTE — Telephone Encounter (Signed)
Called patient back but no answer and mail box was full

## 2019-07-24 ENCOUNTER — Encounter: Payer: Self-pay | Admitting: Family

## 2019-09-08 DIAGNOSIS — Z20828 Contact with and (suspected) exposure to other viral communicable diseases: Secondary | ICD-10-CM | POA: Diagnosis not present

## 2019-09-11 DIAGNOSIS — Z20828 Contact with and (suspected) exposure to other viral communicable diseases: Secondary | ICD-10-CM | POA: Diagnosis not present

## 2019-09-15 ENCOUNTER — Other Ambulatory Visit: Payer: Self-pay | Admitting: Family

## 2019-09-15 ENCOUNTER — Encounter: Payer: Self-pay | Admitting: Family

## 2019-09-15 DIAGNOSIS — R0789 Other chest pain: Secondary | ICD-10-CM

## 2019-10-13 ENCOUNTER — Other Ambulatory Visit: Payer: Self-pay | Admitting: Family

## 2019-10-15 DIAGNOSIS — F331 Major depressive disorder, recurrent, moderate: Secondary | ICD-10-CM | POA: Diagnosis not present

## 2019-10-15 DIAGNOSIS — F410 Panic disorder [episodic paroxysmal anxiety] without agoraphobia: Secondary | ICD-10-CM | POA: Diagnosis not present

## 2019-10-21 ENCOUNTER — Ambulatory Visit: Payer: BC Managed Care – PPO | Admitting: Cardiovascular Disease

## 2019-11-11 ENCOUNTER — Encounter: Payer: Self-pay | Admitting: Cardiovascular Disease

## 2019-11-11 ENCOUNTER — Telehealth: Payer: Self-pay | Admitting: Radiology

## 2019-11-11 ENCOUNTER — Other Ambulatory Visit: Payer: Self-pay

## 2019-11-11 ENCOUNTER — Encounter: Payer: Self-pay | Admitting: *Deleted

## 2019-11-11 ENCOUNTER — Ambulatory Visit: Payer: BC Managed Care – PPO | Admitting: Cardiovascular Disease

## 2019-11-11 VITALS — BP 130/82 | HR 97 | Ht 64.0 in | Wt 203.0 lb

## 2019-11-11 DIAGNOSIS — R072 Precordial pain: Secondary | ICD-10-CM | POA: Diagnosis not present

## 2019-11-11 DIAGNOSIS — Z01812 Encounter for preprocedural laboratory examination: Secondary | ICD-10-CM | POA: Diagnosis not present

## 2019-11-11 DIAGNOSIS — G4733 Obstructive sleep apnea (adult) (pediatric): Secondary | ICD-10-CM

## 2019-11-11 DIAGNOSIS — R002 Palpitations: Secondary | ICD-10-CM

## 2019-11-11 DIAGNOSIS — I10 Essential (primary) hypertension: Secondary | ICD-10-CM | POA: Diagnosis not present

## 2019-11-11 MED ORDER — IVABRADINE HCL 5 MG PO TABS: ORAL_TABLET | ORAL | 0 refills | Status: DC

## 2019-11-11 MED ORDER — METOPROLOL TARTRATE 100 MG PO TABS: ORAL_TABLET | ORAL | 0 refills | Status: DC

## 2019-11-11 NOTE — Telephone Encounter (Signed)
Enrolled patient for a 7 day [employer] Event monitor to be mailed to patients home  °

## 2019-11-11 NOTE — Progress Notes (Signed)
Cardiology Office Note   Date:  11/11/2019   ID:  DEBI TACK, DOB [birth year], MRN [id number]  PCP:  Debbrah Alar, NP  Cardiologist:   Skeet Latch, MD   No chief complaint on file.    History of Present Illness: LOLENE SHAMS is a 49 y.o. female with hypertension, palpitations, chronic chest pain, OSA, who is being seen today for the evaluation of chest pain with hypokalemia at the request of Debbrah Alar, NP.  [name] reports having chest pain for years.  She feels like her chest gets tight and her heart starts racing.  It occurs when sitting at her desk.  She can feel her heart pounding in the ears.  Sometimes it even wakes her up from sleep.  She also notes that when she tries to exert herself even minimally her heart she has closed her exercise 1 time a couple watch her every day.  However despite this exercise she is unable to see improvements in her exertional capacity.  She has a [employer] but cannot participate fully because her heart starts racing too fast.  She denies any lower extremity edema, orthopnea, or PND.  She does have heartburn but feels that it is well-controlled at this time.  She previously had a sleep study and was told she had mild sleep apnea and did not require a CPAP machine.  She notes that she has gained some weight since this time.  Her blood pressure at home is typically well-controlled.  It is in the 120s over 80s to 90s.  She gets anxious when she comes to the doctor.  In the past she was told that her chest pain was related to anxiety.  However she really does not think it is anxiety.  She takes Xanax, which does not help.  Also she has heart racing and chest tightness and exertional situations at which time she does not feel anxious.  For the last few months she has also struggled with soreness and a fatigued feeling in her legs.  It feels like she will work out even when she has not worked out.  She struggles  with right groin pain as well.  She had heart catheterization in 2013 that showed normal coronaries. She last saw Dr. Burt Knack in 2014 and complained of frequent palpitations. Echo 08/2013 revealed LVEF 50 to 55% with mild mitral regurgitation. She was offered a trial of metoprolol but didn't use it.  Past Medical History:  Diagnosis Date  . Anemia    iron deficient, microcytic, hypochromic  . Anxiety   . Chest pain    atypical  . Depression   . Diabetes mellitus without complication ([place])   . Fatigue   . Fatty liver 08/15/2013  . Fibroids    uterine  . Hypertension   . Migraines   . 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 Posey Pronto at [facility]- S/p FNA March/april of two dominant nodules- benign.  Following for annual thyroid US with Dr. Posey Pronto.    . Palpitations    recurrent  . 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 Tachycardia    unspecified    Past Surgical History:  Procedure Laterality Date  . APPENDECTOMY  03/2009  . BIOPSY THYROID  November 28, 2009   Dr. Posey Pronto- endo  . DILATION AND CURETTAGE OF UTERUS  05/21/2011   Procedure: DILATATION AND CURETTAGE (D&C);  Surgeon: Elveria Royals;  Location: [facility];  Service: Gynecology;  Laterality: N/A;  dilitation and currettage/endometrial currettings  . TUBAL LIGATION  1997  . TUBOPLASTY / TUBOTUBAL ANASTOMOSIS  2005     Current Outpatient Medications  Medication Sig Dispense Refill  . ALPRAZolam (XANAX) 1 MG tablet Take 1 mg by mouth as needed. For anxiety    . EPIPEN 2-PAK 0.3 MG/0.3ML SOAJ injection daily as needed (FOR ALLERGY).     . hydrochlorothiazide (HYDRODIURIL) 25 MG tablet Take 1 tablet by mouth once daily 90 tablet 0  . ibuprofen (ADVIL,MOTRIN) 800 MG tablet Take 800 mg by mouth 3 (three) times daily.  0  . losartan (COZAAR) 100 MG tablet Take 1 tablet by mouth once daily 90 tablet 0  . ivabradine (CORLANOR) 5 MG TABS tablet TAKE 1 TABLET 2 HOURS PRIOR TO CT 1 tablet 0  . metoprolol tartrate  (LOPRESSOR) 100 MG tablet TAKE 1 TABLET 2 HOURS PRIOR TO CT 1 tablet 0   No current facility-administered medications for this visit.    Allergies:   Amlodipine, Bee venom, Bupropion, Clonidine derivatives, Hydralazine, Imitrex [sumatriptan], and Oxycodone    Social History:  The patient  reports that she has never smoked. She has never used smokeless tobacco. She reports current alcohol use. She reports that she does not use drugs.   Family History:  The patient's family history includes Arthritis in her maternal grandmother; Asthma in her son; Cancer in her father; Cancer (age of onset: 43) in her mother; Heart attack in her maternal grandmother; Hypertension in her maternal grandmother, maternal uncle, and mother; Seizures in her brother; Stroke in her brother and maternal grandfather.    ROS:  Please see the history of present illness.   Otherwise, review of systems are positive for none.   All other systems are reviewed and negative.    PHYSICAL EXAM: VS:  BP 130/82   Pulse 97   Ht 5\' 4"  (1.626 m)   Wt 203 lb (92.1 kg)   LMP 06/11/2013   BMI 34.84 kg/m  , BMI Body mass index is 34.84 kg/m. GENERAL:  Well appearing HEENT:  Pupils equal round and reactive, fundi not visualized, oral mucosa unremarkable NECK:  No jugular venous distention, waveform within normal limits, carotid upstroke brisk and symmetric, no bruits LUNGS:  Clear to auscultation bilaterally HEART:  RRR.  PMI not displaced or sustained,S1 and S2 within normal limits, no S3, no S4, no clicks, no rubs, no murmurs ABD:  Flat, positive bowel sounds normal in frequency in pitch, no bruits, no rebound, no guarding, no midline pulsatile mass, no hepatomegaly, no splenomegaly EXT:  2 plus pulses throughout, no edema, no cyanosis no clubbing SKIN:  No rashes no nodules NEURO:  Cranial nerves II through XII grossly intact, motor grossly intact throughout PSYCH:  Cognitively intact, oriented to person place and  time    EKG:  EKG is ordered today. The ekg ordered today demonstrates sinus rhythm.  Rate 97 bpm.  Echo 08/2013: Study Conclusions   - Left ventricle: The cavity size was normal. Wall thickness  was normal. Systolic function was normal. The estimated  ejection fraction was in the range of 50% to 55%. Wall  motion was normal; there were no regional wall motion  abnormalities. Left ventricular diastolic function  parameters were normal.  - Mitral valve: Mild regurgitation.  - Atrial septum: No defect or patent foramen ovale was  identified.   Renal artery Doppler 12/25/11: normal renal arteries  Recent Labs: 06/24/2019: ALT 18; B Natriuretic Peptide 9.5; BUN 15; Creatinine, Ser 0.80;  Hemoglobin 13.6; Platelets 234; Potassium 3.8; Sodium 140; TSH 1.72    Lipid Panel    Component Value Date/Time   CHOL 195 09/13/2018 1017   TRIG 71.0 09/13/2018 1017   HDL 42.20 09/13/2018 1017   CHOLHDL 5 09/13/2018 1017   VLDL 14.2 09/13/2018 1017   LDLCALC 139 (H) 09/13/2018 1017      Wt Readings from Last 3 Encounters:  11/11/19 203 lb (92.1 kg)  06/24/19 195 lb (88.5 kg)  10/29/18 197 lb 9.6 oz (89.6 kg)      ASSESSMENT AND PLAN:  # Tachycardia: Ms. Sandria Senter seems to have an exaggerated heart rate response to exercise.  She also has tachycardia at rest.  She does not think that this is entirely related to anxiety.  We will get a 7-day ambulatory monitor to ensure that these are sinus tachycardia episodes and not another arrhythmia.  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  I am suspicious that OSA may be contributing.  It has been a long time since her last sleep study.  She has episodes of tachycardia or profound bradycardia overnight we will consider repeating her sleep study.  She already has the appropriate lab testing and it was unremarkable.  # Chest pain:  [name] has chest pain that  does not seem to be cardiac.  She had a CT PE protocol that was negative for PE and did not demonstrate any evidence of coronary plaque.  However, given that she has had the chest pain for so long and that it can occur with exertional situations, we will get a coronary CT-A.  This will show evidence of any congenital abnormalities or myocardial bridging.  She is tachycardic at baseline we will give ivabradine 5 mg in addition to metoprolol for premedication.  # Essential hypertension:   Blood pressure was poorly controlled here but she reports that it is well controlled at home.  She is anxious about this appointment.  Consider adding a beta-blocker as above.  Continue hydrochlorothiazide and losartan for now.   Current medicines are reviewed at length with the patient today.  The patient does not have concerns regarding medicines.  The following changes have been made:  no change  Labs/ tests ordered today include:   Orders Placed This Encounter  Procedures  . CT CORONARY MORPH W/CTA COR W/SCORE W/CA W/CM &/OR WO/CM  . CT CORONARY FRACTIONAL FLOW RESERVE DATA PREP  . CT CORONARY FRACTIONAL FLOW RESERVE FLUID ANALYSIS  . Basic metabolic panel  . CARDIAC EVENT MONITOR  . EKG 12-Lead     Disposition:   FU with Kholton Coate C. Oval Linsey, MD, [facility]      Signed, [place] Oval Linsey, MD, [facility]  11/11/2019 11:58 AM    [place]

## 2019-11-11 NOTE — Patient Instructions (Addendum)
Medication Instructions:  TAKE METOPROLOL 100 MG 2 HOURS PRIOR TO CT   CORLANOR 5 MG 2 HOURS PRIOR TO CT   *If you need a refill on your cardiac medications before your next appointment, please call your pharmacy*  Lab Work: BMET 1 WEEK PRIOR TO CARDIAC CT If you have labs (blood work) drawn today and your tests are completely normal, you will receive your results only by: Marland Kitchen MyChart Message (if you have MyChart) OR . A paper copy in the mail If you have any lab test that is abnormal or we need to change your treatment, we will call you to review the results.  Testing/Procedures: Your physician has recommended that you wear an event monitor. Event monitors are medical devices that record the heart's electrical activity. Doctors most often [name] these monitors to diagnose arrhythmias. Arrhythmias are problems with the speed or rhythm of the heartbeat. The monitor is a small, portable device. You can wear one while you do your normal daily activities. This is usually used to diagnose what is causing palpitations/syncope (passing out).  Your physician has requested that you have cardiac CT. Cardiac computed tomography (CT) is a painless test that uses an x-ray machine to take clear, detailed pictures of your heart. For further information please visit [email or web address]. Please follow instruction sheet as given.  Follow-Up: At [facility], you and your health needs are our priority.  As part of our continuing mission to provide you with exceptional heart care, we have created designated Provider Care Teams.  These Care Teams include your primary Cardiologist (physician) and Advanced Practice Providers (APPs -  Physician Assistants and Nurse Practitioners) who all work together to provide you with the care you need, when you need it.  We recommend signing up for the patient portal called "MyChart".  Sign up information is provided on this After Visit Summary.  MyChart is used to connect with  patients for Virtual Visits (Telemedicine).  Patients are able to view lab/test results, encounter notes, upcoming appointments, etc.  Non-urgent messages can be sent to your provider as well.   To learn more about what you can do with MyChart, go to [email or web address].    Your next appointment:   2-3  month(s)  The format for your next appointment:   In Person OR VIRTUAL   Provider:   DR [place]  Other Instructions  Your cardiac CT will be scheduled at one of the below locations:   [facility] [address] [place], [place] [address] [phone]  [facility] [address] [place], [place] [address] [phone]  If scheduled at [facility], please arrive at the [facility] main entrance of [facility] 30 minutes prior to test start time. Proceed to the [facility] Radiology Department (first floor) to check-in and test prep.  If scheduled at [facility], please arrive 15 mins early for check-in and test prep.  Please follow these instructions carefully (unless otherwise directed):  Hold all erectile dysfunction medications at least 3 days (72 hrs) prior to test.  On the Night Before the Test: . Be sure to Drink plenty of water. . Do not consume any caffeinated/decaffeinated beverages or chocolate 12 hours prior to your test. . Do not take any antihistamines 12 hours prior to your test. . If you take Metformin do not take 24 hours prior to test. . If the patient has contrast allergy: ? Patient will  need a prescription for Prednisone and very clear instructions (as follows): 1. Prednisone 50 mg - take 13 hours prior to test 2. Take another Prednisone 50 mg 7 hours prior to test 3. Take another Prednisone 50 mg 1 hour prior to test 4. Take Benadryl 50 mg 1 hour prior to test . Patient must complete all four doses of above prophylactic  medications. . Patient will need a ride after test due to Benadryl.  On the Day of the Test: . Drink plenty of water. Do not drink any water within one hour of the test. . Do not eat any food 4 hours prior to the test. . You may take your regular medications prior to the test.  . Take metoprolol (Lopressor) two hours prior to test. . HOLD Furosemide/Hydrochlorothiazide morning of the test. . FEMALES- please wear underwire-free bra if available       After the Test: . Drink plenty of water. . After receiving IV contrast, you may experience a mild flushed feeling. This is normal. . On occasion, you may experience a mild rash up to 24 hours after the test. This is not dangerous. If this occurs, you can take Benadryl 25 mg and increase your fluid intake. . If you experience trouble breathing, this can be serious. If it is severe call 911 IMMEDIATELY. If it is mild, please call our office. . If you take any of these medications: Glipizide/Metformin, Avandament, Glucavance, please do not take 48 hours after completing test unless otherwise instructed.   Once we have confirmed authorization from your insurance company, we will call you to set up a date and time for your test.   For non-scheduling related questions, please contact the cardiac imaging nurse navigator should you have any questions/concerns: Marchia Bond, RN Navigator Cardiac Imaging [facility] [phone] mobile   Cardiac CT Angiogram A cardiac CT angiogram is a procedure to look at the heart and the area around the heart. It may be done to help find the cause of chest pains or other symptoms of heart disease. During this procedure, a substance called contrast dye is injected into the blood vessels in the area to be checked. A large X-ray machine, called a CT scanner, then takes detailed pictures of the heart and the surrounding area. The procedure is also sometimes called a coronary CT angiogram,  coronary artery scanning, or CTA. A cardiac CT angiogram allows the health care provider to see how well blood is flowing to and from the heart. The health care provider will be able to see if there are any problems, such as:  Blockage or narrowing of the coronary arteries in the heart.  Fluid around the heart.  Signs of weakness or disease in the muscles, valves, and tissues of the heart. Tell a health care provider about:  Any allergies you have. This is especially important if you have had a previous allergic reaction to contrast dye.  All medicines you are taking, including vitamins, herbs, eye drops, creams, and over-the-counter medicines.  Any blood disorders you have.  Any surgeries you have had.  Any medical conditions you have.  Whether you are pregnant or may be pregnant.  Any anxiety disorders, chronic pain, or other conditions you have that may increase your stress or prevent you from lying still. What are the risks? Generally, this is a safe procedure. However, problems may occur, including:  Bleeding.  Infection.  Allergic reactions to medicines or dyes.  Damage to other  structures or organs.  Kidney damage from the contrast dye that is used.  Increased risk of cancer from radiation exposure. This risk is low. Talk with your health care provider about: ? The risks and benefits of testing. ? How you can receive the lowest dose of radiation. What happens before the procedure?  Wear comfortable clothing and remove any jewelry, glasses, dentures, and hearing aids.  Follow instructions from your health care provider about eating and drinking. This may include: ? For 12 hours before the procedure -- avoid caffeine. This includes tea, coffee, soda, energy drinks, and diet pills. Drink plenty of water or other fluids that do not have caffeine in them. Being well hydrated can prevent complications. ? For 4-6 hours before the procedure -- stop eating and drinking. The  contrast dye can cause nausea, but this is less likely if your stomach is empty.  Ask your health care provider about changing or stopping your regular medicines. This is especially important if you are taking diabetes medicines, blood thinners, or medicines to treat problems with erections (erectile dysfunction). What happens during the procedure?   Hair on your chest may need to be removed so that small sticky patches called electrodes can be placed on your chest. These will transmit information that helps to monitor your heart during the procedure.  An IV will be inserted into one of your veins.  You might be given a medicine to control your heart rate during the procedure. This will help to ensure that good images are obtained.  You will be asked to lie on an exam table. This table will slide in and out of the CT machine during the procedure.  Contrast dye will be injected into the IV. You might feel warm, or you may get a metallic taste in your mouth.  You will be given a medicine called nitroglycerin. This will relax or dilate the arteries in your heart.  The table that you are lying on will move into the CT machine tunnel for the scan.  The person running the machine will give you instructions while the scans are being done. You may be asked to: ? Keep your arms above your head. ? Hold your breath. ? Stay very still, even if the table is moving.  When the scanning is complete, you will be moved out of the machine.  The IV will be removed. The procedure may vary among health care providers and hospitals. What can I expect after the procedure? After your procedure, it is common to have:  A metallic taste in your mouth from the contrast dye.  A feeling of warmth.  A headache from the nitroglycerin. Follow these instructions at home:  Take over-the-counter and prescription medicines only as told by your health care provider.  If you are told, drink enough fluid to keep  your urine pale yellow. This will help to flush the contrast dye out of your body.  Most people can return to their normal activities right after the procedure. Ask your health care provider what activities are safe for you.  It is up to you to get the results of your procedure. Ask your health care provider, or the department that is doing the procedure, when your results will be ready.  Keep all follow-up visits as told by your health care provider. This is important. Contact a health care provider if:  You have any symptoms of allergy to the contrast dye. These include: ? Shortness of breath. ? Rash or  hives. ? A racing heartbeat. Summary  A cardiac CT angiogram is a procedure to look at the heart and the area around the heart. It may be done to help find the cause of chest pains or other symptoms of heart disease.  During this procedure, a large X-ray machine, called a CT scanner, takes detailed pictures of the heart and the surrounding area after a contrast dye has been injected into blood vessels in the area.  Ask your health care provider about changing or stopping your regular medicines before the procedure. This is especially important if you are taking diabetes medicines, blood thinners, or medicines to treat erectile dysfunction.  If you are told, drink enough fluid to keep your urine pale yellow. This will help to flush the contrast dye out of your body. This information is not intended to replace advice given to you by your health care provider. Make sure you discuss any questions you have with your health care provider. Document Revised: 04/23/2019 Document Reviewed: 04/23/2019 Elsevier Patient Education  2020 [place] Cardiac Event Monitor Instructions Your physician has requested you wear your cardiac event monitor for ___7__ days, (1-30). [employer] may call or text to confirm a shipping address. The monitor will be sent to a land address via  UPS. [employer] will not ship a monitor to a PO BOX. It typically takes 3-5 days to receive your monitor after it has been enrolled. [employer] will assist with USPS tracking if your package is delayed. The telephone number for [employer] is [phone]. Once you have received your monitor, please review the enclosed instructions. Instruction tutorials can also be viewed under help and settings on the enclosed cell phone. Your monitor has already been registered assigning a specific monitor serial # to you.  Applying the monitor Remove cell phone from case and turn it on. The cell phone works as Dealer and needs to be within [employer] of you at all times. The cell phone will need to be charged on a daily basis. We recommend you plug the cell phone into the enclosed charger at your bedside table every night.  Monitor batteries: You will receive two monitor batteries labelled #1 and #2. These are your recorders. Plug battery #2 onto the second connection on the enclosed charger. Keep one battery on the charger at all times. This will keep the monitor battery deactivated. It will also keep it fully charged for when you need to switch your monitor batteries. A small light will be blinking on the battery emblem when it is charging. The light on the battery emblem will remain on when the battery is fully charged.  Open package of a Monitor strip. Insert battery #1 into black hood on strip and gently squeeze monitor battery onto connection as indicated in instruction booklet. Set aside while preparing skin.  Choose location for your strip, vertical or horizontal, as indicated in the instruction booklet. Shave to remove all hair from location. There cannot be any lotions, oils, powders, or colognes on skin where monitor is to be applied. Wipe skin clean with enclosed Saline wipe. Dry skin completely.  Peel paper labeled #1 off the back of the Monitor strip exposing the adhesive. Place  the monitor on the chest in the vertical or horizontal position shown in the instruction booklet. One arrow on the monitor strip must be pointing upward. Carefully remove paper labeled #2, attaching remainder of strip to your skin. Try not to create any folds or wrinkles in the  strip as you apply it.  Firmly press and release the circle in the center of the monitor battery. You will hear a small beep. This is turning the monitor battery on. The heart emblem on the monitor battery will light up every 5 seconds if the monitor battery in turned on and connected to the patient securely. Do not push and hold the circle down as this turns the monitor battery off. The cell phone will locate the monitor battery. A screen will appear on the cell phone checking the connection of your monitor strip. This may read poor connection initially but change to good connection within the next minute. Once your monitor accepts the connection you will hear a series of 3 beeps followed by a climbing crescendo of beeps. A screen will appear on the cell phone showing the two monitor strip placement options. Touch the picture that demonstrates where you applied the monitor strip.  Your monitor strip and battery are waterproof. You are able to shower, bathe, or swim with the monitor on. They just ask you do not submerge deeper than 3 feet underwater. We recommend removing the monitor if you are swimming in a lake, river, or ocean.  Your monitor battery will need to be switched to a fully charged monitor battery approximately once a week. The cell phone will alert you of an action which needs to be made.  On the cell phone, tap for details to reveal connection status, monitor battery status, and cell phone battery status. The green dots indicates your monitor is in good status. A red dot indicates there is something that needs your attention.  To record a symptom, click the circle on the monitor battery. In 30-60  seconds a list of symptoms will appear on the cell phone. Select your symptom and tap save. Your monitor will record a sustained or significant arrhythmia regardless of you clicking the button. Some patients do not feel the heart rhythm irregularities. [employer] will notify us of any serious or critical events.  Refer to instruction booklet for instructions on switching batteries, changing strips, the Do not disturb or Pause features, or any additional questions.  Call [employer] at [phone], to confirm your monitor is transmitting and record your baseline. They will answer any questions you may have regarding the monitor instructions at that time.  Returning the monitor to [place] all equipment back into blue box. Peel off strip of paper to expose adhesive and close box securely. There is a prepaid UPS shipping label on this box. Drop in a UPS drop box, or at a UPS facility like [employer]. You may also contact [employer] to arrange UPS to pick up monitor package at your home.

## 2019-11-28 ENCOUNTER — Emergency Department (HOSPITAL_BASED_OUTPATIENT_CLINIC_OR_DEPARTMENT_OTHER): Payer: BC Managed Care – PPO

## 2019-11-28 ENCOUNTER — Other Ambulatory Visit: Payer: Self-pay

## 2019-11-28 ENCOUNTER — Encounter (INDEPENDENT_AMBULATORY_CARE_PROVIDER_SITE_OTHER): Payer: BC Managed Care – PPO

## 2019-11-28 ENCOUNTER — Emergency Department (HOSPITAL_BASED_OUTPATIENT_CLINIC_OR_DEPARTMENT_OTHER)
Admission: EM | Admit: 2019-11-28 | Discharge: 2019-11-29 | Disposition: A | Discharge status: Home / Self Care | Payer: BC Managed Care – PPO | Attending: Emergency Medicine | Admitting: Emergency Medicine

## 2019-11-28 ENCOUNTER — Encounter (HOSPITAL_BASED_OUTPATIENT_CLINIC_OR_DEPARTMENT_OTHER): Payer: Self-pay

## 2019-11-28 DIAGNOSIS — R079 Chest pain, unspecified: Secondary | ICD-10-CM | POA: Diagnosis not present

## 2019-11-28 DIAGNOSIS — R072 Precordial pain: Secondary | ICD-10-CM

## 2019-11-28 DIAGNOSIS — E119 Type 2 diabetes mellitus without complications: Secondary | ICD-10-CM | POA: Insufficient documentation

## 2019-11-28 DIAGNOSIS — R002 Palpitations: Secondary | ICD-10-CM

## 2019-11-28 DIAGNOSIS — R000 Tachycardia, unspecified: Secondary | ICD-10-CM

## 2019-11-28 DIAGNOSIS — Z791 Long term (current) use of non-steroidal anti-inflammatories (NSAID): Secondary | ICD-10-CM | POA: Insufficient documentation

## 2019-11-28 DIAGNOSIS — Z79899 Other long term (current) drug therapy: Secondary | ICD-10-CM | POA: Insufficient documentation

## 2019-11-28 DIAGNOSIS — I10 Essential (primary) hypertension: Secondary | ICD-10-CM | POA: Diagnosis not present

## 2019-11-28 LAB — CBC
HCT: 41.4 % (ref 36.0–46.0)
Hemoglobin: 14.2 g/dL (ref 12.0–15.0)
MCH: 29.1 pg (ref 26.0–34.0)
MCHC: 34.3 g/dL (ref 30.0–36.0)
MCV: 84.8 fL (ref 80.0–100.0)
Platelets: 227 10*3/uL (ref 150–400)
RBC: 4.88 MIL/uL (ref 3.87–5.11)
RDW: 13.5 % (ref 11.5–15.5)
WBC: 12.9 10*3/uL — ABNORMAL HIGH (ref 4.0–10.5)
nRBC: 0 % (ref 0.0–0.2)

## 2019-11-28 LAB — TROPONIN I (HIGH SENSITIVITY): Troponin I (High Sensitivity): <2 ng/L (ref <18)

## 2019-11-28 LAB — BASIC METABOLIC PANEL
Anion gap: 12 (ref 5–15)
BUN: 18 mg/dL (ref 6–20)
CO2: 27 mmol/L (ref 22–32)
Calcium: 9.8 mg/dL (ref 8.9–10.3)
Chloride: 99 mmol/L (ref 98–111)
Creatinine, Ser: 0.91 mg/dL (ref 0.44–1.00)
GFR calc Af Amer: >60 mL/min (ref >60)
GFR calc non Af Amer: >60 mL/min (ref >60)
Glucose, Bld: 137 mg/dL — ABNORMAL HIGH (ref 70–99)
Potassium: 3.4 mmol/L — ABNORMAL LOW (ref 3.5–5.1)
Sodium: 138 mmol/L (ref 135–145)

## 2019-11-28 LAB — PREGNANCY, URINE: Preg Test, Ur: NEGATIVE

## 2019-11-28 MED ORDER — LIDOCAINE VISCOUS HCL 2 % MT SOLN
15.0000 mL | Freq: Once | OROMUCOSAL | Status: AC
  Administered 2019-11-29: 15 mL via ORAL
  Filled 2019-11-28: qty 15

## 2019-11-28 MED ORDER — SODIUM CHLORIDE 0.9 % IV SOLN
1000.0000 mL | INTRAVENOUS | Status: DC
  Administered 2019-11-29: 500 mL via INTRAVENOUS

## 2019-11-28 MED ORDER — SODIUM CHLORIDE 0.9 % IV BOLUS (SEPSIS)
1000.0000 mL | Freq: Once | INTRAVENOUS | Status: AC
  Administered 2019-11-29: 1000 mL via INTRAVENOUS

## 2019-11-28 MED ORDER — ALUM & MAG HYDROXIDE-SIMETH 200-200-20 MG/5ML PO SUSP
30.0000 mL | Freq: Once | ORAL | Status: AC
  Administered 2019-11-29: 30 mL via ORAL
  Filled 2019-11-28: qty 30

## 2019-11-28 NOTE — ED Provider Notes (Signed)
[facility] EMERGENCY DEPARTMENT Provider Note  CSN: [id number] Arrival date & time: 11/28/19 2124  Chief Complaint(s) Chest Pain  HPI Michele [name] is a 49 y.o. female   The history is provided by the patient.  Chest Pain Pain location:  L chest Pain quality: aching, burning and dull   Radiates to: to right chest. Pain severity:  Moderate Onset quality:  Gradual Duration:  6 hours Timing:  Constant Progression:  Waxing and waning Chronicity:  New Relieved by:  Nothing Worsened by:  Exertion Associated symptoms: nausea, shortness of breath and vomiting   Associated symptoms: no anxiety and no fever   Risk factors: diabetes mellitus, high cholesterol, hypertension and obesity     Past Medical History Past Medical History:  Diagnosis Date  . Anemia    iron deficient, microcytic, hypochromic  . Anxiety   . Chest pain    atypical  . Depression   . Diabetes mellitus without complication ([place])   . Fatigue   . Fatty liver 08/15/2013  . Fibroids    uterine  . Hypertension   . Migraines   . 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 Posey Pronto at [facility]- S/p FNA March/april of two dominant nodules- benign.  Following for annual thyroid US with Dr. Posey Pronto.    . Palpitations    recurrent  . 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 Tachycardia    unspecified   Patient Active Problem List   Diagnosis Date Noted  . Vitamin D deficiency 01/04/2015  . Fibromyalgia 11/04/2014  . Depression 11/04/2014  . Migraine 10/01/2014  . Fatigue 03/02/2014  . Preventative health care 10/09/2013  . Fatty liver 08/15/2013  . Chest pain of uncertain etiology [id number]  . Diabetes type 2, controlled ([place]) 07/11/2012  . Anxiety and depression 11/13/2011  . OSA (obstructive sleep apnea) 10/31/2011  . Palpitations 12/13/2010  . 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 IRRITABLE BOWEL SYNDROME 08/01/2010  . FIBROIDS, UTERUS 05/24/2010  . Other iron  deficiency anemia 05/24/2010  . Essential hypertension 12/22/2009   Home Medication(s) Prior to Admission medications   Medication Sig Start Date End Date Taking? Authorizing Provider  ALPRAZolam Duanne Moron) 1 MG tablet Take 1 mg by mouth as needed. For anxiety    [provider]  EPIPEN 2-PAK 0.3 MG/0.3ML SOAJ injection daily as needed (FOR ALLERGY).  05/14/13   [provider]  hydrochlorothiazide (HYDRODIURIL) 25 MG tablet Take 1 tablet by mouth once daily 10/13/19   Debbrah Alar, NP  ibuprofen (ADVIL,MOTRIN) 800 MG tablet Take 800 mg by mouth 3 (three) times daily. 05/20/15   [provider]  ivabradine (CORLANOR) 5 MG TABS tablet TAKE 1 TABLET 2 HOURS PRIOR TO CT 11/11/19   Skeet Latch, MD  losartan (COZAAR) 100 MG tablet Take 1 tablet by mouth once daily 09/15/19   Debbrah Alar, NP  metoprolol tartrate (LOPRESSOR) 100 MG tablet TAKE 1 TABLET 2 HOURS PRIOR TO CT 11/11/19   Skeet Latch, MD  buPROPion (WELLBUTRIN XL) 150 MG 24 hr tablet Take 2 tablets (300 mg total) by mouth daily. 10/13/11 11/28/11  Debbrah Alar, NP  Past Surgical History Past Surgical History:  Procedure Laterality Date  . APPENDECTOMY  03/2009  . BIOPSY THYROID  November 28, 2009   Dr. Posey Pronto- endo  . DILATION AND CURETTAGE OF UTERUS  05/21/2011   Procedure: DILATATION AND CURETTAGE (D&C);  Surgeon: Elveria Royals;  Location: [facility];  Service: Gynecology;  Laterality: N/A;  dilitation and currettage/endometrial currettings  . TUBAL LIGATION  1997  . TUBOPLASTY / TUBOTUBAL ANASTOMOSIS  2005   Family History Family History  Problem Relation Age of Onset  . Hypertension Mother   . Cancer Mother 37       breast  . Cancer Father        colon  . Stroke Brother        handicapped due to complications from spinal meningitis  . Seizures Brother   .  Asthma Son   . Arthritis Maternal Grandmother   . Hypertension Maternal Grandmother   . Heart attack Maternal Grandmother   . Stroke Maternal Grandfather   . Hypertension Maternal Uncle     Social History Social History   Tobacco Use  . Smoking status: Never Smoker  . Smokeless tobacco: Never Used  . Tobacco comment: never used tobacco  Substance Use Topics  . Alcohol use: Yes    Comment: Once a month  . Drug use: No   Allergies Amlodipine, Bee venom, Bupropion, Clonidine derivatives, Hydralazine, Imitrex [sumatriptan], and Oxycodone  Review of Systems Review of Systems  Constitutional: Negative for fever.  Respiratory: Positive for shortness of breath.   Cardiovascular: Positive for chest pain.  Gastrointestinal: Positive for nausea and vomiting.   All other systems are reviewed and are negative for acute change except as noted in the HPI  Physical Exam Vital Signs  I have reviewed the triage vital signs BP 122/85   Pulse 100   Temp 99.1 F (37.3 C) (Oral)   Resp 19   Ht 5\' 4"  (1.626 m)   Wt 90.7 kg   LMP 06/11/2013   SpO2 98%   BMI 34.33 kg/m   Physical Exam Vitals reviewed.  Constitutional:      General: She is not in acute distress.    Appearance: She is well-developed. She is not diaphoretic.  HENT:     Head: Normocephalic and atraumatic.     Nose: Nose normal.  Eyes:     General: No scleral icterus.       Right eye: No discharge.        Left eye: No discharge.     Conjunctiva/sclera: Conjunctivae normal.     Pupils: Pupils are equal, round, and reactive to light.  Cardiovascular:     Rate and Rhythm: Regular rhythm. Tachycardia present.     Heart sounds: No murmur. No friction rub. No gallop.   Pulmonary:     Effort: Pulmonary effort is normal. No respiratory distress.     Breath sounds: Normal breath sounds. No stridor. No rales.  Abdominal:     General: There is no distension.     Palpations: Abdomen is soft.     Tenderness: There is  abdominal tenderness (mild discomfort) in the left upper quadrant.  Musculoskeletal:        General: No tenderness.     Cervical back: Normal range of motion and neck supple.  Skin:    General: Skin is warm and dry.     Findings: No erythema or rash.  Neurological:     Mental Status: She is alert and oriented to  person, place, and time.     ED Results and Treatments Labs (all labs ordered are listed, but only abnormal results are displayed) Labs Reviewed  BASIC METABOLIC PANEL - Abnormal; Notable for the following components:      Result Value   Potassium 3.4 (*)    Glucose, Bld 137 (*)    All other components within normal limits  CBC - Abnormal; Notable for the following components:   WBC 12.9 (*)    All other components within normal limits  URINALYSIS, ROUTINE W REFLEX MICROSCOPIC - Abnormal; Notable for the following components:   Hgb urine dipstick TRACE (*)    All other components within normal limits  URINALYSIS, MICROSCOPIC (REFLEX) - Abnormal; Notable for the following components:   Bacteria, UA FEW (*)    All other components within normal limits  PREGNANCY, URINE  TSH  T4, FREE  TROPONIN I (HIGH SENSITIVITY)  TROPONIN I (HIGH SENSITIVITY)                                                                                                                         EKG  EKG Interpretation  Date/Time:  Friday November 28 2019 21:27:51 EDT Ventricular Rate:  134 PR Interval:  130 QRS Duration: 80 QT Interval:  286 QTC Calculation: 427 R Axis:   69 Text Interpretation: Sinus tachycardia Nonspecific ST abnormality , new since last tracing Abnormal ECG Since last tracing rate faster Reconfirmed by Addison Lank [phone]) on 11/28/2019 11:41:04 PM      Radiology DG Chest 2 View  Result Date: 11/28/2019 CLINICAL DATA:  Chest pain EXAM: CHEST - 2 VIEW COMPARISON:  08/14/2016 FINDINGS: The heart size and mediastinal contours are within normal limits. Both lungs are clear.  The visualized skeletal structures are unremarkable. IMPRESSION: No active cardiopulmonary disease. Electronically Signed   By: Donavan Foil M.D.   On: 11/28/2019 22:46   CT Angio Chest PE W and/or Wo Contrast  Result Date: 11/29/2019 CLINICAL DATA:  Chest pain, tachycardia EXAM: CT ANGIOGRAPHY CHEST WITH CONTRAST TECHNIQUE: Multidetector CT imaging of the chest was performed using the standard protocol during bolus administration of intravenous contrast. Multiplanar CT image reconstructions and MIPs were obtained to evaluate the vascular anatomy. CONTRAST:  12mL OMNIPAQUE IOHEXOL 350 MG/ML SOLN COMPARISON:  06/24/2019 FINDINGS: Cardiovascular: No filling defects in the pulmonary arteries to suggest pulmonary emboli. Heart is normal size. Aorta is normal caliber. Scattered calcifications in the descending thoracic aorta. Mediastinum/Nodes: No mediastinal, hilar, or axillary adenopathy. Trachea and esophagus are unremarkable. Bilateral thyroid nodules noted, the largest in the right thyroid measures up to 2 cm. Lungs/Pleura: Lungs are clear. No focal airspace opacities or suspicious nodules. No effusions. Upper Abdomen: Imaging into the upper abdomen shows no acute findings. Musculoskeletal: Chest wall soft tissues are unremarkable. No acute bony abnormality. Review of the MIP images confirms the above findings. IMPRESSION: No evidence of pulmonary embolus. No acute cardiopulmonary disease. Bilateral thyroid nodules, the largest 2 cm in the  right thyroid lobe. Recommend thyroid US (ref: J Am Coll Radiol. 2015 Feb;12(2): 143-50). Aortic atherosclerosis. Electronically Signed   By: Rolm Baptise M.D.   On: 11/29/2019 00:38    Pertinent labs & imaging results that were available during my care of the patient were reviewed by me and considered in my medical decision making (see chart for details).  Medications Ordered in ED Medications  sodium chloride 0.9 % bolus 1,000 mL (0 mLs Intravenous Stopped 11/29/19  0142)    Followed by  0.9 %  sodium chloride infusion (0 mLs Intravenous Stopped 11/29/19 0500)  alum & mag hydroxide-simeth (MAALOX/MYLANTA) 200-200-20 MG/5ML suspension 30 mL (30 mLs Oral Given 11/29/19 0002)    And  lidocaine (XYLOCAINE) 2 % viscous mouth solution 15 mL (15 mLs Oral Given 11/29/19 0002)  iohexol (OMNIPAQUE) 350 MG/ML injection 100 mL (100 mLs Intravenous Contrast Given 11/29/19 0019)  sodium chloride 0.9 % bolus 1,000 mL (0 mLs Intravenous Stopped 11/29/19 0452)  acetaminophen (TYLENOL) tablet 1,000 mg (1,000 mg Oral Given 11/29/19 0306)                                                                                                                                    Procedures .1-3 Lead EKG Interpretation Performed by: Fatima Blank, MD Authorized by: Fatima Blank, MD     Interpretation: abnormal     ECG rate:  114   Rhythm: sinus tachycardia     Ectopy: none     Conduction: normal      (including critical care time)  Medical Decision Making / ED Course I have reviewed the nursing notes for this encounter and the patient's prior records (if available in EHR or on provided paperwork).   SHRONDA OLDFATHER was evaluated in Emergency Department on 11/29/2019 for the symptoms described in the history of present illness. She was evaluated in the context of the global COVID-19 pandemic, which necessitated consideration that the patient might be at risk for infection with the SARS-CoV-2 virus that causes COVID-19. Institutional protocols and algorithms that pertain to the evaluation of patients at risk for COVID-19 are in a state of rapid change based on information released by regulatory bodies including the CDC and federal and state organizations. These policies and algorithms were followed during the patient's care in the ED.  Patient rule out for ACS and PE.  No evidence of pneumonia.  No pneumothorax. Labs grossly reassuring without significant  anemia.  No significant electrolyte derangements or renal sufficiency.  TSH and T4 pending, though low suspicion for thyroid storm.  Patient with chest discomfort improved with GI cocktail.  However improved with IV fluids.  Patient will be discharged home. She will follow up on thyroid panel follow-up with PCP.      Final Clinical Impression(s) / ED Diagnoses Final diagnoses:  Tachycardia  Precordial pain   The patient appears reasonably screened and/or stabilized for discharge  and I doubt any other medical condition or other [facility] requiring further screening, evaluation, or treatment in the ED at this time prior to discharge. Safe for discharge with strict return precautions.  Disposition: Discharge  Condition: Good  I have discussed the results, Dx and Tx plan with the patient/family who expressed understanding and agree(s) with the plan. Discharge instructions discussed at length. The patient/family was given strict return precautions who verbalized understanding of the instructions. No further questions at time of discharge.    ED Discharge Orders    None        Follow Up: Debbrah Alar, NP [place] [phone]  Schedule an appointment as soon as possible for a visit        This chart was dictated using voice recognition software.  Despite best efforts to proofread,  errors can occur which can change the documentation meaning.   Fatima Blank, MD 11/29/19 [phone]

## 2019-11-28 NOTE — ED Triage Notes (Signed)
CP that began today, sees Dr Oval Linsey for tachycardia. Central chest pain that radiates 'in a butterfly pattern' causing a burning sensation. Lower back pain.

## 2019-11-29 ENCOUNTER — Encounter (HOSPITAL_BASED_OUTPATIENT_CLINIC_OR_DEPARTMENT_OTHER): Payer: Self-pay

## 2019-11-29 ENCOUNTER — Emergency Department (HOSPITAL_BASED_OUTPATIENT_CLINIC_OR_DEPARTMENT_OTHER): Payer: BC Managed Care – PPO

## 2019-11-29 DIAGNOSIS — R079 Chest pain, unspecified: Secondary | ICD-10-CM | POA: Diagnosis not present

## 2019-11-29 DIAGNOSIS — R000 Tachycardia, unspecified: Secondary | ICD-10-CM | POA: Diagnosis not present

## 2019-11-29 LAB — URINALYSIS, MICROSCOPIC (REFLEX)

## 2019-11-29 LAB — URINALYSIS, ROUTINE W REFLEX MICROSCOPIC
Bilirubin Urine: NEGATIVE
Glucose, UA: NEGATIVE mg/dL
Ketones, ur: NEGATIVE mg/dL
Leukocytes,Ua: NEGATIVE
Nitrite: NEGATIVE
Protein, ur: NEGATIVE mg/dL
Specific Gravity, Urine: 1.025 (ref 1.005–1.030)
pH: 5.5 (ref 5.0–8.0)

## 2019-11-29 LAB — TROPONIN I (HIGH SENSITIVITY): Troponin I (High Sensitivity): <2 ng/L (ref <18)

## 2019-11-29 LAB — T4, FREE: Free T4: 0.76 ng/dL (ref 0.61–1.12)

## 2019-11-29 LAB — TSH: TSH: 2.041 u[IU]/mL (ref 0.350–4.500)

## 2019-11-29 MED ORDER — IOHEXOL 350 MG/ML SOLN
100.0000 mL | Freq: Once | INTRAVENOUS | Status: AC | PRN
  Administered 2019-11-29: 100 mL via INTRAVENOUS

## 2019-11-29 MED ORDER — SODIUM CHLORIDE 0.9 % IV BOLUS
1000.0000 mL | Freq: Once | INTRAVENOUS | Status: AC
  Administered 2019-11-29: 1000 mL via INTRAVENOUS

## 2019-11-29 MED ORDER — ACETAMINOPHEN 500 MG PO TABS
1000.0000 mg | ORAL_TABLET | Freq: Once | ORAL | Status: AC
  Administered 2019-11-29: 1000 mg via ORAL
  Filled 2019-11-29: qty 2

## 2019-12-01 ENCOUNTER — Other Ambulatory Visit: Payer: Self-pay

## 2019-12-01 ENCOUNTER — Telehealth: Payer: Self-pay | Admitting: Cardiovascular Disease

## 2019-12-01 MED ORDER — METOPROLOL SUCCINATE ER 25 MG PO TB24: 25.0000 mg | ORAL_TABLET | Freq: Every day | ORAL | 3 refills | Status: DC

## 2019-12-01 NOTE — Telephone Encounter (Signed)
Pt c/o BP issue: STAT if pt c/o blurred vision, one-sided weakness or slurred speech  1. What are your last 5 BP readings?   BP:160/103 HR: 135  2. Are you having any other symptoms (ex. Dizziness, headache, blurred vision, passed out)? CP, vomitting  3. What is your BP issue? Patient states she has been experiencing rapid HR, CP, and high BP since Friday night. She states she went to the hospital and was there for 8 hours, however the symptoms remain.   Pt c/o of Chest Pain: STAT if CP now or developed within 24 hours  1. Are you having CP right now? No  2. Are you experiencing any other symptoms (ex. SOB, nausea, vomiting, sweating)? Vomitting  3. How long have you been experiencing CP? Since 8 PM Friday night  4. Is your CP continuous or coming and going? Continuous  5. Have you taken Nitroglycerin? No ?

## 2019-12-01 NOTE — Telephone Encounter (Signed)
Left message for patient to call back with questions and advised to go to the nearest emergency room with active chest pain.

## 2019-12-01 NOTE — Telephone Encounter (Signed)
Spoke with patient. Patient reports she was seen in ED Friday and discharged. Advised to take it easy over the weekend and follow up with PCP and Cardiology. Patient has an appointment on Friday with Dr. Oval Linsey. Patient has been having hypertension and tachycardia. She reports chronic chest pain. She reports her HR was 170 yesterday. Patient tachycardic with all activity. Patient currently 120 with a BP of 130/90. BP was 160/103. Patient complains of a headache. Patient has been drinking plenty of fluids. Information taken to DOD Dr. Stanford Breed and new order for Toprol XL 25mg  daily given. Prescription sent to preferred pharmacy. Patient informed of new orders and will keep follow up appointment on Friday.

## 2019-12-01 NOTE — Telephone Encounter (Signed)
Patient is calling back to follow up, stating she has additional questions.

## 2019-12-02 ENCOUNTER — Other Ambulatory Visit: Payer: Self-pay

## 2019-12-03 ENCOUNTER — Encounter: Payer: Self-pay | Admitting: Family

## 2019-12-03 ENCOUNTER — Ambulatory Visit: Payer: BC Managed Care – PPO | Admitting: Family

## 2019-12-03 ENCOUNTER — Other Ambulatory Visit: Payer: Self-pay

## 2019-12-03 VITALS — BP 137/81 | HR 103 | Temp 98.4°F | Resp 16 | Wt 198.0 lb

## 2019-12-03 DIAGNOSIS — G4733 Obstructive sleep apnea (adult) (pediatric): Secondary | ICD-10-CM

## 2019-12-03 DIAGNOSIS — E119 Type 2 diabetes mellitus without complications: Secondary | ICD-10-CM

## 2019-12-03 DIAGNOSIS — E042 Nontoxic multinodular goiter: Secondary | ICD-10-CM | POA: Diagnosis not present

## 2019-12-03 DIAGNOSIS — D72829 Elevated white blood cell count, unspecified: Secondary | ICD-10-CM | POA: Diagnosis not present

## 2019-12-03 LAB — CBC WITH DIFFERENTIAL/PLATELET
Basophils Absolute: 0 10*3/uL (ref 0.0–0.1)
Basophils Relative: 0.3 % (ref 0.0–3.0)
Eosinophils Absolute: 0.1 10*3/uL (ref 0.0–0.7)
Eosinophils Relative: 0.7 % (ref 0.0–5.0)
HCT: 41.4 % (ref 36.0–46.0)
Hemoglobin: 14.1 g/dL (ref 12.0–15.0)
Lymphocytes Relative: 39.3 % (ref 12.0–46.0)
Lymphs Abs: 2.8 10*3/uL (ref 0.7–4.0)
MCHC: 34.1 g/dL (ref 30.0–36.0)
MCV: 84.2 fl (ref 78.0–100.0)
Monocytes Absolute: 0.5 10*3/uL (ref 0.1–1.0)
Monocytes Relative: 7.4 % (ref 3.0–12.0)
Neutro Abs: 3.7 10*3/uL (ref 1.4–7.7)
Neutrophils Relative %: 52.3 % (ref 43.0–77.0)
Platelets: 228 10*3/uL (ref 150.0–400.0)
RBC: 4.91 Mil/uL (ref 3.87–5.11)
RDW: 14 % (ref 11.5–15.5)
WBC: 7.1 10*3/uL (ref 4.0–10.5)

## 2019-12-03 LAB — BASIC METABOLIC PANEL
BUN: 16 mg/dL (ref 6–23)
CO2: 31 mEq/L (ref 19–32)
Calcium: 9.9 mg/dL (ref 8.4–10.5)
Chloride: 100 mEq/L (ref 96–112)
Creatinine, Ser: 0.81 mg/dL (ref 0.40–1.20)
GFR: 90.49 mL/min (ref >60.00)
Glucose, Bld: 96 mg/dL (ref 70–99)
Potassium: 4.3 mEq/L (ref 3.5–5.1)
Sodium: 139 mEq/L (ref 135–145)

## 2019-12-03 LAB — HEMOGLOBIN A1C: Hgb A1c MFr Bld: 5.9 % (ref 4.6–6.5)

## 2019-12-03 NOTE — Patient Instructions (Signed)
Please complete lab work prior to leaving. Keep your upcoming appointment with Dr. Oval Linsey. You should be scheduled about your appointment with Dr. Elsworth Soho.

## 2019-12-03 NOTE — Progress Notes (Signed)
Subjective:    Patient ID: Michele [name], female    DOB: 1971-04-18, 49 y.o.   MRN: [id number]  HPI  Patient is a 49 yr old female who presents today for ED follow up.  She presented to the ED on 11/28/19 with left sided chest pain.  CXR was negative, CTA negative for pulmonary. Incidental finding was made of bilateral thyroid nodules. This is a known finding for which she has been followed by endocrinology (States that she sees Dr. Posey Pronto).  EKG notes sinus tachycardia with rate 114. Pt was given a GI cocktail and symptoms improved.  TSH , free T4 were both WNL.  Of note she saw cardiology on 3/2 for hx of CP and tachycardia- a cardiac monitor was ordered.  She also recommended a coronary CTA.    Lab Results  Component Value Date   HGBA1C 5.7 09/13/2018   HGBA1C 6.0 06/17/2018   HGBA1C 5.9 10/04/2017   Lab Results  Component Value Date   MICROALBUR 0.9 07/04/2017   LDLCALC 139 (H) 09/13/2018   CREATININE 0.91 11/28/2019     Review of Systems  See HPI  Past Medical History:  Diagnosis Date  . Anemia    iron deficient, microcytic, hypochromic  . Anxiety   . Chest pain    atypical  . Depression   . Diabetes mellitus without complication ([place])   . Fatigue   . Fatty liver 08/15/2013  . Fibroids    uterine  . Hypertension   . Migraines   . 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 Posey Pronto at [facility]- S/p FNA March/april of two dominant nodules- benign.  Following for annual thyroid US with Dr. Posey Pronto.    . Palpitations    recurrent  . 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 Tachycardia    unspecified     Social History   Socioeconomic History  . Marital status: Married    Spouse name: [name]  . Number of children: 2  . Years of education: Not on file  . Highest education level: Not on file  Occupational History  . Occupation: CLAIMS AGENT    Employer: [employer]  Tobacco Use  . Smoking status: Never Smoker  . Smokeless tobacco:  Never Used  . Tobacco comment: never used tobacco  Substance and Sexual Activity  . Alcohol use: Yes    Comment: Once a month  . Drug use: No  . Sexual activity: Yes    Birth control/protection: None, Surgical  Other Topics Concern  . 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 Difficulty of Paying Living Expenses:   Food Insecurity:   . Worried About Charity fundraiser in the Last Year:   . Arboriculturist in the Last Year:   Transportation Needs:   . Film/video editor (Medical):   Marland Kitchen Lack of Transportation (Non-Medical):   Physical Activity:   . Days of Exercise per Week:   . Minutes of Exercise per Session:   Stress:   . Feeling of Stress :   Social Connections:   . Frequency of Communication with Friends and Family:   . Frequency of Social Gatherings with Friends and Family:   .  Attends Religious Services:   . Active Member of Clubs or Organizations:   . Attends Archivist Meetings:   Marland Kitchen Marital Status:   Intimate Partner Violence:   . Fear of Current or Ex-Partner:   . Emotionally Abused:   Marland Kitchen Physically Abused:   . Sexually Abused:     Past Surgical History:  Procedure Laterality Date  . APPENDECTOMY  03/2009  . BIOPSY THYROID  November 28, 2009   Dr. Posey Pronto- endo  . DILATION AND CURETTAGE OF UTERUS  05/21/2011   Procedure: DILATATION AND CURETTAGE (D&C);  Surgeon: Elveria Royals;  Location: [facility];  Service: Gynecology;  Laterality: N/A;  dilitation and currettage/endometrial currettings  . TUBAL LIGATION  1997  . TUBOPLASTY / TUBOTUBAL ANASTOMOSIS  2005    Family History  Problem Relation Age of Onset  . Hypertension Mother   . Cancer Mother 97       breast  . Cancer Father        colon  . Stroke Brother         handicapped due to complications from spinal meningitis  . Seizures Brother   . Asthma Son   . Arthritis Maternal Grandmother   . Hypertension Maternal Grandmother   . Heart attack Maternal Grandmother   . Stroke Maternal Grandfather   . Hypertension Maternal Uncle     Allergies  Allergen Reactions  . Amlodipine Shortness Of Breath, Anxiety, Palpitations and Other (See Comments)  . Bee Venom Anaphylaxis  . Bupropion Shortness Of Breath, Anxiety, Palpitations and Other (See Comments)  . Clonidine Derivatives     dizziness  . Hydralazine     dizziness  . Imitrex [Sumatriptan] Nausea Only  . Oxycodone Nausea Only    Current Outpatient Medications on File Prior to Visit  Medication Sig Dispense Refill  . ALPRAZolam (XANAX) 1 MG tablet Take 1 mg by mouth as needed. For anxiety    . EPIPEN 2-PAK 0.3 MG/0.3ML SOAJ injection daily as needed (FOR ALLERGY).     . hydrochlorothiazide (HYDRODIURIL) 25 MG tablet Take 1 tablet by mouth once daily 90 tablet 0  . ibuprofen (ADVIL,MOTRIN) 800 MG tablet Take 800 mg by mouth 3 (three) times daily.  0  . ivabradine (CORLANOR) 5 MG TABS tablet TAKE 1 TABLET 2 HOURS PRIOR TO CT 1 tablet 0  . losartan (COZAAR) 100 MG tablet Take 1 tablet by mouth once daily 90 tablet 0  . metoprolol succinate (TOPROL XL) 25 MG 24 hr tablet Take 1 tablet (25 mg total) by mouth daily. 90 tablet 3  . metoprolol tartrate (LOPRESSOR) 100 MG tablet TAKE 1 TABLET 2 HOURS PRIOR TO CT 1 tablet 0  . [DISCONTINUED] buPROPion (WELLBUTRIN XL) 150 MG 24 hr tablet Take 2 tablets (300 mg total) by mouth daily. 60 tablet 1   No current facility-administered medications on file prior to visit.    BP 137/81 (BP Location: Left Arm, Patient Position: Sitting, Cuff Size: Small)   Pulse (!) 103   Temp 98.4 F (36.9 C) (Temporal)   Resp 16   Wt 198 lb (89.8 kg)   LMP 06/11/2013   SpO2 100%   BMI 33.99 kg/m       Objective:   Physical Exam Constitutional:      Appearance: She  is well-developed.  Neck:     Thyroid: No thyromegaly.  Cardiovascular:     Rate and Rhythm: Normal rate and regular rhythm.     Heart sounds: Normal heart sounds. No  murmur.  Pulmonary:     Effort: Pulmonary effort is normal. No respiratory distress.     Breath sounds: Normal breath sounds. No wheezing.  Musculoskeletal:     Cervical back: Neck supple.  Skin:    General: Skin is warm and dry.  Neurological:     Mental Status: She is alert and oriented to person, place, and time.  Psychiatric:        Behavior: Behavior normal.        Thought Content: Thought content normal.        Judgment: Judgment normal.           Assessment & Plan:  Bilateral Thyroid Nodules- TFT's normal in the ED. has a remote hx of negative thyroid biopsy.  States she is followed by Dr. Posey Pronto for this.  I cannot find any of his records in care everywhere. Will contact his office to request copy of last office visit.    Atypical chest pain- long standing.  Of note, the ED physician has concerns re: POTS.  Will defer work up to cardiology. I advised pt to keep her upcoming follow up appointment with cardiology.   [facility]- requests referral back to Dr. Elsworth Soho for follow up.   30 minutes spent on today's visit. Time was spent reviewing pt's chart, discussing cardiac work up and thyroid nodules.   This visit occurred during the SARS-CoV-2 public health emergency.  Safety protocols were in place, including screening questions prior to the visit, additional usage of staff PPE, and extensive cleaning of exam room while observing appropriate contact time as indicated for disinfecting solutions.

## 2019-12-04 ENCOUNTER — Telehealth: Payer: Self-pay | Admitting: Family

## 2019-12-04 NOTE — Telephone Encounter (Signed)
Can you please contact Dr. Serita Grit office (endo at [facility]) and ask when her last office visit was with him and request most recent office note?

## 2019-12-05 ENCOUNTER — Ambulatory Visit: Payer: BC Managed Care – PPO | Admitting: Cardiovascular Disease

## 2019-12-09 NOTE — Telephone Encounter (Signed)
Medical records release request faxed to Dr. Serita Grit office

## 2019-12-10 ENCOUNTER — Telehealth: Payer: Self-pay | Admitting: Family

## 2019-12-10 NOTE — Telephone Encounter (Signed)
See mychart.  

## 2019-12-10 NOTE — Telephone Encounter (Signed)
Can you please contact Dr. Posey Pronto, endocrinology and request her last office note? thanks

## 2019-12-11 NOTE — Telephone Encounter (Signed)
Records were faxed 12/10/19, Last OV is on Ruth's desk.

## 2019-12-16 ENCOUNTER — Encounter: Payer: Self-pay | Admitting: Cardiovascular Disease

## 2019-12-18 ENCOUNTER — Encounter: Payer: Self-pay | Admitting: *Deleted

## 2019-12-18 NOTE — Telephone Encounter (Signed)
    Elsie Lincoln, LPN  Good morning Michele [name]    I have attempted to contact this patient several times with no response, I am mailing 10 day letter if no response by 12/22/19 I will delete order .    Thank you  Jannet Askew   This encounter was created in error - please disregard.

## 2019-12-19 ENCOUNTER — Telehealth: Payer: Self-pay | Admitting: *Deleted

## 2019-12-19 NOTE — Telephone Encounter (Signed)
Could not reach patient by phone regarding appointment date and time change (12/30/19 to 01/01/20)---sent My Chart message

## 2019-12-25 ENCOUNTER — Telehealth: Payer: Self-pay | Admitting: Family

## 2019-12-25 DIAGNOSIS — E041 Nontoxic single thyroid nodule: Secondary | ICD-10-CM

## 2019-12-25 NOTE — Telephone Encounter (Signed)
Please advise pt that last visit with Dr. Posey Pronto was back in 2016.  I would like her to complete a thyroid US to further evaluate nodule seen on CT and follow back up with Dr. Posey Pronto. I have pended order/referral.

## 2019-12-25 NOTE — Telephone Encounter (Signed)
Called both numbers listed for patient, no answer and not able to leave voice mail. Will try to contact again tomorrow.

## 2019-12-26 ENCOUNTER — Other Ambulatory Visit: Payer: Self-pay | Admitting: Family

## 2019-12-26 NOTE — Telephone Encounter (Signed)
Yes, ruth is trying to reach her first to let her know. tks.

## 2019-12-26 NOTE — Telephone Encounter (Signed)
I don't see a referral from 4/15 for Dr. Posey Pronto. Are we waiting to talk to pt first?

## 2019-12-26 NOTE — Telephone Encounter (Signed)
Called patient back no answer and was able to leave message on cell for patient to call back. I also sent her a Pharmacist, community message.

## 2019-12-29 NOTE — Telephone Encounter (Signed)
Yes, I would recommend that she follow back up with Dr. Posey Pronto and complete US.  His last note in 2014 said that he wanted to see her back for a 6 month follow up.  Thyroid nodule is larger on recent CT than it was on the last Ultrasound in 2017 so I think it needs closer evaluation.

## 2019-12-29 NOTE — Telephone Encounter (Signed)
Message sent to patient with this information. Orders signed.

## 2019-12-29 NOTE — Telephone Encounter (Signed)
Patient wanted to let provider know she had [name] and aspiration biopsy of this nodule in 2016 by Dr. Posey Pronto and she also had a follow up US some time after that. She reports the follow up US showed no change in nodules.  She is asking if she still need another [name] and follow up with Dr. Posey Pronto?

## 2019-12-30 ENCOUNTER — Ambulatory Visit: Payer: BC Managed Care – PPO | Admitting: Cardiovascular Disease

## 2019-12-30 ENCOUNTER — Telehealth (HOSPITAL_COMMUNITY): Payer: Self-pay | Admitting: Emergency Medicine

## 2019-12-30 ENCOUNTER — Encounter (HOSPITAL_COMMUNITY): Payer: Self-pay

## 2019-12-30 NOTE — Telephone Encounter (Signed)
Attempted phone call. VM box full and cannot accept new messages. Pt has read [employer].   Marchia Bond RN Navigator Cardiac Imaging [facility] [phone] Office  [phone] Cell

## 2019-12-31 ENCOUNTER — Other Ambulatory Visit: Payer: Self-pay

## 2019-12-31 ENCOUNTER — Ambulatory Visit (HOSPITAL_COMMUNITY)
Admission: RE | Admit: 2019-12-31 | Discharge: 2019-12-31 | Disposition: A | Discharge status: Home / Self Care | Payer: BC Managed Care – PPO | Source: Ambulatory Visit | Attending: Cardiovascular Disease | Admitting: Cardiovascular Disease

## 2019-12-31 ENCOUNTER — Encounter (HOSPITAL_COMMUNITY): Payer: Self-pay

## 2019-12-31 ENCOUNTER — Encounter: Payer: BC Managed Care – PPO | Admitting: *Deleted

## 2019-12-31 DIAGNOSIS — R072 Precordial pain: Secondary | ICD-10-CM | POA: Diagnosis not present

## 2019-12-31 DIAGNOSIS — R002 Palpitations: Secondary | ICD-10-CM

## 2019-12-31 DIAGNOSIS — Z006 Encounter for examination for normal comparison and control in clinical research program: Secondary | ICD-10-CM

## 2019-12-31 MED ORDER — NITROGLYCERIN 0.4 MG SL SUBL: 0.8000 mg | SUBLINGUAL_TABLET | Freq: Once | SUBLINGUAL | Status: DC

## 2019-12-31 NOTE — Progress Notes (Signed)
Pt presented to [facility] IR to have a CT cardiac coronary scan.  Prior to arrival, pt states she took all of her medications which included 125mg  of metoprolol, 5mg  Ivabradine, and xanax.  While waiting at the nurses station, pt's HR ranged from 62-80.  Pt reports high anxiety around CT scanners.  While at the nurse's station, pt took an additional Xanax as pt felt her anxiety medication wearing off.  Upon arrival to the CT scanner, pt's HR ranged from 80-100 despite nonpharmacologic interventions to relax pt.  Dr. Aundra Dubin called and scan was canceled for today.  Marchia Bond, CT Navigator made aware.  Pt's IV removed and education provided about the cancellation. Pt escorted to waiting room without incident.

## 2019-12-31 NOTE — Research (Signed)
CADFEM Informed Consent                  Subject Name:   Michele [name]   Subject met inclusion and exclusion criteria.  The informed consent form, study requirements and expectations were reviewed with the subject and questions and concerns were addressed prior to the signing of the consent form.  The subject verbalized understanding of the trial requirements.  The subject agreed to participate in the CADFEM trial and signed the informed consent.  The informed consent was obtained prior to performance of any protocol-specific procedures for the subject.  A copy of the signed informed consent was given to the subject and a copy was placed in the subject's medical record.   [place] Cristie Mckinney, Research Assistant  12/31/2019 07:36

## 2020-01-01 ENCOUNTER — Encounter: Payer: Self-pay | Admitting: Cardiovascular Disease

## 2020-01-01 ENCOUNTER — Ambulatory Visit: Payer: BC Managed Care – PPO | Admitting: Cardiovascular Disease

## 2020-01-01 VITALS — BP 142/70 | HR 91 | Ht 65.0 in | Wt 204.0 lb

## 2020-01-01 DIAGNOSIS — I10 Essential (primary) hypertension: Secondary | ICD-10-CM | POA: Diagnosis not present

## 2020-01-01 DIAGNOSIS — F329 Major depressive disorder, single episode, unspecified: Secondary | ICD-10-CM

## 2020-01-01 DIAGNOSIS — R072 Precordial pain: Secondary | ICD-10-CM

## 2020-01-01 DIAGNOSIS — G4733 Obstructive sleep apnea (adult) (pediatric): Secondary | ICD-10-CM

## 2020-01-01 DIAGNOSIS — F32A Depression, unspecified: Secondary | ICD-10-CM

## 2020-01-01 DIAGNOSIS — R0602 Shortness of breath: Secondary | ICD-10-CM

## 2020-01-01 DIAGNOSIS — F419 Anxiety disorder, unspecified: Secondary | ICD-10-CM

## 2020-01-01 DIAGNOSIS — R002 Palpitations: Secondary | ICD-10-CM

## 2020-01-01 MED ORDER — METOPROLOL SUCCINATE ER 25 MG PO TB24: 25.0000 mg | ORAL_TABLET | Freq: Every day | ORAL | 3 refills | Status: DC

## 2020-01-01 NOTE — Patient Instructions (Signed)
Medication Instructions:  START METOPROLOL SUCC 25 MG DAILY AT BEDTIME  *If you need a refill on your cardiac medications before your next appointment, please call your pharmacy*  Lab Work: YOU WILL NEED COVID SCREENING 3 DAYS PRIOR TO [place] THIS YOU WILL NEED TO QUARANTINE YOURSELF UNTIL AFTER YOUR STRESS TEST  If you have labs (blood work) drawn today and your tests are completely normal, you will receive your results only by: Marland Kitchen MyChart Message (if you have MyChart) OR . A paper copy in the mail If you have any lab test that is abnormal or we need to change your treatment, we will call you to review the results.   Testing/Procedures: Your physician has requested that you have an echocardiogram. Echocardiography is a painless test that uses sound waves to create images of your heart. It provides your doctor with information about the size and shape of your heart and how well your heart's chambers and valves are working. This procedure takes approximately one hour. There are no restrictions for this procedure. [address] [address]  Your physician has requested that you have en exercise stress myoview. For further information please visit [email or web address]. Please follow instruction sheet, as given. HOLD YOUR METOPROLOL THE NIGHT BEFORE YOUR STRESS TEST   Follow-Up: At [facility], you and your health needs are our priority.  As part of our continuing mission to provide you with exceptional heart care, we have created designated Provider Care Teams.  These Care Teams include your primary Cardiologist (physician) and Advanced Practice Providers (APPs -  Physician Assistants and Nurse Practitioners) who all work together to provide you with the care you need, when you need it.  We recommend signing up for the patient portal called "MyChart".  Sign up information is provided on this After Visit Summary.  MyChart is used to connect with  patients for Virtual Visits (Telemedicine).  Patients are able to view lab/test results, encounter notes, upcoming appointments, etc.  Non-urgent messages can be sent to your provider as well.   To learn more about what you can do with MyChart, go to [email or web address].    Your next appointment:   4 week(s)  The format for your next appointment:   Virtual Visit   Provider:   You may see DR [facility] or one of the following Advanced Practice Providers on your designated Care Team:    Kerin Ransom, PA-C  [place], [place]  Coletta Memos, [place]  Other Instructions   Cardiac Nuclear Scan A cardiac nuclear scan is a test that is done to check the flow of blood to your heart. It is done when you are resting and when you are exercising. The test looks for problems such as:  Not enough blood reaching a portion of the heart.  The heart muscle not working as it should. You may need this test if:  You have heart disease.  You have had lab results that are not normal.  You have had heart surgery or a balloon procedure to open up blocked arteries (angioplasty).  You have chest pain.  You have shortness of breath. In this test, a special dye (tracer) is put into your bloodstream. The tracer will travel to your heart. A camera will then take pictures of your heart to see how the tracer moves through your heart. This test is usually done at a hospital and takes 2-4 hours. Tell a doctor about:  Any allergies  you have.  All medicines you are taking, including vitamins, herbs, eye drops, creams, and over-the-counter medicines.  Any problems you or family members have had with anesthetic medicines.  Any blood disorders you have.  Any surgeries you have had.  Any medical conditions you have.  Whether you are pregnant or may be pregnant. What are the risks? Generally, this is a safe test. However, problems may occur, such as:  Serious chest pain and heart attack. This is only  a risk if the stress portion of the test is done.  Rapid heartbeat.  A feeling of warmth in your chest. This feeling usually does not last long.  Allergic reaction to the tracer. What happens before the test?  Ask your doctor about changing or stopping your normal medicines. This is important.  Follow instructions from your doctor about what you cannot eat or drink.  Remove your jewelry on the day of the test. What happens during the test?  An IV tube will be inserted into one of your veins.  Your doctor will give you a small amount of tracer through the IV tube.  You will wait for 20-40 minutes while the tracer moves through your bloodstream.  Your heart will be monitored with an electrocardiogram (ECG).  You will lie down on an exam table.  Pictures of your heart will be taken for about 15-20 minutes.  You may also have a stress test. For this test, one of these things may be done: ? You will be asked to exercise on a treadmill or a stationary bike. ? You will be given medicines that will make your heart work harder. This is done if you are unable to exercise.  When blood flow to your heart has peaked, a tracer will again be given through the IV tube.  After 20-40 minutes, you will get back on the exam table. More pictures will be taken of your heart.  Depending on the tracer that is used, more pictures may need to be taken 3-4 hours later.  Your IV tube will be removed when the test is over. The test may vary among doctors and hospitals. What happens after the test?  Ask your doctor: ? Whether you can return to your normal schedule, including diet, activities, and medicines. ? Whether you should drink more fluids. This will help to remove the tracer from your body. Drink enough fluid to keep your pee (urine) pale yellow.  Ask your doctor, or the department that is doing the test: ? When will my results be ready? ? How will I get my results? Summary  A cardiac  nuclear scan is a test that is done to check the flow of blood to your heart.  Tell your doctor whether you are pregnant or may be pregnant.  Before the test, ask your doctor about changing or stopping your normal medicines. This is important.  Ask your doctor whether you can return to your normal activities. You may be asked to drink more fluids. This information is not intended to replace advice given to you by your health care provider. Make sure you discuss any questions you have with your health care provider. Document Revised: 12/18/2018 Document Reviewed: 02/11/2018 Elsevier Patient Education  [place].  Echocardiogram An echocardiogram is a procedure that uses painless sound waves (ultrasound) to produce an image of the heart. Images from an echocardiogram can provide important information about:  Signs of coronary artery disease (CAD).  Aneurysm detection. An aneurysm is a  weak or damaged part of an artery wall that bulges out from the normal force of blood pumping through the body.  Heart size and shape. Changes in the size or shape of the heart can be associated with certain conditions, including heart failure, aneurysm, and CAD.  Heart muscle function.  Heart valve function.  Signs of a past heart attack.  Fluid buildup around the heart.  Thickening of the heart muscle.  A tumor or infectious growth around the heart valves. Tell a health care provider about:  Any allergies you have.  All medicines you are taking, including vitamins, herbs, eye drops, creams, and over-the-counter medicines.  Any blood disorders you have.  Any surgeries you have had.  Any medical conditions you have.  Whether you are pregnant or may be pregnant. What are the risks? Generally, this is a safe procedure. However, problems may occur, including:  Allergic reaction to dye (contrast) that may be used during the procedure. What happens before the procedure? No specific  preparation is needed. You may eat and drink normally. What happens during the procedure?   An IV tube may be inserted into one of your veins.  You may receive contrast through this tube. A contrast is an injection that improves the quality of the pictures from your heart.  A gel will be applied to your chest.  A wand-like tool (transducer) will be moved over your chest. The gel will help to transmit the sound waves from the transducer.  The sound waves will harmlessly bounce off of your heart to allow the heart images to be captured in real-time motion. The images will be recorded on a computer. The procedure may vary among health care providers and hospitals. What happens after the procedure?  You may return to your normal, everyday life, including diet, activities, and medicines, unless your health care provider tells you not to do that. Summary  An echocardiogram is a procedure that uses painless sound waves (ultrasound) to produce an image of the heart.  Images from an echocardiogram can provide important information about the size and shape of your heart, heart muscle function, heart valve function, and fluid buildup around your heart.  You do not need to do anything to prepare before this procedure. You may eat and drink normally.  After the echocardiogram is completed, you may return to your normal, everyday life, unless your health care provider tells you not to do that. This information is not intended to replace advice given to you by your health care provider. Make sure you discuss any questions you have with your health care provider. Document Revised: 12/19/2018 Document Reviewed: 09/30/2016 Elsevier Patient Education  [place].

## 2020-01-01 NOTE — Progress Notes (Signed)
Cardiology Office Note   Date:  01/01/20  ID:  Michele [name], DOB February 20, 1971, MRN [id number]  PCP:  Debbrah Alar, NP  Cardiologist:   Skeet Latch, MD   No chief complaint on file.    History of Present Illness: Michele [name] is a 49 y.o. female with hypertension, palpitations, chronic chest pain, and OSA here for follow up.  She was initially seen 11/2019 for the evaluation of chest pain.  Michele [name] reports having chest pain for years.  She feels like her chest gets tight and her heart starts racing.  It occurs when sitting at her desk.  She can feel her heart pounding in the ears.  Sometimes it even wakes her up from sleep.  She also notes that when she tries to exert herself even minimally her heart starts to race.She started exercising regularly but hasn't seen improvements in her exertional capacity.  She has a [employer] but cannot participate fully because her heart starts racing too fast.  She previously had a sleep study and was told she had mild sleep apnea and did not require a CPAP machine.  She notes that she has gained some weight since this time.  Her blood pressure at home is typically well-controlled.  In the past she was told that her chest pain was related to anxiety.  However she really does not think it is anxiety.  She takes Xanax, which does not help.  She had heart catheterization in 2013 that showed normal coronaries. She last saw Dr. Burt Knack in 2014 and complained of frequent palpitations. Echo 08/2013 revealed LVEF 50 to 55% with mild mitral regurgitation. She was offered a trial of metoprolol but didn't use it.  Michele [name] was referred for a 7 day Event Monitor that showed no arrhythmias.  Her average heart rate was 103 bpm.  She only wore her heart monitor for 3 hours because it made her anxious.  She was referred for a coronary CT-A that could not be completed due to faster heart rates despite metoprolol, ibavardine and  Xanax.  She continues to have chest pain and her heart rate continues to be elevated.  She reports shortness of breath and leg pain.  Her blood pressure was high when she went for cardiac CT.  She has been waking up in the middle of the night with her lungs burning.  She has been referred to pulmonary.  She admits that she has struggled with anxiety in the past.  She did not tolerate Prozac or Cymbalta and has been uninterested in trying anything else.   Past Medical History:  Diagnosis Date  . Anemia    iron deficient, microcytic, hypochromic  . Anxiety   . Chest pain    atypical  . Depression   . Diabetes mellitus without complication ([place])   . Fatigue   . Fatty liver 08/15/2013  . Fibroids    uterine  . Hypertension   . Migraines   . 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 Posey Pronto at [facility]- S/p FNA March/april of two dominant nodules- benign.  Following for annual thyroid US with Dr. Posey Pronto.    . Palpitations    recurrent  . 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 Tachycardia    unspecified    Past Surgical History:  Procedure Laterality Date  . APPENDECTOMY  03/2009  . BIOPSY THYROID  November 28, 2009   Dr. Posey Pronto- endo  . DILATION AND CURETTAGE OF UTERUS  05/21/2011   Procedure: DILATATION AND CURETTAGE (D&C);  Surgeon: Elveria Royals;  Location: [facility];  Service: Gynecology;  Laterality: N/A;  dilitation and currettage/endometrial currettings  . TUBAL LIGATION  1997  . TUBOPLASTY / TUBOTUBAL ANASTOMOSIS  2005     Current Outpatient Medications  Medication Sig Dispense Refill  . ALPRAZolam (XANAX) 1 MG tablet Take 1 mg by mouth as needed. For anxiety    . EPIPEN 2-PAK 0.3 MG/0.3ML SOAJ injection daily as needed (FOR ALLERGY).     . hydrochlorothiazide (HYDRODIURIL) 25 MG tablet Take 1 tablet by mouth once daily 90 tablet 0  . ibuprofen (ADVIL,MOTRIN) 800 MG tablet Take 800 mg by mouth 3 (three) times daily.  0  . losartan (COZAAR) 100 MG tablet Take 1  tablet by mouth once daily 90 tablet 0  . metoprolol succinate (TOPROL XL) 25 MG 24 hr tablet Take 1 tablet (25 mg total) by mouth at bedtime. 90 tablet 3   No current facility-administered medications for this visit.    Allergies:   Amlodipine, Bee venom, Bupropion, Clonidine derivatives, Hydralazine, Imitrex [sumatriptan], and Oxycodone    Social History:  The patient  reports that she has never smoked. She has never used smokeless tobacco. She reports current alcohol use. She reports that she does not use drugs.   Family History:  The patient's family history includes Arthritis in her maternal grandmother; Asthma in her son; Cancer in her father; Cancer (age of onset: 8) in her mother; Heart attack in her maternal grandmother; Hypertension in her maternal grandmother, maternal uncle, and mother; Seizures in her brother; Stroke in her brother and maternal grandfather.    ROS:  Please see the history of present illness.   Otherwise, review of systems are positive for none.   All other systems are reviewed and negative.    PHYSICAL EXAM: VS:  BP (!) 142/70   Pulse 91   Ht 5\' 5"  (1.651 m)   Wt 204 lb (92.5 kg)   LMP 06/11/2013   SpO2 98%   BMI 33.95 kg/m  , BMI Body mass index is 33.95 kg/m. GENERAL:  Well appearing HEENT:  Pupils equal round and reactive, fundi not visualized, oral mucosa unremarkable NECK:  No jugular venous distention, waveform within normal limits, carotid upstroke brisk and symmetric, no bruits LUNGS:  Clear to auscultation bilaterally HEART:  RRR.  PMI not displaced or sustained,S1 and S2 within normal limits, no S3, no S4, no clicks, no rubs, no murmurs ABD:  Flat, positive bowel sounds normal in frequency in pitch, no bruits, no rebound, no guarding, no midline pulsatile mass, no hepatomegaly, no splenomegaly EXT:  2 plus pulses throughout, no edema, no cyanosis no clubbing SKIN:  No rashes no nodules NEURO:  Cranial nerves II through XII grossly intact,  motor grossly intact throughout PSYCH:  Cognitively intact, oriented to person place and time    EKG:  EKG is ordered today. The ekg ordered 12/05/2019 demonstrates sinus rhythm.  Rate 97 bpm. 01/01/2020: Sinus rhythm.  91 bpm.  Nonspecific ST changes.   7 Day Event Monitor 12/2019:  Quality: Fair.  Baseline artifact. Predominant rhythm: sinus tachycardia Average heart rate: 103 bpm Max heart rate: 172 bpm Min heart rate: 76 bpm  No arrhythmias  Echo 08/2013: Study Conclusions   - Left ventricle: The cavity size was normal. Wall thickness  was normal. Systolic function was normal. The estimated  ejection fraction was in the range of 50% to 55%. Wall  motion was  normal; there were no regional wall motion  abnormalities. Left ventricular diastolic function  parameters were normal.  - Mitral valve: Mild regurgitation.  - Atrial septum: No defect or patent foramen ovale was  identified.   Renal artery Doppler 12/25/11: normal renal arteries  Recent Labs: 06/24/2019: ALT 18; B Natriuretic Peptide 9.5 11/29/2019: TSH 2.041 12/03/2019: BUN 16; Creatinine, Ser 0.81; Hemoglobin 14.1; Platelets 228.0; Potassium 4.3; Sodium 139    Lipid Panel    Component Value Date/Time   CHOL 195 09/13/2018 1017   TRIG 71.0 09/13/2018 1017   HDL 42.20 09/13/2018 1017   CHOLHDL 5 09/13/2018 1017   VLDL 14.2 09/13/2018 1017   LDLCALC 139 (H) 09/13/2018 1017      Wt Readings from Last 3 Encounters:  01/01/20 204 lb (92.5 kg)  12/03/19 198 lb (89.8 kg)  11/28/19 200 lb (90.7 kg)      ASSESSMENT AND PLAN:  # Tachycardia: Michele [name] seems to have an exaggerated heart rate response to exercise.  She also has tachycardia at rest.  She only wore her monitor for 3 hours because it caused her to be anxious.  I suspect that this is a big part of her problem.  She is extremely anxious and tense on interview today.  We discussed the fact that she really needs to be treated  for her anxiety.  She is going to think by this and talk with her provider.  She did not tolerate medications in the past.  We will get a echocardiogram to ensure that she does not have any evidence of structural heart disease or tachycardia mediated cardiomyopathy.  We will also start metoprolol 25 mg twice daily.  This may also help with her anxiety but is not the ultimate solution. I am suspicious that OSA may be contributing.  It has been a long time since her last sleep study.  She has episodes of tachycardia or profound bradycardia overnight we will consider repeating her sleep study.  She already has the appropriate lab testing and it was unremarkable.  # Chest pain:  Michele [name] has chest pain that does not seem to be cardiac.  She had a CT PE protocol that was negative for PE and did not demonstrate any evidence of coronary plaque.  She was unable to get the coronary CTA due to tachycardia despite metoprolol and ivabradine.  We will get an exercise Myoview instead.  She understands not to take the metoprolol prior to her stress test.  # Essential hypertension:   Blood pressure was poorly controlled here but she reports that it is well controlled at home.  She is anxious about this appointment.  Adding metoprolol as above.  Continue hydrochlorothiazide and losartan for now.   Current medicines are reviewed at length with the patient today.  The patient does not have concerns regarding medicines.  The following changes have been made:  no change  Labs/ tests ordered today include:   Orders Placed This Encounter  Procedures  . MYOCARDIAL PERFUSION IMAGING  . EKG 12-Lead  . ECHOCARDIOGRAM COMPLETE   Time spent: 40 minutes-Greater than 50% of this time was spent in counseling, explanation of diagnosis, planning of further management, and coordination of care.   Disposition:   FU with Kawanna Christley C. Oval Linsey, MD, [facility] in 1 month.    Signed, Naol Ontiveros C. Oval Linsey, MD, [facility]    01/21/2020 11:08 AM    [place]

## 2020-01-05 ENCOUNTER — Encounter: Payer: Self-pay | Admitting: General Practice

## 2020-01-06 ENCOUNTER — Ambulatory Visit (HOSPITAL_BASED_OUTPATIENT_CLINIC_OR_DEPARTMENT_OTHER): Payer: BC Managed Care – PPO

## 2020-01-13 ENCOUNTER — Other Ambulatory Visit: Payer: Self-pay

## 2020-01-13 ENCOUNTER — Ambulatory Visit (HOSPITAL_BASED_OUTPATIENT_CLINIC_OR_DEPARTMENT_OTHER)
Admission: RE | Admit: 2020-01-13 | Discharge: 2020-01-13 | Disposition: A | Discharge status: Home / Self Care | Payer: BC Managed Care – PPO | Source: Ambulatory Visit | Attending: Family | Admitting: Family

## 2020-01-13 DIAGNOSIS — E042 Nontoxic multinodular goiter: Secondary | ICD-10-CM | POA: Diagnosis not present

## 2020-01-13 DIAGNOSIS — E041 Nontoxic single thyroid nodule: Secondary | ICD-10-CM | POA: Insufficient documentation

## 2020-01-14 ENCOUNTER — Inpatient Hospital Stay (HOSPITAL_COMMUNITY): Admission: RE | Admit: 2020-01-14 | Payer: BC Managed Care – PPO | Source: Ambulatory Visit

## 2020-01-14 ENCOUNTER — Telehealth: Payer: Self-pay | Admitting: Family

## 2020-01-14 NOTE — Telephone Encounter (Signed)
Please contact pt and let her know that her thyroid ultrasound is showing that some of her thyroid nodules have enlarged. I would like her to follow up with Dr. Posey Pronto.  Referral has already been placed a few weeks back.  Hopefully they have contacted her to schedule an appointment already.

## 2020-01-16 NOTE — Telephone Encounter (Signed)
Called both numbers listed but no answer and unable to leave voice mail.  MyChart message sent  Out to patient with this information.

## 2020-01-16 NOTE — Telephone Encounter (Signed)
Patient replied to MyChart message and she. She reported she will call Dr. Serita Grit office.

## 2020-01-21 ENCOUNTER — Other Ambulatory Visit: Payer: Self-pay

## 2020-01-21 ENCOUNTER — Ambulatory Visit (HOSPITAL_COMMUNITY): Payer: BC Managed Care – PPO | Attending: Internal Medicine

## 2020-01-21 ENCOUNTER — Encounter: Payer: Self-pay | Admitting: Cardiovascular Disease

## 2020-01-21 DIAGNOSIS — R072 Precordial pain: Secondary | ICD-10-CM | POA: Diagnosis not present

## 2020-01-21 DIAGNOSIS — R0602 Shortness of breath: Secondary | ICD-10-CM

## 2020-01-28 ENCOUNTER — Telehealth: Payer: BC Managed Care – PPO | Admitting: General Practice

## 2020-01-28 ENCOUNTER — Other Ambulatory Visit: Payer: Self-pay | Admitting: Family

## 2020-01-30 ENCOUNTER — Telehealth: Payer: BC Managed Care – PPO | Admitting: General Practice

## 2020-02-02 ENCOUNTER — Other Ambulatory Visit (HOSPITAL_COMMUNITY): Payer: BC Managed Care – PPO

## 2020-02-03 ENCOUNTER — Telehealth (HOSPITAL_COMMUNITY): Payer: Self-pay

## 2020-02-03 ENCOUNTER — Telehealth: Payer: Self-pay | Admitting: Cardiovascular Disease

## 2020-02-03 NOTE — Telephone Encounter (Signed)
Patient is requesting to reschedule COVID-19 test and stress test. Please call to assist.

## 2020-02-03 NOTE — Telephone Encounter (Signed)
Encounter complete. 

## 2020-02-03 NOTE — Telephone Encounter (Signed)
Spoke with pt, stress test and covid testing scheduled. Nuclear instructions regarding medications and restrictions discussed in detail and the patient voiced understanding.

## 2020-02-05 ENCOUNTER — Inpatient Hospital Stay (HOSPITAL_COMMUNITY): Admission: RE | Admit: 2020-02-05 | Payer: BC Managed Care – PPO | Source: Ambulatory Visit

## 2020-02-10 ENCOUNTER — Other Ambulatory Visit (HOSPITAL_COMMUNITY)
Admission: RE | Admit: 2020-02-10 | Discharge: 2020-02-10 | Disposition: A | Discharge status: Home / Self Care | Payer: BC Managed Care – PPO | Source: Ambulatory Visit | Attending: Cardiovascular Disease | Admitting: Cardiovascular Disease

## 2020-02-10 ENCOUNTER — Telehealth (HOSPITAL_COMMUNITY): Payer: Self-pay

## 2020-02-10 DIAGNOSIS — Z01812 Encounter for preprocedural laboratory examination: Secondary | ICD-10-CM | POA: Insufficient documentation

## 2020-02-10 DIAGNOSIS — Z20822 Contact with and (suspected) exposure to covid-19: Secondary | ICD-10-CM | POA: Diagnosis not present

## 2020-02-10 NOTE — Telephone Encounter (Signed)
Encounter complete. 

## 2020-02-11 ENCOUNTER — Telehealth (HOSPITAL_COMMUNITY): Payer: Self-pay

## 2020-02-11 LAB — SARS CORONAVIRUS 2 (TAT 6-24 HRS): SARS Coronavirus 2: NEGATIVE

## 2020-02-11 NOTE — Telephone Encounter (Signed)
Encounter complete. 

## 2020-02-12 ENCOUNTER — Ambulatory Visit (HOSPITAL_COMMUNITY)
Admission: RE | Admit: 2020-02-12 | Discharge: 2020-02-12 | Disposition: A | Discharge status: Home / Self Care | Payer: BC Managed Care – PPO | Source: Ambulatory Visit | Attending: Cardiology | Admitting: Cardiology

## 2020-02-12 ENCOUNTER — Other Ambulatory Visit: Payer: Self-pay

## 2020-02-12 DIAGNOSIS — R072 Precordial pain: Secondary | ICD-10-CM | POA: Diagnosis not present

## 2020-02-12 DIAGNOSIS — R0602 Shortness of breath: Secondary | ICD-10-CM | POA: Insufficient documentation

## 2020-02-12 LAB — MYOCARDIAL PERFUSION IMAGING
Estimated workload: 7.3 METS
Exercise duration (min): 6 min
Exercise duration (sec): 15 s
LV dias vol: 75 mL (ref 46–106)
LV sys vol: 26 mL
MPHR: 170 {beats}/min
Peak HR: 166 {beats}/min
Percent HR: 97 %
Rest HR: 106 {beats}/min
SDS: 0
SRS: 0
SSS: 0
TID: 0.98

## 2020-02-12 MED ORDER — TECHNETIUM TC 99M TETROFOSMIN IV KIT
9.6000 | PACK | Freq: Once | INTRAVENOUS | Status: AC | PRN
  Administered 2020-02-12: 9.6 via INTRAVENOUS
  Filled 2020-02-12: qty 10

## 2020-02-12 MED ORDER — TECHNETIUM TC 99M TETROFOSMIN IV KIT
31.0000 | PACK | Freq: Once | INTRAVENOUS | Status: AC | PRN
  Administered 2020-02-12: 31 via INTRAVENOUS
  Filled 2020-02-12: qty 31

## 2020-02-12 NOTE — Progress Notes (Addendum)
Virtual Visit via Telephone Note   This visit type was conducted due to national recommendations for restrictions regarding the COVID-19 Pandemic (e.g. social distancing) in an effort to limit this patient's exposure and mitigate transmission in our community.  Due to her co-morbid illnesses, this patient is at least at moderate risk for complications without adequate follow up.  This format is felt to be most appropriate for this patient at this time.  The patient did not have access to video technology/had technical difficulties with video requiring transitioning to audio format only (telephone).  All issues noted in this document were discussed and addressed.  No physical exam could be performed with this format.  Please refer to the patient's chart for her  consent to telehealth for [facility].  Evaluation Performed:  Follow-up visit  This visit type was conducted due to national recommendations for restrictions regarding the COVID-19 Pandemic (e.g. social distancing).  This format is felt to be most appropriate for this patient at this time.  All issues noted in this document were discussed and addressed.  No physical exam was performed (except for noted visual exam findings with Video Visits).  Please refer to the patient's chart (MyChart message for video visits and phone note for telephone visits) for the patient's consent to telehealth for [place]  Date:  02/13/2020   ID:  Michele [name], DOB [birth year], MRN [id number]  Patient Location:  [place] [address]   Provider location:     [address] Office [phone] Fax [phone]   PCP:  Debbrah Alar, NP  Cardiologist:  Skeet Latch, MD   Electrophysiologist:  None   Chief Complaint: Follow-up for chest pain/stress test review  History of Present Illness:    Michele [name] is a 49 y.o. female who  presents via audio/video conferencing for a telehealth visit today.  Patient verified DOB and address.  She has a past medical history of hypertension, palpitations, chronic chest pain, and obstructive sleep apnea.  She was initially seen on 3/21 for an evaluation of her chest discomfort.  She reported having chest pain for years.  She indicated that it felt like her chest would get tight and her heart would start racing.  It occurred when she was sitting at her desk.  She could also feel her heartbeat in her ears.  Sometimes the heart pounding with wake her up from sleep.  She also noted that if she tried to do any physical activity her heart would start to race.  She had started exercising regularly but had not seen improvement in her symptoms or endurance.  She is riding a Peloton but was unable to complete bouts of exercise due to her high heart rate.  She previously underwent an evaluation for OSA and was told she had mild sleep apnea.  She was told that she did not require a CPAP device.  She indicated that she had gained weight since her previous sleep evaluation.  Her blood pressure at home was well controlled.  She also indicated that in the past she was told that her chest pain was related to anxiety.  She indicated that she had taken Xanax for her anxiety and it did not help her chest discomfort.  So, she did not believe her chest discomfort was related to anxiety states.  She underwent cardiac catheterization in 2013 which showed normal coronary arteries.  She was seen by Dr.  Cooper in 2014 for frequent palpitations.  An echocardiogram 12/14 showed an LVEF of 50-55%, mild mitral regurgitation.  At that time she was offered metoprolol but deferred use.  She wore a 7-day event monitor that showed no arrhythmias.  Her average heart rate was 103 bpm.  She only wore her heart rate monitor for 3 hours due to anxiousness.  She underwent coronary CTA that could not be completed due to increased heart rate  despite metoprolol,ibavardine, and Xanax.  She was last seen by Dr. Oval Linsey on 01/01/2020.  During that time she continued to have chest pain and her heart rate continued to be elevated.  She reported shortness of breath and leg pain.  Her blood pressure was also elevated when she went for cardiac CT.  She indicated that she had been waking up in the middle of the night with a sensation of burning in her lungs.  She was referred to pulmonary.  She admitted to struggling with anxiety in the past.  She did not tolerate Prozac or Cymbalta and was not interested in trying new medical therapy.  She is seen for further evaluation today virtually and states she did not feel well during her stress test on 2020-02-12.  She felt that her heart rate was too high.  I spoke with her about her elevated blood pressure and we will switch her losartan to valsartan.  She also indicated that her energy level has been greatly decreased since starting metoprolol.  I will decrease it to 12.5 daily.  We will have her continue to eat a low-sodium diet.  I have instructed her to increase her physical activity slowly.  We will check a BMP in 2 weeks and have her follow-up with me in 1 month.  She also indicated that she had some lower extremity swelling.  I will give her the [employer] elastics handout.  Today she denies increased chest pain, shortness of breath, increased lower extremity edema, fatigue, palpitations, melena, hematuria, hemoptysis, diaphoresis, weakness, presyncope, syncope, orthopnea, and PND.   The patient does not symptoms concerning for COVID-19 infection (fever, chills, cough, or new SHORTNESS OF BREATH).    Prior CV studies:   The following studies were reviewed today:  EKG 12/05/2019 Sinus rhythm 97 bpm  EKG 01/01/2020 Sinus rhythm 91 bpm nonspecific ST changes  Cardiac event monitor 12/2019 Quality: Fair. Baseline artifact. Predominant rhythm: sinus tachycardia Average heart rate: 103 bpm Max heart  rate: 172 bpm Min heart rate: 76 bpm  No arrhythmias  Echocardiogram 12/14 Study Conclusions   - Left ventricle: The cavity size was normal. Wall thickness  was normal. Systolic function was normal. The estimated  ejection fraction was in the range of 50% to 55%. Wall  motion was normal; there were no regional wall motion  abnormalities. Left ventricular diastolic function  parameters were normal.  - Mitral valve: Mild regurgitation.  - Atrial septum: No defect or patent foramen ovale was  identified.   Renal artery Doppler 12/25/11: normal renal arteries  Echocardiogram 01/21/2020 IMPRESSIONS    1. Left ventricular ejection fraction, by estimation, is 60 to 65%. The  left ventricle has normal function. The left ventricle has no regional  wall motion abnormalities. There is mild left ventricular hypertrophy.  Left ventricular diastolic parameters  are consistent with Grade I diastolic dysfunction (impaired relaxation).  2. Right ventricular systolic function is hyperdynamic. The right  ventricular size is normal. Tricuspid regurgitation signal is inadequate  for assessing PA pressure.  3. The mitral  valve is grossly normal. Trivial mitral valve  regurgitation.  4. The aortic valve is tricuspid. Aortic valve regurgitation is not  visualized.  5. The inferior vena cava is normal in size with greater than 50%  respiratory variability, suggesting right atrial pressure of 3 mmHg.  Nuclear stress test 02/12/2020 The left ventricular ejection fraction is hyperdynamic (>65%).   Nuclear stress EF: 66%.   Blood pressure demonstrated a hypertensive response to exercise.   There was no ST segment deviation noted during stress.   The study is normal.   This is a low risk study.    Normal stress nuclear study with no ischemia or infarction. Gated  ejection fraction 66% with normal wall motion.   Past Medical History:  Diagnosis Date  . Anemia    iron  deficient, microcytic, hypochromic  . Anxiety   . Chest pain    atypical  . Depression   . Diabetes mellitus without complication ([place])   . Fatigue   . Fatty liver 08/15/2013  . Fibroids    uterine  . Hypertension   . Migraines   . 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 Posey Pronto at [facility]- S/p FNA March/april of two dominant nodules- benign.  Following for annual thyroid US with Dr. Posey Pronto.    . Palpitations    recurrent  . 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 Tachycardia    unspecified   Past Surgical History:  Procedure Laterality Date  . APPENDECTOMY  03/2009  . BIOPSY THYROID  November 28, 2009   Dr. Posey Pronto- endo  . DILATION AND CURETTAGE OF UTERUS  05/21/2011   Procedure: DILATATION AND CURETTAGE (D&C);  Surgeon: Elveria Royals;  Location: [facility];  Service: Gynecology;  Laterality: N/A;  dilitation and currettage/endometrial currettings  . TUBAL LIGATION  1997  . TUBOPLASTY / TUBOTUBAL ANASTOMOSIS  2005     Current Meds  Medication Sig  . EPIPEN 2-PAK 0.3 MG/0.3ML SOAJ injection daily as needed (FOR ALLERGY).   . hydrochlorothiazide (HYDRODIURIL) 25 MG tablet Take 1 tablet by mouth once daily  . ibuprofen (ADVIL,MOTRIN) 800 MG tablet Take 800 mg by mouth 3 (three) times daily.  . metoprolol succinate (TOPROL XL) 25 MG 24 hr tablet Take 0.5 tablets (12.5 mg total) by mouth at bedtime.  . [DISCONTINUED] losartan (COZAAR) 100 MG tablet Take 1 tablet by mouth once daily  . [DISCONTINUED] metoprolol succinate (TOPROL XL) 25 MG 24 hr tablet Take 1 tablet (25 mg total) by mouth at bedtime.     Allergies:   Amlodipine, Bee venom, Bupropion, Clonidine derivatives, Hydralazine, Imitrex [sumatriptan], and Oxycodone   Social History   Tobacco Use  . Smoking status: Never Smoker  . Smokeless tobacco: Never Used  . Tobacco comment: never used tobacco  Substance Use Topics  . Alcohol use: Yes    Comment: Once a month  . Drug use: No     Family Hx: The  patient's family history includes Arthritis in her maternal grandmother; Asthma in her son; Cancer in her father; Cancer (age of onset: 81) in her mother; Heart attack in her maternal grandmother; Hypertension in her maternal grandmother, maternal uncle, and mother; Seizures in her brother; Stroke in her brother and maternal grandfather.  ROS:   Please see the history of present illness.     All other systems reviewed and are negative.   Labs/Other Tests and Data Reviewed:    Recent Labs: 06/24/2019: ALT 18; B Natriuretic Peptide 9.5 11/29/2019:  TSH 2.041 12/03/2019: BUN 16; Creatinine, Ser 0.81; Hemoglobin 14.1; Platelets 228.0; Potassium 4.3; Sodium 139   Recent Lipid Panel Lab Results  Component Value Date/Time   CHOL 195 09/13/2018 10:17 AM   TRIG 71.0 09/13/2018 10:17 AM   HDL 42.20 09/13/2018 10:17 AM   CHOLHDL 5 09/13/2018 10:17 AM   LDLCALC 139 (H) 09/13/2018 10:17 AM    Wt Readings from Last 3 Encounters:  02/13/20 203 lb (92.1 kg)  02/12/20 204 lb (92.5 kg)  01/01/20 204 lb (92.5 kg)     Exam:    Vital Signs:  BP (!) 136/92   Pulse 92   Wt 203 lb (92.1 kg)   LMP 06/11/2013   BMI 33.78 kg/m    Well nourished, well developed female in no  acute distress.   ASSESSMENT & PLAN:    1.  Tachycardia-only able to wear cardiac event monitor for 3 hours due to anxiousness.  Previously discussed need for anxiety treatment.  Previously recommended that she speak with PCP related to anxiety treatment.  Echocardiogram showed normal EF 60-65%, mild LVH, and G1 DD. reduce metoprolol 12.5 mg  Mindfulness stress reduction sheet given Heart healthy low-sodium diet Avoid triggers caffeine, chocolate, EtOH etc. Increase physical activity as tolerated-progress slowly  Chest pain-chronic in nature.  Nuclear stress test showed low risk and no ischemia.  (02/12/2020) Reduce metoprolol to 12.5 Continue to monitor  Essential hypertension-BP today 136/92 .  Well-controlled at  home. Continue metoprolol, HCTZ,  Stop losartan Start valsartan 320 mg daily Heart healthy low-sodium diet-salty 6 given Increase physical activity as tolerated BMP in 2 weeks  Lower extremity edema-described as left greater than right.  Encouraged to use lower extremity support stockings [place] support stockings sheet given Heart healthy low-sodium diet  Disposition: Follow-up with me in 1 month  COVID-19 Education: The signs and symptoms of COVID-19 were discussed with the patient and how to seek care for testing (follow up with PCP or arrange E-visit).  The importance of social distancing was discussed today.  Patient Risk:   After full review of this patients clinical status, I feel that they are at least moderate risk at this time.  Time:   Today, I have spent 20 minutes with the patient with telehealth technology discussing nuclear stress test, medications, diet, exercise, lower extremity swelling, and compression stockings.  Greater than 20 minutes was spent reviewing her chart, labs, stress test, and medications prior to her visit.   Medication Adjustments/Labs and Tests Ordered: Current medicines are reviewed at length with the patient today.  Concerns regarding medicines are outlined above.   Tests Ordered: Orders Placed This Encounter  Procedures  . Basic Metabolic Panel (BMET)   Medication Changes: Meds ordered this encounter  Medications  . valsartan (DIOVAN) 320 MG tablet    Sig: Take 1 tablet (320 mg total) by mouth daily.    Dispense:  30 tablet    Refill:  6  . metoprolol succinate (TOPROL XL) 25 MG 24 hr tablet    Sig: Take 0.5 tablets (12.5 mg total) by mouth at bedtime.    Dispense:  45 tablet    Refill:  3    D/C PREVIOUS RX    Disposition:  in 1 month(s)  Signed, Jossie Ng. Taite Baldassari NP-C    04/15/2019 11:58 AM    [address] Office [phone] Fax [phone]

## 2020-02-13 ENCOUNTER — Encounter: Payer: Self-pay | Admitting: General Practice

## 2020-02-13 ENCOUNTER — Ambulatory Visit: Payer: BC Managed Care – PPO | Admitting: Cardiovascular Disease

## 2020-02-13 ENCOUNTER — Telehealth (INDEPENDENT_AMBULATORY_CARE_PROVIDER_SITE_OTHER): Payer: BC Managed Care – PPO | Admitting: General Practice

## 2020-02-13 VITALS — BP 136/92 | HR 92 | Wt 203.0 lb

## 2020-02-13 DIAGNOSIS — Z79899 Other long term (current) drug therapy: Secondary | ICD-10-CM | POA: Diagnosis not present

## 2020-02-13 DIAGNOSIS — I10 Essential (primary) hypertension: Secondary | ICD-10-CM | POA: Diagnosis not present

## 2020-02-13 DIAGNOSIS — R600 Localized edema: Secondary | ICD-10-CM

## 2020-02-13 DIAGNOSIS — R072 Precordial pain: Secondary | ICD-10-CM | POA: Diagnosis not present

## 2020-02-13 DIAGNOSIS — R002 Palpitations: Secondary | ICD-10-CM

## 2020-02-13 MED ORDER — METOPROLOL SUCCINATE ER 25 MG PO TB24: 12.5000 mg | ORAL_TABLET | Freq: Every day | ORAL | 3 refills | Status: DC

## 2020-02-13 MED ORDER — VALSARTAN 320 MG PO TABS: 320.0000 mg | ORAL_TABLET | Freq: Every day | ORAL | 6 refills | Status: DC

## 2020-02-13 NOTE — Patient Instructions (Addendum)
Medication Instructions:  STOP LOSARTAN  START VALSARTAN 320MG  DAILY-*PM  DECREASE METOPROLOL 12.5MG  DAILY-MAY TAKE PM  OK TO TAKE HYDROCHLOROTHIAZIDE IN THE AM *If you need a refill on your cardiac medications before your next appointment, please call your pharmacy*  Lab Work: BMET IN 1 WEEK (ABOUT 02-20-2020)-THIS IS NOT FASTING If you have labs (blood work) drawn today and your tests are completely normal, you will receive your results only by:  [place] (if you have MyChart) OR A paper copy in the mail.  If you have any lab test that is abnormal or we need to change your treatment, we will call you to review the results. You may go to any [facility] that is convenient for you however, we do have a lab in our office that is able to assist you. You DO NOT need an appointment for our lab. The lab is open 8:00am and closes at 4:00pm. Lunch 12:45 - 1:45pm.  Special Instructions PLEASE INCREASE PHYSICAL ACTIVITY AS TOLERATED  TAKE AND LOG YOUR BLOOD PRESSURE AT DIFFERENT TIMES DAILY.  PLEASE READ AND FOLLOW SALTY 6-ATTACHED  PLEASE PURCHASE AND WEAR COMPRESSION STOCKINGS DAILY AND OFF AT BEDTIME. Compression stockings are elastic socks that squeeze the legs. They help to increase blood flow to the legs and to decrease swelling in the legs from fluid retention, and reduce the chance of developing blood clots in the lower legs. Please put on in the AM when dressing and off at night when dressing for bed.  PLEASE READ AND FOLLOW MINDFULNESS TIPS-ATTACHED  Follow-Up: Your next appointment:  1 month(s)  In Person with You may see Skeet Latch, MD -[place], [place]. 03-17-2020 @ 8:15AM  At [facility], you and your health needs are our priority.  As part of our continuing mission to provide you with exceptional heart care, we have created designated Provider Care Teams.  These Care Teams include your primary Cardiologist (physician) and Advanced Practice Providers (APPs -   Physician Assistants and Nurse Practitioners) who all work together to provide you with the care you need, when you need it.       Mindfulness-Based Stress Reduction Mindfulness-based stress reduction (MBSR) is a program that helps people learn to practice mindfulness. Mindfulness is the practice of intentionally paying attention to the present moment. It can be learned and practiced through techniques such as education, breathing exercises, meditation, and yoga. MBSR includes several mindfulness techniques in one program. MBSR works best when you understand the treatment, are willing to try new things, and can commit to spending time practicing what you learn. MBSR training may include learning about:  How your emotions, thoughts, and reactions affect your body.  New ways to respond to things that cause negative thoughts to start (triggers).  How to notice your thoughts and let go of them.  Practicing awareness of everyday things that you normally do without thinking.  The techniques and goals of different types of meditation. What are the benefits of MBSR? MBSR can have many benefits, which include helping you to:  Develop self-awareness. This refers to knowing and understanding yourself.  Learn skills and attitudes that help you to participate in your own health care.  Learn new ways to care for yourself.  Be more accepting about how things are, and let things go.  Be less judgmental and approach things with an open mind.  Be patient with yourself and trust yourself more. MBSR has also been shown to:  Reduce negative emotions, such  as depression and anxiety.  Improve memory and focus.  Change how you sense and approach pain.  Boost your body's ability to fight infections.  Help you connect better with other people.  Improve your sense of well-being. Follow these instructions at home:   Find a local in-person or online MBSR program.  Set aside some time regularly  for mindfulness practice.  Find a mindfulness practice that works best for you. This may include one or more of the following: ? Meditation. Meditation involves focusing your mind on a certain thought or activity. ? Breathing awareness exercises. These help you to stay present by focusing on your breath. ? Body scan. For this practice, you lie down and pay attention to each part of your body from head to toe. You can identify tension and soreness and intentionally relax parts of your body. ? Yoga. Yoga involves stretching and breathing, and it can improve your ability to move and be flexible. It can also provide an experience of testing your body's limits, which can help you release stress. ? Mindful eating. This way of eating involves focusing on the taste, texture, color, and smell of each bite of food. Because this slows down eating and helps you feel full sooner, it can be an important part of a weight-loss plan.  Find a podcast or recording that provides guidance for breathing awareness, body scan, or meditation exercises. You can listen to these any time when you have a free moment to rest without distractions.  Follow your treatment plan as told by your health care provider. This may include taking regular medicines and making changes to your diet or lifestyle as recommended. How to practice mindfulness To do a basic awareness exercise:  Find a comfortable place to sit.  Pay attention to the present moment. Observe your thoughts, feelings, and surroundings just as they are.  Avoid placing judgment on yourself, your feelings, or your surroundings. Make note of any judgment that comes up, and let it go.  Your mind may wander, and that is okay. Make note of when your thoughts drift, and return your attention to the present moment. To do basic mindfulness meditation:  Find a comfortable place to sit. This may include a stable chair or a firm floor cushion. ? Sit upright with your back  straight. Let your arms fall next to your side with your hands resting on your legs. ? If sitting in a chair, rest your feet flat on the floor. ? If sitting on a cushion, cross your legs in front of you.  Keep your head in a neutral position with your chin dropped slightly. Relax your jaw and rest the tip of your tongue on the roof of your mouth. Drop your gaze to the floor. You can close your eyes if you like.  Breathe normally and pay attention to your breath. Feel the air moving in and out of your nose. Feel your belly expanding and relaxing with each breath.  Your mind may wander, and that is okay. Make note of when your thoughts drift, and return your attention to your breath.  Avoid placing judgment on yourself, your feelings, or your surroundings. Make note of any judgment or feelings that come up, let them go, and bring your attention back to your breath.  When you are ready, lift your gaze or open your eyes. Pay attention to how your body feels after the meditation. Where to find more information You can find more information about MBSR from:  Your health care provider.  Community-based meditation centers or programs.  Programs offered near you. Summary  Mindfulness-based stress reduction (MBSR) is a program that teaches you how to intentionally pay attention to the present moment. It is used with other treatments to help you cope better with daily stress, emotions, and pain.  MBSR focuses on developing self-awareness, which allows you to respond to life stress without judgment or negative emotions.  MBSR programs may involve learning different mindfulness practices, such as breathing exercises, meditation, yoga, body scan, or mindful eating. Find a mindfulness practice that works best for you, and set aside time for it on a regular basis. This information is not intended to replace advice given to you by your health care provider. Make sure you discuss any questions you have  with your health care provider.

## 2020-02-17 ENCOUNTER — Other Ambulatory Visit: Payer: Self-pay

## 2020-02-17 MED ORDER — VALSARTAN 160 MG PO TABS
160.0000 mg | ORAL_TABLET | Freq: Every day | ORAL | 3 refills | Status: DC
Start: 2020-02-17 — End: 2020-02-17

## 2020-02-17 MED ORDER — VALSARTAN 160 MG PO TABS: 160.0000 mg | ORAL_TABLET | Freq: Every day | ORAL | 3 refills | Status: DC

## 2020-02-17 NOTE — Addendum Note (Signed)
Addended by: Fidel Levy on: 02/17/2020 12:53 PM   Modules accepted: Orders

## 2020-03-10 ENCOUNTER — Ambulatory Visit: Payer: BC Managed Care – PPO | Admitting: Family

## 2020-03-10 ENCOUNTER — Other Ambulatory Visit: Payer: Self-pay

## 2020-03-10 ENCOUNTER — Encounter: Payer: Self-pay | Admitting: Family

## 2020-03-10 VITALS — BP 124/80 | HR 78 | Resp 16 | Ht 65.0 in | Wt 199.0 lb

## 2020-03-10 DIAGNOSIS — I10 Essential (primary) hypertension: Secondary | ICD-10-CM

## 2020-03-10 DIAGNOSIS — G4733 Obstructive sleep apnea (adult) (pediatric): Secondary | ICD-10-CM

## 2020-03-10 DIAGNOSIS — E042 Nontoxic multinodular goiter: Secondary | ICD-10-CM

## 2020-03-10 DIAGNOSIS — E119 Type 2 diabetes mellitus without complications: Secondary | ICD-10-CM

## 2020-03-10 DIAGNOSIS — F419 Anxiety disorder, unspecified: Secondary | ICD-10-CM | POA: Diagnosis not present

## 2020-03-10 DIAGNOSIS — F32A Depression, unspecified: Secondary | ICD-10-CM

## 2020-03-10 DIAGNOSIS — R519 Headache, unspecified: Secondary | ICD-10-CM

## 2020-03-10 DIAGNOSIS — F329 Major depressive disorder, single episode, unspecified: Secondary | ICD-10-CM

## 2020-03-10 DIAGNOSIS — G8929 Other chronic pain: Secondary | ICD-10-CM

## 2020-03-10 LAB — BASIC METABOLIC PANEL
BUN: 17 mg/dL (ref 6–23)
CO2: 30 mEq/L (ref 19–32)
Calcium: 9.9 mg/dL (ref 8.4–10.5)
Chloride: 98 mEq/L (ref 96–112)
Creatinine, Ser: 0.92 mg/dL (ref 0.40–1.20)
GFR: 78.04 mL/min (ref >60.00)
Glucose, Bld: 117 mg/dL — ABNORMAL HIGH (ref 70–99)
Potassium: 4.4 mEq/L (ref 3.5–5.1)
Sodium: 138 mEq/L (ref 135–145)

## 2020-03-10 LAB — HEMOGLOBIN A1C: Hgb A1c MFr Bld: 6 % (ref 4.6–6.5)

## 2020-03-10 NOTE — Progress Notes (Signed)
Subjective:    Patient ID: Michele [name], female    DOB: [birth year], 49 y.o.   MRN: [id number]  HPI  Patient is a 49 yr old female who presents today for follow up.  DM2- diet controlled.  Wt Readings from Last 3 Encounters:  03/10/20 199 lb (90.3 kg)  02/13/20 203 lb (92.1 kg)  02/12/20 204 lb (92.5 kg)    Lab Results  Component Value Date   HGBA1C 5.9 12/03/2019   HGBA1C 5.7 09/13/2018   HGBA1C 6.0 06/17/2018   Lab Results  Component Value Date   MICROALBUR 0.9 07/04/2017   LDLCALC 139 (H) 09/13/2018   CREATININE 0.81 12/03/2019   Hypertension- maintained on diovan, metoprolol.  BP Readings from Last 3 Encounters:  03/10/20 124/80  02/13/20 (!) 136/92  01/01/20 (!) 142/70    Depression/anxiety- maintained on citalopram.  Started citalopram last week per psych.    OSA- not wearing cpap. Had trouble getting set up with pulmonology and then referral expired. Reports daily headaches- using BC powder multiple times a day.   Thyroid nodules- will see Dr. Posey Pronto in August.    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 Posey Pronto at [facility]- S/p FNA March/april of two dominant nodules- benign.  Following for annual thyroid US with Dr. Posey Pronto.     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   Tachycardia    unspecified     Social History   Socioeconomic History   Marital status: Married    Spouse name: Warehouse manager   Number of children: 2   Years of education: Not on file   Highest education level: Not on file  Occupational History   Occupation:  CLAIMS AGENT    Employer: [employer]  Tobacco Use   Smoking status: Never Smoker   Smokeless tobacco: Never Used   Tobacco comment: never used tobacco  Substance and Sexual Activity   Alcohol use: Yes    Comment: Once a month   Drug use: No   Sexual activity: Yes    Birth control/protection: None, Surgical  Other Topics Concern   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   Lives with husband, 12 year old son, and granddaughter/grandson   Regular exercise: yes   Daily caffeine: 1-2 daily         Social Determinants of Health   Financial Resource Strain:    Difficulty of Paying Living Expenses:   Food Insecurity:    Worried About Charity fundraiser in the Last Year:    Arboriculturist in the Last Year:   Transportation Needs:    Film/video editor (Medical):    Lack of Transportation (Non-Medical):   Physical Activity:    Days of Exercise per Week:    Minutes of Exercise per Session:   Stress:    Feeling of Stress :   Social Connections:    Frequency of Communication with Friends and  Family:    Frequency of Social Gatherings with Friends and Family:    Attends Religious Services:    Active Member of Clubs or Organizations:    Attends Archivist Meetings:    Marital Status:   Intimate Partner Violence:    Fear of Current or Ex-Partner:    Emotionally Abused:    Physically Abused:    Sexually Abused:     Past Surgical History:  Procedure Laterality Date   APPENDECTOMY  03/2009   BIOPSY THYROID  November 28, 2009   Dr. Posey Pronto- endo   DILATION AND CURETTAGE OF UTERUS  05/21/2011   Procedure: DILATATION AND CURETTAGE (D&C);  Surgeon: Elveria Royals;  Location: [facility];  Service: Gynecology;  Laterality: N/A;  dilitation and currettage/endometrial currettings   TUBAL LIGATION  1997   TUBOPLASTY / TUBOTUBAL ANASTOMOSIS  2005    Family History  Problem Relation Age of Onset   Hypertension Mother     Cancer Mother 70       breast   Cancer Father        colon   Stroke Brother        handicapped due to complications from spinal meningitis   Seizures Brother    Asthma Son    Arthritis Maternal Grandmother    Hypertension Maternal Grandmother    Heart attack Maternal Grandmother    Stroke Maternal Grandfather    Hypertension Maternal Uncle     Allergies  Allergen Reactions   Amlodipine Shortness Of Breath, Anxiety, Palpitations and Other (See Comments)   Bee Venom Anaphylaxis   Bupropion Shortness Of Breath, Anxiety, Palpitations and Other (See Comments)   Clonidine Derivatives     dizziness   Hydralazine     dizziness   Imitrex [Sumatriptan] Nausea Only   Oxycodone Nausea Only    Current Outpatient Medications on File Prior to Visit  Medication Sig Dispense Refill   ALPRAZolam (XANAX) 1 MG tablet Take 1 mg by mouth as needed. For anxiety     citalopram (CELEXA) 20 MG tablet Take 20 mg by mouth daily.     EPIPEN 2-PAK 0.3 MG/0.3ML SOAJ injection daily as needed (FOR ALLERGY).      hydrochlorothiazide (HYDRODIURIL) 25 MG tablet Take 1 tablet by mouth once daily 90 tablet 0   ibuprofen (ADVIL,MOTRIN) 800 MG tablet Take 800 mg by mouth 3 (three) times daily.  0   metoprolol succinate (TOPROL XL) 25 MG 24 hr tablet Take 0.5 tablets (12.5 mg total) by mouth at bedtime. 45 tablet 3   valsartan (DIOVAN) 160 MG tablet Take 1 tablet (160 mg total) by mouth daily. 90 tablet 3   [DISCONTINUED] buPROPion (WELLBUTRIN XL) 150 MG 24 hr tablet Take 2 tablets (300 mg total) by mouth daily. 60 tablet 1   No current facility-administered medications on file prior to visit.    BP 124/80 (BP Location: Right Arm, Patient Position: Sitting, Cuff Size: Large)    Pulse 78    Resp 16    Ht 5\' 5"  (1.651 m)    Wt 199 lb (90.3 kg)    LMP 06/11/2013    SpO2 98%    BMI 33.12 kg/m    Objective:   Physical Exam Constitutional:      Appearance: She is well-developed.    Neck:     Thyroid: No thyromegaly.  Cardiovascular:     Rate and Rhythm: Normal rate and regular rhythm.     Heart sounds: Normal heart sounds. No murmur heard.  Pulmonary:     Effort: Pulmonary effort is normal. No respiratory distress.     Breath sounds: Normal breath sounds. No wheezing.  Musculoskeletal:     Cervical back: Neck supple.  Skin:    General: Skin is warm and dry.  Neurological:     Mental Status: She is alert and oriented to person, place, and time.  Psychiatric:        Behavior: Behavior normal.        Thought Content: Thought content normal.        Judgment: Judgment normal.           Assessment & Plan:  HTN- bp stable on current regimen. She states that she is only able to tolerate the diovan QOD. This is being prescribed by cardiology and plans to discuss dosing with them at her upcoming appointment.  OSA- untreated. Having HA/Fatigue/Joint pain- I suspect that untreated OSA is likely a contributing factor to all of these symptoms. Will re-initiate referral back to pulmonology for cpap management.  HA's- advised pt to d/c BC powder.  Instead may use tylenol sparingly. Discussed that she needs to get OSA treated.   Thyroid nodules- has consult scheduled with endo.  Depression/anxiety- being treated by psychiatry.  DM2- obtain A1C.  I commended her on her recent weight loss.   This visit occurred during the SARS-CoV-2 public health emergency.  Safety protocols were in place, including screening questions prior to the visit, additional usage of staff PPE, and extensive cleaning of exam room while observing appropriate contact time as indicated for disinfecting solutions.

## 2020-03-10 NOTE — Patient Instructions (Addendum)
Please complete lab work prior to leaving. You should be contacted about scheduling your appointment with Dr. Elsworth Soho.

## 2020-03-17 ENCOUNTER — Ambulatory Visit: Payer: BC Managed Care – PPO | Admitting: General Practice

## 2020-04-03 DIAGNOSIS — F410 Panic disorder [episodic paroxysmal anxiety] without agoraphobia: Secondary | ICD-10-CM | POA: Diagnosis not present

## 2020-04-03 DIAGNOSIS — F331 Major depressive disorder, recurrent, moderate: Secondary | ICD-10-CM | POA: Diagnosis not present

## 2020-04-12 NOTE — Progress Notes (Deleted)
Cardiology Clinic Note   Patient Name: Michele [name] Date of Encounter: 04/12/2020  Primary Care Provider:  Debbrah Alar, NP Primary Cardiologist:  Skeet Latch, MD  Patient Profile    Michele [name] is a 49 y.o. female presents for follow-up evaluation of her hypertension.  Past Medical History    Past Medical History:  Diagnosis Date  . Anemia    iron deficient, microcytic, hypochromic  . Anxiety   . Chest pain    atypical  . Depression   . Diabetes mellitus without complication ([place])   . Fatigue   . Fatty liver 08/15/2013  . Fibroids    uterine  . Hypertension   . Migraines   . 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 Posey Pronto at [facility]- S/p FNA March/april of two dominant nodules- benign.  Following for annual thyroid US with Dr. Posey Pronto.    . Palpitations    recurrent  . 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 Tachycardia    unspecified   Past Surgical History:  Procedure Laterality Date  . APPENDECTOMY  03/2009  . BIOPSY THYROID  November 28, 2009   Dr. Posey Pronto- endo  . DILATION AND CURETTAGE OF UTERUS  05/21/2011   Procedure: DILATATION AND CURETTAGE (D&C);  Surgeon: Elveria Royals;  Location: [facility];  Service: Gynecology;  Laterality: N/A;  dilitation and currettage/endometrial currettings  . TUBAL LIGATION  1997  . TUBOPLASTY / TUBOTUBAL ANASTOMOSIS  2005    Allergies  Allergies  Allergen Reactions  . Amlodipine Shortness Of Breath, Anxiety, Palpitations and Other (See Comments)  . Bee Venom Anaphylaxis  . Bupropion Shortness Of Breath, Anxiety, Palpitations and Other (See Comments)  . Clonidine Derivatives     dizziness  . Hydralazine     dizziness  . Imitrex [Sumatriptan] Nausea Only  . Oxycodone Nausea Only    History of Present Illness    Michele [name]  has a PMH of hypertension, palpitations, chronic chest pain, and obstructive sleep apnea.  She was initially seen on 3/21 for an evaluation of  her chest discomfort.  She reported having chest pain for years.  She indicated that it felt like her chest would get tight and her heart would start racing.  It occurred when she was sitting at her desk.  She could also feel her heartbeat in her ears.  Sometimes the heart pounding with wake her up from sleep.  She also noted that if she tried to do any physical activity her heart would start to race.  She had started exercising regularly but had not seen improvement in her symptoms or endurance.  She is riding a Peloton but was unable to complete bouts of exercise due to her high heart rate.  She previously underwent an evaluation for OSA and was told she had mild sleep apnea.  She was told that she did not require a CPAP device.  She indicated that she had gained weight since her previous sleep evaluation.  Her blood pressure at home was well controlled.  She also indicated that in the past she was told that her chest pain was related to anxiety.  She indicated that she had taken Xanax for her anxiety and it did not help her chest discomfort.  So, she did not believe her chest discomfort was related to anxiety states.  She underwent cardiac catheterization in 2013 which showed normal coronary arteries.  She was seen by Dr. Burt Knack in 2014 for frequent palpitations.  An echocardiogram 12/14 showed an LVEF of 50-55%, mild mitral regurgitation.  At that time she was offered metoprolol but deferred use.  She wore a 7-day event monitor that showed no arrhythmias.  Her average heart rate was 103 bpm.  She only wore her heart rate monitor for 3 hours due to anxiousness.  She underwent coronary CTA that could not be completed due to increased heart rate despite metoprolol,ibavardine, and Xanax.  She was last seen by Dr. Oval Linsey on 01/01/2020.  During that time she continued to have chest pain and her heart rate continued to be elevated.  She reported shortness of breath and leg pain.  Her blood pressure was also  elevated when she went for cardiac CT.  She indicated that she had been waking up in the middle of the night with a sensation of burning in her lungs.  She was referred to pulmonary.  She admitted to struggling with anxiety in the past.  She did not tolerate Prozac or Cymbalta and was not interested in trying new medical therapy.  She was seen for further evaluation 02/13/20 virtually and stated she did not feel well during her stress test on 2020-02-12.  She felt that her heart rate was too high.  I spoke with her about her elevated blood pressure and we  switched her losartan to valsartan.  She also indicated that her energy level had been greatly decreased since starting metoprolol.  I decrease it to 12.5 daily.   Her low-sodium diet was discussed and continued.  I  instructed her to increase her physical activity slowly.  We planned to check a BMP in 2 weeks and plannedher follow-up with me in 1 month.  She also indicated that she had some lower extremity swelling.  I gave her the [place] elastics handout.  She presents to the clinic today for follow-up evaluation and states***  Today she denies increased chest pain, shortness of breath, increased lower extremity edema, fatigue, palpitations, melena, hematuria, hemoptysis, diaphoresis, weakness, presyncope, syncope, orthopnea, and PND. Home Medications    Prior to Admission medications   Medication Sig Start Date End Date Taking? Authorizing Provider  ALPRAZolam Duanne Moron) 1 MG tablet Take 1 mg by mouth as needed. For anxiety    [provider]  citalopram (CELEXA) 20 MG tablet Take 20 mg by mouth daily. 03/02/20   [provider]  EPIPEN 2-PAK 0.3 MG/0.3ML SOAJ injection daily as needed (FOR ALLERGY).  05/14/13   [provider]  hydrochlorothiazide (HYDRODIURIL) 25 MG tablet Take 1 tablet by mouth once daily 01/28/20   Debbrah Alar, NP  ibuprofen (ADVIL,MOTRIN) 800 MG tablet Take 800 mg by mouth 3 (three) times daily.  05/20/15   [provider]  metoprolol succinate (TOPROL XL) 25 MG 24 hr tablet Take 0.5 tablets (12.5 mg total) by mouth at bedtime. 02/13/20   Deberah Pelton, NP  valsartan (DIOVAN) 160 MG tablet Take 1 tablet (160 mg total) by mouth daily. 02/17/20   Deberah Pelton, NP  buPROPion (WELLBUTRIN XL) 150 MG 24 hr tablet Take 2 tablets (300 mg total) by mouth daily. 10/13/11 11/28/11  Debbrah Alar, NP    Family History    Family History  Problem Relation Age of Onset  . Hypertension Mother   . Cancer Mother 56       breast  . Cancer Father        colon  . Stroke Brother        handicapped due to complications  from spinal meningitis  . Seizures Brother   . Asthma Son   . Arthritis Maternal Grandmother   . Hypertension Maternal Grandmother   . Heart attack Maternal Grandmother   . Stroke Maternal Grandfather   . Hypertension Maternal Uncle    She indicated that her mother is alive. She indicated that her father is deceased. She indicated that both of her brothers are alive. She indicated that her maternal grandmother is alive. She indicated that her maternal grandfather is deceased. She indicated that both of her sons are alive. She indicated that the status of her maternal uncle is unknown. She indicated that her grandchild is alive.  Social History    Social History   Socioeconomic History  . Marital status: Married    Spouse name: [name]  . Number of children: 2  . Years of education: Not on file  . Highest education level: Not on file  Occupational History  . Occupation: CLAIMS AGENT    Employer: [employer]  Tobacco Use  . Smoking status: Never Smoker  . Smokeless tobacco: Never Used  . Tobacco comment: never used tobacco  Substance and Sexual Activity  . Alcohol use: Yes    Comment: Once a month  . Drug use: No  . Sexual activity: Yes    Birth control/protection: None, Surgical  Other Topics Concern  . Not on file  Social History Narrative   Works  for [employer] in insurance division   Lives with husband, 92 year old son, and granddaughter/grandson   Regular exercise: yes   Daily caffeine: 1-2 daily         Social Determinants of Health   Financial Resource Strain:   . Difficulty of Paying Living Expenses:   Food Insecurity:   . Worried About Charity fundraiser in the Last Year:   . Arboriculturist in the Last Year:   Transportation Needs:   . Film/video editor (Medical):   Marland Kitchen Lack of Transportation (Non-Medical):   Physical Activity:   . Days of Exercise per Week:   . Minutes of Exercise per Session:   Stress:   . Feeling of Stress :   Social Connections:   . Frequency of Communication with Friends and Family:   . Frequency of Social Gatherings with Friends and Family:   . Attends Religious Services:   . Active Member of Clubs or Organizations:   . Attends Archivist Meetings:   Marland Kitchen Marital Status:   Intimate Partner Violence:   . Fear of Current or Ex-Partner:   . Emotionally Abused:   Marland Kitchen Physically Abused:   . Sexually Abused:      Review of Systems    General:  No chills, fever, night sweats or weight changes.  Cardiovascular:  No chest pain, dyspnea on exertion, edema, orthopnea, palpitations, paroxysmal nocturnal dyspnea. Dermatological: No rash, lesions/masses Respiratory: No cough, dyspnea Urologic: No hematuria, dysuria Abdominal:   No nausea, vomiting, diarrhea, bright red blood per rectum, melena, or hematemesis Neurologic:  No visual changes, wkns, changes in mental status. All other systems reviewed and are otherwise negative except as noted above.  Physical Exam    VS:  LMP 06/11/2013  , BMI There is no height or weight on file to calculate BMI. GEN: Well nourished, well developed, in no acute distress. HEENT: normal. Neck: Supple, no JVD, carotid bruits, or masses. Cardiac: RRR, no murmurs, rubs, or gallops. No clubbing, cyanosis, edema.  Radials/DP/PT 2+ and equal bilaterally.  Respiratory:  Respirations regular and unlabored, clear to auscultation bilaterally. GI: Soft, nontender, nondistended, BS + x 4. MS: no deformity or atrophy. Skin: warm and dry, no rash. Neuro:  Strength and sensation are intact. Psych: Normal affect.  Accessory Clinical Findings    Recent Labs: 06/24/2019: ALT 18; B Natriuretic Peptide 9.5 11/29/2019: TSH 2.041 12/03/2019: Hemoglobin 14.1; Platelets 228.0 03/10/2020: BUN 17; Creatinine, Ser 0.92; Potassium 4.4; Sodium 138   Recent Lipid Panel    Component Value Date/Time   CHOL 195 09/13/2018 1017   TRIG 71.0 09/13/2018 1017   HDL 42.20 09/13/2018 1017   CHOLHDL 5 09/13/2018 1017   VLDL 14.2 09/13/2018 1017   LDLCALC 139 (H) 09/13/2018 1017    ECG personally reviewed by me today- *** - No acute changes  EKG 12/05/2019 Sinus rhythm 97 bpm  EKG 01/01/2020 Sinus rhythm 91 bpm nonspecific ST changes  Cardiac event monitor 12/2019 Quality: Fair. Baseline artifact. Predominant rhythm: sinus tachycardia Average heart rate: 103 bpm Max heart rate: 172 bpm Min heart rate: 76 bpm  No arrhythmias  Echocardiogram 12/14 Study Conclusions   - Left ventricle: The cavity size was normal. Wall thickness  was normal. Systolic function was normal. The estimated  ejection fraction was in the range of 50% to 55%. Wall  motion was normal; there were no regional wall motion  abnormalities. Left ventricular diastolic function  parameters were normal.  - Mitral valve: Mild regurgitation.  - Atrial septum: No defect or patent foramen ovale was  identified.   Renal artery Doppler 12/25/11:normal renal arteries  Echocardiogram 01/21/2020 IMPRESSIONS    1. Left ventricular ejection fraction, by estimation, is 60 to 65%. The  left ventricle has normal function. The left ventricle has no regional  wall motion abnormalities. There is mild left ventricular hypertrophy.  Left ventricular diastolic parameters  are  consistent with Grade I diastolic dysfunction (impaired relaxation).  2. Right ventricular systolic function is hyperdynamic. The right  ventricular size is normal. Tricuspid regurgitation signal is inadequate  for assessing PA pressure.  3. The mitral valve is grossly normal. Trivial mitral valve  regurgitation.  4. The aortic valve is tricuspid. Aortic valve regurgitation is not  visualized.  5. The inferior vena cava is normal in size with greater than 50%  respiratory variability, suggesting right atrial pressure of 3 mmHg.  Nuclear stress test 02/12/2020 The left ventricular ejection fraction is hyperdynamic (>65%).   Nuclear stress EF: 66%.   Blood pressure demonstrated a hypertensive response to exercise.   There was no ST segment deviation noted during stress.   The study is normal.   This is a low risk study.    Normal stress nuclear study with no ischemia or infarction. Gated  ejection fraction 66% with normal wall motion.   Assessment & Plan   1.  Essential hypertension-BP today***.  Valsartan 320 was originally prescribed and the patient went on vacation.  With the higher dose she experienced dizziness, lightheadedness, and stumbling with walking.  Her valsartan was decreased to 160 mg daily.  Better control at home with addition of valsartan.  Follow-up BMP stable. Continue metoprolol, HCTZ, valsartan Heart healthy low-sodium diet-salty 6 given Increase physical activity as tolerated Keep blood pressure log  Chest pain-chronic, continues to be present.  Underwent nuclear stress test 02/12/2020 which showed low risk and no ischemia. Continue metoprolol Heart healthy low-sodium diet-salty 6 given Increase physical activity as tolerated  Tachycardia-heart rate today***.  Have been less frequent.  Previously prescribed cardiac  event monitor but was only able to wear for 3 hours due to anxiety.  It was recommended that she follow-up with her PCP for anxiety  treatment.  Mindfulness stress reduction sheet again discussed. Continue metoprolol Avoid triggers caffeine, chocolate, EtOH etc. Heart healthy low-sodium diet-salty 6 given Increase physical activity as tolerated  Bilateral lower extremity edema-previously described left greater than right and was encouraged to use lower extremity support stockings. Elevate lower extremities when not active Continue to use lower extremity support stockings Heart healthy low-sodium diet  Disposition: Follow-up with Dr. Oval Linsey in 3 months.  Jossie Ng. Aleeya Veitch NP-C    04/12/2020, 4:39 PM [facility] [place] [address] Office [phone] Fax [phone]  Notice: This dictation was prepared with Dragon dictation along with smaller phrase technology. Any transcriptional errors that result from this process are unintentional and may not be corrected upon review.

## 2020-04-13 ENCOUNTER — Ambulatory Visit: Payer: BC Managed Care – PPO | Admitting: General Practice

## 2020-05-06 ENCOUNTER — Other Ambulatory Visit: Payer: Self-pay | Admitting: Family

## 2020-05-07 DIAGNOSIS — Z20828 Contact with and (suspected) exposure to other viral communicable diseases: Secondary | ICD-10-CM | POA: Diagnosis not present

## 2020-06-09 DIAGNOSIS — G43719 Chronic migraine without aura, intractable, without status migrainosus: Secondary | ICD-10-CM | POA: Diagnosis not present

## 2020-06-09 DIAGNOSIS — M5417 Radiculopathy, lumbosacral region: Secondary | ICD-10-CM | POA: Diagnosis not present

## 2020-06-09 DIAGNOSIS — R202 Paresthesia of skin: Secondary | ICD-10-CM | POA: Diagnosis not present

## 2020-06-09 DIAGNOSIS — M5412 Radiculopathy, cervical region: Secondary | ICD-10-CM | POA: Diagnosis not present

## 2020-06-09 DIAGNOSIS — G5603 Carpal tunnel syndrome, bilateral upper limbs: Secondary | ICD-10-CM | POA: Diagnosis not present

## 2020-06-09 DIAGNOSIS — G510 Bell's palsy: Secondary | ICD-10-CM | POA: Diagnosis not present

## 2020-06-09 DIAGNOSIS — R42 Dizziness and giddiness: Secondary | ICD-10-CM | POA: Diagnosis not present

## 2020-06-30 ENCOUNTER — Institutional Professional Consult (permissible substitution): Payer: BC Managed Care – PPO | Admitting: Pulmonary Disease

## 2020-07-13 DIAGNOSIS — N898 Other specified noninflammatory disorders of vagina: Secondary | ICD-10-CM | POA: Diagnosis not present

## 2020-07-13 DIAGNOSIS — Z6833 Body mass index (BMI) 33.0-33.9, adult: Secondary | ICD-10-CM | POA: Diagnosis not present

## 2020-07-13 DIAGNOSIS — Z800 Family history of malignant neoplasm of digestive organs: Secondary | ICD-10-CM | POA: Diagnosis not present

## 2020-07-13 DIAGNOSIS — Z01419 Encounter for gynecological examination (general) (routine) without abnormal findings: Secondary | ICD-10-CM | POA: Diagnosis not present

## 2020-07-13 DIAGNOSIS — Z803 Family history of malignant neoplasm of breast: Secondary | ICD-10-CM | POA: Diagnosis not present

## 2020-07-13 DIAGNOSIS — Z1231 Encounter for screening mammogram for malignant neoplasm of breast: Secondary | ICD-10-CM | POA: Diagnosis not present

## 2020-07-13 LAB — HM MAMMOGRAPHY

## 2020-07-20 ENCOUNTER — Encounter: Payer: Self-pay | Admitting: Family

## 2020-07-20 MED ORDER — VALACYCLOVIR HCL 1 G PO TABS
ORAL_TABLET | ORAL | 3 refills | Status: DC
Start: 2020-07-20 — End: 2020-11-06

## 2020-07-30 ENCOUNTER — Institutional Professional Consult (permissible substitution): Payer: BC Managed Care – PPO | Admitting: Pulmonary Disease

## 2020-08-10 ENCOUNTER — Other Ambulatory Visit: Payer: Self-pay | Admitting: Family

## 2020-08-26 ENCOUNTER — Institutional Professional Consult (permissible substitution): Payer: BC Managed Care – PPO | Admitting: Pulmonary Disease

## 2020-08-31 ENCOUNTER — Ambulatory Visit: Payer: BC Managed Care – PPO | Admitting: Family

## 2020-09-02 ENCOUNTER — Telehealth: Payer: BC Managed Care – PPO | Admitting: Family Medicine

## 2020-09-02 DIAGNOSIS — J101 Influenza due to other identified influenza virus with other respiratory manifestations: Secondary | ICD-10-CM | POA: Diagnosis not present

## 2020-09-17 ENCOUNTER — Other Ambulatory Visit: Payer: Self-pay | Admitting: Family

## 2020-09-20 ENCOUNTER — Ambulatory Visit: Payer: BC Managed Care – PPO | Admitting: Family

## 2020-09-21 ENCOUNTER — Ambulatory Visit: Payer: BC Managed Care – PPO | Admitting: Family

## 2020-09-21 ENCOUNTER — Other Ambulatory Visit: Payer: Self-pay

## 2020-09-21 ENCOUNTER — Telehealth: Payer: Self-pay | Admitting: Family

## 2020-09-21 VITALS — BP 141/88 | HR 99 | Temp 98.1°F | Resp 16 | Ht 65.0 in | Wt 190.0 lb

## 2020-09-21 DIAGNOSIS — E785 Hyperlipidemia, unspecified: Secondary | ICD-10-CM

## 2020-09-21 DIAGNOSIS — I10 Essential (primary) hypertension: Secondary | ICD-10-CM

## 2020-09-21 DIAGNOSIS — E042 Nontoxic multinodular goiter: Secondary | ICD-10-CM

## 2020-09-21 DIAGNOSIS — E119 Type 2 diabetes mellitus without complications: Secondary | ICD-10-CM | POA: Diagnosis not present

## 2020-09-21 DIAGNOSIS — Z1159 Encounter for screening for other viral diseases: Secondary | ICD-10-CM

## 2020-09-21 DIAGNOSIS — Z23 Encounter for immunization: Secondary | ICD-10-CM | POA: Diagnosis not present

## 2020-09-21 DIAGNOSIS — D508 Other iron deficiency anemias: Secondary | ICD-10-CM | POA: Diagnosis not present

## 2020-09-21 NOTE — Telephone Encounter (Signed)
Please call [facility] and request copy of DM eye report.

## 2020-09-21 NOTE — Telephone Encounter (Signed)
Eye exam report requested

## 2020-09-21 NOTE — Progress Notes (Signed)
Subjective:    Patient ID: Michele [name], female    DOB: [birth year], 50 y.o.   MRN: [id number]  HPI   Patient is a 50 yr old female who presents today with multiple concerns.  Had the Flu 12/22. Would like flu shot today.   Fatigue, hair loss, hair is more coarse, dry skin. Reports low hr in the low 40's. Dizziness, sob.    Thyroid nodules- she did not see endocrinology as we discussed. Had difficulties scheduling.    HTN- maintained on diovan 160mg , hctz 25mg .  Not taking toprol xl. States cardiology told her to stop due to her bradycardia. BP Readings from Last 3 Encounters:  09/21/20 (!) 141/88  03/10/20 124/80  02/13/20 (!) 136/92   Lab Results  Component Value Date   TSH 2.041 11/29/2019   Reports that she does not pick up her feet because she is so tired. Falling down the steps, running into walls.  Falling down 2-3 times a week.   Hyperlipidemia- not on statin.   Lab Results  Component Value Date   CHOL 195 09/13/2018   HDL 42.20 09/13/2018   LDLCALC 139 (H) 09/13/2018   TRIG 71.0 09/13/2018   CHOLHDL 5 09/13/2018    Review of Systems See HPI  Past Medical History:  Diagnosis Date  . Anemia    iron deficient, microcytic, hypochromic  . Anxiety   . Chest pain    atypical  . Depression   . Diabetes mellitus without complication ([place])   . Fatigue   . Fatty liver 08/15/2013  . Fibroids    uterine  . Hypertension   . Migraines   . 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 Posey Pronto at [facility] S/p FNA March/april of two dominant nodules- benign.  Following for annual thyroid US with Dr. Posey Pronto.    . Palpitations    recurrent  . 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 Tachycardia    unspecified     Social History   Socioeconomic History  . Marital status: Married    Spouse name: [name]  . Number of children: 2  . Years of education: Not on file  . Highest education level: Not on file  Occupational History  .  Occupation: CLAIMS AGENT    Employer: [employer]  Tobacco Use  . Smoking status: Never Smoker  . Smokeless tobacco: Never Used  . Tobacco comment: never used tobacco  Substance and Sexual Activity  . Alcohol use: Yes    Comment: Once a month  . Drug use: No  . Sexual activity: Yes    Birth control/protection: None, Surgical  Other Topics Concern  . 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 APPENDECTOMY  03/2009  . BIOPSY THYROID  November 28, 2009   Dr. Posey Pronto- endo  . DILATION AND CURETTAGE OF UTERUS  05/21/2011   Procedure: DILATATION AND CURETTAGE (D&C);  Surgeon: Elveria Royals;  Location: [facility];  Service: Gynecology;  Laterality: N/A;  dilitation and currettage/endometrial currettings  . TUBAL LIGATION  1997  . TUBOPLASTY / TUBOTUBAL ANASTOMOSIS  2005    Family History  Problem Relation Age of Onset  . Hypertension Mother   . Cancer Mother 31       breast  . Cancer Father        colon  . Stroke Brother        handicapped due to complications from spinal meningitis  . Seizures Brother   . Asthma Son   . Arthritis Maternal Grandmother   . Hypertension Maternal Grandmother   . Heart attack Maternal Grandmother   . Stroke Maternal Grandfather   . Hypertension Maternal Uncle     Allergies  Allergen Reactions  . Amlodipine Shortness Of Breath, Anxiety, Palpitations and Other (See Comments)  . Bee Venom Anaphylaxis  . Bupropion Shortness Of Breath, Anxiety, Palpitations and Other (See Comments)   . Clonidine Derivatives     dizziness  . Hydralazine     dizziness  . Imitrex [Sumatriptan] Nausea Only  . Oxycodone Nausea Only    Current Outpatient Medications on File Prior to Visit  Medication Sig Dispense Refill  . ALPRAZolam (XANAX) 1 MG tablet Take 1 mg by mouth as needed. For anxiety    . citalopram (CELEXA) 20 MG tablet Take 20 mg by mouth daily.    Marland Kitchen EPIPEN 2-PAK 0.3 MG/0.3ML SOAJ injection daily as needed (FOR ALLERGY).     . hydrochlorothiazide (HYDRODIURIL) 25 MG tablet Take 1 tablet (25 mg total) by mouth daily. 30 tablet 0  . ibuprofen (ADVIL,MOTRIN) 800 MG tablet Take 800 mg by mouth 3 (three) times daily.  0  . valACYclovir (VALTREX) 1000 MG tablet Take 2 tabs by mouth now and again in 12 hrs 4 tablet 3  . valsartan (DIOVAN) 160 MG tablet Take 1 tablet (160 mg total) by mouth daily. 90 tablet 3  . AJOVY 225 MG/1.5ML SOAJ Inject 225 mg into the skin every 30 (thirty) days.    . UBRELVY 100 MG TABS TAKE 1 TABLET AT HEADACHE ONSET. MAY REPEAT IN 2 HORUS IF NEEDED. MAX 2 IN 24 HOURS    . [DISCONTINUED] buPROPion (WELLBUTRIN XL) 150 MG 24 hr tablet Take 2 tablets (300 mg total) by mouth daily. 60 tablet 1   No current facility-administered medications on file prior to visit.    BP (!) 141/88 (BP Location: Left Arm, Patient Position: Sitting, Cuff Size: Small)   Pulse 99   Temp 98.1 F (36.7 C) (Oral)   Resp 16   Ht 5\' 5"  (1.651 m)   Wt 190 lb (86.2 kg)   LMP 06/11/2013   SpO2 99%   BMI 31.62 kg/m       Objective:   Physical Exam Constitutional:      Appearance: She is well-developed and well-nourished.     Comments: Tired appearing  Cardiovascular:     Rate and Rhythm: Normal rate and regular rhythm.     Heart sounds: Normal heart sounds. No murmur heard.   Pulmonary:     Effort: Pulmonary effort is normal. No respiratory distress.     Breath sounds: Normal breath sounds. No wheezing.  Neurological:     Mental Status: She is alert and oriented to  person, place, and time.     Motor: No weakness.     Comments: Bilateral UE strength is 5/5  Psychiatric:        Mood and  Affect: Mood and affect normal.        Behavior: Behavior normal.        Thought Content: Thought content normal.        Judgment: Judgment normal.           Assessment & Plan:  Flu shot today  Fatigue- check TFT's, CBC  Depression- following with psychiatry, fair control.   Multiple thyroid nodules- needs Endo consult and biopsy. I will refer her to endo here in our office which should be easier for her.    Falls- I am concerned about the possibility of MS as an underlying cause.   She had an MRI/MRA done back in 2019 which noted the following:    Scattered nonspecific cerebral white matter changes, mild for age. Differential considerations include sequelae of chronic small vessel ischemic disease, complicated migraine, vasculitis, or prior infectious or inflammatory process. Demyelinating disease could also be considered, although appearance of these foci not be classic for this.  She is followed by neurology and I will reach out to them about my concerns. Also, she is advised to schedule a follow up appointment with her neurologist to further discuss.   DM2- obtain A1C.  Hyperlipidemia- obtain follow up lipid panel.  Pt has a GI specialist and is advised to schedule a routine colonoscopy.  HTN- bp is acceptable. Continue diovan 160mg , hctz 25mg . Check CMET.   This visit occurred during the SARS-CoV-2 public health emergency.  Safety protocols were in place, including screening questions prior to the visit, additional usage of staff PPE, and extensive cleaning of exam room while observing appropriate contact time as indicated for disinfecting solutions.

## 2020-09-22 LAB — CBC WITH DIFFERENTIAL/PLATELET
Basophils Absolute: 0.1 10*3/uL (ref 0.0–0.1)
Basophils Relative: 1 % (ref 0.0–3.0)
Eosinophils Absolute: 0.1 10*3/uL (ref 0.0–0.7)
Eosinophils Relative: 1.1 % (ref 0.0–5.0)
HCT: 41.8 % (ref 36.0–46.0)
Hemoglobin: 14 g/dL (ref 12.0–15.0)
Lymphocytes Relative: 38.3 % (ref 12.0–46.0)
Lymphs Abs: 2.5 10*3/uL (ref 0.7–4.0)
MCHC: 33.4 g/dL (ref 30.0–36.0)
MCV: 85.5 fl (ref 78.0–100.0)
Monocytes Absolute: 0.5 10*3/uL (ref 0.1–1.0)
Monocytes Relative: 7 % (ref 3.0–12.0)
Neutro Abs: 3.5 10*3/uL (ref 1.4–7.7)
Neutrophils Relative %: 52.6 % (ref 43.0–77.0)
Platelets: 194 10*3/uL (ref 150.0–400.0)
RBC: 4.89 Mil/uL (ref 3.87–5.11)
RDW: 13.6 % (ref 11.5–15.5)
WBC: 6.6 10*3/uL (ref 4.0–10.5)

## 2020-09-22 LAB — LIPID PANEL
Cholesterol: 201 mg/dL — ABNORMAL HIGH (ref 0–200)
HDL: 39.3 mg/dL (ref >39.00)
LDL Cholesterol: 139 mg/dL — ABNORMAL HIGH (ref 0–99)
NonHDL: 161.96
Total CHOL/HDL Ratio: 5
Triglycerides: 113 mg/dL (ref 0.0–149.0)
VLDL: 22.6 mg/dL (ref 0.0–40.0)

## 2020-09-22 LAB — COMPREHENSIVE METABOLIC PANEL
ALT: 18 U/L (ref 0–35)
AST: 18 U/L (ref 0–37)
Albumin: 4.7 g/dL (ref 3.5–5.2)
Alkaline Phosphatase: 62 U/L (ref 39–117)
BUN: 14 mg/dL (ref 6–23)
CO2: 31 mEq/L (ref 19–32)
Calcium: 9.8 mg/dL (ref 8.4–10.5)
Chloride: 101 mEq/L (ref 96–112)
Creatinine, Ser: 0.85 mg/dL (ref 0.40–1.20)
GFR: 79.56 mL/min (ref >60.00)
Glucose, Bld: 92 mg/dL (ref 70–99)
Potassium: 4.1 mEq/L (ref 3.5–5.1)
Sodium: 138 mEq/L (ref 135–145)
Total Bilirubin: 0.9 mg/dL (ref 0.2–1.2)
Total Protein: 7.5 g/dL (ref 6.0–8.3)

## 2020-09-22 LAB — T4, FREE: Free T4: 0.76 ng/dL (ref 0.60–1.60)

## 2020-09-22 LAB — T3, FREE: T3, Free: 3.2 pg/mL (ref 2.3–4.2)

## 2020-09-22 LAB — HEPATITIS C ANTIBODY
Hepatitis C Ab: NONREACTIVE
SIGNAL TO CUT-OFF: 0.01 (ref <1.00)

## 2020-09-22 LAB — HEMOGLOBIN A1C: Hgb A1c MFr Bld: 5.6 % (ref 4.6–6.5)

## 2020-09-22 LAB — IRON: Iron: 47 ug/dL (ref 42–145)

## 2020-09-22 LAB — TSH: TSH: 1.13 u[IU]/mL (ref 0.35–4.50)

## 2020-09-27 ENCOUNTER — Telehealth: Payer: Self-pay | Admitting: Family

## 2020-09-27 ENCOUNTER — Encounter: Payer: Self-pay | Admitting: Family

## 2020-09-27 NOTE — Telephone Encounter (Signed)
Please fax copy of letter I wrote to her neurologist- Dr. Trula Ore.

## 2020-09-27 NOTE — Telephone Encounter (Signed)
Letter faxed.

## 2020-09-29 ENCOUNTER — Telehealth: Payer: Self-pay | Admitting: Family

## 2020-09-29 NOTE — Telephone Encounter (Signed)
See mychart.  

## 2020-10-04 DIAGNOSIS — F331 Major depressive disorder, recurrent, moderate: Secondary | ICD-10-CM | POA: Diagnosis not present

## 2020-10-04 DIAGNOSIS — F410 Panic disorder [episodic paroxysmal anxiety] without agoraphobia: Secondary | ICD-10-CM | POA: Diagnosis not present

## 2020-10-08 IMAGING — CT CT ANGIO CHEST
2 of 8 series · 19 of 36 positions shown · IV contrast (Omnipaque)
Comparison: 06/24/2019

CLINICAL DATA: Chest pain, tachycardia

EXAM:
CT ANGIOGRAPHY CHEST WITH CONTRAST
TECHNIQUE: Multidetector CT imaging of the chest was performed using the
standard protocol during bolus administration of intravenous
contrast. Multiplanar CT image reconstructions and MIPs were
obtained to evaluate the vascular anatomy.
CONTRAST:  100mL OMNIPAQUE IOHEXOL 350 MG/ML SOLN

[Series 6: pe coronal mpr · coronal · 0.56mm/px · 1 of 138 slices shown]
[im 69/138  mediastinal]
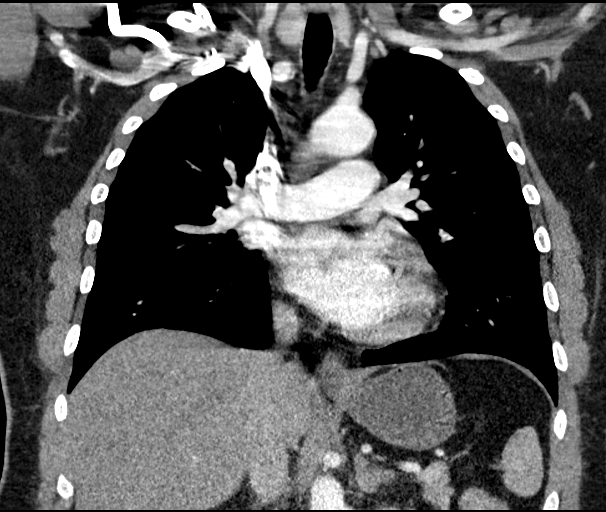

[Series 10: pe thins · axial · 0.72mm/px · z∈[-17,+238]mm · 18 of 285 slices shown]
[im 15/285  lung]
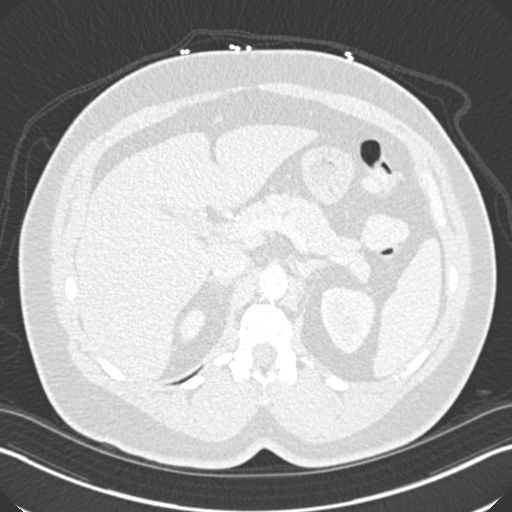
[im 30/285  mediastinal]
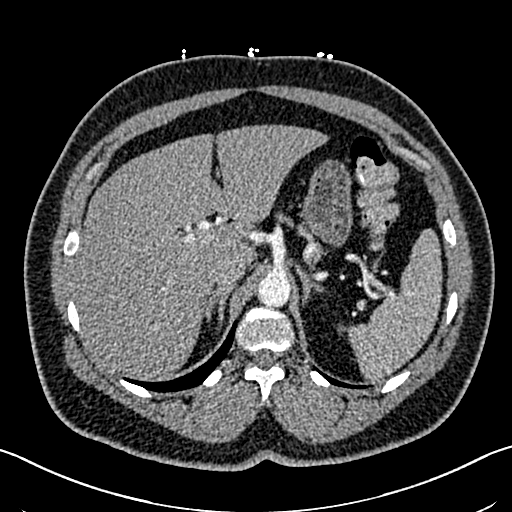
[im 45/285  lung]
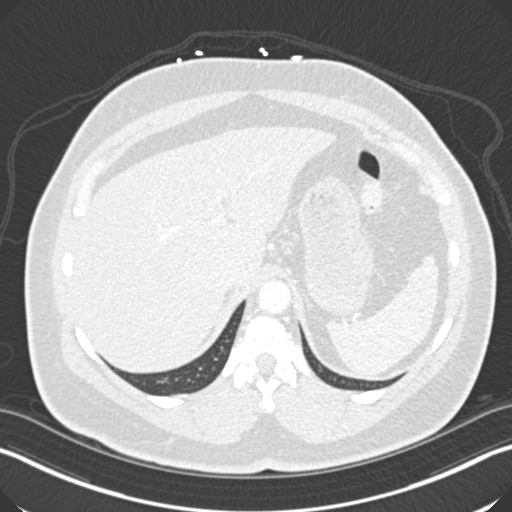
[im 60/285  mediastinal]
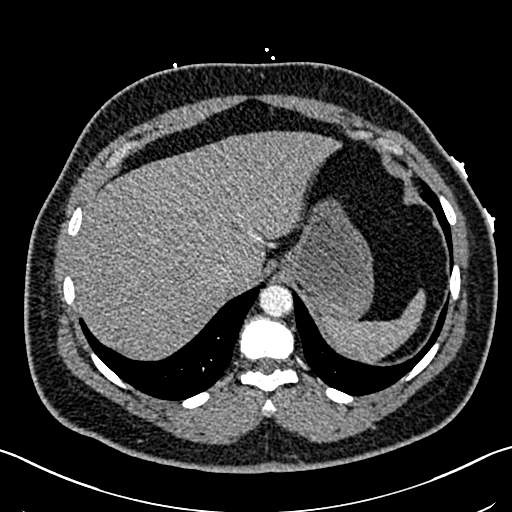
[im 75/285  lung]
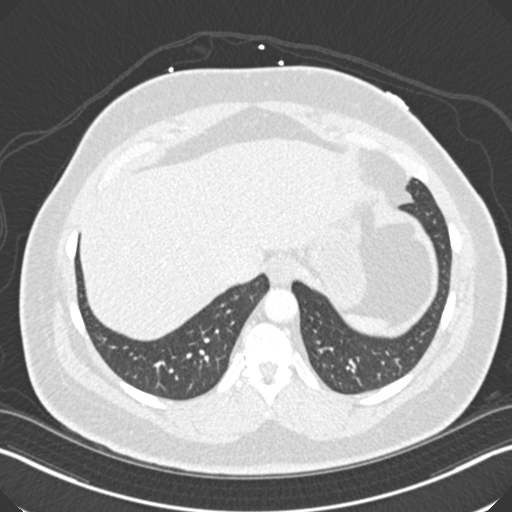
[im 90/285  mediastinal]
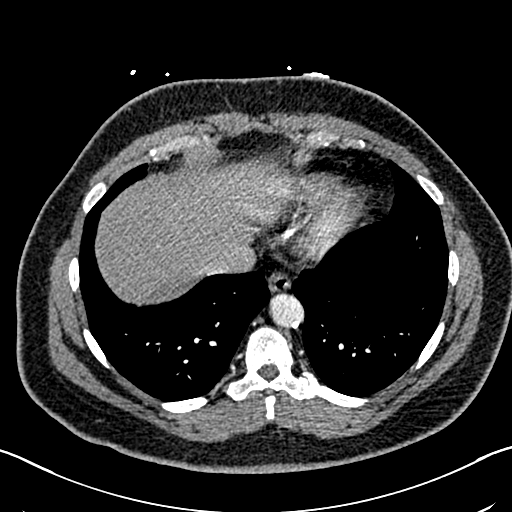
[im 105/285  lung]
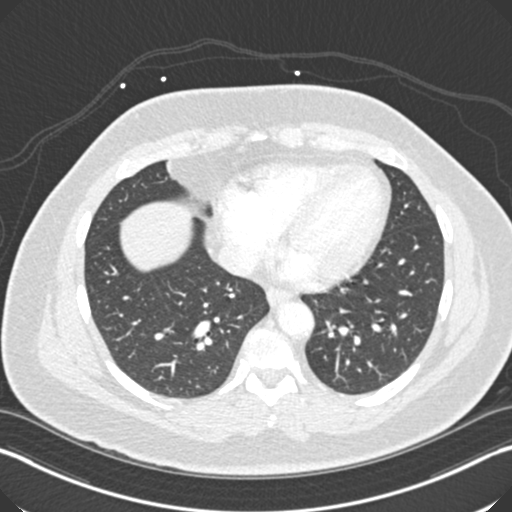
[im 120/285  mediastinal]
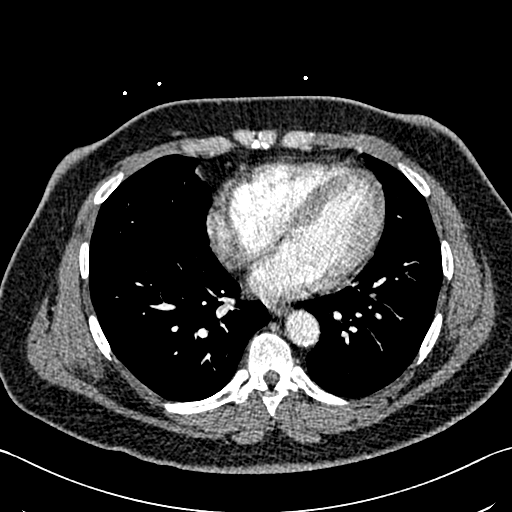
[im 135/285  lung]
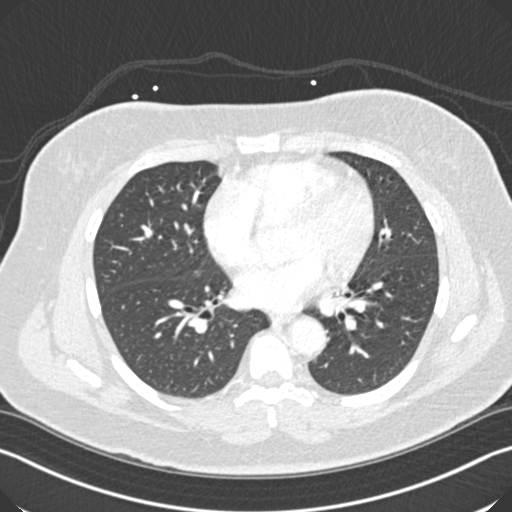
[im 150/285  mediastinal]
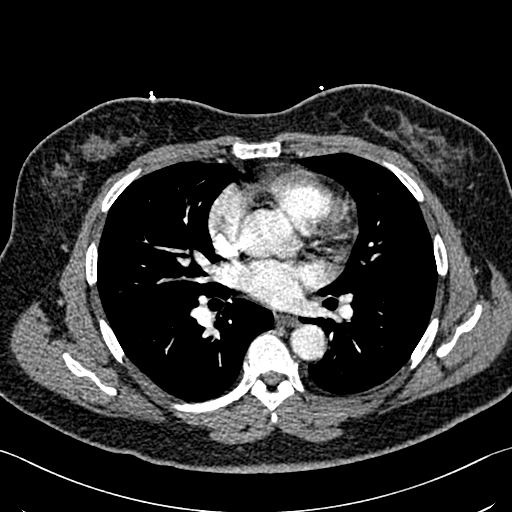
[im 165/285  lung]
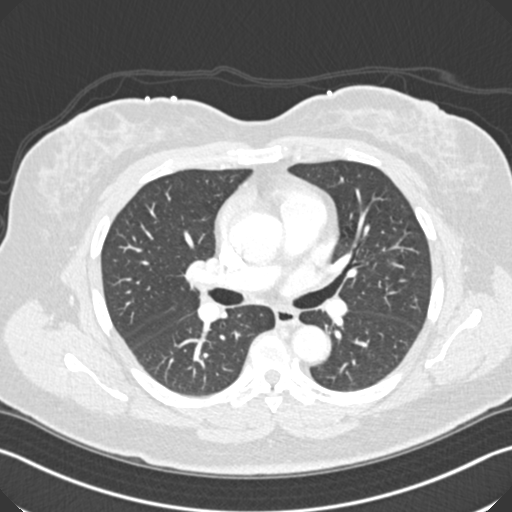
[im 180/285  mediastinal]
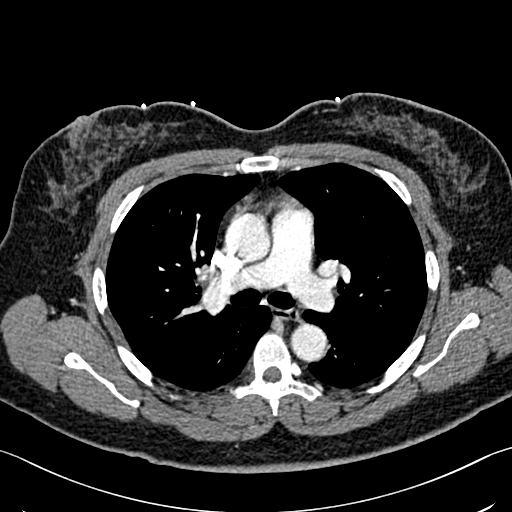
[im 195/285  lung]
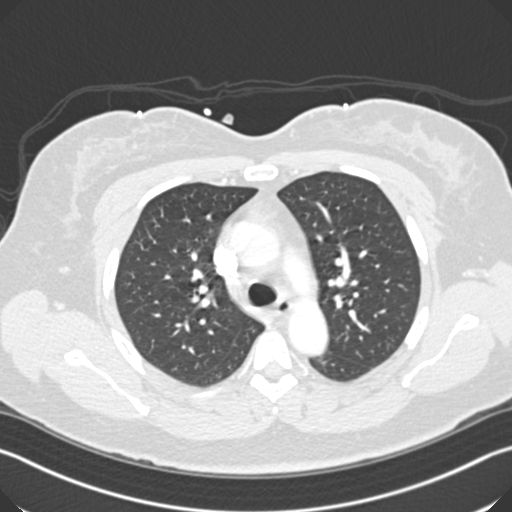
[im 210/285  mediastinal]
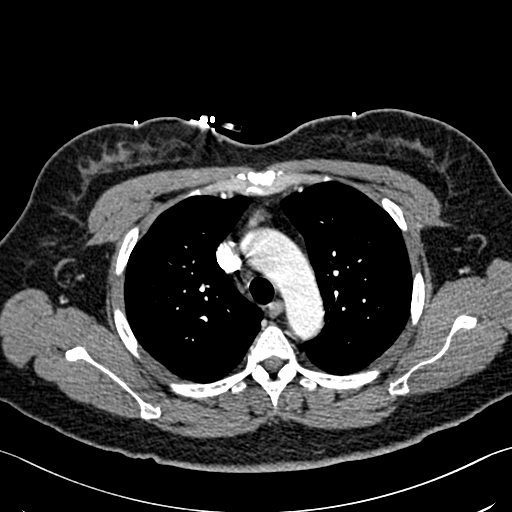
[im 225/285  lung]
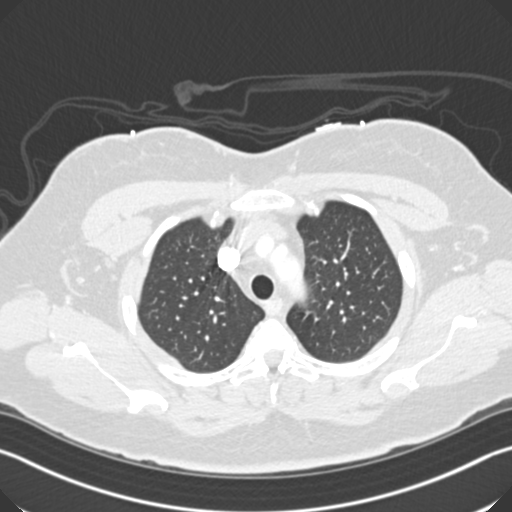
[im 240/285  mediastinal]
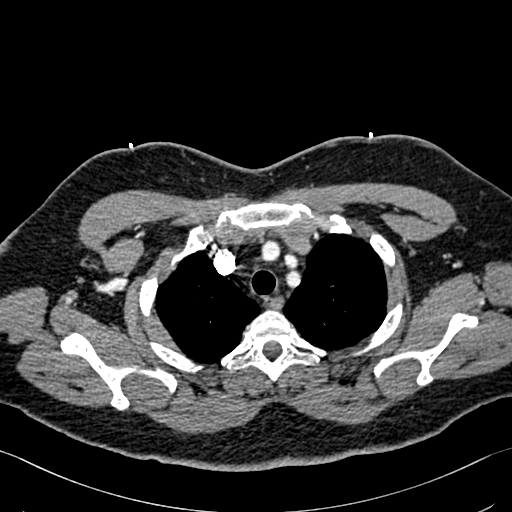
[im 255/285  lung]
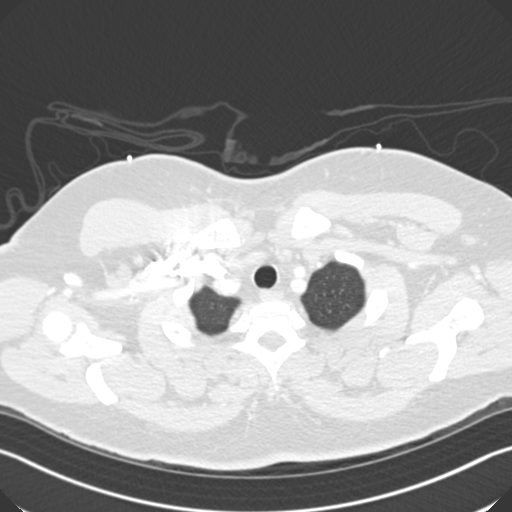
[im 270/285  mediastinal]
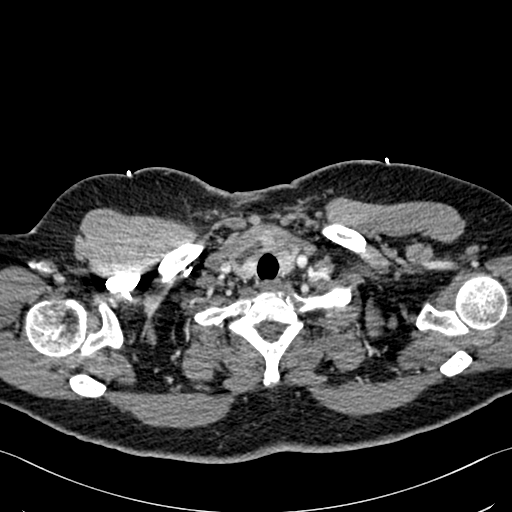

[19 of 36 positions shown; findings below may reference images not displayed]

FINDINGS: Cardiovascular: No filling defects in the pulmonary arteries to
suggest pulmonary emboli. Heart is normal size. Aorta is normal
caliber. Scattered calcifications in the descending thoracic aorta.

Mediastinum/Nodes: No mediastinal, hilar, or axillary adenopathy.
Trachea and esophagus are unremarkable. Bilateral thyroid nodules
noted, the largest in the right thyroid measures up to 2 cm.

Lungs/Pleura: Lungs are clear. No focal airspace opacities or
suspicious nodules. No effusions.

Upper Abdomen: Imaging into the upper abdomen shows no acute
findings.

Musculoskeletal: Chest wall soft tissues are unremarkable. No acute
bony abnormality.

Review of the MIP images confirms the above findings.
IMPRESSION: No evidence of pulmonary embolus.

No acute cardiopulmonary disease.

Bilateral thyroid nodules, the largest 2 cm in the right thyroid
lobe. Recommend thyroid US (ref: [HOSPITAL]. [DATE]):

Aortic atherosclerosis.

## 2020-10-20 ENCOUNTER — Other Ambulatory Visit: Payer: Self-pay | Admitting: Family

## 2020-11-06 ENCOUNTER — Other Ambulatory Visit: Payer: Self-pay | Admitting: Family

## 2020-11-23 ENCOUNTER — Other Ambulatory Visit: Payer: Self-pay

## 2020-11-23 ENCOUNTER — Encounter: Payer: Self-pay | Admitting: Internal Medicine

## 2020-11-23 ENCOUNTER — Ambulatory Visit: Payer: BC Managed Care – PPO | Admitting: Internal Medicine

## 2020-11-23 VITALS — BP 134/86 | HR 92 | Ht 65.0 in | Wt 199.5 lb

## 2020-11-23 DIAGNOSIS — E042 Nontoxic multinodular goiter: Secondary | ICD-10-CM

## 2020-11-23 NOTE — Patient Instructions (Signed)
-   I have ordered a thyroid ultrasound, they will call you for that .

## 2020-11-23 NOTE — Progress Notes (Signed)
Name: Michele [name]  MRN/ DOB: [id number], 1971-06-20    Age/ Sex: 50 y.o., female    PCP: Debbrah Alar, NP   Reason for Endocrinology Evaluation: MNG     Date of Initial Endocrinology Evaluation: 11/23/2020     HPI: [name] is a 50 y.o. female with a past medical history of MNG. The patient presented for initial endocrinology clinic visit on 11/23/2020 for consultative assistance with her [place]   She has been diagnosed with MNG in 2012.  She is S/P benign FNA in 2012 of the right thyroid nodule ( 1.9 cm) and left thyroid nodule (1.5 cm)    Has local neck discomfort and occasional dysphagia, wakes up in the middle of the night with coughing  Has heartburn , which is severe  Has chronic constipation      No  biotin intake  No FH of thyroid disease      HISTORY:  Past Medical History:  Past Medical History:  Diagnosis Date  . Anemia    iron deficient, microcytic, hypochromic  . Anxiety   . Chest pain    atypical  . Depression   . Diabetes mellitus without complication ([place])   . Fatigue   . Fatty liver 08/15/2013  . Fibroids    uterine  . Hypertension   . Migraines   . 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 Posey Pronto at [facility]- S/p FNA March/april of two dominant nodules- benign.  Following for annual thyroid US with Dr. Posey Pronto.    . Palpitations    recurrent  . 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 Tachycardia    unspecified   Past Surgical History:  Past Surgical History:  Procedure Laterality Date  . APPENDECTOMY  03/2009  . BIOPSY THYROID  November 28, 2009   Dr. Posey Pronto- endo  . DILATION AND CURETTAGE OF UTERUS  05/21/2011   Procedure: DILATATION AND CURETTAGE (D&C);  Surgeon: Elveria Royals;  Location: [facility];  Service: Gynecology;  Laterality: N/A;  dilitation and currettage/endometrial currettings  . TUBAL LIGATION  1997  . TUBOPLASTY / TUBOTUBAL ANASTOMOSIS  2005      Social History:  reports  that she has never smoked. She has never used smokeless tobacco. She reports current alcohol use. She reports that she does not use drugs.  Family History: family history includes Arthritis in her maternal grandmother; Asthma in her son; Cancer in her father; Cancer (age of onset: 87) in her mother; Heart attack in her maternal grandmother; Hypertension in her maternal grandmother, maternal uncle, and mother; Seizures in her brother; Stroke in her brother and maternal grandfather.   HOME MEDICATIONS: Allergies as of 11/23/2020      Reactions   Amlodipine Shortness Of Breath, Anxiety, Palpitations, Other (See Comments)   Bee Venom Anaphylaxis   Bupropion Shortness Of Breath, Anxiety, Palpitations, Other (See Comments)   Clonidine Derivatives    dizziness   Hydralazine    dizziness   Imitrex [sumatriptan] Nausea Only   Oxycodone Nausea Only      Medication List       Accurate as of November 23, 2020  7:44 AM. If you have any questions, ask your nurse or doctor.        STOP taking these medications   citalopram 20 MG tablet Commonly known as: CELEXA Stopped by: Dorita Sciara, MD   ibuprofen 800 MG tablet Commonly known as: ADVIL Stopped by: Dorita Sciara, MD  TAKE these medications   Ajovy 225 MG/1.5ML Soaj Generic drug: Fremanezumab-vfrm Inject 225 mg into the skin every 30 (thirty) days.   ALPRAZolam 1 MG tablet Commonly known as: XANAX Take 1 mg by mouth as needed. For anxiety   EpiPen 2-Pak 0.3 mg/0.3 mL Soaj injection Generic drug: EPINEPHrine daily as needed (FOR ALLERGY).   hydrochlorothiazide 25 MG tablet Commonly known as: HYDRODIURIL Take 1 tablet (25 mg total) by mouth daily.   sertraline 100 MG tablet Commonly known as: ZOLOFT Take 100 mg by mouth daily.   Ubrelvy 100 MG Tabs Generic drug: Ubrogepant TAKE 1 TABLET AT HEADACHE ONSET. MAY REPEAT IN 2 HORUS IF NEEDED. MAX 2 IN 24 HOURS   valACYclovir 1000 MG tablet Commonly known as:  VALTREX TAKE 2 TABLETS BY MOUTH NOW AND AGAIN IN 12 HOURS   valsartan 160 MG tablet Commonly known as: Diovan Take 1 tablet (160 mg total) by mouth daily.         REVIEW OF SYSTEMS: A comprehensive ROS was conducted with the patient and is negative except as per HPI and below:  ROS     OBJECTIVE:  VS: BP 134/86   Pulse 92   Ht 5\' 5"  (1.651 m)   Wt 199 lb 8 oz (90.5 kg)   LMP 06/11/2013   SpO2 99%   BMI 33.20 kg/m    Wt Readings from Last 3 Encounters:  11/23/20 199 lb 8 oz (90.5 kg)  09/21/20 190 lb (86.2 kg)  03/10/20 199 lb (90.3 kg)     EXAM: General: Pt appears well and is in NAD  Neck: General: Supple without adenopathy. Thyroid: Right thyroid  nodule appreciated.   Lungs: Clear with good BS bilat with no rales, rhonchi, or wheezes  Heart: Auscultation: RRR.  Abdomen: Normoactive bowel sounds, soft, nontender, without masses or organomegaly palpable  Extremities:  BL LE: No pretibial edema normal ROM and strength.  Skin: Hair: Texture and amount normal with gender appropriate distribution Skin Inspection: No rashes Skin Palpation: Skin temperature, texture, and thickness normal to palpation  Neuro: Cranial nerves: II - XII grossly intact  Motor: Normal strength throughout DTRs: 2+ and symmetric in UE without delay in relaxation phase  Mental Status: Judgment, insight: Intact Orientation: Oriented to time, place, and person Mood and affect: No depression, anxiety, or agitation     DATA REVIEWED: Results for JANNETT, SCHMALL (MRN [id number]) as of 11/23/2020 07:42  Ref. Range 09/21/2020 15:41  TSH Latest Ref Range: 0.35 - 4.50 uIU/mL 1.13  Triiodothyronine,Free,Serum Latest Ref Range: 2.3 - 4.2 pg/mL 3.2  T4,Free(Direct) Latest Ref Range: 0.60 - 1.60 ng/dL 0.76       FNA right thyroid nodule 11/30/2010  Benign    FNA left thyroid nodule 11/30/2019 Non diagnostic    FNA of left thyroid nodule 12/22/2010 Benign    Thyroid ultrasound  01/13/2020  There are 2 adjacent punctate (subcentimeter) hypoattenuating nodules within the superior pole of the right lobe of the thyroid which are unchanged in hind site compared to the 2017 examination and do not meet imaging criteria to recommend percutaneous sampling or continued dedicated follow-up.  _________________________________________________________  Nodule # 2:  Prior biopsy: No  Location: Right; Mid  Maximum size: 1.2 cm; Other 2 dimensions: 1.0 x 0.7 cm, previously, 0.9 x 0.9 x 0.6 cm (when compared to the 05/2016 examination, not seen on the 10/2010 exam)  Composition: solid/almost completely solid (2)  Echogenicity: hypoechoic (2)  Shape: not taller-than-wide (0)  Margins: smooth (  0)  Echogenic foci: none (0)  ACR TI-RADS total points: 4.  ACR TI-RADS risk category:  TR4 (4-6 points).  Significant change in size (>/= 20% in two dimensions and minimal increase of 2 mm): Yes  Change in features: No  Change in ACR TI-RADS risk category: No  ACR TI-RADS recommendations:  *Given size (>/= 1 - 1.4 cm) and appearance, a follow-up ultrasound in 1 year should be considered based on TI-RADS criteria.  _________________________________________________________  Nodule # 3:  Prior biopsy: No  Location: Right; Inferior  Maximum size: 2.5 cm; Other 2 dimensions: 2.4 x 1.2 cm, previously, 2.7 x 2.1 x 1.1 cm (when compared to the 2017 examination), previously, 1.9 x 1.6 x 0.9 cm when compared to the 20/12 examination.  Composition: solid/almost completely solid (2)  Echogenicity: hypoechoic (2)  Shape: not taller-than-wide (0)  Margins: smooth (0)  Echogenic foci: punctate echogenic foci (3)  ACR TI-RADS total points: 7.  ACR TI-RADS risk category:  TR5 (>/= 7 points).  Significant change in size (>/= 20% in two dimensions and minimal increase of 2 mm): Yes  Change in features: No  Change in ACR TI-RADS  risk category: No  ACR TI-RADS recommendations:  **Given size (>/= 1.0 cm) and appearance, fine needle aspiration of this highly suspicious nodule should be considered based on TI-RADS criteria.  _________________________________________________________  Punctate (approximately 0.8 cm) hypoechoic nodule within the superior pole the left lobe of the thyroid (labeled 4) is unchanged compared to the 10/2015 examination and again does not meet criteria to recommend percutaneous sampling or continued dedicated follow-up  The approximately 1.0 cm hypoechoic nodule within the superior/mid aspect the left lobe of the thyroid (labeled 5) is unchanged compared to the 20/12 examination, previously, 0.8 cm. Stability for greater than 5 years is indicative of a benign etiology.  _________________________________________________________  Nodule # 6:  Prior biopsy: No  Location: Left; Inferior  Maximum size: 1.8 cm; Other 2 dimensions: 1.4 x 1.2 cm, previously, 1.7 x 1.5 x 1.0 cm, previously, 1.5 x 1.3 x 0.8 cm when compared to the 20/12 examination  Composition: solid/almost completely solid (2)  Echogenicity: hypoechoic (2)  Shape: not taller-than-wide (0)  Margins: smooth (0)  Echogenic foci: none (0)  ACR TI-RADS total points: 4.  ACR TI-RADS risk category:  TR4 (4-6 points).  Significant change in size (>/= 20% in two dimensions and minimal increase of 2 mm): Yes  Change in features: No  Change in ACR TI-RADS risk category: No  ACR TI-RADS recommendations:  **Given size (>/= 1.5 cm) and appearance, fine needle aspiration of this moderately suspicious nodule should be considered based on TI-RADS criteria.  _________________________________________________________  IMPRESSION: 1. Nodules #3 and #6 have both increased in size and again both meet imaging criteria to recommend percutaneous sampling as clinically indicated. 2. Nodule #2 meets  imaging criteria to recommend a 1 year follow-up. This examination documents 4 years of relative stability. Follow-up examination in 05/2021 would ensure 5 years of stability and thus a benign etiology.     ASSESSMENT/PLAN/RECOMMENDATIONS:   1. MNG:   - Pt is clinically and biochemically euthyroid  - She has non-specific local neck symptoms that I personally believe are more related to her GERD at this time  - She had an Ultrasound in 01/2020, with FNA recommendations but was lost to follow up  - Will repeat thyroid ultrasound     F/U in 1 yr   Signed electronically by: Mack Guise, MD  [facility]  [address] [place], [place] [address] Phone: [phone] FAX: [phone]   CC: Debbrah Alar, NP [address] [place] [address] Phone: [phone] Fax: [phone]   Return to Endocrinology clinic as below: Future Appointments  Date Time Provider [place]  12/28/2020  7:40 AM Debbrah Alar, NP [facility]

## 2020-12-07 ENCOUNTER — Ambulatory Visit (HOSPITAL_BASED_OUTPATIENT_CLINIC_OR_DEPARTMENT_OTHER): Admission: RE | Admit: 2020-12-07 | Payer: BC Managed Care – PPO | Source: Ambulatory Visit

## 2020-12-13 ENCOUNTER — Other Ambulatory Visit: Payer: Self-pay | Admitting: Family

## 2020-12-28 ENCOUNTER — Ambulatory Visit: Payer: BC Managed Care – PPO | Admitting: Family

## 2021-01-24 DIAGNOSIS — F4322 Adjustment disorder with anxiety: Secondary | ICD-10-CM | POA: Diagnosis not present

## 2021-01-24 DIAGNOSIS — F410 Panic disorder [episodic paroxysmal anxiety] without agoraphobia: Secondary | ICD-10-CM | POA: Diagnosis not present

## 2021-02-01 DIAGNOSIS — R5383 Other fatigue: Secondary | ICD-10-CM | POA: Diagnosis not present

## 2021-02-01 DIAGNOSIS — G43719 Chronic migraine without aura, intractable, without status migrainosus: Secondary | ICD-10-CM | POA: Diagnosis not present

## 2021-02-01 DIAGNOSIS — M542 Cervicalgia: Secondary | ICD-10-CM | POA: Diagnosis not present

## 2021-02-01 DIAGNOSIS — G510 Bell's palsy: Secondary | ICD-10-CM | POA: Diagnosis not present

## 2021-02-15 DIAGNOSIS — L918 Other hypertrophic disorders of the skin: Secondary | ICD-10-CM | POA: Diagnosis not present

## 2021-02-15 DIAGNOSIS — L700 Acne vulgaris: Secondary | ICD-10-CM | POA: Diagnosis not present

## 2021-02-28 ENCOUNTER — Other Ambulatory Visit: Payer: Self-pay | Admitting: General Practice

## 2021-02-28 ENCOUNTER — Other Ambulatory Visit: Payer: Self-pay | Admitting: Family

## 2021-03-17 ENCOUNTER — Other Ambulatory Visit: Payer: Self-pay | Admitting: General Practice

## 2021-03-23 ENCOUNTER — Encounter: Payer: Self-pay | Admitting: Family

## 2021-03-24 MED ORDER — MOLNUPIRAVIR 200 MG PO CAPS: 800.0000 mg | ORAL_CAPSULE | Freq: Two times a day (BID) | ORAL | 0 refills | Status: AC

## 2021-04-11 ENCOUNTER — Telehealth: Payer: Self-pay | Admitting: Cardiovascular Disease

## 2021-04-11 ENCOUNTER — Encounter: Payer: Self-pay | Admitting: Hematology & Oncology

## 2021-04-11 NOTE — Telephone Encounter (Signed)
Spoke with pt, her bp was running good until she got covid while on vacation in [place]. She was given lagevrio 800 mg and she took 4 tablets twice daily for total of 5 days. Her last dose of that medication was 7/18. She continues to have a headache, diaphoresis and cough with no sputum production. She also reports feeling lightheaded when moving around. She denies any SOB and her oxygen level has been 95-96%. The bp reading have been for the last 5-7 days. Medication confirmed and she reports taking her medications in the evening and the bp is when she gets up in then morning. Aware dr Oval Linsey is not in the office but will forward for her and the pharm md in the office to review. She was advised to try claratin or zyrtec to see if that would help with the lightheadedness as well.

## 2021-04-11 NOTE — Telephone Encounter (Signed)
LMOM for patient to return call.

## 2021-04-11 NOTE — Telephone Encounter (Signed)
Pt c/o BP issue: STAT if pt c/o blurred vision, one-sided weakness or slurred speech  1. What are your last 5 BP readings? Currently at 150/110 Pulse  91  Pt's bp have been elevated since she got over COVID  03/22/21  2. Are you having any other symptoms (ex. Dizziness, headache, blurred vision, passed out)? headache for 7 days  3. What is your BP issue?   Pt states that her BP has been elevated since she got over Covid on 03/22/21                                                   136/92 150/107 138/90 140/92

## 2021-04-12 ENCOUNTER — Ambulatory Visit (INDEPENDENT_AMBULATORY_CARE_PROVIDER_SITE_OTHER): Payer: BC Managed Care – PPO | Admitting: Family

## 2021-04-12 ENCOUNTER — Other Ambulatory Visit: Payer: Self-pay

## 2021-04-12 VITALS — BP 149/99 | HR 103 | Temp 98.1°F | Resp 16 | Wt 208.0 lb

## 2021-04-12 DIAGNOSIS — E042 Nontoxic multinodular goiter: Secondary | ICD-10-CM

## 2021-04-12 DIAGNOSIS — G4733 Obstructive sleep apnea (adult) (pediatric): Secondary | ICD-10-CM | POA: Diagnosis not present

## 2021-04-12 DIAGNOSIS — G43809 Other migraine, not intractable, without status migrainosus: Secondary | ICD-10-CM

## 2021-04-12 DIAGNOSIS — E119 Type 2 diabetes mellitus without complications: Secondary | ICD-10-CM

## 2021-04-12 DIAGNOSIS — F419 Anxiety disorder, unspecified: Secondary | ICD-10-CM | POA: Diagnosis not present

## 2021-04-12 DIAGNOSIS — I10 Essential (primary) hypertension: Secondary | ICD-10-CM

## 2021-04-12 DIAGNOSIS — F32A Depression, unspecified: Secondary | ICD-10-CM

## 2021-04-12 MED ORDER — HYDRALAZINE HCL 25 MG PO TABS: ORAL_TABLET | ORAL | 0 refills | Status: DC

## 2021-04-12 MED ORDER — VALSARTAN 320 MG PO TABS: 320.0000 mg | ORAL_TABLET | Freq: Every day | ORAL | 1 refills | Status: DC

## 2021-04-12 NOTE — Assessment & Plan Note (Signed)
Uncontrolled. Multiple drug intolerances.  Will give retrial of hydralazine 12.'5mg'$  bid x 1 week, then '25mg'$  bid.  Continue valsartan at '320mg'$  and HCTZ '25mg'$ .  (Check follow up bmet).

## 2021-04-12 NOTE — Assessment & Plan Note (Signed)
I suspect that her symptoms are being made worse by untreated sleep apnea (headaches and uncontrolled BP). She might be interested in an oral appliance. She is waiting for her insurance to become active and get some days off from work.  She will send me a mychart message when she is ready.

## 2021-04-12 NOTE — Assessment & Plan Note (Signed)
Clinically stable. She has had some weight gain. Obtain follow up A1C.

## 2021-04-12 NOTE — Assessment & Plan Note (Signed)
Recent worsening anxiety.  Management per psych.

## 2021-04-12 NOTE — Assessment & Plan Note (Signed)
Advised pt to call imaging to schedule her [name] for endo.

## 2021-04-12 NOTE — Progress Notes (Signed)
Subjective:     Patient ID: Michele [name], female    DOB: [birth year], 50 y.o.   MRN: [id number]  Chief Complaint  Patient presents with   Hypertension    Patient reports BP has been higher in the past 2 weeks since having covid    HPI Patient is in today for follow up. She started a new job at home, working for [employer].    HTN- maintained on diovan '160mg'$ , hctz '25mg'$  once daily. States that a few weeks ago she increased her diovan to '320mg'$  on her own.  She reports that her home readings were 150/110.   Reports that she has had some bradycardia per apple [employer].  Reports that when she was taking a beta blocker her HR went to the 30's and 40's.  She is scheduled to see cardiology   BP Readings from Last 3 Encounters:  04/12/21 (!) 149/99  11/23/20 134/86  09/21/20 (!) 141/88   OSA- did not tolerate CPAP.   Headaches- uncontrolled. Michele [name] and   Thyroid nodules- saw Endo, did not follow through with recommended [name].    Migraines- Ubrevly and Ajovy without improvement so they were discontinued.   Depression/anxiety- psych increased her sertraline from '100mg'$  to '200mg'$ . maintained on zoloft '100mg'$ .   DM2- diet controlled.  Wt Readings from Last 3 Encounters:  04/12/21 208 lb (94.3 kg)  11/23/20 199 lb 8 oz (90.5 kg)  09/21/20 190 lb (86.2 kg)   Lab Results  Component Value Date   HGBA1C 5.6 09/21/2020   HGBA1C 6.0 03/10/2020   HGBA1C 5.9 12/03/2019   Lab Results  Component Value Date   MICROALBUR 0.9 07/04/2017   LDLCALC 139 (H) 09/21/2020   CREATININE 0.85 09/21/2020       Health Maintenance Due  Topic Date Due   COVID-19 Vaccine (1) Never done   Zoster Vaccines- Shingrix (1 of 2) Never done   COLONOSCOPY (Pts 45-34yr Insurance coverage will need to be confirmed)  Never done   FOOT EXAM  10/04/2018   OPHTHALMOLOGY EXAM  06/06/2019   Pneumococcal Vaccine 07565Years old (2 - PCV) 09/14/2019   HEMOGLOBIN A1C  03/21/2021   PAP SMEAR-Modifier   04/05/2021   INFLUENZA VACCINE  04/11/2021    Past Medical History:  Diagnosis Date   Anemia    iron deficient, microcytic, hypochromic   Anxiety    Chest pain    atypical   Depression    Diabetes mellitus without complication (HCC)    Fatigue    Fatty liver 08/15/2013   Fibroids    uterine   Hypertension    Migraines    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ap[facility] of two dominant nodules- benign.  Following for annual thyroid [place] Dr. 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 PPosey Pronto endo   DILATION AND CURETTAGE OF UTERUS  05/21/2011   Procedure: DILATATION AND CURETTAGE (D&C);  Surgeon: VElveria Royals  [facility];  Service: Gynecology;  Laterality: N/A;  dilitation and currettage/endometrial currettings   TUBAL LIGATION  1997   TUBOPLASTY / TUBOTUBAL ANASTOMOSIS  2005    Family History  Problem Relation Age of Onset   Hypertension  Mother    Cancer Mother 65       breast   Cancer Father        colon   Stroke Brother        handicapped due to complications from spinal meningitis   Seizures Brother    Asthma Son    Arthritis Maternal Grandmother    Hypertension Maternal Grandmother    Heart attack Maternal Grandmother    Stroke Maternal Grandfather    Hypertension Maternal Uncle     Social History   Socioeconomic History   Marital status: Married    Spouse name: Warehouse manager   Number of children: 2   Years of education: Not on file   Highest education level: Not on file  Occupational History   Occupation: CLAIMS AGENT    Employer: [employer]  Tobacco Use   Smoking status: Never   Smokeless tobacco: Never   Tobacco comments:    never used tobacco  Substance and Sexual Activity   Alcohol use: Yes    Comment:  Once a month   Drug use: No   Sexual activity: Yes    Birth control/protection: None, Surgical  Other Topics Concern   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 For anxiety     EPIPEN 2-PAK 0.3 MG/0.3ML SOAJ injection daily as needed (FOR ALLERGY).      hydrochlorothiazide (HYDRODIURIL) 25 MG tablet Take 1 tablet by mouth once daily 90 tablet 0   sertraline (ZOLOFT) 100 MG tablet Take 100 mg by mouth daily.     valACYclovir (VALTREX) 1000 MG tablet TAKE 2 TABLETS BY MOUTH NOW AND AGAIN IN 12 HOURS 4 tablet 5   AJOVY 225 MG/1.5ML SOAJ Inject 225 mg into the skin every 30 (thirty) days.     UBRELVY 100 MG TABS TAKE 1 TABLET AT HEADACHE ONSET. MAY REPEAT IN 2 HORUS IF NEEDED. MAX 2 IN 24 HOURS     valsartan (DIOVAN) 160 MG tablet Take 1 tablet (160 mg total) by mouth daily. NEED OV. 30 tablet 2   No facility-administered medications prior to visit.    Allergies  Allergen Reactions   Amlodipine Shortness Of Breath, Anxiety, Palpitations and Other (See Comments)   Bee Venom Anaphylaxis   Bupropion Shortness Of Breath, Anxiety, Palpitations and Other (See Comments)   Clonidine Derivatives     dizziness   Hydralazine     dizziness   Imitrex [Sumatriptan] Nausea Only   Oxycodone Nausea Only    ROS     Objective:    Physical Exam Constitutional:      General: She is not in acute distress.    Appearance: Normal  appearance. She is well-developed.  HENT:     Head: Normocephalic and atraumatic.     Right Ear: External ear normal.     Left Ear: External ear normal.  Eyes:     General: No scleral icterus. Neck:     Thyroid: No thyromegaly.  Cardiovascular:     Rate and Rhythm: Normal rate and regular rhythm.     Heart sounds: Normal heart sounds. No murmur heard. Pulmonary:     Effort: Pulmonary effort is normal. No respiratory distress.     Breath sounds: Normal breath sounds. No wheezing.  Musculoskeletal:     Cervical back: Neck supple.  Skin:    General: Skin is warm and dry.  Neurological:     Mental Status: She is alert and oriented to person, place, and time.  Psychiatric:        Mood and Affect: Mood normal.        Behavior: Behavior normal.        Thought Content: Thought content normal.        Judgment: Judgment normal.    BP (!) 149/99 (BP Location: Left Arm, Patient Position: Sitting, Cuff Size: Small)   Pulse (!) 103   Temp 98.1 F (36.7 C) (Oral)   Resp 16   Wt 208 lb (94.3 kg)   LMP 06/11/2013   SpO2 98%   BMI 34.61 kg/m  Wt Readings from Last 3 Encounters:  04/12/21 208 lb (94.3 kg)  11/23/20 199 lb 8 oz (90.5 kg)  09/21/20 190 lb (86.2 kg)       Assessment & Plan:   Problem List Items Addressed This Visit       Unprioritized   OSA (obstructive sleep apnea)    I suspect that her symptoms are being made worse by untreated sleep apnea (headaches and uncontrolled BP). She might be interested in an oral appliance. She is waiting for her insurance to become active and get some days off from work.  She will send me a mychart message when she is ready.        Nontoxic multinodular goiter    Advised pt to call imaging to schedule her [name] for endo.        Migraine    Uncontrolled. Defer management to neurology.        Relevant Medications   valsartan (DIOVAN) 320 MG tablet   hydrALAZINE (APRESOLINE) 25 MG tablet   Essential hypertension    Uncontrolled.  Multiple drug intolerances.  Will give retrial of hydralazine 12.'5mg'$  bid x 1 week, then '25mg'$  bid.  Continue valsartan at '320mg'$  and HCTZ '25mg'$ .  (Check follow up bmet).         Relevant Medications   valsartan (DIOVAN) 320 MG tablet   hydrALAZINE (APRESOLINE) 25 MG tablet   Diabetes type 2, controlled (HCC)    Clinically stable. She has had some weight gain. Obtain follow up A1C.       Relevant Medications   valsartan (DIOVAN) 320 MG tablet   Anxiety and depression    Recent worsening anxiety.  Management per psych.        Other Visit Diagnoses     Primary hypertension    -  Primary   Relevant Medications   valsartan (DIOVAN) 320 MG tablet   hydrALAZINE (APRESOLINE) 25 MG tablet   Other Relevant Orders   Basic metabolic panel       I have discontinued Edgar R. Ousley-Blakeney's Melodie Bouillon, and valsartan. I am also having her start on valsartan and hydrALAZINE. Additionally, I am having her maintain her ALPRAZolam, EpiPen 2-Pak, sertraline, valACYclovir, and hydrochlorothiazide.  Meds ordered this encounter  Medications   valsartan (DIOVAN) 320 MG tablet  Sig: Take 1 tablet (320 mg total) by mouth daily.    Dispense:  90 tablet    Refill:  1    Order Specific Question:   Supervising Provider    Answer:   Penni Homans A [[id number]]   hydrALAZINE (APRESOLINE) 25 MG tablet    Sig: 1/2 tab twice daily for 1 week, then increase to a full tab twice daily on week two    Dispense:  60 tablet    Refill:  0    Order Specific Question:   Supervising Provider    Answer:   Penni Homans A [[id number]]

## 2021-04-12 NOTE — Patient Instructions (Signed)
Please start hydralazine 1/2 tab twice daily for 1 week, then increase to a full tab twice daily on week two.

## 2021-04-12 NOTE — Assessment & Plan Note (Signed)
Uncontrolled. Defer management to neurology.

## 2021-04-13 LAB — BASIC METABOLIC PANEL
BUN: 18 mg/dL (ref 6–23)
CO2: 29 mEq/L (ref 19–32)
Calcium: 10.2 mg/dL (ref 8.4–10.5)
Chloride: 100 mEq/L (ref 96–112)
Creatinine, Ser: 0.97 mg/dL (ref 0.40–1.20)
GFR: 67.64 mL/min (ref >60.00)
Glucose, Bld: 97 mg/dL (ref 70–99)
Potassium: 4.6 mEq/L (ref 3.5–5.1)
Sodium: 141 mEq/L (ref 135–145)

## 2021-04-15 NOTE — Telephone Encounter (Signed)
Asked that we call back after 30 minutes

## 2021-04-25 DIAGNOSIS — F410 Panic disorder [episodic paroxysmal anxiety] without agoraphobia: Secondary | ICD-10-CM | POA: Diagnosis not present

## 2021-04-25 DIAGNOSIS — F331 Major depressive disorder, recurrent, moderate: Secondary | ICD-10-CM | POA: Diagnosis not present

## 2021-04-26 ENCOUNTER — Encounter: Payer: Self-pay | Admitting: Hematology & Oncology

## 2021-04-26 ENCOUNTER — Ambulatory Visit: Payer: Self-pay | Admitting: Family

## 2021-04-26 ENCOUNTER — Telehealth: Payer: Self-pay

## 2021-04-26 NOTE — Telephone Encounter (Signed)
--  Edit: LVM for pt to rtn call to r/s 2 week f/u.- tlc, rma  [employer] [facility] - Client Client Site [facility] - Night Physician Debbrah Alar - NP Contact Type Call Who Is Calling Patient / Member / Family / Caregiver Caller Name Mariangela Lanthier Caller Phone Number [phone] Patient Name Michele [name] Patient DOB [birth year] Call Type Message Only Information Provided Reason for Call Request to Schedule Office Appointment Initial Comment Caller states she is needing to reschedule her appointment per office directive. Patient request to speak to RN No Additional Comment Declined triage. Office hours provided. Disp. Time Disposition Final User 04/25/2021 5:16:21 PM General Information Provided Yes Uvaldo Rising Call Closed By: Uvaldo Rising Transaction Date/Time: 04/25/2021 5:13:11 PM (ET)

## 2021-04-26 NOTE — Telephone Encounter (Signed)
Patient scheduled for tomorrow am

## 2021-04-27 ENCOUNTER — Ambulatory Visit: Payer: Self-pay | Admitting: Family

## 2021-04-27 NOTE — Progress Notes (Incomplete)
Subjective:   By signing my name below, I, Shehryar Baig, attest that this documentation has been prepared under the direction and in the presence of Debbrah Alar NP. 04/27/2021    Patient ID: Michele [name], female    DOB: August 13, 1971, 50 y.o.   MRN: [id number]  No chief complaint on file.   HPI Patient is in today for a office visit.   Health Maintenance Due  Topic Date Due   COVID-19 Vaccine (1) Never done   Zoster Vaccines- Shingrix (1 of 2) Never done   COLONOSCOPY (Pts 45-27yr Insurance coverage will need to be confirmed)  Never done   FOOT EXAM  10/04/2018   OPHTHALMOLOGY EXAM  06/06/2019   Pneumococcal Vaccine 0103617Years old (2 - PCV) 09/14/2019   HEMOGLOBIN A1C  03/21/2021   PAP SMEAR-Modifier  04/05/2021   INFLUENZA VACCINE  04/11/2021    Past Medical History:  Diagnosis Date   Anemia    iron deficient, microcytic, hypochromic   Anxiety    Chest pain    atypical   Depression    Diabetes mellitus without complication (HCC)    Fatigue    Fatty liver 08/15/2013   Fibroids    uterine   Hypertension    Migraines    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/april of two dominant nodules- benign.  Following for annual thyroid [place] Dr. 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 PPosey Pronto endo   DILATION AND CURETTAGE OF UTERUS  05/21/2011   Procedure: DILATATION AND CURETTAGE (D&C);  Surgeon: VElveria Royals  [facility] [facility];  Service: Gynecology;  Laterality: N/A;  dilitation and currettage/endometrial currettings   TUBAL LIGATION  1997   TUBOPLASTY / TUBOTUBAL ANASTOMOSIS  2005    Family History  Problem Relation Age of Onset   Hypertension Mother    Cancer Mother 413      breast   Cancer Father         colon   Stroke Brother        handicapped due to complications from spinal meningitis   Seizures Brother    Asthma Son    Arthritis Maternal Grandmother    Hypertension Maternal Grandmother    Heart attack Maternal Grandmother    Stroke Maternal Grandfather    Hypertension Maternal Uncle     Social History   Socioeconomic History   Marital status: Married    Spouse name: DWarehouse manager  Number of children: 2   Years of education: Not on file   Highest education level: Not on file  Occupational History   Occupation: CLAIMS AGENT    Employer: [employer]  Tobacco Use   Smoking status: Never   Smokeless tobacco: Never   Tobacco comments:    never used tobacco  Substance and Sexual Activity   Alcohol use: Yes    Comment: Once a month   Drug use: No   Sexual activity: Yes    Birth control/protection: None, Surgical  Other Topics Concern   Not on file  Social History Narrative   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 For anxiety     EPIPEN 2-PAK 0.3 MG/0.3ML SOAJ injection daily as needed (FOR ALLERGY).      hydrALAZINE (APRESOLINE) 25 MG tablet 1/2 tab twice daily for 1 week, then increase to a full tab twice daily on week two 60 tablet 0   hydrochlorothiazide (HYDRODIURIL) 25 MG tablet Take 1 tablet by mouth once daily 90 tablet 0   sertraline (ZOLOFT) 100 MG tablet Take 100 mg by mouth daily.     valACYclovir (VALTREX) 1000 MG tablet TAKE 2 TABLETS  BY MOUTH NOW AND AGAIN IN 12 HOURS 4 tablet 5   valsartan (DIOVAN) 320 MG tablet Take 1 tablet (320 mg total) by mouth daily. 90 tablet 1   No facility-administered medications prior to visit.    Allergies  Allergen Reactions   Amlodipine Shortness Of Breath, Anxiety, Palpitations and Other (See Comments)   Bee Venom Anaphylaxis   Bupropion Shortness Of Breath, Anxiety, Palpitations and Other (See Comments)   Clonidine Derivatives     dizziness   Hydralazine     dizziness   Imitrex [Sumatriptan] Nausea Only   Oxycodone Nausea Only    ROS     Objective:    Physical Exam Constitutional:      General: She is not in acute distress.    Appearance: Normal appearance. She is not ill-appearing.  HENT:     Head: Normocephalic and atraumatic.     Right Ear: External ear normal.     Left Ear: External ear normal.  Eyes:     Extraocular Movements: Extraocular movements intact.     Pupils: Pupils are equal, round, and reactive to light.  Cardiovascular:     Rate and Rhythm: Normal rate and regular rhythm.     Heart sounds: Normal heart sounds. No murmur heard.   No gallop.  Pulmonary:     Effort: Pulmonary effort is normal. No respiratory distress.     Breath sounds: Normal breath sounds. No wheezing or rales.  Skin:    General: Skin is warm and dry.  Neurological:     Mental Status: She is alert and oriented to person, place, and time.  Psychiatric:        Behavior: Behavior normal.    LMP 06/11/2013  Wt Readings from Last 3 Encounters:  04/12/21 208 lb (94.3 kg)  11/23/20 199 lb 8 oz (90.5 kg)  09/21/20 190 lb (86.2 kg)       Assessment & Plan:   Problem List Items Addressed This Visit   None    No orders of the defined types were placed in this encounter.   I, Debbrah Alar NP, personally preformed the services described in this documentation.  All medical record entries made by the scribe were at my direction and in my presence.  I have reviewed the  chart and discharge instructions (if applicable) and agree that the record reflects my personal performance and is accurate and complete. 04/27/2021   I,Shehryar Baig,acting as a Education administrator for [employer]  Rowe Pavy, NP.,have documented all relevant documentation on the behalf of Nance Pear, NP,as directed by  Nance Pear, NP while in the presence of Nance Pear, NP.   Shehryar [employer]

## 2021-05-03 ENCOUNTER — Ambulatory Visit: Payer: BC Managed Care – PPO | Admitting: Family

## 2021-05-03 ENCOUNTER — Other Ambulatory Visit: Payer: Self-pay

## 2021-05-03 VITALS — BP 142/80 | HR 80 | Temp 98.2°F | Resp 16 | Wt 212.0 lb

## 2021-05-03 DIAGNOSIS — I10 Essential (primary) hypertension: Secondary | ICD-10-CM

## 2021-05-03 DIAGNOSIS — E669 Obesity, unspecified: Secondary | ICD-10-CM

## 2021-05-03 NOTE — Progress Notes (Signed)
Subjective:     Patient ID: Michele [name], female    DOB: [birth year], 50 y.o.   MRN: [id number]  Chief Complaint  Patient presents with  . Hypertension    Here for follow up    HPI  Patient is in today for follow up.   HTN- BP meds include HCTZ '25mg'$  and hydralazine '25mg'$  tid (which was started last visit).  She reports tolerating the hydralazine without any difficulty.  BP Readings from Last 3 Encounters:  05/03/21 (!) 142/80  04/12/21 (!) 149/99  11/23/20 134/86   She is interested in weight loss.   Wt Readings from Last 3 Encounters:  05/03/21 212 lb (96.2 kg)  04/12/21 208 lb (94.3 kg)  11/23/20 199 lb 8 oz (90.5 kg)     Health Maintenance Due  Topic Date Due  . COVID-19 Vaccine (1) Never done  . Zoster Vaccines- Shingrix (1 of 2) Never done  . COLONOSCOPY (Pts 45-21yr Insurance coverage will need to be confirmed)  Never done  . FOOT EXAM  10/04/2018  . OPHTHALMOLOGY EXAM  06/06/2019  . Pneumococcal Vaccine 056629Years old (2 - PCV) 09/14/2019  . HEMOGLOBIN A1C  03/21/2021  . PAP SMEAR-Modifier  04/05/2021  . INFLUENZA VACCINE  04/11/2021    Past Medical History:  Diagnosis Date  . Anemia    iron deficient, microcytic, hypochromic  . Anxiety   . Chest pain    atypical  . Depression   . Diabetes mellitus without complication ([facility] [place]   . Fatigue   . Fatty liver 08/15/2013  . Fibroids    uterine  . Hypertension   . Migraines   . 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/april of two dominant nodules- benign.  Following for annual thyroid [place] Dr. PPosey Pronto    . Palpitations    recurrent  . 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 Tachycardia    unspecified    Past Surgical History:  Procedure Laterality Date  . APPENDECTOMY  03/2009  . BIOPSY THYROID  November 28, 2009   Dr. PPosey Pronto endo  . DILATION AND CURETTAGE OF UTERUS  05/21/2011   Procedure: DILATATION AND CURETTAGE (D&C);  Surgeon:  VElveria Royals  [facility];  Service: Gynecology;  Laterality: N/A;  dilitation and currettage/endometrial currettings  . TUBAL LIGATION  1997  . TUBOPLASTY / TUBOTUBAL ANASTOMOSIS  2005    Family History  Problem Relation Age of Onset  . Hypertension Mother   . Cancer Mother 466      breast  . Cancer Father        colon  . Stroke Brother        handicapped due to complications from spinal meningitis  . Seizures Brother   . Asthma Son   . Arthritis Maternal Grandmother   . Hypertension Maternal Grandmother   . Heart attack Maternal Grandmother   . Stroke Maternal Grandfather   . Hypertension Maternal Uncle     Social History   Socioeconomic History  . Marital status: Married    Spouse name: DJaneece Riggers . Number of children: 2  . Years of education: Not on file  . Highest education level: Not on file  Occupational History  . Occupation: CLAIMS AGENT    Employer: [employer]  Tobacco Use  . Smoking status: Never  . Smokeless tobacco: Never  . Tobacco comments:    never used tobacco  Substance and Sexual  Activity  . Alcohol use: Yes    Comment: Once a month  . Drug use: No  . Sexual activity: Yes    Birth control/protection: None, Surgical  Other Topics Concern  . 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 ALPRAZolam (XANAX) 1 MG tablet Take 1 mg by mouth as needed. For anxiety    . busPIRone (BUSPAR) 15 MG tablet Take 15 mg by mouth 2 (two) times daily.     Marland Kitchen EPIPEN 2-PAK 0.3 MG/0.3ML SOAJ injection daily as needed (FOR ALLERGY).     . hydrALAZINE (APRESOLINE) 25 MG tablet 1/2 tab twice daily for 1 week, then increase to a full tab twice daily on week two 60 tablet 0  . hydrochlorothiazide (HYDRODIURIL) 25 MG tablet Take 1 tablet by mouth once daily 90 tablet 0  . sertraline (ZOLOFT) 100 MG tablet Take 100 mg by mouth daily.    . valACYclovir (VALTREX) 1000 MG tablet TAKE 2 TABLETS BY MOUTH NOW AND AGAIN IN 12 HOURS 4 tablet 5  . valsartan (DIOVAN) 320 MG tablet Take 1 tablet (320 mg total) by mouth daily. 90 tablet 1   No facility-administered medications prior to visit.    Allergies  Allergen Reactions  . Amlodipine Shortness Of Breath, Anxiety, Palpitations and Other (See Comments)  . Bee Venom Anaphylaxis  . Bupropion Shortness Of Breath, Anxiety, Palpitations and Other (See Comments)  . Clonidine Derivatives     dizziness  . Hydralazine     dizziness  . Imitrex [Sumatriptan] Nausea Only  . Oxycodone Nausea Only    ROS    See HPI  Objective:    Physical Exam Constitutional:      Appearance: She is well-developed.  Cardiovascular:     Rate and Rhythm: Normal rate and regular rhythm.     Heart sounds: Normal heart sounds. No murmur heard. Pulmonary:     Effort: Pulmonary effort is normal. No respiratory distress.     Breath sounds: Normal breath sounds. No wheezing.  Psychiatric:        Behavior: Behavior normal.        Thought Content: Thought content normal.        Judgment: Judgment normal.    BP (!) 142/80 (BP Location: Left Arm, Patient Position: Sitting, Cuff Size: Large)   Pulse 80   Temp 98.2 F (36.8 C) (Oral)   Resp 16   Wt 212 lb (96.2 kg)   LMP 06/11/2013   SpO2 98%   BMI 35.28 kg/m  Wt Readings from Last 3 Encounters:  05/03/21 212 lb (96.2 kg)  04/12/21 208 lb (94.3 kg)  11/23/20 199 lb 8 oz (90.5 kg)       Assessment & Plan:   Problem List Items Addressed This Visit       Unprioritized    Obesity - Primary    Uncontrolled with ongoing weight gain.       Relevant  Orders   Amb Ref to Medical Weight Management   Essential hypertension    BP is improved with the addition of hydralazine '25mg'$ .  Continue same.        I am having Amariyana R. Ousley-Blakeney maintain her ALPRAZolam, EpiPen 2-Pak, sertraline, valACYclovir, hydrochlorothiazide, valsartan, hydrALAZINE, and busPIRone.  No orders of the defined types were placed in this encounter.

## 2021-05-03 NOTE — Assessment & Plan Note (Signed)
BP is improved with the addition of hydralazine '25mg'$ .  Continue same.

## 2021-05-05 DIAGNOSIS — E669 Obesity, unspecified: Secondary | ICD-10-CM | POA: Insufficient documentation

## 2021-05-05 NOTE — Assessment & Plan Note (Signed)
Uncontrolled with ongoing weight gain.

## 2021-05-23 ENCOUNTER — Ambulatory Visit: Payer: BC Managed Care – PPO | Admitting: Family

## 2021-05-23 ENCOUNTER — Other Ambulatory Visit: Payer: Self-pay

## 2021-05-23 VITALS — BP 151/90 | HR 80 | Temp 98.5°F | Resp 16 | Wt 216.0 lb

## 2021-05-23 DIAGNOSIS — I10 Essential (primary) hypertension: Secondary | ICD-10-CM

## 2021-05-23 DIAGNOSIS — H8113 Benign paroxysmal vertigo, bilateral: Secondary | ICD-10-CM | POA: Diagnosis not present

## 2021-05-23 MED ORDER — MECLIZINE HCL 25 MG PO TABS: 25.0000 mg | ORAL_TABLET | Freq: Three times a day (TID) | ORAL | 2 refills | Status: DC | PRN

## 2021-05-23 NOTE — Patient Instructions (Signed)
Begin meclizine as needed for vertigo.

## 2021-05-23 NOTE — Assessment & Plan Note (Signed)
New. Trial of meclizine. She will let me know if symptoms worsen or if symptoms fail to improve.

## 2021-05-23 NOTE — Progress Notes (Signed)
Subjective:   By signing my name below, I, Shehryar Baig, attest that this documentation has been prepared under the direction and in the presence of Debbrah Alar NP. 05/23/2021    Patient ID: Michele [name], female    DOB: 1971-05-20, 50 y.o.   MRN: [id number]  Chief Complaint  Patient presents with   Dizziness    Complains of dizziness since starting BP med     HPI Patient is in today for a office visit. She complains of dizziness when getting up from a seated position since she started taking her blood pressure medication. Her symptoms also present occasionally while walking. She finds relief by laying down and resting. She has mild nausea accompanying the dizziness. She has fallen twice this past weekend.  She also complains of increased frequency in her headaches.  She also reports having increased fatigue recently. She thinks her estrogen levels are low and is requesting to get her hormone levels checked. She does not wear her CPAP machine at this time and thinks her sleep apnea is not a factor in her fatigue.    Health Maintenance Due  Topic Date Due   COVID-19 Vaccine (1) Never done   Zoster Vaccines- Shingrix (1 of 2) Never done   COLONOSCOPY (Pts 45-87yr Insurance coverage will need to be confirmed)  Never done   FOOT EXAM  10/04/2018   OPHTHALMOLOGY EXAM  06/06/2019   Pneumococcal Vaccine [id number] old (2 - PCV) 09/14/2019   HEMOGLOBIN A1C  03/21/2021   PAP SMEAR-Modifier  04/05/2021    Past Medical History:  Diagnosis Date   Anemia    iron deficient, microcytic, hypochromic   Anxiety    Chest pain    atypical   Depression    Diabetes mellitus without complication ([facility]    Fatigue    Fatty liver 08/15/2013   Fibroids    uterine   Hypertension    Migraines    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/april of two dominant nodules- benign.  Following for annual thyroid [place]. 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 PPosey Pronto endo   DILATION AND CURETTAGE OF UTERUS  05/21/2011   Procedure: DILATATION AND CURETTAGE (D&C);  Surgeon: VElveria Royals  [facility];  Service: Gynecology;  Laterality: N/A;  dilitation and currettage/endometrial currettings   TUBAL LIGATION  1997   TUBOPLASTY / TUBOTUBAL ANASTOMOSIS  2005    Family History  Problem Relation Age of Onset   Hypertension Mother    Cancer Mother 431      breast   Cancer Father        colon   Stroke Brother        handicapped due to complications from spinal meningitis   Seizures Brother    Asthma Son    Arthritis Maternal Grandmother    Hypertension Maternal Grandmother    Heart attack Maternal Grandmother    Stroke Maternal Grandfather    Hypertension Maternal Uncle     Social History   Socioeconomic History   Marital status: Married    Spouse name: DJaneece Riggers  Number of children: 2   Years of education: Not on  file   Highest education level: Not on file  Occupational History   Occupation: CLAIMS AGENT    Employer: [employer]  Tobacco Use   Smoking status: Never   Smokeless tobacco: Never   Tobacco comments:    never used tobacco  Substance and Sexual Activity   Alcohol use: Yes    Comment: Once a month   Drug use: No   Sexual activity: Yes    Birth control/protection: None, Surgical  Other Topics Concern   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 For anxiety     busPIRone (BUSPAR) 15 MG tablet Take 15 mg by mouth 2 (two) times daily.     EPIPEN 2-PAK 0.3 MG/0.3ML SOAJ injection daily as needed (FOR ALLERGY).      hydrALAZINE (APRESOLINE) 25 MG tablet 1/2 tab twice daily for 1 week, then increase to a full tab twice daily on week two 60 tablet 0   hydrochlorothiazide (HYDRODIURIL) 25 MG tablet Take 1 tablet by mouth once daily 90 tablet 0   sertraline (ZOLOFT) 100 MG tablet Take 100 mg by mouth daily.     valACYclovir (VALTREX) 1000 MG tablet TAKE 2 TABLETS BY MOUTH NOW AND AGAIN IN 12 HOURS 4 tablet 5   valsartan (DIOVAN) 320 MG tablet Take 1 tablet (320 mg total) by mouth daily. 90 tablet 1   No facility-administered medications prior to visit.    Allergies  Allergen Reactions   Amlodipine Shortness Of Breath, Anxiety, Palpitations and Other (See Comments)   Bee Venom Anaphylaxis   Bupropion Shortness Of Breath, Anxiety, Palpitations and Other (See Comments)   Clonidine Derivatives     dizziness   Hydralazine     dizziness   Imitrex [Sumatriptan] Nausea Only   Oxycodone Nausea Only    Review of Systems  Constitutional:  Positive for malaise/fatigue.  Gastrointestinal:  Positive for nausea (with dizziness).  Neurological:  Positive for dizziness and headaches (increased frequnecy).      Objective:    Physical Exam Constitutional:      General: She is not in acute distress.    Appearance: Normal appearance. She is not ill-appearing.  HENT:     Head: Normocephalic and atraumatic.     Right Ear: External ear normal.     Left Ear: External ear normal.  Eyes:     Extraocular Movements: Extraocular movements intact.     Pupils: Pupils are equal, round, and reactive to light.  Cardiovascular:     Rate and  Rhythm: Normal rate and regular rhythm.     Heart sounds: Normal heart sounds. No murmur heard.   No gallop.  Pulmonary:     Effort: Pulmonary effort is normal. No respiratory distress.     Breath sounds: Normal breath sounds. No wheezing or rales.  Skin:    General: Skin is warm and dry.  Neurological:     Mental Status: She is alert and oriented to person, place, and time.     Comments: Mildly positive Dix-Hallpike test bilateral.  Symptoms worse when sitting up from laying down  Psychiatric:        Behavior: Behavior normal.    BP (!) 151/90 (BP Location: Left Arm, Patient Position: Sitting, Cuff Size: Large)   Pulse 80   Temp 98.5 F (36.9 C) (Oral)   Resp 16   Wt 216 lb (98 kg)   LMP 06/11/2013   SpO2 100%   BMI 35.94 kg/m  Wt Readings from Last 3 Encounters:  05/23/21 216 lb (98 kg)  05/03/21 212 lb (96.2 kg)  04/12/21 208 lb (94.3 kg)       Assessment & Plan:   Problem List Items Addressed This Visit       Unprioritized   Essential hypertension    I don't believe the hydralazine is cause for her vertigo. Will not make an changes to her BP meds at this time.       BPV (benign positional vertigo) - Primary    New. Trial of meclizine. She will let me know if symptoms worsen or if symptoms fail to improve.         Meds ordered this encounter  Medications   meclizine (ANTIVERT) 25 MG tablet    Sig: Take 1 tablet (25 mg total) by mouth 3 (three) times daily as needed for dizziness.    Dispense:  30 tablet    Refill:  2    Order Specific Question:   Supervising Provider    Answer:   Penni Homans A [[id number]]    I, Debbrah Alar NP, personally preformed the services described in this documentation.  All medical record entries made by the scribe were at my direction and in my presence.  I have reviewed the chart and discharge instructions (if applicable) and agree that the record reflects my personal performance and is accurate and complete.  05/23/2021   I,Shehryar Baig,acting as a Education administrator for Nance Pear, NP.,have documented all relevant documentation on the behalf of Nance Pear, NP,as directed by  Nance Pear, NP while in the presence of Nance Pear, NP.   Nance Pear, NP

## 2021-05-23 NOTE — Assessment & Plan Note (Signed)
I don't believe the hydralazine is cause for her vertigo. Will not make an changes to her BP meds at this time.

## 2021-06-24 NOTE — Progress Notes (Incomplete)
Cardiology Office Note   Date:  01/01/20  ID:  Michele [name], DOB 1970-10-29, MRN [id number]  PCP:  Debbrah Alar, NP  Cardiologist:   Madelin Rear   No chief complaint on file.    History of Present Illness: Michele [name] is a 50 y.o. female with hypertension, palpitations, chronic chest pain, and OSA here for follow up.  She was initially seen 11/2019 for the evaluation of chest pain.  [name] reports having chest pain for years.  She feels like her chest gets tight and her heart starts racing.  It occurs when sitting at her desk.  She can feel her heart pounding in the ears.  Sometimes it even wakes her up from sleep.  She also notes that when she tries to exert herself even minimally her heart starts to race.She started exercising regularly but hasn't seen improvements in her exertional capacity.  She has a [employer] but cannot participate fully because her heart starts racing too fast.  She previously had a sleep study and was told she had mild sleep apnea and did not require a CPAP machine.  She notes that she has gained some weight since this time.  Her blood pressure at home is typically well-controlled.  In the past she was told that her chest pain was related to anxiety.  However she really does not think it is anxiety.  She takes Xanax, which does not help.  She had heart catheterization in 2013 that showed normal coronaries. She last saw Dr. Burt Knack in 2014 and complained of frequent palpitations. Echo 08/2013 revealed LVEF 50 to 55% with mild mitral regurgitation. She was offered a trial of metoprolol but didn't use it.  [name] was referred for a 7 day Event Monitor that showed no arrhythmias.  Her average heart rate was 103 bpm.  She only wore her heart monitor for 3 hours because it made her anxious.  She was referred for a coronary CT-A that could not be completed due to faster heart rates despite metoprolol, ibavardine and Xanax.     At her last appointment, she continued to have chest pain. She reported shortness of breath and leg pain.  Her blood pressure was high when she went for cardiac CT.  She had been waking up in the middle of the night with her lungs burning, so she was referred to pulmonary.  She admits that she has struggled with anxiety in the past.  She did not tolerate Prozac or Cymbalta and has been uninterested in trying anything else. In 03/2021 she was vacationing in [place] and tested positive for Covid. She noted fever, body aches, muscle pain, and a bad cough. On 04/11/2021 she called the office and reported elevated blood pressure with onset at her Covid infection. Her readings at home ranged from 136-150/90-110, and she developed headache, lightheadedness, diaphoresis, and cough without sputum production. She followed-up with Debbrah Alar, NP 04/12/2021, and her blood pressure was 149/99. At that visit, 320 mg valsartan and 25 mg HCTZ were continued, with a retrial of hydralazine 12.5 mg bid for 1 week, and then 25 mg bid. Lindley Magnus were discontinued. She again followed up with her PCP 05/23/2021 complaining of dizziness, increased headaches and fatigue since beginning her antihypertensives. During one weekend she had 2 falls. Her blood pressure was 151/90. She was given meclizine for benign positional vertigo; no changes made to her BP medications.  Today,  She denies any palpitations, chest pain, or shortness of  breath. No lightheadedness, headaches, syncope, orthopnea, or PND. Also has no lower extremity edema or exertional symptoms.  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 at  [facility]- S/p FNA March/april of two dominant nodules- benign.  Following for annual thyroid US with Dr. Posey Pronto.     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 Posey Pronto- endo   DILATION AND CURETTAGE OF UTERUS  05/21/2011   Procedure: DILATATION AND CURETTAGE (D&C);  Surgeon: Elveria Royals;  Location: [facility];  Service: Gynecology;  Laterality: N/A;  dilitation and currettage/endometrial currettings   TUBAL LIGATION  1997   TUBOPLASTY / TUBOTUBAL ANASTOMOSIS  2005     Current Outpatient Medications  Medication Sig Dispense Refill   ALPRAZolam (XANAX) 1 MG tablet Take 1 mg by mouth as needed. For anxiety     busPIRone (BUSPAR) 15 MG tablet Take 15 mg by mouth 2 (two) times daily.     EPIPEN 2-PAK 0.3 MG/0.3ML SOAJ injection daily as needed (FOR ALLERGY).      hydrALAZINE (APRESOLINE) 25 MG tablet 1/2 tab twice daily for 1 week, then increase to a full tab twice daily on week two 60 tablet 0   hydrochlorothiazide (HYDRODIURIL) 25 MG tablet Take 1 tablet by mouth once daily 90 tablet 0   meclizine (ANTIVERT) 25 MG tablet Take 1 tablet (25 mg total) by mouth 3 (three) times daily as needed for dizziness. 30 tablet 2   sertraline (ZOLOFT) 100 MG tablet Take 100 mg by mouth daily.     valACYclovir (VALTREX) 1000 MG tablet TAKE 2 TABLETS BY MOUTH NOW AND AGAIN IN 12 HOURS 4 tablet 5   valsartan (DIOVAN) 320 MG tablet Take 1 tablet (320 mg total) by mouth daily. 90 tablet 1   No current facility-administered medications for this visit.    Allergies:   Amlodipine, Bee venom, Bupropion, Clonidine derivatives, Hydralazine, Imitrex [sumatriptan], and Oxycodone    Social History:  The patient  reports that she has never smoked. She has never used smokeless tobacco. She reports current alcohol use. She reports that she does not use  drugs.   Family History:  The patient's family history includes Arthritis in her maternal grandmother; Asthma in her son; Cancer in her father; Cancer (age of onset: 14) in her mother; Heart attack in her maternal grandmother; Hypertension in her maternal grandmother, maternal uncle, and mother; Seizures in her brother; Stroke in her brother and maternal grandfather.    ROS:   Please see the history of present illness.  All other systems are reviewed and negative.    PHYSICAL EXAM: VS:  LMP 06/11/2013  , BMI There is no height or weight on file to calculate BMI. GENERAL:  Well appearing HEENT:  Pupils equal round and reactive, fundi not visualized, oral mucosa unremarkable NECK:  No jugular venous distention, waveform within normal limits, carotid upstroke  brisk and symmetric, no bruits LUNGS:  Clear to auscultation bilaterally HEART:  RRR.  PMI not displaced or sustained,S1 and S2 within normal limits, no S3, no S4, no clicks, no rubs, no murmurs ABD:  Flat, positive bowel sounds normal in frequency in pitch, no bruits, no rebound, no guarding, no midline pulsatile mass, no hepatomegaly, no splenomegaly EXT:  2 plus pulses throughout, no edema, no cyanosis no clubbing SKIN:  No rashes no nodules NEURO:  Cranial nerves II through XII grossly intact, motor grossly intact throughout PSYCH:  Cognitively intact, oriented to person place and time    EKG:   06/27/2021: Sinus ***. Rate *** bpm. 01/01/2020: Sinus rhythm.  91 bpm.  Nonspecific ST changes. 12/05/2019: sinus rhythm.  Rate 97 bpm.  Exercise Myoview 02/12/2020: The left ventricular ejection fraction is hyperdynamic (>65%). Nuclear stress EF: 66%. Blood pressure demonstrated a hypertensive response to exercise. There was no ST segment deviation noted during stress. The study is normal. This is a low risk study.   Normal stress nuclear study with no ischemia or infarction.  Gated ejection fraction 66% with normal wall  motion.  Echo 01/21/2020:  1. Left ventricular ejection fraction, by estimation, is 60 to 65%. The  left ventricle has normal function. The left ventricle has no regional  wall motion abnormalities. There is mild left ventricular hypertrophy.  Left ventricular diastolic parameters  are consistent with Grade I diastolic dysfunction (impaired relaxation).   2. Right ventricular systolic function is hyperdynamic. The right  ventricular size is normal. Tricuspid regurgitation signal is inadequate  for assessing PA pressure.   3. The mitral valve is grossly normal. Trivial mitral valve  regurgitation.   4. The aortic valve is tricuspid. Aortic valve regurgitation is not  visualized.   5. The inferior vena cava is normal in size with greater than 50%  respiratory variability, suggesting right atrial pressure of 3 mmHg.    7 Day Event Monitor 12/2019:   Quality: Fair.  Baseline artifact. Predominant rhythm: sinus tachycardia Average heart rate: 103 bpm Max heart rate: 172 bpm Min heart rate: 76 bpm   No arrhythmias   Echo 08/2013: Study Conclusions   - Left ventricle: The cavity size was normal. Wall thickness    was normal. Systolic function was normal. The estimated    ejection fraction was in the range of 50% to 55%. Wall    motion was normal; there were no regional wall motion    abnormalities. Left ventricular diastolic function    parameters were normal.  - Mitral valve: Mild regurgitation.  - Atrial septum: No defect or patent foramen ovale was    identified.   Renal artery Doppler 12/25/11: normal renal arteries  Recent Labs: 09/21/2020: ALT 18; Hemoglobin 14.0; Platelets 194.0; TSH 1.13 04/12/2021: BUN 18; Creatinine, Ser 0.97; Potassium 4.6; Sodium 141    Lipid Panel    Component Value Date/Time   CHOL 201 (H) 09/21/2020 1541   TRIG 113.0 09/21/2020 1541   HDL 39.30 09/21/2020 1541   CHOLHDL 5 09/21/2020 1541   VLDL 22.6 09/21/2020 1541   LDLCALC 139 (H)  09/21/2020 1541      Wt Readings from Last 3 Encounters:  05/23/21 216 lb (98 kg)  05/03/21 212 lb (96.2 kg)  04/12/21 208 lb (94.3 kg)      ASSESSMENT AND PLAN: No problem-specific Assessment & Plan notes found for this encounter.  # Tachycardia: [name] seems to have an exaggerated heart rate response to exercise.  She  also has tachycardia at rest.  She only wore her monitor for 3 hours because it caused her to be anxious.  I suspect that this is a big part of her problem.  She is extremely anxious and tense on interview today.  We discussed the fact that she really needs to be treated for her anxiety.  She is going to think by this and talk with her provider.  She did not tolerate medications in the past.  We will get a echocardiogram to ensure that she does not have any evidence of structural heart disease or tachycardia mediated cardiomyopathy.  We will also start metoprolol 25 mg twice daily.  This may also help with her anxiety but is not the ultimate solution. I am suspicious that OSA may be contributing.  It has been a long time since her last sleep study.  She has episodes of tachycardia or profound bradycardia overnight we will consider repeating her sleep study.  She already has the appropriate lab testing and it was unremarkable.  # Chest pain:  [name] has chest pain that does not seem to be cardiac.  She had a CT PE protocol that was negative for PE and did not demonstrate any evidence of coronary plaque.  She was unable to get the coronary CTA due to tachycardia despite metoprolol and ivabradine.  We will get an exercise Myoview instead.  She understands not to take the metoprolol prior to her stress test.  # Essential hypertension:   Blood pressure was poorly controlled here but she reports that it is well controlled at home.  She is anxious about this appointment.  Adding metoprolol as above.  Continue hydrochlorothiazide and losartan for  now.   Current medicines are reviewed at length with the patient today.  The patient does not have concerns regarding medicines.  The following changes have been made:  no change  Labs/ tests ordered today include:   No orders of the defined types were placed in this encounter.     Disposition:   FU with Michele C. Oval Linsey, MD, [facility] in ***1 month.  I,Mathew Stumpf,acting as a Education administrator for Skeet Latch, MD.,have documented all relevant documentation on the behalf of Skeet Latch, MD,as directed by  Skeet Latch, MD while in the presence of Skeet Latch, MD.  ***  Signed, Michele C. Oval Linsey, MD, [facility]  06/24/2021 10:30 AM    [place]

## 2021-06-27 ENCOUNTER — Ambulatory Visit (HOSPITAL_BASED_OUTPATIENT_CLINIC_OR_DEPARTMENT_OTHER): Payer: BC Managed Care – PPO | Admitting: Cardiovascular Disease

## 2021-07-05 ENCOUNTER — Other Ambulatory Visit: Payer: Self-pay | Admitting: General Practice

## 2021-07-13 ENCOUNTER — Other Ambulatory Visit: Payer: Self-pay | Admitting: Family

## 2021-08-09 ENCOUNTER — Ambulatory Visit: Payer: BC Managed Care – PPO | Admitting: Family

## 2021-08-09 NOTE — Progress Notes (Incomplete)
Subjective:   By signing my name below, I, Lyric Barr-McArthur, attest that this documentation has been prepared under the direction and in the presence of Debbrah Alar, NP, 08/09/2021   Patient ID: Michele [name], female    DOB: June 17, 1971, 50 y.o.   MRN: [id number]  No chief complaint on file.   HPI Patient is in today for an office visit.    Health Maintenance Due  Topic Date Due   COVID-19 Vaccine (1) Never done   Zoster Vaccines- Shingrix (1 of 2) Never done   COLONOSCOPY (Pts 45-58yrs Insurance coverage will need to be confirmed)  Never done   FOOT EXAM  10/04/2018   OPHTHALMOLOGY EXAM  06/06/2019   Pneumococcal Vaccine 66-53 Years old (2 - PCV) 09/14/2019   HEMOGLOBIN A1C  03/21/2021   PAP SMEAR-Modifier  04/05/2021   MAMMOGRAM  07/13/2021    Past Medical History:  Diagnosis Date   Anemia    iron deficient, microcytic, hypochromic   Anxiety    Chest pain    atypical   Depression    Diabetes mellitus without complication ([place])    Fatigue    Fatty liver 08/15/2013   Fibroids    uterine   Hypertension    Migraines    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 Posey Pronto at [facility]- S/p FNA March/april of two dominant nodules- benign.  Following for annual thyroid US with Dr. Posey Pronto.     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 Posey Pronto- endo   DILATION AND CURETTAGE OF UTERUS  05/21/2011   Procedure: DILATATION AND CURETTAGE (D&C);  Surgeon: Elveria Royals;  Location: [facility];  Service: Gynecology;  Laterality: N/A;  dilitation and currettage/endometrial currettings   TUBAL LIGATION  1997   TUBOPLASTY / TUBOTUBAL ANASTOMOSIS  2005    Family History  Problem Relation Age of Onset   Hypertension Mother    Cancer Mother 7       breast   Cancer Father         colon   Stroke Brother        handicapped due to complications from spinal meningitis   Seizures Brother    Asthma Son    Arthritis Maternal Grandmother    Hypertension Maternal Grandmother    Heart attack Maternal Grandmother    Stroke Maternal Grandfather    Hypertension Maternal Uncle     Social History   Socioeconomic History   Marital status: Married    Spouse name: Warehouse manager   Number of children: 2   Years of education: Not on file   Highest education level: Not on file  Occupational History   Occupation: CLAIMS AGENT    Employer: [employer]  Tobacco Use   Smoking status: Never   Smokeless tobacco: Never   Tobacco comments:    never used tobacco  Substance and Sexual Activity   Alcohol use: Yes    Comment: Once a month   Drug use: No   Sexual activity: Yes    Birth control/protection: None, Surgical  Other Topics Concern   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 For anxiety     busPIRone (BUSPAR) 15 MG tablet Take 15 mg by mouth 2 (two) times daily.     EPIPEN 2-PAK 0.3 MG/0.3ML SOAJ injection daily as needed (FOR ALLERGY).      hydrALAZINE (APRESOLINE) 25 MG tablet 1/2 tab twice daily for 1 week, then increase to a full tab twice daily on week two 60 tablet 0   hydrochlorothiazide (HYDRODIURIL) 25 MG tablet Take 1 tablet by mouth once daily 90 tablet 0   meclizine (ANTIVERT) 25 MG tablet Take 1  tablet (25 mg total) by mouth 3 (three) times daily as needed for dizziness. 30 tablet 2   sertraline (ZOLOFT) 100 MG tablet Take 100 mg by mouth daily.     valACYclovir (VALTREX) 1000 MG tablet TAKE 2 TABLETS BY MOUTH NOW AND AGAIN IN 12 HOURS 4 tablet 0   valsartan (DIOVAN) 160 MG tablet TAKE 1 TABLET BY MOUTH ONCE DAILY . APPOINTMENT REQUIRED FOR FUTURE REFILLS 90 tablet 0   valsartan (DIOVAN) 320 MG tablet Take 1 tablet (320 mg total) by mouth daily. 90 tablet 1   No facility-administered medications prior to visit.    Allergies  Allergen Reactions   Amlodipine Shortness Of Breath, Anxiety, Palpitations and Other (See Comments)   Bee Venom Anaphylaxis   Bupropion Shortness Of Breath, Anxiety, Palpitations and Other (See Comments)   Clonidine Derivatives     dizziness   Hydralazine     dizziness   Imitrex [Sumatriptan] Nausea Only   Oxycodone Nausea Only    ROS     Objective:    Physical Exam Constitutional:      General: She is not in acute distress.    Appearance: Normal appearance. She is not ill-appearing.  HENT:     Head: Normocephalic and atraumatic.     Right Ear: External ear normal.     Left Ear: External ear normal.  Eyes:     Extraocular Movements: Extraocular movements intact.     Pupils: Pupils are equal, round, and reactive to light.  Cardiovascular:     Rate and Rhythm: Normal rate and regular rhythm.     Heart sounds: Normal heart sounds. No murmur heard.   No gallop.  Pulmonary:     Effort: Pulmonary effort is normal. No respiratory distress.     Breath sounds: Normal breath sounds. No wheezing or rales.  Lymphadenopathy:     Cervical: No cervical adenopathy.  Skin:    General: Skin is warm and dry.  Neurological:     Mental Status: She is alert and oriented to person, place, and time.  Psychiatric:        Behavior: Behavior normal.        Judgment: Judgment normal.    LMP 06/11/2013  Wt Readings from Last 3 Encounters:  05/23/21 216 lb  (98 kg)  05/03/21 212 lb (96.2 kg)  04/12/21 208 lb (94.3 kg)       Assessment & Plan:   Problem List Items Addressed  This Visit   None  No orders of the defined types were placed in this encounter.   I, Debbrah Alar, NP, personally preformed the services described in this documentation.  All medical record entries made by the scribe were at my direction and in my presence.  I have reviewed the chart and discharge instructions (if applicable) and agree that the record reflects my personal performance and is accurate and complete. 08/09/2021  I,Lyric Barr-McArthur,acting as a Education administrator for Nance Pear, NP.,have documented all relevant documentation on the behalf of Nance Pear, NP,as directed by  Nance Pear, NP while in the presence of Nance Pear, NP.  Lyric Barr-McArthur

## 2021-08-15 ENCOUNTER — Encounter (HOSPITAL_BASED_OUTPATIENT_CLINIC_OR_DEPARTMENT_OTHER): Payer: Self-pay | Admitting: Cardiovascular Disease

## 2021-08-15 ENCOUNTER — Other Ambulatory Visit: Payer: Self-pay

## 2021-08-15 ENCOUNTER — Ambulatory Visit (HOSPITAL_BASED_OUTPATIENT_CLINIC_OR_DEPARTMENT_OTHER): Payer: BC Managed Care – PPO | Admitting: Cardiovascular Disease

## 2021-08-15 DIAGNOSIS — I10 Essential (primary) hypertension: Secondary | ICD-10-CM | POA: Diagnosis not present

## 2021-08-15 DIAGNOSIS — R0789 Other chest pain: Secondary | ICD-10-CM

## 2021-08-15 MED ORDER — METOPROLOL SUCCINATE ER 25 MG PO TB24: 25.0000 mg | ORAL_TABLET | Freq: Every day | ORAL | 3 refills | Status: DC

## 2021-08-15 MED ORDER — VALSARTAN 160 MG PO TABS: ORAL_TABLET | ORAL | 3 refills | Status: DC

## 2021-08-15 NOTE — Assessment & Plan Note (Signed)
Adding metoprolol 25 mg as above.  This should help with her tachycardia.

## 2021-08-15 NOTE — Progress Notes (Signed)
Cardiology Office Note   Date:  01/01/20  ID:  Michele [name], DOB 14-Jun-1971, MRN [id number]  PCP:  Debbrah Alar, NP  Cardiologist:   Skeet Latch, MD   No chief complaint on file.    History of Present Illness: Michele [name] is a 50 y.o. female with hypertension, palpitations, chronic chest pain, and OSA here for follow up.  She was initially seen 11/2019 for the evaluation of chest pain.  [name] reports having chest pain for years.  She feels like her chest gets tight and her heart starts racing.  It occurs when sitting at her desk.  She can feel her heart pounding in the ears.  Sometimes it even wakes her up from sleep.  She also notes that when she tries to exert herself even minimally her heart starts to race.She started exercising regularly but hasn't seen improvements in her exertional capacity.  She has a [employer] but cannot participate fully because her heart starts racing too fast.  She previously had a sleep study and was told she had mild sleep apnea and did not require a CPAP machine.  She notes that she has gained some weight since this time.  Her blood pressure at home is typically well-controlled.  In the past she was told that her chest pain was related to anxiety.  However she really does not think it is anxiety.  She takes Xanax, which does not help.  She had heart catheterization in 2013 that showed normal coronaries. She last saw Dr. Burt Knack in 2014 and complained of frequent palpitations. Echo 08/2013 revealed LVEF 50 to 55% with mild mitral regurgitation. She was offered a trial of metoprolol but didn't use it.  [name] was referred for a 7 day Event Monitor that showed no arrhythmias.  Her average heart rate was 103 bpm.  She only wore her heart monitor for 3 hours because it made her anxious.  She was referred for a coronary CT-A that could not be completed due to faster heart rates despite metoprolol, ibavardine and  Xanax. She was referred for a nuclear stress test 02/2020 which revealed LVEF 66% and no ischemia. Her blood pressure was poorly controlled. She was switched from losartan to valsartan. She was started on metoprolol due to tachycardia.   She followed up with her PCP and reported frequent falls. She underwent an MRI that was concerning for small vessel disease vs demyelinating disease. She was referred to neurology. She called the office because her blood pressures were elevated 03/2021 after having COVID. Her primary care doctor started her on hydralazine 04/2021. She was seen in September and reported dizziness. Her symptoms were thought to be due to vertigo.   Today, she had been doing well. However, her R shoulder pain has ben bothering her. She describes the pain as an ache and began about 2 weeks ago. Palpation makes the pain worse. She also endorses continued dizziness and shortness of breath. At home, her blood pressures readings ar on average 160/100 and is higher in the morning. Routinely, she takes her blood pressure medicine at night. Prior to her calling the office, the low blood pressure readings had occurred multiple times with associated dizziness and shortness of breath. Her blood pressure reading during the call was 90/50s. She was told to discontinue metoprolol and reduce valsartan. Since the medicine changes, she endorses palpitations. She follows-up with neurology for migraines and headaches. She has not been exercising and reports feeling chest tightness when she  walks. Recently, she has gained weight. She will wake up at night gasping for air because of sleep apnea. However, she does not use a CPAP mask because she feels she cannot breath while wearing the mask. She denies any chest pain, syncope, PND, or lower extremity edema.  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 Posey Pronto at [facility]- S/p FNA March/april of two dominant nodules- benign.  Following for annual thyroid US with Dr. Posey Pronto.     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 Posey Pronto- endo   DILATION AND CURETTAGE OF UTERUS  05/21/2011   Procedure: DILATATION AND CURETTAGE (D&C);  Surgeon: Elveria Royals;  Location: [facility];  Service: Gynecology;  Laterality: N/A;  dilitation and currettage/endometrial currettings   TUBAL LIGATION  1997   TUBOPLASTY / TUBOTUBAL ANASTOMOSIS  2005     Current Outpatient Medications  Medication Sig Dispense Refill   ALPRAZolam (XANAX) 1 MG tablet Take 1 mg by mouth as needed. For anxiety     EPIPEN 2-PAK 0.3 MG/0.3ML SOAJ injection daily as needed (FOR ALLERGY).      hydrochlorothiazide (HYDRODIURIL) 25 MG tablet Take 1 tablet by mouth once daily 90 tablet 0   meclizine (ANTIVERT) 25 MG tablet Take 1 tablet (25 mg total) by mouth 3 (three) times daily as needed for dizziness. 30 tablet 2   metoprolol succinate (TOPROL XL) 25 MG 24 hr tablet Take 1 tablet (25 mg total) by mouth daily. 90 tablet 3   sertraline (ZOLOFT) 100 MG tablet Take 100 mg by mouth daily.     valACYclovir (VALTREX) 1000 MG tablet TAKE 2 TABLETS BY MOUTH NOW AND AGAIN IN 12 HOURS 4 tablet 0   valsartan (DIOVAN) 160 MG tablet Take 80 mg(1/2 tablet) in the morning and 160 mg(1 tablet) at bedtime. 135 tablet 3   No current facility-administered medications for this visit.    Allergies:   Amlodipine, Bee venom, Bupropion, Clonidine derivatives, Hydralazine, Imitrex [sumatriptan], and Oxycodone    Social History:  The patient  reports that she  has never smoked. She has never used smokeless tobacco. She reports current alcohol use. She reports that she does not use drugs.   Family History:  The patient's family history includes Arthritis in her maternal grandmother; Asthma in her son; Cancer in her father; Cancer (age of onset: 59) in her mother; Heart attack in her maternal grandmother; Hypertension in her maternal grandmother, maternal uncle, and mother; Seizures in her brother; Stroke in her brother and maternal grandfather.    ROS:  Please see the history of present illness.   (+) R shoulder pain (+) Dizziness (+) Shortness of breath (+) Palpitations (+) Headaches (+) Orthopnea All other systems are reviewed and negative.    PHYSICAL EXAM: VS:  BP (!) 160/100   Pulse 100   Ht 5\' 5"  (1.651 m)   Wt 219 lb (  99.3 kg)   LMP 06/11/2013   SpO2 96%   BMI 36.44 kg/m  , BMI Body mass index is 36.44 kg/m. GENERAL:  Well appearing HEENT:  Pupils equal round and reactive, fundi not visualized, oral mucosa unremarkable NECK:  No jugular venous distention, waveform within normal limits, carotid upstroke brisk and symmetric, no bruits LUNGS:  Clear to auscultation bilaterally HEART:  RRR.  PMI not displaced or sustained,S1 and S2 within normal limits, no S3, no S4, no clicks, no rubs, no murmurs ABD:  Flat, positive bowel sounds normal in frequency in pitch, no bruits, no rebound, no guarding, no midline pulsatile mass, no hepatomegaly, no splenomegaly EXT:  2 plus pulses throughout, no edema, no cyanosis no clubbing SKIN:  No rashes no nodules NEURO:  Cranial nerves II through XII grossly intact, motor grossly intact throughout PSYCH:  Cognitively intact, oriented to person place and time  EKG: 08/15/21: Sinus rhythm, rate 100 bpm 01/01/2020: Sinus rhythm.  91 bpm.  Nonspecific ST changes. 12/05/2019 sinus rhythm.  Rate 97 bpm.  Myocardial Stress Test 02/12/20 The left ventricular ejection fraction is hyperdynamic (>65%). Nuclear  stress EF: 66%. Blood pressure demonstrated a hypertensive response to exercise. There was no ST segment deviation noted during stress. The study is normal. This is a low risk study. Normal stress nuclear study with no ischemia or infarction.  Gated ejection fraction 66% with normal wall motion.  Echo 01/21/20  1. Left ventricular ejection fraction, by estimation, is 60 to 65%. The  left ventricle has normal function. The left ventricle has no regional  wall motion abnormalities. There is mild left ventricular hypertrophy.  Left ventricular diastolic parameters  are consistent with Grade I diastolic dysfunction (impaired relaxation).   2. Right ventricular systolic function is hyperdynamic. The right  ventricular size is normal. Tricuspid regurgitation signal is inadequate  for assessing PA pressure.   3. The mitral valve is grossly normal. Trivial mitral valve  regurgitation.   4. The aortic valve is tricuspid. Aortic valve regurgitation is not  visualized.   5. The inferior vena cava is normal in size with greater than 50%  respiratory variability, suggesting right atrial pressure of 3 mmHg.    7 Day Event Monitor 12/2019: Quality: Fair.  Baseline artifact. Predominant rhythm: sinus tachycardia Average heart rate: 103 bpm Max heart rate: 172 bpm Min heart rate: 76 bpm   No arrhythmias   Echo 08/2013: Study Conclusions  - Left ventricle: The cavity size was normal. Wall thickness    was normal. Systolic function was normal. The estimated    ejection fraction was in the range of 50% to 55%. Wall    motion was normal; there were no regional wall motion    abnormalities. Left ventricular diastolic function    parameters were normal.  - Mitral valve: Mild regurgitation.  - Atrial septum: No defect or patent foramen ovale was    identified.   Renal artery Doppler 12/25/11: normal renal arteries  Recent Labs: 09/21/2020: ALT 18; Hemoglobin 14.0; Platelets 194.0; TSH  1.13 04/12/2021: BUN 18; Creatinine, Ser 0.97; Potassium 4.6; Sodium 141    Lipid Panel    Component Value Date/Time   CHOL 201 (H) 09/21/2020 1541   TRIG 113.0 09/21/2020 1541   HDL 39.30 09/21/2020 1541   CHOLHDL 5 09/21/2020 1541   VLDL 22.6 09/21/2020 1541   LDLCALC 139 (H) 09/21/2020 1541      Wt Readings from Last 3 Encounters:  08/15/21 219 lb (99.3 kg)  05/23/21  216 lb (98 kg)  05/03/21 212 lb (96.2 kg)      ASSESSMENT AND PLAN:  Essential hypertension Blood pressure is poorly controlled.  She noted that it was getting low on valsartan 320 mg.  We will keep the 160 mg in the evenings and at 80 mg in the mornings.  We will also have her start taking her hydrochlorothiazide in the morning.  Add metoprolol succinate 25 mg for tachycardia, which should also help with her blood pressure little bit.  Keep checking her blood pressures and bring a list to follow-up.  Palpitations Adding metoprolol 25 mg as above.  This should help with her tachycardia.  Atypical chest pain She is no longer having chest pain.  Lexiscan Myoview was negative in 2021.  Her pain is nonexertional and in her right shoulder.  No further ischemic evaluation at this time.   Current medicines are reviewed at length with the patient today.  The patient does not have concerns regarding medicines.  The following changes have been made:  no change  Labs/ tests ordered today include:   Orders Placed This Encounter  Procedures   EKG 12-Lead    Disposition:   FU with APP/PharmD in 1 month FU with Ricke Kimoto C. Oval Linsey, MD, [facility] in 6 months   I,Mykaella Javier,acting as a scribe for Skeet Latch, MD.,have documented all relevant documentation on the behalf of Skeet Latch, MD,as directed by  Skeet Latch, MD while in the presence of Skeet Latch, MD.  I, Carrabelle Oval Linsey, MD have reviewed all documentation for this visit.  The documentation of the exam, diagnosis, procedures, and  orders on 08/15/2021 are all accurate and complete.   Signed, Chiyo Fay C. Oval Linsey, MD, [facility]  08/15/2021 12:40 PM    [place]

## 2021-08-15 NOTE — Assessment & Plan Note (Signed)
She is no longer having chest pain.  Lexiscan Myoview was negative in 2021.  Her pain is nonexertional and in her right shoulder.  No further ischemic evaluation at this time.

## 2021-08-15 NOTE — Patient Instructions (Signed)
Medication Instructions:  START- Metoprolol Succinate 25 mg by mouth daily TAKE- Hydrochlorothiazide in the morning TAKE- Valsartan 80 mg by mouth in the morning and 160 mg by mouth at bedtime.  *If you need a refill on your cardiac medications before your next appointment, please call your pharmacy*   Lab Work: None Ordered   Testing/Procedures: None Ordered   Follow-Up: At [facility], you and your health needs are our priority.  As part of our continuing mission to provide you with exceptional heart care, we have created designated Provider Care Teams.  These Care Teams include your primary Cardiologist (physician) and Advanced Practice Providers (APPs -  Physician Assistants and Nurse Practitioners) who all work together to provide you with the care you need, when you need it.  We recommend signing up for the patient portal called "MyChart".  Sign up information is provided on this After Visit Summary.  MyChart is used to connect with patients for Virtual Visits (Telemedicine).  Patients are able to view lab/test results, encounter notes, upcoming appointments, etc.  Non-urgent messages can be sent to your provider as well.   To learn more about what you can do with MyChart, go to [email or web address].    Your next appointment:   6 month(s)  The format for your next appointment:   In Person  Provider:   Skeet Latch, MD    Other Instructions 1 Month with Laurann Montana

## 2021-08-15 NOTE — Assessment & Plan Note (Signed)
Blood pressure is poorly controlled.  She noted that it was getting low on valsartan 320 mg.  We will keep the 160 mg in the evenings and at 80 mg in the mornings.  We will also have her start taking her hydrochlorothiazide in the morning.  Add metoprolol succinate 25 mg for tachycardia, which should also help with her blood pressure little bit.  Keep checking her blood pressures and bring a list to follow-up.

## 2021-09-15 ENCOUNTER — Ambulatory Visit (HOSPITAL_BASED_OUTPATIENT_CLINIC_OR_DEPARTMENT_OTHER): Payer: BC Managed Care – PPO | Admitting: Family

## 2021-09-26 ENCOUNTER — Ambulatory Visit: Payer: BC Managed Care – PPO | Admitting: Family Medicine

## 2021-09-26 VITALS — BP 134/80 | HR 99 | Temp 98.3°F | Resp 18 | Ht 65.0 in | Wt 215.4 lb

## 2021-09-26 DIAGNOSIS — Z130 Encounter for screening for diseases of the blood and blood-forming organs and certain disorders involving the immune mechanism: Secondary | ICD-10-CM

## 2021-09-26 DIAGNOSIS — R631 Polydipsia: Secondary | ICD-10-CM

## 2021-09-26 DIAGNOSIS — Z1329 Encounter for screening for other suspected endocrine disorder: Secondary | ICD-10-CM | POA: Diagnosis not present

## 2021-09-26 DIAGNOSIS — E1165 Type 2 diabetes mellitus with hyperglycemia: Secondary | ICD-10-CM | POA: Diagnosis not present

## 2021-09-26 LAB — GLUCOSE, POCT (MANUAL RESULT ENTRY): POC Glucose: 361 mg/dl — AB (ref 70–99)

## 2021-09-26 MED ORDER — PEN NEEDLES 30G X 5 MM MISC: 1 refills | Status: AC

## 2021-09-26 MED ORDER — BLOOD GLUCOSE METER KIT: PACK | 0 refills | Status: DC

## 2021-09-26 MED ORDER — METFORMIN HCL 500 MG PO TABS: 500.0000 mg | ORAL_TABLET | Freq: Two times a day (BID) | ORAL | 3 refills | Status: DC

## 2021-09-26 MED ORDER — BASAGLAR TEMPO PEN 100 UNIT/ML ~~LOC~~ SOPN: 10.0000 [IU] | PEN_INJECTOR | Freq: Every day | SUBCUTANEOUS | 0 refills | Status: DC

## 2021-09-26 NOTE — Patient Instructions (Signed)
It was good to see you today- I am afraid your mild diabetes appears to have gotten much worse Start on metformin once a day- increase to twice a day after one week.  The main side effect is loose stools/ gas Drink plenty of water and limits carbs/ sugars  You got 10 units of insulin in the office Starting tomorrow give a dose of insulin once a day; check your blood sugars fasting in the am Start with 10 units of insulin As long as your fasting glucose is over 150 increase insulin by 2 units every 2 days Let me know if/ when you get to 20 units  I will be in touch with your labs!

## 2021-09-26 NOTE — Progress Notes (Addendum)
[facility] at [facility] [address], [place], [place] [address] [phone]  Date:  09/26/2021   Name:  Michele [name]   DOB:  Jun 26, 1971   MRN:  [id number]  PCP:  Debbrah Alar, NP    Chief Complaint: Hyperglycemia (pt is not diabetic, not on diabetic meds but blood sugar has been as high as 333 and has recently been thirsty and dizzy. Pt also says the lights loked like flower blossoms when she was driving the other night, )   History of Present Illness:  Michele [name] is a 51 y.o. very pleasant female patient who presents with the following:  Patient called in today with concern of fatigue, polydipsia and noting blood sugar of about 330 when she checked at home on a family members meter History of hypertension, palpitations, chronic chest pain, sleep apnea Primary patient of Debbrah Alar, I have not seen her myself previously Diabetes is listed on her problem list, however it appears her recent A1c's have been normal or in the prediabetes range  Her last A1c was about a year ago- ok  Lab Results  Component Value Date   HGBA1C 5.6 09/21/2020   She notes that an uncle has IDDM Her mother has DM as well She has noted sx of urinary frequency, thirst, fatigue, vision changes She has noted sx for about 4-6 weeks -looking back, perhaps longer She was on metformin in the past- not taking now   She is not losing weight  Wt Readings from Last 3 Encounters:  09/26/21 215 lb 6.4 oz (97.7 kg)  08/15/21 219 lb (99.3 kg)  05/23/21 216 lb (98 kg)     Alprazolam as needed Zoloft HCTZ 25 Toprol-XL 25 Diovan  Patient Active Problem List   Diagnosis Date Noted   Obesity 05/05/2021   Vitamin D deficiency 01/04/2015   Fibromyalgia 11/04/2014   BPV (benign positional vertigo) 11/04/2014   Depression 11/04/2014   Migraine 10/01/2014   Fatigue 03/02/2014   Preventative health care 10/09/2013    Fatty liver 08/15/2013   Atypical chest pain 05/09/2013   Diabetes type 2, controlled ([place]) 07/11/2012   Anxiety and depression 11/13/2011   OSA (obstructive sleep apnea) 10/31/2011   Palpitations 12/13/2010   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 Posey Pronto at [facility]- S/p FNA March/april of two dominant nodules- benign.  Following for annual thyroid US with Dr. Posey Pronto.     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 Posey Pronto- endo   DILATION AND CURETTAGE OF UTERUS  05/21/2011   Procedure: DILATATION AND CURETTAGE (D&C);  Surgeon: Elveria Royals;  Location: [facility];  Service: Gynecology;  Laterality: N/A;  dilitation and currettage/endometrial currettings   TUBAL LIGATION  1997   TUBOPLASTY / TUBOTUBAL ANASTOMOSIS  2005    Social History   Tobacco Use   Smoking status: Never   Smokeless tobacco: Never   Tobacco comments:    never used tobacco  Substance Use Topics   Alcohol use: Yes    Comment: Once a month   Drug use: No    Family History  Problem Relation Age of Onset   Hypertension Mother    Cancer Mother 57       breast   Cancer Father        colon   Stroke Brother        handicapped due to complications from spinal meningitis   Seizures Brother    Asthma Son    Arthritis Maternal Grandmother    Hypertension  Maternal Grandmother    Heart attack Maternal Grandmother    Stroke Maternal Grandfather    Hypertension Maternal Uncle     Allergies  Allergen Reactions   Amlodipine Shortness Of Breath, Anxiety, Palpitations and Other (See Comments)   Bee Venom Anaphylaxis   Bupropion Shortness Of Breath, Anxiety, Palpitations and Other (See Comments)   Clonidine Derivatives     dizziness   Hydralazine     dizziness   Imitrex [Sumatriptan] Nausea Only   Oxycodone Nausea Only    Medication list has been reviewed and updated.  Current Outpatient Medications on File Prior to Visit  Medication Sig Dispense Refill   ALPRAZolam (XANAX) 1 MG tablet Take 1 mg by mouth as needed. For anxiety     EPIPEN 2-PAK 0.3 MG/0.3ML SOAJ injection daily as needed (FOR ALLERGY).      hydrochlorothiazide (HYDRODIURIL) 25 MG tablet Take 1 tablet by mouth once daily 90 tablet 0   meclizine (ANTIVERT) 25 MG tablet Take 1 tablet (25 mg total) by mouth 3 (three) times daily as needed for dizziness. 30 tablet 2   metoprolol succinate (TOPROL XL) 25 MG 24 hr tablet Take 1 tablet (25 mg total) by mouth daily. 90 tablet 3   sertraline (ZOLOFT) 100 MG tablet Take 100 mg by mouth daily.     valACYclovir (VALTREX) 1000 MG tablet TAKE 2 TABLETS BY MOUTH NOW AND AGAIN IN 12 HOURS 4 tablet 0   valsartan (DIOVAN) 160 MG tablet Take 80 mg(1/2 tablet) in the morning and 160 mg(1 tablet) at bedtime. 135 tablet 3   [DISCONTINUED] buPROPion (WELLBUTRIN XL) 150 MG 24 hr tablet Take 2 tablets (300 mg total) by mouth daily. 60 tablet 1   No current facility-administered medications on file prior to visit.    Review of Systems:  As per HPI- otherwise negative.   Physical Examination: Vitals:   09/26/21 1458  BP: 134/80  Pulse: 99  Resp: 18  Temp: 98.3 F (36.8 C)  SpO2: 98%   Vitals:   09/26/21 1458  Weight: 215 lb 6.4 oz (97.7 kg)  Height: 5' 5"  (1.651 m)   Body mass index is 35.84 kg/m. Ideal Body Weight: Weight in  (lb) to have BMI = 25: 149.9  GEN: no acute distress.  Obese, otherwise looks well HEENT: Atraumatic, Normocephalic.  Ears and Nose: No external deformity. CV: RRR, No M/G/R. No JVD. No thrill. No extra heart sounds. PULM: CTA B, no wheezes, crackles, rhonchi. No retractions. No resp. distress. No accessory muscle use. ABD: S, NT, ND EXTR: No c/c/e PSYCH: Normally interactive. Conversant.   Basaglar U-100  10 units given  today  Teaching session done regarding how to use self injector pen  Results for orders placed or performed in visit on 09/26/21  POCT Glucose (CBG)  Result Value Ref Range   POC Glucose 361 (A) 70 - 99 mg/dl    Assessment and Plan: Type 2 diabetes mellitus with hyperglycemia, without long-term current use of insulin (HCC) - Plan: Comprehensive metabolic panel, Hemoglobin A1c, metFORMIN (GLUCOPHAGE) 500 MG tablet, Insulin Glargine w/ Trans Port (BASAGLAR TEMPO PEN) 100 UNIT/ML SOPN, blood glucose meter kit and supplies  Excessive thirst - Plan: POCT Glucose (CBG)  Screening, deficiency anemia, iron - Plan: CBC  Screening for thyroid disorder - Plan: TSH  Patient seen today with sudden worsening of her type 2 diabetes.  Blood sugar in clinic today 361 We will have her start on metformin 500, increase to twice daily after week Gave her a sample pen of Basaglar, start on 10 units and titrate as per patient directions Hopefully she will not need long-term insulin; I have advised her we will use this for now while we start oral medications and get more lab work  [employer] over [employer] recommendations  It was good to see you today- I am afraid your mild diabetes appears to have gotten much worse Start on metformin once a day- increase to twice a day after one week.  The main side effect is loose stools/ gas Drink plenty of water and limits carbs/ sugars  You got 10 units of insulin in the office Starting tomorrow give a dose of insulin once a day; check your blood  sugars fasting in the am Start with 10 units of insulin As long as your fasting glucose is over 150 increase insulin by 2 units every 2 days Let me know if/ when you get to 20 units  I will be in touch with your labs!     Signed Lamar Blinks, MD  Addendum 1/17, received labs as below.  Message to patient  Results for orders placed or performed in visit on 09/26/21  CBC  Result Value Ref Range   WBC 8.2 4.0 - 10.5 K/uL   RBC 5.15 (H) 3.87 - 5.11 Mil/uL   Platelets 220.0 150.0 - 400.0 K/uL   Hemoglobin 14.3 12.0 - 15.0 g/dL   HCT 43.0 36.0 - 46.0 %   MCV 83.4 78.0 - 100.0 fl   MCHC 33.3 30.0 - 36.0 g/dL   RDW 13.8 11.5 - 15.5 %  Comprehensive metabolic panel  Result Value Ref Range   Sodium 135 135 - 145 mEq/L   Potassium 4.8 3.5 - 5.1 mEq/L   Chloride 97 96 - 112 mEq/L   CO2 30 19 - 32 mEq/L   Glucose, Bld 361 (H) 70 - 99 mg/dL   BUN 12 6 - 23 mg/dL   Creatinine, Ser 0.90 0.40 - 1.20 mg/dL   Total Bilirubin 0.8 0.2 - 1.2 mg/dL   Alkaline Phosphatase 112 39 - 117 U/L   AST 28 0 - 37 U/L   ALT 41 (H) 0 - 35 U/L   Total Protein 7.6 6.0 - 8.3 g/dL   Albumin 4.6 3.5 - 5.2 g/dL   GFR 73.76 >60.00 mL/min   Calcium 10.1 8.4 - 10.5 mg/dL  Hemoglobin A1c  Result Value Ref Range   Hgb A1c MFr Bld 9.6 (H) 4.6 - 6.5 %  TSH  Result Value Ref Range   TSH 1.35 0.35 - 5.50 uIU/mL  POCT Glucose (CBG)  Result Value Ref Range  POC Glucose 361 (A) 70 - 99 mg/dl

## 2021-09-27 ENCOUNTER — Encounter: Payer: Self-pay | Admitting: Family

## 2021-09-27 ENCOUNTER — Telehealth: Payer: Self-pay | Admitting: Family

## 2021-09-27 ENCOUNTER — Encounter: Payer: Self-pay | Admitting: Family Medicine

## 2021-09-27 ENCOUNTER — Other Ambulatory Visit: Payer: Self-pay

## 2021-09-27 DIAGNOSIS — E1165 Type 2 diabetes mellitus with hyperglycemia: Secondary | ICD-10-CM

## 2021-09-27 LAB — COMPREHENSIVE METABOLIC PANEL
ALT: 41 U/L — ABNORMAL HIGH (ref 0–35)
AST: 28 U/L (ref 0–37)
Albumin: 4.6 g/dL (ref 3.5–5.2)
Alkaline Phosphatase: 112 U/L (ref 39–117)
BUN: 12 mg/dL (ref 6–23)
CO2: 30 mEq/L (ref 19–32)
Calcium: 10.1 mg/dL (ref 8.4–10.5)
Chloride: 97 mEq/L (ref 96–112)
Creatinine, Ser: 0.9 mg/dL (ref 0.40–1.20)
GFR: 73.76 mL/min (ref >60.00)
Glucose, Bld: 361 mg/dL — ABNORMAL HIGH (ref 70–99)
Potassium: 4.8 mEq/L (ref 3.5–5.1)
Sodium: 135 mEq/L (ref 135–145)
Total Bilirubin: 0.8 mg/dL (ref 0.2–1.2)
Total Protein: 7.6 g/dL (ref 6.0–8.3)

## 2021-09-27 LAB — CBC
HCT: 43 % (ref 36.0–46.0)
Hemoglobin: 14.3 g/dL (ref 12.0–15.0)
MCHC: 33.3 g/dL (ref 30.0–36.0)
MCV: 83.4 fl (ref 78.0–100.0)
Platelets: 220 10*3/uL (ref 150.0–400.0)
RBC: 5.15 Mil/uL — ABNORMAL HIGH (ref 3.87–5.11)
RDW: 13.8 % (ref 11.5–15.5)
WBC: 8.2 10*3/uL (ref 4.0–10.5)

## 2021-09-27 LAB — TSH: TSH: 1.35 u[IU]/mL (ref 0.35–5.50)

## 2021-09-27 LAB — HEMOGLOBIN A1C: Hgb A1c MFr Bld: 9.6 % — ABNORMAL HIGH (ref 4.6–6.5)

## 2021-09-27 MED ORDER — GLUCOSE BLOOD VI STRP: ORAL_STRIP | 12 refills | Status: AC

## 2021-09-27 MED ORDER — BLOOD GLUCOSE METER KIT: PACK | 0 refills | Status: AC

## 2021-09-27 MED ORDER — ONETOUCH ULTRASOFT LANCETS MISC: 12 refills | Status: AC

## 2021-09-27 NOTE — Telephone Encounter (Signed)
Rx was sent 09-26-21, pharmacy is requesting specific instructions and name of glucose supplies. Rx information changed to onetouch meter, lancets and strips.  Submitted to pharmacy today

## 2021-09-27 NOTE — Telephone Encounter (Signed)
Patient states [facility] said they never received the blood glucose meter kit and supplies prescription and she has not been able to check her sugar. Please advise.  ° ° °[id number] - [place], [place] - [address]  °[address], [place] [address]  °Phone:  [phone]  Fax:  [phone]  °

## 2021-09-28 ENCOUNTER — Encounter: Payer: Self-pay | Admitting: Internal Medicine

## 2021-09-28 ENCOUNTER — Ambulatory Visit: Payer: BC Managed Care – PPO | Admitting: Internal Medicine

## 2021-09-28 ENCOUNTER — Other Ambulatory Visit: Payer: Self-pay

## 2021-09-28 VITALS — BP 142/80 | HR 81 | Ht 65.0 in | Wt 210.0 lb

## 2021-09-28 DIAGNOSIS — E1165 Type 2 diabetes mellitus with hyperglycemia: Secondary | ICD-10-CM

## 2021-09-28 DIAGNOSIS — E042 Nontoxic multinodular goiter: Secondary | ICD-10-CM

## 2021-09-28 MED ORDER — NOVOLOG FLEXPEN 100 UNIT/ML ~~LOC~~ SOPN: PEN_INJECTOR | SUBCUTANEOUS | 11 refills | Status: DC

## 2021-09-28 MED ORDER — DEXCOM G6 TRANSMITTER MISC: 1.0000 | 3 refills | Status: DC

## 2021-09-28 MED ORDER — METFORMIN HCL ER 500 MG PO TB24: 500.0000 mg | ORAL_TABLET | Freq: Every day | ORAL | 3 refills | Status: DC

## 2021-09-28 MED ORDER — DEXCOM G6 SENSOR MISC: 1.0000 | 3 refills | Status: DC

## 2021-09-28 NOTE — Progress Notes (Addendum)
Name: Michele [name]  MRN/ DOB: [id number], 01-31-1971    Age/ Sex: 51 y.o., female    PCP: Debbrah Alar, NP   Reason for Endocrinology Evaluation: MNG     Date of Initial Endocrinology Evaluation: 11/23/2020    HPI: [name] is a 51 y.o. female with a past medical history of MNG, HTN and T2DM . The patient presented for initial endocrinology clinic visit on 11/23/2020 for consultative assistance with her [place]   She has been diagnosed with MNG in 2012.  She is S/P benign FNA in 2012 of the right thyroid nodule ( 1.9 cm) and left thyroid nodule (1.5 cm)    Pt was lost to follow up    No  biotin intake  No FH of thyroid disease      SUBJECTIVE:    Today (09/28/21): Michele [name] is here for a follow up on MNG management.     She was evaluated in 08/2021 with HTN, palpitations and chest pain . She contacted her PCP a few days ago with symptomatic hyperglycemia and was started on Metformin and , she was provided with novolog per correction scale today but did not    She is accompanied by her son today   She has been checking her glucose 3-4 times a day, eats 3 meals a day   She has noted nausea and diarrhea last night as well as dizziness . 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 Posey Pronto at [facility]- S/p FNA March/april of two dominant nodules- benign.  Following for annual thyroid US with Dr. Posey Pronto.     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 Posey Pronto- endo   DILATION AND CURETTAGE OF UTERUS  05/21/2011   Procedure: DILATATION AND CURETTAGE (D&C);  Surgeon: Elveria Royals;  Location: [facility];  Service: Gynecology;  Laterality: N/A;  dilitation and currettage/endometrial currettings   TUBAL LIGATION  1997   TUBOPLASTY / TUBOTUBAL ANASTOMOSIS  2005    Social History:  reports that she has never smoked. She has never used smokeless tobacco. She reports current alcohol use. She reports that she does not use drugs. Family History: family history includes Arthritis in her maternal grandmother; Asthma in her son; Cancer in her father; Cancer (age of onset: 56) in her mother; Heart attack in her maternal grandmother; Hypertension in her maternal grandmother, maternal uncle, and mother; Seizures in her brother; Stroke in her brother and maternal grandfather.   HOME MEDICATIONS: Allergies as of 09/28/2021       Reactions   Amlodipine Shortness Of Breath, Anxiety, Palpitations, Other (See Comments)   Bee Venom Anaphylaxis   Bupropion Shortness Of  Breath, Anxiety, Palpitations, Other (See Comments)   Clonidine Derivatives    dizziness   Hydralazine    dizziness   Imitrex [sumatriptan] Nausea Only   Oxycodone Nausea Only        Medication List        Accurate as of September 28, 2021 11:25 AM. If you have any questions, ask your nurse or doctor.          ALPRAZolam 1 MG tablet Commonly known as: XANAX Take 1 mg by mouth as needed. For anxiety   Basaglar Tempo Pen 100 UNIT/ML Sopn Generic drug: Insulin Glargine w/ Trans Port Inject 10 Units into the skin daily.   blood glucose meter kit and supplies Dispense One touch meter Use up to 2 times daily as directed. (FOR ICD-10 E10.9, E11.9).Dispense based on patient and insurance preference. Use up to 2 times daily   (FOR ICD-10 E10.9, E11.9).   EpiPen 2-Pak 0.3 mg/0.3 mL Soaj injection Generic drug: EPINEPHrine daily as needed (FOR ALLERGY).   glucose blood test strip One touch Use two times a day   hydrochlorothiazide 25 MG tablet Commonly known as: HYDRODIURIL Take 1 tablet by mouth once daily   meclizine 25 MG tablet Commonly known as: ANTIVERT Take 1 tablet (25 mg total) by mouth 3 (three) times daily as needed for dizziness.   metFORMIN 500 MG tablet Commonly known as: GLUCOPHAGE Take 1 tablet (500 mg total) by mouth 2 (two) times daily with a meal.   metoprolol succinate 25 MG 24 hr tablet Commonly known as: Toprol XL Take 1 tablet (25 mg total) by mouth daily.   onetouch ultrasoft lancets Use two times a day   Pen Needles 30G X 5 MM Misc Use one daily for insulin   sertraline 100 MG tablet Commonly known as: ZOLOFT Take 100 mg by mouth daily.   valACYclovir 1000 MG tablet Commonly known as: VALTREX TAKE 2 TABLETS BY MOUTH NOW AND AGAIN IN 12 HOURS   valsartan 160 MG tablet Commonly known as: DIOVAN Take 80 mg(1/2 tablet) in the morning and 160 mg(1 tablet) at bedtime.             OBJECTIVE:  VS: BP (!) 142/80 (BP Location: Left Arm, Patient Position: Sitting, Cuff Size: Small)    Pulse 81    Ht _0  (1.651 m)    Wt 210 lb (95.3 kg)    LMP 06/11/2013    SpO2 96%    BMI 34.95 kg/m    Wt Readings from Last 3 Encounters:  09/26/21 215 lb 6.4 oz (97.7 kg)  08/15/21 219 lb (99.3 kg)  05/23/21 216 lb (98 kg)     EXAM: General: Pt appears well and is in NAD  Neck: General: Supple without adenopathy. Thyroid: Right thyroid  nodule appreciated.   Lungs: Clear with good BS bilat with no rales, rhonchi, or wheezes  Heart: Auscultation: RRR.  Abdomen: Normoactive bowel sounds, soft, nontender, without masses or organomegaly palpable  Extremities:  BL LE: No pretibial edema normal ROM and strength.  Skin: Hair: Texture and amount normal with gender appropriate  distribution Skin Inspection: No rashes Skin Palpation: Skin temperature, texture, and thickness normal to palpation  Neuro: Cranial nerves: II - XII grossly intact  Motor: Normal strength throughout DTRs: 2+ and symmetric in UE without delay in relaxation phase  Mental Status: Judgment, insight: Intact Orientation: Oriented to time, place, and person Mood and affect: No depression, anxiety, or agitation     DATA REVIEWED:  Latest Reference Range & Units 09/26/21 15:40  Sodium 135 - 145 mEq/L 135  Potassium 3.5 - 5.1 mEq/L 4.8  Chloride 96 - 112 mEq/L 97  CO2 19 - 32 mEq/L 30  Glucose 70 - 99 mg/dL 361 (H)  BUN 6 - 23 mg/dL 12  Creatinine 0.40 - 1.20 mg/dL 0.90  Calcium 8.4 - 10.5 mg/dL 10.1  Alkaline Phosphatase 39 - 117 U/L 112  Albumin 3.5 - 5.2 g/dL 4.6  AST 0 - 37 U/L 28  ALT 0 - 35 U/L 41 (H)  Total Protein 6.0 - 8.3 g/dL 7.6  Total Bilirubin 0.2 - 1.2 mg/dL 0.8  GFR >60.00 mL/min 73.76       Latest Reference Range & Units 09/26/21 15:40  TSH 0.35 - 5.50 uIU/mL 1.35       FNA right thyroid nodule 11/30/2010  Benign    FNA left thyroid nodule 11/30/2019 Non diagnostic    FNA of left thyroid nodule 12/22/2010 Benign    Thyroid ultrasound 01/13/2020  There are 2 adjacent punctate (subcentimeter) hypoattenuating nodules within the superior pole of the right lobe of the thyroid which are unchanged in hind site compared to the 2017 examination and do not meet imaging criteria to recommend percutaneous sampling or continued dedicated follow-up.   _________________________________________________________   Nodule # 2:   Prior biopsy: No   Location: Right; Mid   Maximum size: 1.2 cm; Other 2 dimensions: 1.0 x 0.7 cm, previously, 0.9 x 0.9 x 0.6 cm (when compared to the 05/2016 examination, not seen on the 10/2010 exam)   Composition: solid/almost completely solid (2)   Echogenicity: hypoechoic (2)   Shape: not taller-than-wide (0)   Margins:  smooth (0)   Echogenic foci: none (0)   ACR TI-RADS total points: 4.   ACR TI-RADS risk category:  TR4 (4-6 points).   Significant change in size (>/= 20% in two dimensions and minimal increase of 2 mm): Yes   Change in features: No   Change in ACR TI-RADS risk category: No   ACR TI-RADS recommendations:   *Given size (>/= 1 - 1.4 cm) and appearance, a follow-up ultrasound in 1 year should be considered based on TI-RADS criteria.   _________________________________________________________   Nodule # 3:   Prior biopsy: No   Location: Right; Inferior   Maximum size: 2.5 cm; Other 2 dimensions: 2.4 x 1.2 cm, previously, 2.7 x 2.1 x 1.1 cm (when compared to the 2017 examination), previously, 1.9 x 1.6 x 0.9 cm when compared to the 20/12 examination.   Composition: solid/almost completely solid (2)   Echogenicity: hypoechoic (2)   Shape: not taller-than-wide (0)   Margins: smooth (0)   Echogenic foci: punctate echogenic foci (3)   ACR TI-RADS total points: 7.   ACR TI-RADS risk category:  TR5 (>/= 7 points).   Significant change in size (>/= 20% in two dimensions and minimal increase of 2 mm): Yes   Change in features: No   Change in ACR TI-RADS risk category: No   ACR TI-RADS recommendations:   **Given size (>/= 1.0 cm) and appearance, fine needle aspiration of this highly suspicious nodule should be considered based on TI-RADS criteria.   _________________________________________________________   Punctate (approximately 0.8 cm) hypoechoic nodule within the superior pole the left lobe of the thyroid (labeled 4) is unchanged compared to the 10/2015 examination and again does not meet criteria to recommend percutaneous sampling or continued dedicated follow-up   The approximately 1.0 cm hypoechoic nodule within the  superior/mid aspect the left lobe of the thyroid (labeled 5) is unchanged compared to the 20/12 examination, previously, 0.8 cm. Stability  for greater than 5 years is indicative of a benign etiology.   _________________________________________________________   Nodule # 6:   Prior biopsy: No   Location: Left; Inferior   Maximum size: 1.8 cm; Other 2 dimensions: 1.4 x 1.2 cm, previously, 1.7 x 1.5 x 1.0 cm, previously, 1.5 x 1.3 x 0.8 cm when compared to the 20/12 examination   Composition: solid/almost completely solid (2)   Echogenicity: hypoechoic (2)   Shape: not taller-than-wide (0)   Margins: smooth (0)   Echogenic foci: none (0)   ACR TI-RADS total points: 4.   ACR TI-RADS risk category:  TR4 (4-6 points).   Significant change in size (>/= 20% in two dimensions and minimal increase of 2 mm): Yes   Change in features: No   Change in ACR TI-RADS risk category: No   ACR TI-RADS recommendations:   **Given size (>/= 1.5 cm) and appearance, fine needle aspiration of this moderately suspicious nodule should be considered based on TI-RADS criteria.   _________________________________________________________   IMPRESSION: 1. Nodules #3 and #6 have both increased in size and again both meet imaging criteria to recommend percutaneous sampling as clinically indicated. 2. Nodule #2 meets imaging criteria to recommend a 1 year follow-up. This examination documents 4 years of relative stability. Follow-up examination in 05/2021 would ensure 5 years of stability and thus a benign etiology.      ASSESSMENT/PLAN/RECOMMENDATIONS:   MNG:   - Pt is clinically and biochemically euthyroid  -No local neck symptoms - She had an Ultrasound in 01/2020, with FNA recommendations but was lost to follow up  - Will repeat thyroid ultrasound   2. Type 2 Diabetes , Poorly controlled , A1c 9.6 %    -She had already started metformin couple days ago, she did have an episode of nausea and diarrhea last night, so I would not increase metformin but I will change her to an extended release form  -I will increase her  basal insulin as below -She will also be provided with a standing dose of prandial insulin with each meal PLUS correction scale as below -She will also be referred to our CDE -A prescription of Dexcom has been sent to her pharmacy  Medication  Continue metformin 500 mg daily Increase Basaglar to 25 units daily Start NovoLog 4 units with each meal Correction factor: NovoLog (BG -130/30)  F/U in 3 weeks   Signed electronically by: Mack Guise, MD  [facility]  [facility] [address] [place], [place] [address] Phone: [phone] FAX: [phone]   CC: Debbrah Alar, NP [address] [place] [address] Phone: [phone] Fax: [phone]   Return to Endocrinology clinic as below: Future Appointments  Date Time Provider [place]  09/28/2021  3:00 PM Zyliah Schier, Melanie Crazier, MD [facility] None  09/29/2021  8:25 AM Loel Dubonnet, NP [facility]  11/22/2021  7:30 AM Tyquon Near, Melanie Crazier, MD [facility]  02/13/2022  8:00 AM Skeet Latch, MD [facility]

## 2021-09-28 NOTE — Patient Instructions (Signed)
-   Continue Metformin 500 mg , 1 tablet daily  - Increase Basaglar to 25 units daily  - Novolog 4 units with each meal  -Novolog correctional insulin: ADD extra units on insulin to your meal-time Novolog dose if your blood sugars are higher than 160. Use the scale below to help guide you before meals and bedtime if needed   Blood sugar before meal Number of units to inject  Less than 160 0 unit  161 -  190 1 units  191 -  220 2 units  221 -  250 3 units  251 -  280 4 units  281 -  310 5 units  311 -  340 6 units  341 -  370 7 units  371 -  400 8 units  401 - 430 9 units  431 - 460 10 units  461 - 490 11 units    HOW TO TREAT LOW BLOOD SUGARS (Blood sugar LESS THAN 70 MG/DL) Please follow the RULE OF 15 for the treatment of hypoglycemia treatment (when your (blood sugars are less than 70 mg/dL)   STEP 1: Take 15 grams of carbohydrates when your blood sugar is low, which includes:  3-4 GLUCOSE TABS  OR 3-4 OZ OF JUICE OR REGULAR SODA OR ONE TUBE OF GLUCOSE GEL    STEP 2: RECHECK blood sugar in 15 MINUTES STEP 3: If your blood sugar is still low at the 15 minute recheck --> then, go back to STEP 1 and treat AGAIN with another 15 grams of carbohydrates.

## 2021-09-28 NOTE — Addendum Note (Signed)
Addended by: Debbrah Alar on: 09/28/2021 01:02 PM   Modules accepted: Orders

## 2021-09-28 NOTE — Telephone Encounter (Signed)
Spoke with patient. She states that she is very thirsty and has an appointment set up this afternoon with Endocrinology. I was unaware of this appointment when I sent the novolog. Will defer to endocrinologist if she wants to proceed with sliding scale coverage. Also advised pt that she should have low threshold for going to the ED if she feels she is unable to keep up with her hydration needs. Pt verbalizes understanding.

## 2021-09-29 ENCOUNTER — Ambulatory Visit (HOSPITAL_BASED_OUTPATIENT_CLINIC_OR_DEPARTMENT_OTHER): Payer: BC Managed Care – PPO | Admitting: Family

## 2021-09-29 ENCOUNTER — Telehealth: Payer: Self-pay | Admitting: Internal Medicine

## 2021-09-29 MED ORDER — NOVOLOG FLEXPEN 100 UNIT/ML ~~LOC~~ SOPN: PEN_INJECTOR | SUBCUTANEOUS | 11 refills | Status: DC

## 2021-09-29 NOTE — Telephone Encounter (Signed)
[place] advised of maximus daily dose of 45 units

## 2021-09-29 NOTE — Telephone Encounter (Signed)
[facility] called stating they need a new prescription sent to them with the maximum daily dose of insulin. Pharmacist states she understands it's a sliding scale but still needs the maximum daily dose. Please advice.

## 2021-09-29 NOTE — Telephone Encounter (Signed)
Max 45 units/day. Please notify [facility].  Tks

## 2021-09-29 NOTE — Progress Notes (Deleted)
Office Visit    Patient Name: Michele [name] Date of Encounter: 09/29/2021  PCP:  Debbrah Alar, NP   [place]  Cardiologist:  Skeet Latch, MD  Advanced Practice Provider:  No care team member to display Electrophysiologist:  None      Chief Complaint    Michele [name] is a 51 y.o. 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 Posey Pronto at [facility]- S/p FNA March/april of two dominant nodules- benign.  Following for annual thyroid US with Dr. Posey Pronto.     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 Posey Pronto- endo   DILATION AND CURETTAGE OF UTERUS  05/21/2011   Procedure: DILATATION AND CURETTAGE (D&C);  Surgeon: Elveria Royals;  Location: [facility];  Service: Gynecology;  Laterality: N/A;  dilitation and currettage/endometrial currettings   TUBAL LIGATION  1997   TUBOPLASTY / TUBOTUBAL ANASTOMOSIS  2005    Allergies  Allergies  Allergen Reactions   Amlodipine Shortness Of Breath, Anxiety, Palpitations and Other (See Comments)   Bee Venom Anaphylaxis   Bupropion Shortness Of Breath, Anxiety, Palpitations and Other (See Comments)   Clonidine Derivatives     dizziness   Hydralazine     dizziness   Imitrex [Sumatriptan] Nausea Only   Oxycodone Nausea Only    History of Present Illness    Michele [name] is a 51 y.o. female with a  hx of hypertension, palpitations, chronic chest pain, OSA last seen 08/15/21 by Dr. Oval Linsey.  Prior cardiac cath 2013 with normal coronaries. Seen by Dr. Burt Knack 2014 for palpitations with echo 08/2013 with LVEF 50-55%, mild mitral regurgitation. She was offered trial of Metoprolol but did not utilize. She was seen 11/2019 by Dr. Oval Linsey initially for evaluation of chest pain associated with heart racing and pounding. Previous sleep study showed mild sleep apnea but did not require CPAP. She wore a monitor 12/2019 which showed no significant arrhythmias however was only able to wear her monitor for 3 hours as it made her anxious. Coronary CTA could not be completed due to elevated heart rates despite metoprolol, ivabradine, Xanax. Nuclear stress test 02/2020 showed LVEF 66%, no ischemia. BP poorly controlled and Losartan transitioned to Valsartan. Metoprolol started due to tachycardia.   She saw PCP noting falls and underwent MRI concerning for small vessel disease vs demyelinating disease and referred to neurology.   She was last seen 08/15/21. She noted right shoulder pain as well as palpitations, dizziness, shortness of breath. Her BP was labile and noting high as well as low readings. Given hypotension she had been told to discontinue Metoprolol and reduce Valsartan. She had been recommended for CPAP but was not using due to feeling she could not breath while wearing. Her  Valsartan 160mg  QHS was continued, HCTZ was started as well as Metoprolol Succinate 25mg QD.  ***  EKGs/Labs/Other Studies Reviewed:   The following studies were reviewed today: Myocardial Stress Test 02/12/20 The left ventricular ejection fraction is hyperdynamic (>65%). Nuclear stress EF: 66%. Blood pressure demonstrated a hypertensive response to exercise. There was no ST segment deviation noted during stress. The study is normal. This is a low risk study. Normal stress nuclear study with no ischemia or infarction.  Gated  ejection fraction 66% with normal wall motion.   Echo 01/21/20  1. Left ventricular ejection fraction, by estimation, is 60 to 65%. The  left ventricle has normal function. The left ventricle has no regional  wall motion abnormalities. There is mild left ventricular hypertrophy.  Left ventricular diastolic parameters  are consistent with Grade I diastolic dysfunction (impaired relaxation).   2. Right ventricular systolic function is hyperdynamic. The right  ventricular size is normal. Tricuspid regurgitation signal is inadequate  for assessing PA pressure.   3. The mitral valve is grossly normal. Trivial mitral valve  regurgitation.   4. The aortic valve is tricuspid. Aortic valve regurgitation is not  visualized.   5. The inferior vena cava is normal in size with greater than 50%  respiratory variability, suggesting right atrial pressure of 3 mmHg.      7 Day Event Monitor 12/2019: Quality: Fair.  Baseline artifact. Predominant rhythm: sinus tachycardia Average heart rate: 103 bpm Max heart rate: 172 bpm Min heart rate: 76 bpm   No arrhythmias   Echo 08/2013: Study Conclusions  - Left ventricle: The cavity size was normal. Wall thickness    was normal. Systolic function was normal. The estimated    ejection fraction was in the range of 50% to 55%. Wall    motion was normal; there were no regional wall motion    abnormalities. Left ventricular diastolic function    parameters were normal.  - Mitral valve: Mild regurgitation.  - Atrial septum: No defect or patent foramen ovale was    identified.    Renal artery Doppler 12/25/11: normal renal arteries    EKG:  EKG is ordered today.  The ekg ordered today demonstrates ***  Recent Labs: 09/26/2021: ALT 41; BUN 12; Creatinine, Ser 0.90; Hemoglobin 14.3; Platelets 220.0; Potassium 4.8; Sodium 135; TSH 1.35  Recent Lipid Panel    Component Value Date/Time   CHOL 201 (H) 09/21/2020 1541   TRIG 113.0 09/21/2020 1541   HDL  39.30 09/21/2020 1541   CHOLHDL 5 09/21/2020 1541   VLDL 22.6 09/21/2020 1541   LDLCALC 139 (H) 09/21/2020 1541    Home Medications   No outpatient medications have been marked as taking for the 09/29/21 encounter (Appointment) with Loel Dubonnet, NP.     Review of Systems      All other systems reviewed and are otherwise negative except as noted above.  Physical Exam    VS:  LMP 06/11/2013  , BMI There is no height or weight on file to calculate BMI.  Wt Readings from Last 3 Encounters:  09/28/21 210 lb (95.3 kg)  09/26/21 215 lb 6.4 oz (97.7 kg)  08/15/21 219 lb (99.3 kg)     GEN: Well nourished, well developed, in no acute distress. HEENT: normal. Neck: Supple, no JVD, carotid bruits, or masses. Cardiac: ***RRR, no murmurs, rubs, or gallops. No clubbing, cyanosis, edema.  ***Radials/PT 2+ and equal bilaterally.  Respiratory:  ***Respirations regular and unlabored, clear to auscultation bilaterally. GI: Soft,  nontender, nondistended. MS: No deformity or atrophy. Skin: Warm and dry, no rash. Neuro:  Strength and sensation are intact. Psych: Normal affect.  Assessment & Plan    HTN -   Palpitations -   Atypical chest pain - Negative myoview 2021. ***  Disposition: Follow up {follow up:15908} with Skeet Latch, MD or APP.  Signed, Loel Dubonnet, NP 09/29/2021, 7:45 AM [place]

## 2021-09-29 NOTE — Telephone Encounter (Signed)
insulin aspart (NOVOLOG FLEXPEN) 100 UNIT/ML FlexPen  Patient called stating pharmacy need complete dosage on the above medication. The maximum dose per day is needed.

## 2021-09-30 ENCOUNTER — Other Ambulatory Visit: Payer: Self-pay | Admitting: Family Medicine

## 2021-09-30 ENCOUNTER — Other Ambulatory Visit (HOSPITAL_BASED_OUTPATIENT_CLINIC_OR_DEPARTMENT_OTHER): Payer: Self-pay

## 2021-09-30 ENCOUNTER — Encounter: Payer: Self-pay | Admitting: Internal Medicine

## 2021-09-30 ENCOUNTER — Other Ambulatory Visit: Payer: Self-pay | Admitting: Internal Medicine

## 2021-09-30 DIAGNOSIS — E1165 Type 2 diabetes mellitus with hyperglycemia: Secondary | ICD-10-CM

## 2021-09-30 MED ORDER — BASAGLAR KWIKPEN 100 UNIT/ML ~~LOC~~ SOPN: 25.0000 [IU] | PEN_INJECTOR | Freq: Every day | SUBCUTANEOUS | 6 refills | Status: DC

## 2021-10-03 ENCOUNTER — Telehealth: Payer: Self-pay | Admitting: Pharmacy Technician

## 2021-10-03 ENCOUNTER — Encounter: Payer: Self-pay | Admitting: Hematology & Oncology

## 2021-10-03 ENCOUNTER — Other Ambulatory Visit (HOSPITAL_COMMUNITY): Payer: Self-pay

## 2021-10-03 NOTE — Telephone Encounter (Signed)
Patient Advocate Encounter   Received notification from CoverMyMeds that prior authorization for Insulin Aspart flexpen (Generic Novolog) is required by his/her insurance [employer].  Per test claim, pt had brand name filled 09/29/21. Refill payable on or after 10/22/21. No PA needed.

## 2021-10-04 ENCOUNTER — Other Ambulatory Visit (HOSPITAL_COMMUNITY): Payer: Self-pay

## 2021-10-04 ENCOUNTER — Encounter: Payer: Self-pay | Admitting: Hematology & Oncology

## 2021-10-04 ENCOUNTER — Telehealth: Payer: Self-pay

## 2021-10-04 MED ORDER — INSULIN GLARGINE-YFGN 100 UNIT/ML ~~LOC~~ SOPN: 25.0000 [IU] | PEN_INJECTOR | Freq: Every day | SUBCUTANEOUS | 3 refills | Status: DC

## 2021-10-04 NOTE — Addendum Note (Signed)
Addended by: Dorita Sciara on: 10/04/2021 03:10 PM   Modules accepted: Orders

## 2021-10-04 NOTE — Telephone Encounter (Signed)
Basaglar needs a PA

## 2021-10-06 ENCOUNTER — Other Ambulatory Visit (HOSPITAL_COMMUNITY): Payer: Self-pay

## 2021-10-06 ENCOUNTER — Telehealth: Payer: Self-pay

## 2021-10-06 NOTE — Telephone Encounter (Signed)
Patient Advocate Encounter   Received notification from patient calls that prior authorization for Michele [name] is required by his/her insurance [facility]   PA submitted on 10/06/21  Key#: [id number]  Status is pending    [facility] will continue to follow:  Patient Advocate Fax: [phone]

## 2021-10-07 NOTE — Telephone Encounter (Signed)
Patient picked up sample of Basaglar

## 2021-10-10 ENCOUNTER — Other Ambulatory Visit (HOSPITAL_COMMUNITY): Payer: Self-pay

## 2021-10-10 ENCOUNTER — Other Ambulatory Visit: Payer: BC Managed Care – PPO

## 2021-10-10 NOTE — Telephone Encounter (Signed)
Patient Advocate Encounter  Received notification from [facility] that the request for prior authorization for Basaglar has been denied due to alternatives not being tried.    Specialty Pharmacy Patient Advocate Fax:  [phone]

## 2021-10-11 ENCOUNTER — Other Ambulatory Visit (HOSPITAL_COMMUNITY): Payer: Self-pay

## 2021-10-11 ENCOUNTER — Other Ambulatory Visit: Payer: Self-pay | Admitting: Family

## 2021-10-12 ENCOUNTER — Other Ambulatory Visit: Payer: Self-pay

## 2021-10-12 MED ORDER — DEXCOM G6 SENSOR MISC: 3 refills | Status: DC

## 2021-10-13 ENCOUNTER — Other Ambulatory Visit: Payer: Self-pay | Admitting: Internal Medicine

## 2021-10-19 ENCOUNTER — Ambulatory Visit: Payer: BC Managed Care – PPO | Admitting: Internal Medicine

## 2021-10-19 ENCOUNTER — Other Ambulatory Visit: Payer: Self-pay

## 2021-10-19 ENCOUNTER — Encounter: Payer: Self-pay | Admitting: Internal Medicine

## 2021-10-19 VITALS — BP 122/80 | HR 92 | Ht 65.0 in | Wt 211.0 lb

## 2021-10-19 DIAGNOSIS — E042 Nontoxic multinodular goiter: Secondary | ICD-10-CM

## 2021-10-19 DIAGNOSIS — E785 Hyperlipidemia, unspecified: Secondary | ICD-10-CM | POA: Diagnosis not present

## 2021-10-19 DIAGNOSIS — E1165 Type 2 diabetes mellitus with hyperglycemia: Secondary | ICD-10-CM

## 2021-10-19 LAB — LIPID PANEL
Cholesterol: 188 mg/dL (ref 0–200)
HDL: 35.6 mg/dL — ABNORMAL LOW (ref >39.00)
LDL Cholesterol: 121 mg/dL — ABNORMAL HIGH (ref 0–99)
NonHDL: 152.26
Total CHOL/HDL Ratio: 5
Triglycerides: 158 mg/dL — ABNORMAL HIGH (ref 0.0–149.0)
VLDL: 31.6 mg/dL (ref 0.0–40.0)

## 2021-10-19 LAB — BASIC METABOLIC PANEL
BUN: 22 mg/dL (ref 6–23)
CO2: 30 mEq/L (ref 19–32)
Calcium: 10 mg/dL (ref 8.4–10.5)
Chloride: 101 mEq/L (ref 96–112)
Creatinine, Ser: 0.94 mg/dL (ref 0.40–1.20)
GFR: 69.98 mL/min (ref >60.00)
Glucose, Bld: 81 mg/dL (ref 70–99)
Potassium: 4.2 mEq/L (ref 3.5–5.1)
Sodium: 140 mEq/L (ref 135–145)

## 2021-10-19 MED ORDER — FREESTYLE LIBRE 2 SENSOR MISC: 1.0000 | 3 refills | Status: DC

## 2021-10-19 MED ORDER — OZEMPIC (0.25 OR 0.5 MG/DOSE) 2 MG/1.5ML ~~LOC~~ SOPN: 0.5000 mg | PEN_INJECTOR | SUBCUTANEOUS | 3 refills | Status: DC

## 2021-10-19 MED ORDER — INSULIN GLARGINE-YFGN 100 UNIT/ML ~~LOC~~ SOPN: 12.0000 [IU] | PEN_INJECTOR | Freq: Every day | SUBCUTANEOUS | 3 refills | Status: DC

## 2021-10-19 NOTE — Patient Instructions (Addendum)
-   Continue Metformin 500 mg , 1 tablet daily  - Increase semglee  12 units daily  - Start Ozempic 0.25 mg weekly for 6 weeks, then increase to 0.5 mg weekly   -Novolog correctional insulin: Use the scale below to help guide you before meals   Blood sugar before meal Number of units to inject  Less than 160 0 unit  161 -  190 1 units  191 -  220 2 units  221 -  250 3 units  251 -  280 4 units  281 -  310 5 units  311 -  340 6 units  341 -  370 7 units  371 -  400 8 units  401 - 430 9 units  431 - 460 10 units  461 - 490 11 units    HOW TO TREAT LOW BLOOD SUGARS (Blood sugar LESS THAN 70 MG/DL) Please follow the RULE OF 15 for the treatment of hypoglycemia treatment (when your (blood sugars are less than 70 mg/dL)   STEP 1: Take 15 grams of carbohydrates when your blood sugar is low, which includes:  3-4 GLUCOSE TABS  OR 3-4 OZ OF JUICE OR REGULAR SODA OR ONE TUBE OF GLUCOSE GEL    STEP 2: RECHECK blood sugar in 15 MINUTES STEP 3: If your blood sugar is still low at the 15 minute recheck --> then, go back to STEP 1 and treat AGAIN with another 15 grams of carbohydrates.

## 2021-10-19 NOTE — Progress Notes (Signed)
Name: Michele [name]  Age/ Sex: 51 y.o., female   MRN/ DOB: [id number], 12/01/1970     PCP: Debbrah Alar, NP   Reason for Endocrinology Evaluation: Type 2 Diabetes Mellitus/ MNG  Initial Endocrine Consultative Visit: 11/23/2020    PATIENT IDENTIFIER: Michele [name] is a 51 y.o. female with a past medical history of MNG, T2DM, and HTN. The patient has followed with Endocrinology clinic since 11/23/2020 for consultative assistance with management of her diabetes.  DIABETIC HISTORY:  Michele [name] was diagnosed with DM in 2016, she was on metformin until 09/2021 when insulin was started due to hyperglycemia. Her hemoglobin A1c has ranged from 5.6% in 09/2020, peaking at 9.6% in 09/2021.  She was started on basal/prandial insulin with an A1c 9.6% in 09/2021  THYROID HISTORY: She has been diagnosed with MNG in 2012.  She is S/P benign FNA in 2012 of the right thyroid nodule ( 1.9 cm) and left thyroid nodule (1.5 cm)    Thyroid FNA was recommended in 2021 but she was lost to follow-up until her return in 2023   No  biotin intake  No FH of thyroid disease     SUBJECTIVE:   During the last visit (09/28/2021): A1c 9.6%, started prandial insulin, continued metformin and basal insulin  Today (10/19/2021): Michele [name] is here for follow-up on diabetes management and multinodular goiter.  She checks her blood sugars 3-4 times. The patient has not had hypoglycemic episodes since the last clinic visit  Denies nausea, vomiting or diarrhea  Denies hx of pancreatitis    HOME DIABETES REGIMEN:   Metformin 500 mg daily Semglee/ Basaglar  25 units daily- takes 10 units  NovoLog 4 units with each meal Correction factor: NovoLog (BG -130/30)  Statin: no ACE-I/ARB: yes    METER DOWNLOAD SUMMARY: unable to download  90-131 MG/DL     DIABETIC COMPLICATIONS: Microvascular complications:   Denies: CKD, neuropathy, retinopathy Last Eye Exam: Completed    Macrovascular complications:  Denies: CAD, CVA, PVD   HISTORY:  Past Medical History:  Past Medical History:  Diagnosis Date   Anemia    iron deficient, microcytic, hypochromic   Anxiety    Chest pain    atypical   Depression    Diabetes mellitus without complication (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 Posey Pronto at [facility]- S/p FNA March/april of two dominant nodules- benign.  Following for annual thyroid US with Dr. Posey Pronto.     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 Posey Pronto- endo   DILATION AND CURETTAGE OF UTERUS  05/21/2011   Procedure: DILATATION AND CURETTAGE (D&C);  Surgeon: Elveria Royals;  Location: [facility];  Service: Gynecology;  Laterality: N/A;  dilitation and currettage/endometrial currettings   TUBAL LIGATION  1997   TUBOPLASTY / TUBOTUBAL ANASTOMOSIS  2005   Social History:  reports that she has never smoked. She has never used smokeless tobacco. She reports current alcohol use. She reports that she does not use drugs. Family History:  Family History  Problem Relation Age of Onset  Hypertension Mother    Cancer Mother 1       breast   Cancer Father        colon   Stroke Brother        handicapped due to complications from spinal meningitis   Seizures Brother    Asthma Son    Arthritis Maternal Grandmother    Hypertension Maternal Grandmother    Heart attack Maternal Grandmother    Stroke Maternal Grandfather    Hypertension Maternal Uncle      HOME MEDICATIONS: Allergies as of 10/19/2021       Reactions   Amlodipine Shortness Of Breath, Anxiety, Palpitations, Other (See Comments)   Bee Venom Anaphylaxis   Bupropion Shortness Of Breath,  Anxiety, Palpitations, Other (See Comments)   Clonidine Derivatives    dizziness   Hydralazine    dizziness   Imitrex [sumatriptan] Nausea Only   Oxycodone Nausea Only        Medication List        Accurate as of October 19, 2021  3:19 PM. If you have any questions, ask your nurse or doctor.          ALPRAZolam 1 MG tablet Commonly known as: XANAX Take 1 mg by mouth as needed. For anxiety   blood glucose meter kit and supplies Dispense One touch meter Use up to 2 times daily as directed. (FOR ICD-10 E10.9, E11.9).Dispense based on patient and insurance preference. Use up to 2 times daily  (FOR ICD-10 E10.9, E11.9).   Dexcom G6 Transmitter Misc 1 Device by Does not apply route as directed.   EpiPen 2-Pak 0.3 mg/0.3 mL Soaj injection Generic drug: EPINEPHrine daily as needed (FOR ALLERGY).   FreeStyle Libre 2 Sensor Misc 1 Device by Does not apply route every 14 (fourteen) days. What changed:  how much to take how to take this when to take this additional instructions Changed by: Dorita Sciara, MD   glucose blood test strip One touch Use two times a day   hydrochlorothiazide 25 MG tablet Commonly known as: HYDRODIURIL Take 1 tablet by mouth once daily   Insulin Aspart FlexPen 100 UNIT/ML Commonly known as: NOVOLOG 4 units with each meal, max daily dose 30 units   insulin glargine-yfgn 100 UNIT/ML Pen Commonly known as: SEMGLEE Inject 12 Units into the skin daily. What changed: how much to take Changed by: Dorita Sciara, MD   meclizine 25 MG tablet Commonly known as: ANTIVERT Take 1 tablet (25 mg total) by mouth 3 (three) times daily as needed for dizziness.   metFORMIN 500 MG 24 hr tablet Commonly known as: GLUCOPHAGE-XR Take 1 tablet (500 mg total) by mouth daily with breakfast.   metoprolol succinate 25 MG 24 hr tablet Commonly known as: Toprol XL Take 1 tablet (25 mg total) by mouth daily.   onetouch ultrasoft lancets Use two  times a day   Ozempic (0.25 or 0.5 MG/DOSE) 2 MG/1.5ML Sopn Generic drug: Semaglutide(0.25 or 0.5MG/DOS) Inject 0.5 mg into the skin once a week. Started by: Dorita Sciara, MD   Pen Needles 30G X 5 MM Misc Use one daily for insulin   sertraline 100 MG tablet Commonly known as: ZOLOFT Take 100 mg by mouth daily.   valACYclovir 1000 MG tablet Commonly known as: VALTREX TAKE 2 TABLETS BY MOUTH NOW AND AGAIN IN 12 HOURS   valsartan 160 MG tablet Commonly known as: DIOVAN Take 80 mg(1/2 tablet) in the morning and 160 mg(1 tablet) at bedtime.  OBJECTIVE:   Vital Signs: BP 122/80 (BP Location: Left Arm, Patient Position: Sitting, Cuff Size: Small)    Pulse 92    Ht 5' 5" (1.651 m)    Wt 211 lb (95.7 kg)    LMP 06/11/2013    SpO2 94%    BMI 35.11 kg/m   Wt Readings from Last 3 Encounters:  10/19/21 211 lb (95.7 kg)  09/28/21 210 lb (95.3 kg)  09/26/21 215 lb 6.4 oz (97.7 kg)     Exam: General: Pt appears well and is in NAD  Lungs: Clear with good BS bilat with no rales, rhonchi, or wheezes  Heart: RRR with normal S1 and S2 and no gallops; no murmurs; no rub  Abdomen: Normoactive bowel sounds, soft, nontender, without masses or organomegaly palpable  Extremities: No pretibial edema.  Neuro: MS is good with appropriate affect, pt is alert and Ox3    DM foot exam:10/19/2021  The skin of the feet is intact without sores or ulcerations. The pedal pulses are 2+ on right and 2+ on left. The sensation is intact to a screening 5.07, 10 gram monofilament bilaterally          DATA REVIEWED:  Lab Results  Component Value Date   HGBA1C 9.6 (H) 09/26/2021   HGBA1C 5.6 09/21/2020   HGBA1C 6.0 03/10/2020    Latest Reference Range & Units 10/19/21 15:19  Sodium 135 - 145 mEq/L 140  Potassium 3.5 - 5.1 mEq/L 4.2  Chloride 96 - 112 mEq/L 101  CO2 19 - 32 mEq/L 30  Glucose 70 - 99 mg/dL 81  BUN 6 - 23 mg/dL 22  Creatinine 0.40 - 1.20 mg/dL 0.94  Calcium 8.4 -  10.5 mg/dL 10.0  GFR >60.00 mL/min 69.98    Latest Reference Range & Units 10/19/21 15:19  Total CHOL/HDL Ratio  5  Cholesterol 0 - 200 mg/dL 188  HDL Cholesterol >39.00 mg/dL 35.60 (L)  LDL (calc) 0 - 99 mg/dL 121 (H)  MICROALB/CREAT RATIO 0.0 - 30.0 mg/g 0.6  NonHDL  152.26  Triglycerides 0.0 - 149.0 mg/dL 158.0 (H)  VLDL 0.0 - 40.0 mg/dL 31.6    Latest Reference Range & Units 10/19/21 15:19  Creatinine,U mg/dL 134.0  Microalb, Ur 0.0 - 1.9 mg/dL 0.8  MICROALB/CREAT RATIO 0.0 - 30.0 mg/g 0.6       ASSESSMENT / PLAN / RECOMMENDATIONS:   1) Type 2 Diabetes Mellitus, Poorly controlled, Without  complications - Most recent A1c of 9.3 %. Goal A1c < 7.0 %.    - I have praised the pt on improved glycemic control  - Her BG's have been < 140 mg/dL  - We discussed GLP-1 agonist , cautioned against GI side effects  - Will stop standing prandial dose of novolog but may continue to use correction scale  - She has been using sub-optimal dose of basal insulin  - Dexcom cost $250 . Will try freestyle libre   MEDICATIONS: Continue Metformin 500 mg XR daily  Increase Semglee 12 units daily  Start Ozempic 0.5 mg weekly  Correction scale: NOvolog (BG -130/30)   EDUCATION / INSTRUCTIONS: BG monitoring instructions: Patient is instructed to check her blood sugars 3 times a day, before meals . Call [facility] if: BG persistently < 70 I reviewed the Rule of 15 for the treatment of hypoglycemia in detail with the patient. Literature supplied.   2) Diabetic complications:  Eye: Does not have known diabetic retinopathy.  Neuro/ Feet: Does not have known diabetic  peripheral neuropathy .  Renal: Patient does not have known baseline CKD. She   is  on an ACEI/ARB at present.   3) Dyslipidemia :  -LDL and TG above goal - Discussed cardiovascular benefits of statins  -We will start statin therapy as below   Medication Start rosuvastatin 5 mg daily   4) MNG:  -  Scheduled for thyroid ultrasound next week    F/U in 3 months     Signed electronically by: Mack Guise, MD  [facility]  [facility] [address] [place], [place] [address] Phone: [phone] FAX: [phone]   CC: Debbrah Alar, [address] [place] [address] Phone: [phone]  Fax: [phone]  Return to Endocrinology clinic as below: Future Appointments  Date Time Provider [place]  10/24/2021  4:00 PM [facility] [name] [facility]  11/22/2021  7:30 AM Shamleffer, Melanie Crazier, MD [facility] None  12/01/2021 10:30 AM Clydell Hakim, RD [place] [facility]  02/13/2022  8:00 AM Skeet Latch, MD [facility]

## 2021-10-20 ENCOUNTER — Other Ambulatory Visit (HOSPITAL_COMMUNITY): Payer: Self-pay

## 2021-10-20 ENCOUNTER — Encounter: Payer: Self-pay | Admitting: Internal Medicine

## 2021-10-20 DIAGNOSIS — E1165 Type 2 diabetes mellitus with hyperglycemia: Secondary | ICD-10-CM | POA: Insufficient documentation

## 2021-10-20 DIAGNOSIS — E785 Hyperlipidemia, unspecified: Secondary | ICD-10-CM | POA: Insufficient documentation

## 2021-10-20 LAB — MICROALBUMIN / CREATININE URINE RATIO
Creatinine,U: 134 mg/dL
Microalb Creat Ratio: 0.6 mg/g (ref 0.0–30.0)
Microalb, Ur: 0.8 mg/dL (ref 0.0–1.9)

## 2021-10-20 MED ORDER — ROSUVASTATIN CALCIUM 5 MG PO TABS: 5.0000 mg | ORAL_TABLET | Freq: Every day | ORAL | 3 refills | Status: DC

## 2021-10-21 MED ORDER — TRULICITY 0.75 MG/0.5ML ~~LOC~~ SOAJ: 0.7500 mg | SUBCUTANEOUS | 1 refills | Status: DC

## 2021-10-24 ENCOUNTER — Telehealth: Payer: Self-pay

## 2021-10-24 ENCOUNTER — Other Ambulatory Visit (HOSPITAL_COMMUNITY): Payer: Self-pay

## 2021-10-24 ENCOUNTER — Ambulatory Visit
Admission: RE | Admit: 2021-10-24 | Discharge: 2021-10-24 | Disposition: A | Discharge status: Home / Self Care | Payer: BC Managed Care – PPO | Source: Ambulatory Visit | Attending: Internal Medicine | Admitting: Internal Medicine

## 2021-10-24 DIAGNOSIS — E042 Nontoxic multinodular goiter: Secondary | ICD-10-CM

## 2021-10-24 NOTE — Telephone Encounter (Signed)
Ozempic 0.5 weekly

## 2021-10-25 ENCOUNTER — Encounter: Payer: Self-pay | Admitting: Hematology & Oncology

## 2021-10-25 ENCOUNTER — Other Ambulatory Visit (HOSPITAL_COMMUNITY): Payer: Self-pay

## 2021-10-25 ENCOUNTER — Telehealth: Payer: Self-pay | Admitting: Internal Medicine

## 2021-10-25 DIAGNOSIS — E042 Nontoxic multinodular goiter: Secondary | ICD-10-CM

## 2021-10-25 NOTE — Telephone Encounter (Signed)
Attempted to contact the pt on 10/25/2021 at 1515 without answer and no option to leave a voice mail to discuss FNA of the right thyroid nodules.   FNA orders will be placed and a portal message was sent to the pt    [place], MD  [facility]  [facility] [address] [place], [place] [address] Phone: [phone] FAX: [phone]

## 2021-10-25 NOTE — Telephone Encounter (Signed)
Patient Advocate Encounter  Prior Authorization for [employer] injector has been approved.    PA# N/A  Effective dates: 10/25/21 through 10/24/22  Spoke with Pharmacy to Process.  Patient Advocate Fax: [phone]

## 2021-10-25 NOTE — Telephone Encounter (Signed)
It was in the office not where patient was seen on 10/19/21. It had been changed but patient wanted to do a PA to see if she can get it covered

## 2021-10-26 ENCOUNTER — Other Ambulatory Visit: Payer: Self-pay | Admitting: Cardiovascular Disease

## 2021-11-11 ENCOUNTER — Telehealth: Payer: Self-pay | Admitting: Family

## 2021-11-11 ENCOUNTER — Other Ambulatory Visit: Payer: Self-pay

## 2021-11-11 ENCOUNTER — Ambulatory Visit: Payer: BC Managed Care – PPO | Admitting: Family

## 2021-11-11 ENCOUNTER — Ambulatory Visit (HOSPITAL_BASED_OUTPATIENT_CLINIC_OR_DEPARTMENT_OTHER)
Admission: RE | Admit: 2021-11-11 | Discharge: 2021-11-11 | Disposition: A | Discharge status: Home / Self Care | Payer: BC Managed Care – PPO | Source: Ambulatory Visit | Attending: Family | Admitting: Family

## 2021-11-11 VITALS — BP 137/85 | HR 72 | Temp 98.1°F | Resp 16 | Wt 207.0 lb

## 2021-11-11 DIAGNOSIS — E1165 Type 2 diabetes mellitus with hyperglycemia: Secondary | ICD-10-CM

## 2021-11-11 DIAGNOSIS — R1084 Generalized abdominal pain: Secondary | ICD-10-CM

## 2021-11-11 DIAGNOSIS — K625 Hemorrhage of anus and rectum: Secondary | ICD-10-CM

## 2021-11-11 DIAGNOSIS — F4321 Adjustment disorder with depressed mood: Secondary | ICD-10-CM | POA: Diagnosis not present

## 2021-11-11 DIAGNOSIS — F410 Panic disorder [episodic paroxysmal anxiety] without agoraphobia: Secondary | ICD-10-CM | POA: Diagnosis not present

## 2021-11-11 NOTE — Telephone Encounter (Signed)
Please call Dr. Lorie Apley office and request copy of last colonoscopy.   ?

## 2021-11-11 NOTE — Assessment & Plan Note (Signed)
New. Recommend CBC.  If increased frequency of BRBPR over the weekend, pt is advised to go to the ED. Otherwise will see how CT abd looks and have her follow through with planned GI visit on Tuesday.  ?

## 2021-11-11 NOTE — Assessment & Plan Note (Signed)
New.  Recommended that we obtain CT abd/pelvis tonight to further evaluate. I recommended that she hold her Sunday Ozempic dose pending GI evaluation on Tuesday as this may be a contributing factor to her GI issues.  ?

## 2021-11-11 NOTE — Progress Notes (Signed)
Subjective:   By signing my name below, I, [name], attest that this documentation has been prepared under the direction and in the presence of [name], NP 11/11/2021   Patient ID: Michele [name], female    DOB: 1971/08/03, 51 y.o.   MRN: [id number]  Chief Complaint  Patient presents with   Blood In Stools    Complains of blood in stool that started today, appointment with GI Tuesday 11-15-21    HPI Patient is in today for an office visit.   Abdominal Pain - Patient complains of abdominal pain. She noticed that symptoms arose 11/11/2021. She also has rectal bleeding. She is suggested to go to an ER if she has multiple blood in stool. She is recommended to not take Ozempic until seeing Dr. Collene Mares.   Health Maintenance Due  Topic Date Due   COVID-19 Vaccine (1) Never done   Zoster Vaccines- Shingrix (1 of 2) Never done   COLONOSCOPY (Pts 45-105yr Insurance coverage will need to be confirmed)  Never done   FOOT EXAM  10/04/2018   OPHTHALMOLOGY EXAM  06/06/2019   PAP SMEAR-Modifier  04/05/2021   MAMMOGRAM  07/13/2021    Past Medical History:  Diagnosis Date   Anemia    iron deficient, microcytic, hypochromic   Anxiety    Chest pain    atypical   Depression    Diabetes mellitus without complication ([place]    Fatigue    Fatty liver 08/15/2013   Fibroids    uterine   Hypertension    Migraines    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 March/april of two dominant nodules- benign.  Following for annual thyroid [facility]. 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 PPosey Pronto endo   DILATION AND CURETTAGE OF UTERUS  05/21/2011   Procedure: DILATATION AND CURETTAGE (D&C);  Surgeon: VElveria Royals  [facility];  Service:  Gynecology;  Laterality: N/A;  dilitation and currettage/endometrial currettings   TUBAL LIGATION  1997   TUBOPLASTY / TUBOTUBAL ANASTOMOSIS  2005    Family History  Problem Relation Age of Onset   Hypertension Mother    Cancer Mother 425      breast   Cancer Father        colon   Stroke Brother        handicapped due to complications from spinal meningitis   Seizures Brother    Asthma Son    Arthritis Maternal Grandmother    Hypertension Maternal Grandmother    Heart attack Maternal Grandmother    Stroke Maternal Grandfather    Hypertension Maternal Uncle     Social History   Socioeconomic History   Marital status: Married    Spouse name: DWarehouse manager  Number of children: 2   Years of education: Not on file   Highest education level: Not on file  Occupational History   Occupation: CLAIMS AGENT    Employer: [employer]  Tobacco Use   Smoking status: Never   Smokeless tobacco: Never   Tobacco comments:    never used tobacco  Substance and Sexual Activity   Alcohol use: Yes    Comment: Once a month  Drug use: No   Sexual activity: Yes    Birth control/protection: None, Surgical  Other Topics Concern   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 For anxiety     blood glucose meter kit and supplies Dispense One touch meter Use up to 2 times daily as directed. (FOR ICD-10 E10.9, E11.9).Dispense based on  patient and insurance preference. Use up to 2 times daily  (FOR ICD-10 E10.9, E11.9). 1 each 0   Continuous Blood Gluc Sensor (FREESTYLE LIBRE 2 SENSOR) MISC 1 Device by Does not apply route every 14 (fourteen) days. 6 each 3   Dulaglutide (TRULICITY) 8.29 FA/2.1HY SOPN Inject 0.75 mg into the skin once a week. 6 mL 1   EPIPEN 2-PAK 0.3 MG/0.3ML SOAJ injection daily as needed (FOR ALLERGY).      glucose blood test strip One touch Use two times a day 100 each 12   hydrochlorothiazide (HYDRODIURIL) 25 MG tablet Take 1 tablet by mouth once daily 30 tablet 0   Insulin Aspart FlexPen (NOVOLOG) 100 UNIT/ML 4 units with each meal, max daily dose 30 units 15 mL 11   insulin glargine-yfgn (SEMGLEE) 100 UNIT/ML Pen Inject 12 Units into the skin daily. 15 mL 3   Insulin Pen Needle (PEN NEEDLES) 30G X 5 MM MISC Use one daily for insulin 50 each 1   Lancets (ONETOUCH ULTRASOFT) lancets Use two times a day 100 each 12   metFORMIN (GLUCOPHAGE-XR) 500 MG 24 hr tablet Take 1 tablet (500 mg total) by mouth daily with breakfast. 90 tablet 3   metoprolol succinate (TOPROL XL) 25 MG 24 hr tablet Take 1 tablet (25 mg total) by mouth daily. 90 tablet 3   rosuvastatin (CRESTOR) 5 MG tablet Take 1 tablet (5 mg total) by mouth daily. 90 tablet 3   sertraline (ZOLOFT) 100 MG tablet Take 100 mg by mouth daily.     valACYclovir (VALTREX) 1000 MG tablet TAKE 2 TABLETS BY MOUTH NOW AND AGAIN IN 12 HOURS 4 tablet 0   valsartan (DIOVAN) 160 MG tablet Take 1 tablet by mouth once daily 90 tablet 1   meclizine (ANTIVERT) 25 MG tablet Take 1 tablet (25 mg total) by mouth 3 (three) times daily as needed for dizziness. 30 tablet 2   No facility-administered medications prior to visit.    Allergies  Allergen Reactions   Amlodipine Shortness Of Breath, Anxiety, Palpitations and Other (See Comments)   Bee Venom Anaphylaxis   Bupropion Shortness Of Breath, Anxiety, Palpitations and Other (See Comments)   Clonidine Derivatives      dizziness   Hydralazine     dizziness   Imitrex [Sumatriptan] Nausea Only   Oxycodone Nausea Only    Review of Systems  Gastrointestinal:  Positive for abdominal pain and blood in stool.      Objective:    Physical Exam  Constitutional:      General: She is not in acute distress.    Appearance: Normal appearance. She is not ill-appearing.  HENT:     Head: Normocephalic and atraumatic.     Right Ear: External ear normal.     Left Ear: External ear normal.  Eyes:     Extraocular Movements: Extraocular movements intact.     Pupils: Pupils are equal, round, and reactive to light.  Cardiovascular:     Rate and Rhythm: Normal rate and regular rhythm.     Heart sounds: Normal heart sounds. No murmur heard.   No gallop.  Pulmonary:     Effort: Pulmonary effort is normal. No respiratory distress.     Breath sounds: Normal breath sounds. No wheezing or rales.  Abdominal:     General: Bowel sounds are normal. There is distension (firm abdomen).     Tenderness: There is abdominal tenderness in the right lower quadrant and left lower quadrant. There is no guarding.     Hernia: No hernia is present.  Skin:    General: Skin is warm and dry.  Neurological:     Mental Status: She is alert and oriented to person, place, and time.  Psychiatric:        Judgment: Judgment normal.    BP 137/85 (BP Location: Left Arm, Patient Position: Sitting, Cuff Size: Large)    Pulse 72    Temp 98.1 F (36.7 C) (Oral)    Resp 16    Wt 207 lb (93.9 kg)    LMP 06/11/2013    SpO2 98%    BMI 34.45 kg/m  Wt Readings from Last 3 Encounters:  11/11/21 207 lb (93.9 kg)  10/19/21 211 lb (95.7 kg)  09/28/21 210 lb (95.3 kg)       Assessment & Plan:   Problem List Items Addressed This Visit       Unprioritized   Type 2 diabetes mellitus with hyperglycemia, without long-term current use of insulin ([place]) - Primary   Relevant Orders   Comp Met (CMET)   Rectal bleeding    New. Recommend CBC.  If increased  frequency of BRBPR over the weekend, pt is advised to go to the ED. Otherwise will see how CT abd looks and have her follow through with planned GI visit on Tuesday.       Relevant Orders   CBC with Differential/Platelet   Generalized abdominal pain    New.  Recommended that we obtain CT abd/pelvis tonight to further evaluate. I recommended that she hold her Sunday Ozempic dose pending GI evaluation on Tuesday as this may be a contributing factor to her GI issues.       Relevant Orders   CT Abdomen Pelvis W Contrast      No orders of the defined types were placed in this encounter.   I, Nance Pear, NP, personally preformed the services described in this documentation.  All medical record entries made by the scribe were at my direction and in my presence.  I have reviewed the chart and discharge instructions (if applicable) and agree that the record reflects my personal performance and is accurate and complete. 11/11/2021   I,Amber Collins,acting as a scribe for Nance Pear, NP.,have documented all relevant documentation on the behalf of Nance Pear, NP,as directed by  Nance Pear, NP while in the presence of Nance Pear, NP.   Nance Pear, NP

## 2021-11-12 ENCOUNTER — Telehealth: Payer: Self-pay | Admitting: Family

## 2021-11-12 LAB — CBC WITH DIFFERENTIAL/PLATELET
Absolute Monocytes: 533 cells/uL (ref 200–950)
Basophils Absolute: 16 cells/uL (ref 0–200)
Basophils Relative: 0.2 %
Eosinophils Absolute: 41 cells/uL (ref 15–500)
Eosinophils Relative: 0.5 %
HCT: 41.2 % (ref 35.0–45.0)
Hemoglobin: 13.7 g/dL (ref 11.7–15.5)
Lymphs Abs: 2722 cells/uL (ref 850–3900)
MCH: 27.7 pg (ref 27.0–33.0)
MCHC: 33.3 g/dL (ref 32.0–36.0)
MCV: 83.4 fL (ref 80.0–100.0)
MPV: 13 fL — ABNORMAL HIGH (ref 7.5–12.5)
Monocytes Relative: 6.5 %
Neutro Abs: 4887 cells/uL (ref 1500–7800)
Neutrophils Relative %: 59.6 %
Platelets: 243 10*3/uL (ref 140–400)
RBC: 4.94 10*6/uL (ref 3.80–5.10)
RDW: 13.1 % (ref 11.0–15.0)
Total Lymphocyte: 33.2 %
WBC: 8.2 10*3/uL (ref 3.8–10.8)

## 2021-11-12 LAB — COMPREHENSIVE METABOLIC PANEL
AG Ratio: 1.6 (calc) (ref 1.0–2.5)
ALT: 22 U/L (ref 6–29)
AST: 24 U/L (ref 10–35)
Albumin: 4.5 g/dL (ref 3.6–5.1)
Alkaline phosphatase (APISO): 74 U/L (ref 37–153)
BUN: 13 mg/dL (ref 7–25)
CO2: 25 mmol/L (ref 20–32)
Calcium: 10 mg/dL (ref 8.6–10.4)
Chloride: 104 mmol/L (ref 98–110)
Creat: 0.95 mg/dL (ref 0.50–1.03)
Globulin: 2.8 g/dL (calc) (ref 1.9–3.7)
Glucose, Bld: 85 mg/dL (ref 65–99)
Potassium: 4.1 mmol/L (ref 3.5–5.3)
Sodium: 141 mmol/L (ref 135–146)
Total Bilirubin: 0.6 mg/dL (ref 0.2–1.2)
Total Protein: 7.3 g/dL (ref 6.1–8.1)

## 2021-11-12 NOTE — Telephone Encounter (Signed)
Spoke to patient. She states that she started the CT but had a bad panic attack and could not complete it. She states she is feeling a little better today.  She plans to repeat the CT on Monday and pre-medicate with xanax.  In the meantime, recommended that she go to the ER if symptoms worsen. Pt verbalizes understanding.  ?

## 2021-11-14 NOTE — Telephone Encounter (Signed)
Per patient spoke to provider over the weekend and she is going to wait until GI appointment tomorrow to schedule CT according to plan of care determined at that visit. ?

## 2021-11-15 DIAGNOSIS — K219 Gastro-esophageal reflux disease without esophagitis: Secondary | ICD-10-CM | POA: Diagnosis not present

## 2021-11-15 DIAGNOSIS — R140 Abdominal distension (gaseous): Secondary | ICD-10-CM | POA: Diagnosis not present

## 2021-11-15 DIAGNOSIS — K625 Hemorrhage of anus and rectum: Secondary | ICD-10-CM | POA: Diagnosis not present

## 2021-11-15 DIAGNOSIS — Z1211 Encounter for screening for malignant neoplasm of colon: Secondary | ICD-10-CM | POA: Diagnosis not present

## 2021-11-22 ENCOUNTER — Ambulatory Visit: Payer: Self-pay | Admitting: Internal Medicine

## 2021-11-22 ENCOUNTER — Telehealth (HOSPITAL_BASED_OUTPATIENT_CLINIC_OR_DEPARTMENT_OTHER): Payer: Self-pay

## 2021-11-29 ENCOUNTER — Inpatient Hospital Stay: Admission: RE | Admit: 2021-11-29 | Payer: BC Managed Care – PPO | Source: Ambulatory Visit

## 2021-12-01 ENCOUNTER — Ambulatory Visit: Payer: BC Managed Care – PPO | Admitting: Dietician

## 2021-12-19 ENCOUNTER — Inpatient Hospital Stay: Admission: RE | Admit: 2021-12-19 | Payer: BC Managed Care – PPO | Source: Ambulatory Visit

## 2021-12-23 ENCOUNTER — Encounter: Payer: Self-pay | Admitting: Internal Medicine

## 2021-12-24 ENCOUNTER — Other Ambulatory Visit: Payer: Self-pay | Admitting: Family

## 2022-01-18 ENCOUNTER — Ambulatory Visit: Payer: BC Managed Care – PPO | Admitting: Internal Medicine

## 2022-01-18 ENCOUNTER — Encounter: Payer: Self-pay | Admitting: Internal Medicine

## 2022-01-18 VITALS — BP 126/88 | HR 100 | Ht 65.0 in | Wt 203.0 lb

## 2022-01-18 DIAGNOSIS — E1165 Type 2 diabetes mellitus with hyperglycemia: Secondary | ICD-10-CM | POA: Diagnosis not present

## 2022-01-18 DIAGNOSIS — R739 Hyperglycemia, unspecified: Secondary | ICD-10-CM

## 2022-01-18 LAB — POCT GLYCOSYLATED HEMOGLOBIN (HGB A1C): Hemoglobin A1C: 5.7 % — AB (ref 4.0–5.6)

## 2022-01-18 MED ORDER — SEMAGLUTIDE (1 MG/DOSE) 4 MG/3ML ~~LOC~~ SOPN: 1.0000 mg | PEN_INJECTOR | SUBCUTANEOUS | 3 refills | Status: DC

## 2022-01-18 NOTE — Patient Instructions (Signed)
-   Stop Semglee  ?- Continue Metformin 500 mg , 1 tablet daily   ?- Increase Ozempic 1 mg weekly  ? ?HOW TO TREAT LOW BLOOD SUGARS (Blood sugar LESS THAN 70 MG/DL) ?Please follow the RULE OF 15 for the treatment of hypoglycemia treatment (when your (blood sugars are less than 70 mg/dL)  ? ?STEP 1: Take 15 grams of carbohydrates when your blood sugar is low, which includes:  ?3-4 GLUCOSE TABS  OR ?3-4 OZ OF JUICE OR REGULAR SODA OR ?ONE TUBE OF GLUCOSE GEL   ? ?STEP 2: RECHECK blood sugar in 15 MINUTES ?STEP 3: If your blood sugar is still low at the 15 minute recheck --> then, go back to STEP 1 and treat AGAIN with another 15 grams of carbohydrates. ? ?

## 2022-01-18 NOTE — Progress Notes (Signed)
?Name: Michele [name]  ?Age/ Sex: 51 y.o., female   ?MRN/ DOB: [id number], 11-03-1970    ? ?PCP: Debbrah Alar, NP   ?Reason for Endocrinology Evaluation: Type 2 Diabetes Mellitus/ MNG  ?Initial Endocrine Consultative Visit: 11/23/2020  ? ? ?PATIENT IDENTIFIER: Michele [name] is a 51 y.o. female with a past medical history of MNG, T2DM, and HTN. The patient has followed with Endocrinology clinic since 11/23/2020 for consultative assistance with management of her diabetes. ? ? ? ? ?DIABETIC HISTORY:  ?Michele [name] was diagnosed with DM in 2016, she was on metformin until 09/2021 when insulin was started due to hyperglycemia. Her hemoglobin A1c has ranged from 5.6% in 09/2020, peaking at 9.6% in 09/2021. ? ?She was started on basal/prandial insulin with an A1c 9.6% in 09/2021 ? ? ? ?THYROID HISTORY: ?She has been diagnosed with MNG in 2012.  She is S/P benign FNA in 2012 of the right thyroid nodule ( 1.9 cm) and left thyroid nodule (1.5 cm)  ?  ?Thyroid FNA was recommended in 2021 but she was lost to follow-up until her return in 2023 ?  ?No  biotin intake  ?No FH of thyroid disease  ? ? ? ?SUBJECTIVE:  ? ?During the last visit (09/28/2021): A1c 9.6%, started prandial insulin, continued metformin and basal insulin ? ?Today (01/18/2022): [name] is here for follow-up on diabetes management and multinodular goiter.  She checks her blood sugars 3-4 times, through dexcom. The patient has not had hypoglycemic episodes since the last clinic visit ? ?Denies nausea, vomiting or diarrhea  ? ?I have ordered FNA of thyroid nodules in February but his has not been done yet  ? ?HOME DIABETES REGIMEN:  ?Metformin 500 mg daily ?Ozempic 0.5 mg weekly  ?Semglee 212 units daily - she has been taking 20 units  ? ? ? ? ?Statin: no ?ACE-I/ARB: yes ? ? ? ?METER DOWNLOAD SUMMARY: unable to download  ?90-131 MG/DL ? ? ? ? ?DIABETIC COMPLICATIONS: ?Microvascular complications:  ? ?Denies: CKD,  neuropathy, retinopathy ?Last Eye Exam: Completed 2023 ? ?Macrovascular complications:  ?Denies: CAD, CVA, PVD ? ? ?HISTORY:  ?Past Medical History:  ?Past Medical History:  ?Diagnosis Date  ? Anemia   ? iron deficient, microcytic, hypochromic  ? Anxiety   ? Chest pain   ? atypical  ? Depression   ? Diabetes mellitus without complication ([place])   ? Fatigue   ? Fatty liver 08/15/2013  ? Fibroids   ? uterine  ? Hypertension   ? Migraines   ? 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 Follows with Dr. Posey Pronto at [facility]- S/p FNA March/april of two dominant nodules- benign.  Following for annual thyroid US with Dr. Posey Pronto.    ? Palpitations   ? recurrent  ? 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 untreated  ? Tachycardia   ? unspecified  ? ?Past Surgical History:  ?Past Surgical History:  ?Procedure Laterality Date  ? APPENDECTOMY  03/2009  ? BIOPSY THYROID  November 28, 2009  ? Dr. Posey Pronto- endo  ? DILATION AND CURETTAGE OF UTERUS  05/21/2011  ? Procedure: DILATATION AND CURETTAGE (D&C);  Surgeon: Elveria Royals;  Location: [facility];  Service: Gynecology;  Laterality: N/A;  dilitation and currettage/endometrial currettings  ? TUBAL LIGATION  1997  ? TUBOPLASTY / TUBOTUBAL ANASTOMOSIS  2005  ? ?Social History:  reports that she has never smoked. She has never used smokeless tobacco. She reports current alcohol use. She reports that she does not use  drugs. ?Family History:  ?Family History  ?Problem Relation Age of Onset  ? Hypertension Mother   ? Cancer Mother 66  ?     breast  ? Cancer Father   ?     colon  ? Stroke Brother   ?     handicapped due to complications from spinal meningitis  ? Seizures Brother   ? Asthma Son   ? Arthritis Maternal Grandmother   ? Hypertension Maternal Grandmother   ? Heart attack Maternal Grandmother   ? Stroke Maternal Grandfather   ? Hypertension Maternal Uncle   ? ? ? ?HOME MEDICATIONS: ?Allergies as of 01/18/2022   ? ?   Reactions  ? Amlodipine Shortness Of Breath, Anxiety, Palpitations, Other (See Comments)  ?  Bee Venom Anaphylaxis  ? Bupropion Shortness Of Breath, Anxiety, Palpitations, Other (See Comments)  ? Clonidine Derivatives   ? dizziness  ? Hydralazine   ? dizziness  ? Imitrex [sumatriptan] Nausea Only  ? Oxycodone Nausea Only  ? ?  ? ?  ?Medication List  ?  ? ?  ? Accurate as of Jan 18, 2022  3:20 PM. If you have any questions, ask your nurse or doctor.  ?  ?  ? ?  ? ?STOP taking these medications   ? ?FreeStyle Libre 2 Sensor Misc ?Stopped by: Dorita Sciara, MD ?  ?insulin glargine-yfgn 100 UNIT/ML Pen ?Commonly known as: SEMGLEE ?Stopped by: Dorita Sciara, MD ?  ? ?  ? ?TAKE these medications   ? ?ALPRAZolam 1 MG tablet ?Commonly known as: Duanne Moron ?Take 1 mg by mouth as needed. For anxiety ?  ?blood glucose meter kit and supplies ?Dispense One touch meter Use up to 2 times daily as directed. (FOR ICD-10 E10.9, E11.9).Dispense based on patient and insurance preference. Use up to 2 times daily  (FOR ICD-10 E10.9, E11.9). ?  ?EpiPen 2-Pak 0.3 mg/0.3 mL Soaj injection ?Generic drug: EPINEPHrine ?daily as needed (FOR ALLERGY). ?  ?glucose blood test strip ?One touch Use two times a day ?  ?hydrochlorothiazide 25 MG tablet ?Commonly known as: HYDRODIURIL ?Take 1 tablet by mouth once daily ?  ?Insulin Aspart FlexPen 100 UNIT/ML ?Commonly known as: NOVOLOG ?4 units with each meal, max daily dose 30 units ?  ?metFORMIN 500 MG 24 hr tablet ?Commonly known as: GLUCOPHAGE-XR ?Take 1 tablet (500 mg total) by mouth daily with breakfast. ?  ?metoprolol succinate 25 MG 24 hr tablet ?Commonly known as: Toprol XL ?Take 1 tablet (25 mg total) by mouth daily. ?  ?onetouch ultrasoft lancets ?Use two times a day ?  ?Pen Needles 30G X 5 MM Misc ?Use one daily for insulin ?  ?rosuvastatin 5 MG tablet ?Commonly known as: Crestor ?Take 1 tablet (5 mg total) by mouth daily. ?  ?sertraline 100 MG tablet ?Commonly known as: ZOLOFT ?Take 100 mg by mouth daily. ?  ?Trulicity 9.24 QA/8.3MH Sopn ?Generic drug: Dulaglutide ?Inject  0.75 mg into the skin once a week. ?  ?valACYclovir 1000 MG tablet ?Commonly known as: VALTREX ?TAKE 2 TABLETS BY MOUTH NOW AND AGAIN IN 12 HOURS ?  ?valsartan 160 MG tablet ?Commonly known as: DIOVAN ?Take 1 tablet by mouth once daily ?  ? ?  ? ? ? ?OBJECTIVE:  ? ?Vital Signs: BP 126/88 (BP Location: Left Arm, Patient Position: Sitting, Cuff Size: Small)   Pulse 100   Ht 5' 5"  (1.651 m)   Wt 203 lb (92.1 kg)   LMP 06/11/2013   SpO2  94%   BMI 33.78 kg/m?   ?Wt Readings from Last 3 Encounters:  ?01/18/22 203 lb (92.1 kg)  ?11/11/21 207 lb (93.9 kg)  ?10/19/21 211 lb (95.7 kg)  ? ? ? ?Exam: ?General: Pt appears well and is in NAD  ?Lungs: Clear with good BS bilat with no rales, rhonchi, or wheezes  ?Heart: RRR with normal S1 and S2 and no gallops; no murmurs; no rub  ?Abdomen: Normoactive bowel sounds, soft, nontender, without masses or organomegaly palpable  ?Extremities: No pretibial edema.  ?Neuro: MS is good with appropriate affect, pt is alert and Ox3  ? ? ?DM foot exam:10/19/2021 ? ?The skin of the feet is intact without sores or ulcerations. ?The pedal pulses are 2+ on right and 2+ on left. ?The sensation is intact to a screening 5.07, 10 gram monofilament bilaterally ? ?  ?   ? ? ? ?DATA REVIEWED: ? ?Lab Results  ?Component Value Date  ? HGBA1C 5.7 (A) 01/18/2022  ? HGBA1C 9.6 (H) 09/26/2021  ? HGBA1C 5.6 09/21/2020  ? ? ? Latest Reference Range & Units 11/11/21 15:36  ?COMPREHENSIVE METABOLIC PANEL  Rpt  ?Sodium 135 - 146 mmol/L 141  ?Potassium 3.5 - 5.3 mmol/L 4.1  ?Chloride 98 - 110 mmol/L 104  ?CO2 20 - 32 mmol/L 25  ?Glucose 65 - 99 mg/dL 85  ?BUN 7 - 25 mg/dL 13  ?Creatinine 0.50 - 1.03 mg/dL 0.95  ?Calcium 8.6 - 10.4 mg/dL 10.0  ?BUN/Creatinine Ratio 6 - 22 (calc) NOT APPLICABLE  ?AG Ratio 1.0 - 2.5 (calc) 1.6  ?AST 10 - 35 U/L 24  ?ALT 6 - 29 U/L 22  ?Total Protein 6.1 - 8.1 g/dL 7.3  ?Total Bilirubin 0.2 - 1.2 mg/dL 0.6  ?Rpt: View report in Results Review for more information ? ? ? ? Latest  Reference Range & Units 10/19/21 15:19  ?Total CHOL/HDL Ratio  5  ?Cholesterol 0 - 200 mg/dL 188  ?HDL Cholesterol >39.00 mg/dL 35.60 (L)  ?LDL (calc) 0 - 99 mg/dL 121 (H)  ?MICROALB/CREAT RATIO 0.0 - 30.0 mg/g 0.6  ?N

## 2022-02-06 ENCOUNTER — Other Ambulatory Visit: Payer: Self-pay | Admitting: Family

## 2022-02-13 ENCOUNTER — Ambulatory Visit (HOSPITAL_BASED_OUTPATIENT_CLINIC_OR_DEPARTMENT_OTHER): Payer: BC Managed Care – PPO | Admitting: Cardiovascular Disease

## 2022-04-18 ENCOUNTER — Telehealth: Payer: Self-pay | Admitting: Family

## 2022-04-18 ENCOUNTER — Ambulatory Visit: Payer: BC Managed Care – PPO | Admitting: Family

## 2022-04-18 VITALS — BP 120/74 | HR 109 | Temp 98.4°F | Resp 16 | Wt 207.0 lb

## 2022-04-18 DIAGNOSIS — R221 Localized swelling, mass and lump, neck: Secondary | ICD-10-CM

## 2022-04-18 DIAGNOSIS — E1165 Type 2 diabetes mellitus with hyperglycemia: Secondary | ICD-10-CM

## 2022-04-18 DIAGNOSIS — I10 Essential (primary) hypertension: Secondary | ICD-10-CM

## 2022-04-18 DIAGNOSIS — R5383 Other fatigue: Secondary | ICD-10-CM

## 2022-04-18 DIAGNOSIS — E042 Nontoxic multinodular goiter: Secondary | ICD-10-CM

## 2022-04-18 NOTE — Telephone Encounter (Signed)
Please request pap from [facility].

## 2022-04-18 NOTE — Assessment & Plan Note (Signed)
New. Will obtain US for further evaluation.  

## 2022-04-18 NOTE — Assessment & Plan Note (Addendum)
BP Readings from Last 3 Encounters:  04/18/22 120/74  01/18/22 126/88  11/11/21 137/85   BP looks great on Diovan. Continue same.

## 2022-04-18 NOTE — Assessment & Plan Note (Signed)
Lab Results  Component Value Date   HGBA1C 5.7 (A) 01/18/2022   HGBA1C 9.6 (H) 09/26/2021   HGBA1C 5.6 09/21/2020   Lab Results  Component Value Date   MICROALBUR 0.8 10/19/2021   LDLCALC 121 (H) 10/19/2021   CREATININE 0.95 11/11/2021   Last A1C looks great- she is managed by endocrinology.

## 2022-04-18 NOTE — Assessment & Plan Note (Signed)
FNA was recommended by Endo which she has not scheduled. I encouraged her to follow through with this.

## 2022-04-18 NOTE — Progress Notes (Addendum)
Subjective:   By signing my name below, I, Michele [name], attest that this documentation has been prepared under the direction and in the presence of Michele Chimera, NP 04/18/2022   Patient ID: Michele [name], female    DOB: 10-22-1970, 51 y.o.   MRN: [id number]  Chief Complaint  Patient presents with   Cyst    Patient reports a knot under rt side of jaw.     HPI Patient is in today for an office visit   Mass (Right Chin): She complains of a possible cyst on her right side of her chin that is becoming progressively bigger. She noticed a symptoms about a month ago. She denies of this affecting her eating.  A1C: Her A1C levels are improving.  Lab Results  Component Value Date   HGBA1C 5.7 (A) 01/18/2022   Blood Pressure: As of today's visit, her blood pressure is normal. She is regularly following up with Dr. Oval Linsey BP Readings from Last 3 Encounters:  04/18/22 120/74  01/18/22 126/88  11/11/21 137/85   Pulse Readings from Last 3 Encounters:  04/18/22 (!) 109  01/18/22 100  11/11/21 72   Colonoscopy: She has not scheduled a colonoscopy yet Vision: She does not recall the last time she received a vision exam.   Immunizations: She is interested in receiving a Shingles vaccine Pap Smear: She reports that she gets a pap smear every year.  Family History: She reports that her brother has had a series of strokes and heart attacks.  Mammogram: Last completed on 07/13/2020 Iron: She is requesting to get her iron levels checked.   Health Maintenance Due  Topic Date Due   COVID-19 Vaccine (1) Never done   Zoster Vaccines- Shingrix (1 of 2) Never done   COLONOSCOPY (Pts 45-16yr Insurance coverage will need to be confirmed)  Never done   FOOT EXAM  10/04/2018   OPHTHALMOLOGY EXAM  06/06/2019   PAP SMEAR-Modifier  04/05/2021   MAMMOGRAM  07/13/2021   INFLUENZA VACCINE  04/11/2022    Past Medical History:  Diagnosis Date   Anemia    iron deficient,  microcytic, hypochromic   Anxiety    Chest pain    atypical   Depression    Diabetes mellitus without complication (HCC)    Fatigue    Fatty liver 08/15/2013   Fibroids    uterine   Hypertension    Migraines    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/april of two dominant nodules- benign.  Following for annual thyroid [place] Dr. 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 PPosey Pronto endo   DILATION AND CURETTAGE OF UTERUS  05/21/2011   Procedure: DILATATION AND CURETTAGE (D&C);  Surgeon: VElveria Royals  [place] [facility]  Service: Gynecology;  Laterality: N/A;  dilitation and currettage/endometrial currettings   TUBAL LIGATION  1997   TUBOPLASTY / TUBOTUBAL ANASTOMOSIS  2005    Family History  Problem Relation Age of Onset   Hypertension Mother    Cancer Mother 420      breast   Cancer Father        colon   Stroke Brother  handicapped due to complications from spinal meningitis   Seizures Brother    Asthma Son    Arthritis Maternal Grandmother    Hypertension Maternal Grandmother    Heart attack Maternal Grandmother    Stroke Maternal Grandfather    Hypertension Maternal Uncle     Social History   Socioeconomic History   Marital status: Married    Spouse name: Warehouse manager   Number of children: 2   Years of education: Not on file   Highest education level: Not on file  Occupational History   Occupation: CLAIMS AGENT    Employer: [employer]  Tobacco Use   Smoking status: Never   Smokeless tobacco: Never   Tobacco comments:    never used tobacco  Substance and Sexual Activity   Alcohol use: Yes    Comment: Once a month   Drug use: No   Sexual activity: Yes    Birth control/protection: None, Surgical  Other Topics Concern    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   Lives with husband, 85 year old son, and granddaughter/grandson   Regular exercise: yes   Daily caffeine: 1-2 daily         Social Determinants of Health   Financial Resource Strain: Not on file  Food Insecurity: Not on file  Transportation Needs: Not on file  Physical Activity: Not on file  Stress: Not on file  Social Connections: Not on file  Intimate Partner Violence: Not on file    Outpatient Medications Prior to Visit  Medication Sig Dispense Refill   ALPRAZolam (XANAX) 1 MG tablet Take 1 mg by mouth as needed. For anxiety     blood glucose meter kit and supplies Dispense One touch meter Use up to 2 times daily as directed. (FOR ICD-10 E10.9, E11.9).Dispense based on patient and insurance preference. Use up to 2 times daily  (FOR ICD-10 E10.9, E11.9). 1 each 0   EPIPEN 2-PAK 0.3 MG/0.3ML SOAJ injection daily as needed (FOR ALLERGY).      glucose blood test strip One touch Use two times a day 100 each 12   hydrochlorothiazide (HYDRODIURIL) 25 MG tablet Take 1 tablet by mouth once daily 90 tablet 0   Insulin Pen Needle (PEN NEEDLES) 30G X 5 MM MISC Use one daily for insulin 50 each 1   Lancets (ONETOUCH ULTRASOFT) lancets Use two times a day 100 each 12   metFORMIN (GLUCOPHAGE-XR) 500 MG 24 hr tablet Take 1 tablet (500 mg total) by mouth daily with breakfast. 90 tablet 3   rosuvastatin (CRESTOR) 5 MG tablet Take 1 tablet (5 mg total) by mouth daily. 90 tablet 3   Semaglutide, 1 MG/DOSE, 4 MG/3ML SOPN Inject 1 mg as directed once a week. 9 mL 3   sertraline (ZOLOFT) 100 MG tablet Take 100 mg by mouth daily.     valACYclovir (VALTREX) 1000 MG tablet TAKE 2 TABLETS BY MOUTH NOW AND AGAIN IN 12 HOURS 4 tablet 0   valsartan (DIOVAN) 160 MG tablet Take 1 tablet by mouth once daily 90 tablet 1   metoprolol succinate (TOPROL XL) 25 MG 24 hr tablet Take 1 tablet (25 mg total) by mouth daily. 90 tablet 3   No  facility-administered medications prior to visit.    Allergies  Allergen Reactions   Amlodipine Shortness Of Breath, Anxiety, Palpitations and Other (See Comments)   Bee Venom Anaphylaxis   Bupropion Shortness Of Breath, Anxiety, Palpitations and Other (See Comments)  Clonidine Derivatives     dizziness   Hydralazine     dizziness   Imitrex [Sumatriptan] Nausea Only   Oxycodone Nausea Only    Review of Systems  Skin:        (+) Mass Right Side of Chin       Objective:    Physical Exam Constitutional:      General: She is not in acute distress.    Appearance: Normal appearance. She is not ill-appearing.  HENT:     Head: Normocephalic and atraumatic.     Right Ear: Tympanic membrane, ear canal and external ear normal.     Left Ear: Tympanic membrane, ear canal and external ear normal.  Eyes:     Extraocular Movements: Extraocular movements intact.     Pupils: Pupils are equal, round, and reactive to light.  Neck:     Comments: Marble sized mass beneath right jaw line Mobile non tender Cardiovascular:     Rate and Rhythm: Normal rate and regular rhythm.     Heart sounds: Normal heart sounds. No murmur heard.    No gallop.  Pulmonary:     Effort: Pulmonary effort is normal. No respiratory distress.     Breath sounds: Normal breath sounds. No wheezing or rales.  Skin:    General: Skin is warm and dry.  Neurological:     Mental Status: She is alert and oriented to person, place, and time.  Psychiatric:        Mood and Affect: Mood normal.        Behavior: Behavior normal.        Judgment: Judgment normal.     BP 120/74 (BP Location: Right Arm, Patient Position: Sitting, Cuff Size: Small)   Pulse (!) 109   Temp 98.4 F (36.9 C) (Oral)   Resp 16   Wt 207 lb (93.9 kg)   LMP 06/11/2013   SpO2 99%   BMI 34.45 kg/m  Wt Readings from Last 3 Encounters:  04/18/22 207 lb (93.9 kg)  01/18/22 203 lb (92.1 kg)  11/11/21 207 lb (93.9 kg)       Assessment & Plan:    Problem List Items Addressed This Visit       Unprioritized   Type 2 diabetes mellitus with hyperglycemia, without long-term current use of insulin (HCC)    Lab Results  Component Value Date   HGBA1C 5.7 (A) 01/18/2022   HGBA1C 9.6 (H) 09/26/2021   HGBA1C 5.6 09/21/2020   Lab Results  Component Value Date   MICROALBUR 0.8 10/19/2021   [place] 121 (H) 10/19/2021   CREATININE 0.95 11/11/2021  Last A1C looks great- she is managed by endocrinology.       Nontoxic multinodular goiter    FNA was recommended by Endo which she has not scheduled. I encouraged her to follow through with this.       Neck mass - Primary    New.  Will obtain US for further evaluation.       Relevant Orders   US Soft Tissue Head/Neck (NON-THYROID)   Fatigue   Relevant Orders   CBC with Differential/Platelet   TSH   Iron, TIBC and Ferritin Panel   Essential hypertension    BP Readings from Last 3 Encounters:  04/18/22 120/74  01/18/22 126/88  11/11/21 137/85  BP looks great on Diovan. Continue same.       No orders of the defined types were placed in this encounter.   I, Nance Pear,  NP, personally preformed the services described in this documentation.  All medical record entries made by the scribe were at my direction and in my presence.  I have reviewed the chart and discharge instructions (if applicable) and agree that the record reflects my personal performance and is accurate and complete. 04/18/2022   I,Amber Collins,acting as a scribe for Nance Pear, NP.,have documented all relevant documentation on the behalf of Nance Pear, NP,as directed by  Nance Pear, NP while in the presence of Nance Pear, NP.    Nance Pear, NP

## 2022-04-18 NOTE — Telephone Encounter (Signed)
And mammogram please.

## 2022-04-18 NOTE — Patient Instructions (Addendum)
Call and schedule colonoscopy with Dr. Collene Mares. Call Dr. Rachael Fee office to schedule eye exam.  Please schedule your thyroid biopsy as recommended by Endocrinology.

## 2022-04-18 NOTE — Telephone Encounter (Signed)
Records release form will be faxed

## 2022-04-19 LAB — CBC WITH DIFFERENTIAL/PLATELET
Basophils Absolute: 0 10*3/uL (ref 0.0–0.1)
Basophils Relative: 0.8 % (ref 0.0–3.0)
Eosinophils Absolute: 0 10*3/uL (ref 0.0–0.7)
Eosinophils Relative: 1 % (ref 0.0–5.0)
HCT: 42.6 % (ref 36.0–46.0)
Hemoglobin: 14.2 g/dL (ref 12.0–15.0)
Lymphocytes Relative: 33.8 % (ref 12.0–46.0)
Lymphs Abs: 1.6 10*3/uL (ref 0.7–4.0)
MCHC: 33.4 g/dL (ref 30.0–36.0)
MCV: 84.1 fl (ref 78.0–100.0)
Monocytes Absolute: 1 10*3/uL (ref 0.1–1.0)
Monocytes Relative: 22.1 % — ABNORMAL HIGH (ref 3.0–12.0)
Neutro Abs: 2 10*3/uL (ref 1.4–7.7)
Neutrophils Relative %: 42.3 % — ABNORMAL LOW (ref 43.0–77.0)
Platelets: 178 10*3/uL (ref 150.0–400.0)
RBC: 5.07 Mil/uL (ref 3.87–5.11)
RDW: 14.1 % (ref 11.5–15.5)
WBC: 4.7 10*3/uL (ref 4.0–10.5)

## 2022-04-19 LAB — IRON,TIBC AND FERRITIN PANEL
%SAT: 18 % (calc) (ref 16–45)
Ferritin: 80 ng/mL (ref 16–232)
Iron: 61 ug/dL (ref 45–160)
TIBC: 343 mcg/dL (calc) (ref 250–450)

## 2022-04-19 LAB — TSH: TSH: 1.75 u[IU]/mL (ref 0.35–5.50)

## 2022-04-20 ENCOUNTER — Ambulatory Visit (HOSPITAL_BASED_OUTPATIENT_CLINIC_OR_DEPARTMENT_OTHER)
Admission: RE | Admit: 2022-04-20 | Discharge: 2022-04-20 | Disposition: A | Discharge status: Home / Self Care | Payer: BC Managed Care – PPO | Source: Ambulatory Visit | Attending: Family | Admitting: Family

## 2022-04-20 DIAGNOSIS — R221 Localized swelling, mass and lump, neck: Secondary | ICD-10-CM

## 2022-04-22 ENCOUNTER — Ambulatory Visit (HOSPITAL_BASED_OUTPATIENT_CLINIC_OR_DEPARTMENT_OTHER): Payer: BC Managed Care – PPO

## 2022-04-24 ENCOUNTER — Ambulatory Visit (HOSPITAL_BASED_OUTPATIENT_CLINIC_OR_DEPARTMENT_OTHER): Admission: RE | Admit: 2022-04-24 | Payer: BC Managed Care – PPO | Source: Ambulatory Visit

## 2022-04-24 ENCOUNTER — Encounter (HOSPITAL_BASED_OUTPATIENT_CLINIC_OR_DEPARTMENT_OTHER): Payer: Self-pay

## 2022-04-27 DIAGNOSIS — E119 Type 2 diabetes mellitus without complications: Secondary | ICD-10-CM | POA: Diagnosis not present

## 2022-04-27 DIAGNOSIS — H52223 Regular astigmatism, bilateral: Secondary | ICD-10-CM | POA: Diagnosis not present

## 2022-04-27 DIAGNOSIS — H04123 Dry eye syndrome of bilateral lacrimal glands: Secondary | ICD-10-CM | POA: Diagnosis not present

## 2022-04-27 DIAGNOSIS — H21233 Degeneration of iris (pigmentary), bilateral: Secondary | ICD-10-CM | POA: Diagnosis not present

## 2022-04-27 DIAGNOSIS — H524 Presbyopia: Secondary | ICD-10-CM | POA: Diagnosis not present

## 2022-04-27 LAB — HM DIABETES EYE EXAM

## 2022-04-28 ENCOUNTER — Other Ambulatory Visit: Payer: Self-pay | Admitting: Family

## 2022-05-08 DIAGNOSIS — F4321 Adjustment disorder with depressed mood: Secondary | ICD-10-CM | POA: Diagnosis not present

## 2022-05-08 DIAGNOSIS — F410 Panic disorder [episodic paroxysmal anxiety] without agoraphobia: Secondary | ICD-10-CM | POA: Diagnosis not present

## 2022-05-22 DIAGNOSIS — Z0142 Encounter for cervical smear to confirm findings of recent normal smear following initial abnormal smear: Secondary | ICD-10-CM | POA: Diagnosis not present

## 2022-05-22 DIAGNOSIS — Z1231 Encounter for screening mammogram for malignant neoplasm of breast: Secondary | ICD-10-CM | POA: Diagnosis not present

## 2022-05-22 DIAGNOSIS — Z01419 Encounter for gynecological examination (general) (routine) without abnormal findings: Secondary | ICD-10-CM | POA: Diagnosis not present

## 2022-05-22 DIAGNOSIS — Z6836 Body mass index (BMI) 36.0-36.9, adult: Secondary | ICD-10-CM | POA: Diagnosis not present

## 2022-05-22 LAB — HM MAMMOGRAPHY

## 2022-05-28 DIAGNOSIS — Z20822 Contact with and (suspected) exposure to covid-19: Secondary | ICD-10-CM | POA: Diagnosis not present

## 2022-05-28 DIAGNOSIS — J069 Acute upper respiratory infection, unspecified: Secondary | ICD-10-CM | POA: Diagnosis not present

## 2022-05-28 DIAGNOSIS — R059 Cough, unspecified: Secondary | ICD-10-CM | POA: Diagnosis not present

## 2022-05-31 LAB — HM PAP SMEAR: HPV, high-risk: NEGATIVE

## 2022-06-14 ENCOUNTER — Ambulatory Visit (HOSPITAL_BASED_OUTPATIENT_CLINIC_OR_DEPARTMENT_OTHER)
Admission: RE | Admit: 2022-06-14 | Discharge: 2022-06-14 | Disposition: A | Discharge status: Home / Self Care | Payer: BC Managed Care – PPO | Source: Ambulatory Visit | Attending: Family | Admitting: Family

## 2022-06-14 ENCOUNTER — Ambulatory Visit: Payer: BC Managed Care – PPO | Admitting: Family

## 2022-06-14 VITALS — BP 140/88 | HR 90 | Temp 98.4°F | Resp 18 | Ht 65.0 in | Wt 212.0 lb

## 2022-06-14 DIAGNOSIS — R0602 Shortness of breath: Secondary | ICD-10-CM | POA: Diagnosis not present

## 2022-06-14 DIAGNOSIS — E042 Nontoxic multinodular goiter: Secondary | ICD-10-CM | POA: Diagnosis not present

## 2022-06-14 DIAGNOSIS — R059 Cough, unspecified: Secondary | ICD-10-CM | POA: Diagnosis not present

## 2022-06-14 MED ORDER — PREDNISONE 10 MG PO TABS: ORAL_TABLET | ORAL | 0 refills | Status: DC

## 2022-06-14 MED ORDER — ALBUTEROL SULFATE HFA 108 (90 BASE) MCG/ACT IN AERS: 2.0000 | INHALATION_SPRAY | Freq: Four times a day (QID) | RESPIRATORY_TRACT | 0 refills | Status: AC | PRN

## 2022-06-14 NOTE — Assessment & Plan Note (Signed)
Advised pt to schedule recommended FNA.

## 2022-06-14 NOTE — Progress Notes (Signed)
Subjective:     Patient ID: Michele [name], female    DOB: 02-09-1971, 51 y.o.   MRN: [id number]  Chief Complaint  Patient presents with   Cough    SOB     Cough    Went to urgent care on 10/17 with cough. Was given steroids, cough syrup. Initially steroids seemed to help but most recently she has noted that her lungs feel tight and she has had increased shortness of breath. She continues to have cough. Using mucinex for cough- though it is not productive.  She was reportedly negative for covid/flu and strep at the urgent care. She completed prednisone on 9/25.   Reports sugars remained stable while on prednisone.  She is planning to schedule her FNA thyroid and colonoscopy.   Health Maintenance Due  Topic Date Due   COVID-19 Vaccine (1) Never done   Zoster Vaccines- Shingrix (1 of 2) Never done   COLONOSCOPY (Pts 45-56yr Insurance coverage will need to be confirmed)  Never done   FOOT EXAM  10/04/2018   PAP SMEAR-Modifier  04/05/2021   MAMMOGRAM  07/13/2021   INFLUENZA VACCINE  04/11/2022    Past Medical History:  Diagnosis Date   Anemia    iron deficient, microcytic, hypochromic   Anxiety    Chest pain    atypical   Depression    [place]    Fatigue    Fatty liver 08/15/2013   Fibroids    uterine   Hypertension    Migraines    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/april of two dominant nodules- benign.  Following for annual thyroid [place] Dr. 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 PPosey Pronto endo   DILATION AND CURETTAGE OF UTERUS  05/21/2011   Procedure: DILATATION AND CURETTAGE (D&C);  Surgeon: VElveria Royals  [facility];  Service: Gynecology;  Laterality: N/A;   dilitation and currettage/endometrial currettings   TUBAL LIGATION  1997   TUBOPLASTY / TUBOTUBAL ANASTOMOSIS  2005    Family History  Problem Relation Age of Onset   Hypertension Mother    Cancer Mother 422      breast   Cancer Father        colon   Stroke Brother        handicapped due to complications from spinal meningitis   Seizures Brother    Asthma Son    Arthritis Maternal Grandmother    Hypertension Maternal Grandmother    Heart attack Maternal Grandmother    Stroke Maternal Grandfather    Hypertension Maternal Uncle     Social History   Socioeconomic History   Marital status: Married    Spouse name: DWarehouse manager  Number of children: 2   Years of education: Not on file   Highest education level: Not on file  Occupational History   Occupation: CLAIMS AGENT    Employer: [employer]  Tobacco Use   Smoking status: Never   Smokeless tobacco: Never   Tobacco comments:    never used tobacco  Substance and Sexual Activity   Alcohol use: Yes    Comment: Once a month   Drug use: No  Sexual activity: Yes    Birth control/protection: None, Surgical  Other Topics Concern   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 For anxiety     blood glucose meter kit and supplies Dispense One touch meter Use up to 2 times daily as directed. (FOR ICD-10 E10.9, E11.9).Dispense based on patient and insurance preference.  Use up to 2 times daily  (FOR ICD-10 E10.9, E11.9). 1 each 0   EPIPEN 2-PAK 0.3 MG/0.3ML SOAJ injection daily as needed (FOR ALLERGY).      glucose blood test strip One touch Use two times a day 100 each 12   hydrochlorothiazide (HYDRODIURIL) 25 MG tablet Take 1 tablet by mouth once daily 90 tablet 1   Insulin Pen Needle (PEN NEEDLES) 30G X 5 MM MISC Use one daily for insulin 50 each 1   Lancets (ONETOUCH ULTRASOFT) lancets Use two times a day 100 each 12   metFORMIN (GLUCOPHAGE-XR) 500 MG 24 hr tablet Take 1 tablet (500 mg total) by mouth daily with breakfast. 90 tablet 3   rosuvastatin (CRESTOR) 5 MG tablet Take 1 tablet (5 mg total) by mouth daily. 90 tablet 3   Semaglutide, 1 MG/DOSE, 4 MG/3ML SOPN Inject 1 mg as directed once a week. 9 mL 3   sertraline (ZOLOFT) 100 MG tablet Take 100 mg by mouth daily.     valACYclovir (VALTREX) 1000 MG tablet TAKE 2 TABLETS BY MOUTH NOW AND AGAIN IN 12 HOURS 4 tablet 0   valsartan (DIOVAN) 160 MG tablet Take 1 tablet by mouth once daily 90 tablet 1   No facility-administered medications prior to visit.    Allergies  Allergen Reactions   Amlodipine Shortness Of Breath, Anxiety, Palpitations and Other (See Comments)   Bee Venom Anaphylaxis   Bupropion Shortness Of Breath, Anxiety, Palpitations and Other (See Comments)   Clonidine Derivatives     dizziness   Hydralazine     dizziness   Imitrex [Sumatriptan] Nausea Only   Oxycodone Nausea Only    Review of Systems  Respiratory:  Positive for cough.        Objective:    Physical Exam Constitutional:      General: She is not in acute distress.    Appearance: Normal appearance. She is well-developed.  HENT:     Head: Normocephalic and atraumatic.     Right Ear: Tympanic membrane, ear canal and external ear normal.     Left Ear: Tympanic membrane, ear canal and external ear normal.     Mouth/Throat:     Pharynx: Posterior oropharyngeal erythema present. No oropharyngeal exudate.      Tonsils: No tonsillar exudate or tonsillar abscesses.  Eyes:     General: No scleral icterus. Neck:     Thyroid: Thyromegaly present.  Cardiovascular:     Rate and Rhythm: Normal rate and regular rhythm.  Heart sounds: Normal heart sounds. No murmur heard. Pulmonary:     Effort: Pulmonary effort is normal. No respiratory distress.     Breath sounds: Normal breath sounds. No wheezing.  Musculoskeletal:     Cervical back: Neck supple.  Skin:    General: Skin is warm and dry.  Neurological:     Mental Status: She is alert and oriented to person, place, and time.  Psychiatric:        Mood and Affect: Mood normal.        Behavior: Behavior normal.        Thought Content: Thought content normal.        Judgment: Judgment normal.     BP (!) 140/88   Pulse 90   Temp 98.4 F (36.9 C)   Resp 18   Ht 5' 5"  (1.651 m)   Wt 212 lb (96.2 kg)   LMP 06/11/2013   SpO2 98%   BMI 35.28 kg/m  Wt Readings from Last 3 Encounters:  06/14/22 212 lb (96.2 kg)  04/18/22 207 lb (93.9 kg)  01/18/22 203 lb (92.1 kg)       Assessment & Plan:   Problem List Items Addressed This Visit       Unprioritized   Nontoxic multinodular goiter    Advised pt to schedule recommended FNA.       Cough - Primary    I suspect bronchospastic component to her symptoms. Will obtain CXR to rule out pneumonia. Repeat steroid taper, add albuterol mdi.  If PNA will plan rx with abx.        Relevant Orders   DG Chest 2 View    I am having Michele [name] start on albuterol and predniSONE. I am also having her maintain her ALPRAZolam, EpiPen 2-Pak, sertraline, valACYclovir, Pen Needles, blood glucose meter kit and supplies, onetouch ultrasoft, glucose blood, metFORMIN, rosuvastatin, valsartan, Semaglutide (1 MG/DOSE), and hydrochlorothiazide.  Meds ordered this encounter  Medications   albuterol (VENTOLIN HFA) 108 (90 Base) MCG/ACT inhaler    Sig: Inhale 2 puffs into the lungs every 6 (six)  hours as needed for wheezing or shortness of breath.    Dispense:  8 g    Refill:  0    Order Specific Question:   Supervising Provider    Answer:   Penni Homans A [[id number]]   predniSONE (DELTASONE) 10 MG tablet    Sig: 4 tabs by mouth once daily for 2 days, then 3 tabs daily x 2 days, then 2 tabs daily x 2 days, then 1 tab daily x 2 days    Dispense:  20 tablet    Refill:  0    Order Specific Question:   Supervising Provider    Answer:   Penni Homans A [[id number]]

## 2022-06-14 NOTE — Assessment & Plan Note (Signed)
I suspect bronchospastic component to her symptoms. Will obtain CXR to rule out pneumonia. Repeat steroid taper, add albuterol mdi.  If PNA will plan rx with abx.

## 2022-07-16 ENCOUNTER — Other Ambulatory Visit: Payer: Self-pay | Admitting: Cardiovascular Disease

## 2022-07-18 ENCOUNTER — Ambulatory Visit: Payer: BC Managed Care – PPO | Admitting: Family

## 2022-07-21 ENCOUNTER — Telehealth (HOSPITAL_BASED_OUTPATIENT_CLINIC_OR_DEPARTMENT_OTHER): Payer: Self-pay | Admitting: Cardiovascular Disease

## 2022-07-21 NOTE — Telephone Encounter (Signed)
Left message for patient to call and schedule over due follow up with Dr. [place]---to refill medications. °

## 2022-07-25 ENCOUNTER — Ambulatory Visit: Payer: BC Managed Care – PPO | Admitting: Internal Medicine

## 2022-08-07 ENCOUNTER — Encounter (HOSPITAL_BASED_OUTPATIENT_CLINIC_OR_DEPARTMENT_OTHER): Payer: Self-pay | Admitting: Cardiovascular Disease

## 2022-08-07 ENCOUNTER — Ambulatory Visit (HOSPITAL_BASED_OUTPATIENT_CLINIC_OR_DEPARTMENT_OTHER): Payer: BC Managed Care – PPO | Admitting: Cardiovascular Disease

## 2022-08-07 VITALS — BP 144/90 | HR 81 | Ht 65.0 in | Wt 213.8 lb

## 2022-08-07 DIAGNOSIS — R079 Chest pain, unspecified: Secondary | ICD-10-CM | POA: Diagnosis not present

## 2022-08-07 DIAGNOSIS — I10 Essential (primary) hypertension: Secondary | ICD-10-CM

## 2022-08-07 DIAGNOSIS — R0602 Shortness of breath: Secondary | ICD-10-CM | POA: Diagnosis not present

## 2022-08-07 DIAGNOSIS — R000 Tachycardia, unspecified: Secondary | ICD-10-CM

## 2022-08-07 MED ORDER — IVABRADINE HCL 5 MG PO TABS: ORAL_TABLET | ORAL | 0 refills | Status: DC

## 2022-08-07 MED ORDER — METOPROLOL TARTRATE 100 MG PO TABS: ORAL_TABLET | ORAL | 0 refills | Status: DC

## 2022-08-07 NOTE — Assessment & Plan Note (Signed)
Coronary CTA as above.  Then continue to focus on diet and exercise.

## 2022-08-07 NOTE — Assessment & Plan Note (Signed)
She has been experiencing more exertional dyspnea lately.  She gets short of breath even with simple activities such as washing her hair in the shower and has to sit and rest.  I suspect this is not ischemic in etiology.  She had a normal stress test 2 years ago.  We will try to get a coronary CTA to ensure we are not missing anything.  Last time she was tachycardic, though her anxiety was more poorly controlled at the time.  We will give her 100 mg of metoprolol and 10 mg of ivabradine prior.  Also recommend that she take her Xanax 1 hour before.

## 2022-08-07 NOTE — Assessment & Plan Note (Signed)
Blood pressure is elevated today both initially and on repeat.  She stopped checking it at home because she gets anxious and it goes up as she checks it.  We will get a 24-hour blood pressure monitor to better understand her true blood pressure.  Goal is less than 130/80.  Continue valsartan and HCTZ.

## 2022-08-07 NOTE — Assessment & Plan Note (Signed)
She is again experiencing tachycardia with minimal exertion.  Heart rate today was better controlled.  She reports overall her anxiety has been better.  We are getting a coronary CTA as above.  If this is within normal limits, recommend trying an alternative heart rate lowering agent as she did not tolerate metoprolol in the past.

## 2022-08-07 NOTE — Progress Notes (Signed)
Cardiology Office Note  Date:  01/01/20  ID:  Michele [name], DOB 09-26-1970, MRN [id number]  PCP:  Debbrah Alar, NP  Cardiologist:   Skeet Latch, MD  No chief complaint on file.  History of Present Illness: Michele [name] is a 51 y.o. female with hypertension, palpitations, diabetes, chronic chest pain, and OSA here for follow up.  She was initially seen 11/2019 for the evaluation of chest pain.  [name] reports having chest pain for years.  She feels like her chest gets tight and her heart starts racing.  It occurs when sitting at her desk.  She can feel her heart pounding in the ears.  Sometimes it even wakes her up from sleep.  She also notes that when she tries to exert herself even minimally her heart starts to race.She started exercising regularly but hasn't seen improvements in her exertional capacity.  She has a [employer] but cannot participate fully because her heart starts racing too fast.  She previously had a sleep study and was told she had mild sleep apnea and did not require a CPAP machine.  She notes that she has gained some weight since this time.  Her blood pressure at home is typically well-controlled.  In the past she was told that her chest pain was related to anxiety.  However she really does not think it is anxiety.  She takes Xanax, which does not help.  She had heart catheterization in 2013 that showed normal coronaries. She last saw Dr. Burt Knack in 2014 and complained of frequent palpitations. Echo 08/2013 revealed LVEF 50 to 55% with mild mitral regurgitation. She was offered a trial of metoprolol but didn't use it.  [name] was referred for a 7 day Event Monitor that showed no arrhythmias.  Her average heart rate was 103 bpm.  She only wore her heart monitor for 3 hours because it made her anxious.  She was referred for a coronary CT-A that could not be completed due to faster heart rates despite metoprolol, ibavardine  and Xanax. She was referred for a nuclear stress test 02/2020 which revealed LVEF 66% and no ischemia. Her blood pressure was poorly controlled. She was switched from losartan to valsartan. She was started on metoprolol due to tachycardia.   She followed up with her PCP and reported frequent falls. She underwent an MRI that was concerning for small vessel disease vs demyelinating disease. She was referred to neurology. She called the office because her blood pressures were elevated 03/2021 after having COVID. Her primary care doctor started her on hydralazine 04/2021. She was seen in September and reported dizziness. Her symptoms were thought to be due to vertigo.   At the last appointment, her blood pressure remained well controlled. Her blood pressure got too low on Valsartan at 330m so it was switched to 1624min the evenings and 8035mn the mornings. Metoprolol was added for tachycardia and palpitations.  Today, she has been experiencing shortness of breath upon exertion. This has been occurring for months. She initially attributed this to her anxiety. It occurs always whenever she is moving or doing activity. She endorses that her arms begin to feel tired whenever these episodes occur. Se notices that it occurs ~4 times/week. She also has associative chest pain with the shortness of breath upon exertion. Her heart rate at home is elevated. She has not been consistent with checking her blood pressure at home. She experiences lower back pain and dizziness as well. She  noted that her anxiety has been well controlled. She denies experiencing any LE edema.  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 Posey Pronto at [facility]- S/p  FNA March/april of two dominant nodules- benign.  Following for annual thyroid US with Dr. Posey Pronto.     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 Posey Pronto- endo   DILATION AND CURETTAGE OF UTERUS  05/21/2011   Procedure: DILATATION AND CURETTAGE (D&C);  Surgeon: Elveria Royals;  Location: [facility];  Service: Gynecology;  Laterality: N/A;  dilitation and currettage/endometrial currettings   TUBAL LIGATION  1997   TUBOPLASTY / TUBOTUBAL ANASTOMOSIS  2005    Current Outpatient Medications  Medication Sig Dispense Refill   albuterol (VENTOLIN HFA) 108 (90 Base) MCG/ACT inhaler Inhale 2 puffs into the lungs every 6 (six) hours as needed for wheezing or shortness of breath. 8 g 0   ALPRAZolam (XANAX) 1 MG tablet Take 1 mg by mouth as needed. For anxiety     blood glucose meter kit and supplies Dispense One touch meter Use up to 2 times daily as directed. (FOR ICD-10 E10.9, E11.9).Dispense based on patient and insurance preference. Use up to 2 times daily  (FOR ICD-10 E10.9, E11.9). 1 each 0   EPIPEN 2-PAK 0.3 MG/0.3ML SOAJ injection daily as needed (FOR ALLERGY).      glucose blood test strip One touch Use two times a day 100 each 12   hydrochlorothiazide (HYDRODIURIL) 25 MG tablet Take 1 tablet by mouth once daily 90 tablet 1   Insulin Pen Needle (PEN NEEDLES) 30G X 5 MM MISC Use one daily for insulin 50 each 1   ivabradine (CORLANOR) 5 MG TABS tablet Take 2 tablets 2 hours prior to ct 2 tablet 0   Lancets (ONETOUCH ULTRASOFT) lancets Use two times a day 100 each 12   metFORMIN (GLUCOPHAGE-XR) 500 MG 24 hr tablet Take 1 tablet (500 mg total) by mouth daily with breakfast. 90 tablet 3   metoprolol tartrate (LOPRESSOR) 100 MG tablet Take 1 tablet 2 hours prior to ct 1 tablet 0   MOUNJARO 5 MG/0.5ML Pen Inject 5 mg into the skin  once a week.     rosuvastatin (CRESTOR) 5 MG tablet Take 1 tablet (5 mg total) by mouth daily. 90 tablet 3   sertraline (ZOLOFT) 100 MG tablet Take 100 mg by mouth daily.     valACYclovir (VALTREX) 1000 MG tablet TAKE 2 TABLETS BY MOUTH NOW AND AGAIN IN 12 HOURS 4 tablet 0   valsartan (DIOVAN) 160 MG tablet Take 1 tablet by mouth once daily 30 tablet 0   No current facility-administered medications for this visit.    Allergies:   Amlodipine, Bee venom, Bupropion, Clonidine derivatives, Hydralazine, Imitrex [sumatriptan], and Oxycodone    Social History:  The patient  reports that she has never smoked. She has never used smokeless tobacco. She reports current alcohol use.  She reports that she does not use drugs.   Family History:  The patient's family history includes Arthritis in her maternal grandmother; Asthma in her son; Cancer in her father; Cancer (age of onset: 19) in her mother; Heart attack in her maternal grandmother; Hypertension in her maternal grandmother, maternal uncle, and mother; Seizures in her brother; Stroke in her brother and maternal grandfather.    ROS:  Please see the history of present illness.   (+) Exertional Chest pain (+) Exertional Shortness of breath (+) Dizziness (+) Low back pain All other systems are reviewed and negative.   PHYSICAL EXAM: VS:  BP (!) 144/90 (BP Location: Right Arm, Patient Position: Sitting, Cuff Size: Large)   Pulse 81   Ht _0  (1.651 m)   Wt 213 lb 12.8 oz (97 kg)   LMP 06/11/2013   BMI 35.58 kg/m  , BMI Body mass index is 35.58 kg/m. GENERAL:  Well appearing HEENT:  Pupils equal round and reactive, fundi not visualized, oral mucosa unremarkable NECK:  No jugular venous distention, waveform within normal limits, carotid upstroke brisk and symmetric, no bruits LUNGS:  Clear to auscultation bilaterally HEART:  RRR.  PMI not displaced or sustained,S1 and S2 within normal limits, no S3, no S4, no clicks, no rubs, no murmurs ABD:   Flat, positive bowel sounds normal in frequency in pitch, no bruits, no rebound, no guarding, no midline pulsatile mass, no hepatomegaly, no splenomegaly EXT:  2 plus pulses throughout, no edema, no cyanosis no clubbing SKIN:  No rashes no nodules NEURO:  Cranial nerves II through XII grossly intact, motor grossly intact throughout PSYCH:  Cognitively intact, oriented to person place and time  EKG: 08/07/22: Sinus rhythm. Rate 81 bpm. 08/15/21: Sinus rhythm, rate 100 bpm 01/01/2020: Sinus rhythm.  91 bpm.  Nonspecific ST changes. 12/05/2019 sinus rhythm.  Rate 97 bpm.  Myocardial Stress Test 02/12/20 The left ventricular ejection fraction is hyperdynamic (>65%). Nuclear stress EF: 66%. Blood pressure demonstrated a hypertensive response to exercise. There was no ST segment deviation noted during stress. The study is normal. This is a low risk study. Normal stress nuclear study with no ischemia or infarction.  Gated ejection fraction 66% with normal wall motion.  Echo 01/21/20  1. Left ventricular ejection fraction, by estimation, is 60 to 65%. The  left ventricle has normal function. The left ventricle has no regional  wall motion abnormalities. There is mild left ventricular hypertrophy.  Left ventricular diastolic parameters  are consistent with Grade I diastolic dysfunction (impaired relaxation).   2. Right ventricular systolic function is hyperdynamic. The right  ventricular size is normal. Tricuspid regurgitation signal is inadequate  for assessing PA pressure.   3. The mitral valve is grossly normal. Trivial mitral valve  regurgitation.   4. The aortic valve is tricuspid. Aortic valve regurgitation is not  visualized.   5. The inferior vena cava is normal in size with greater than 50%  respiratory variability, suggesting right atrial pressure of 3 mmHg.    7 Day Event Monitor 12/2019: Quality: Fair.  Baseline artifact. Predominant rhythm: sinus tachycardia Average heart rate:  103 bpm Max heart rate: 172 bpm Min heart rate: 76 bpm   No arrhythmias   Echo 08/2013: Study Conclusions  - Left ventricle: The cavity size was normal. Wall thickness    was normal. Systolic function was normal. The estimated    ejection fraction was in the range of 50% to 55%. Wall    motion was normal;  there were no regional wall motion    abnormalities. Left ventricular diastolic function    parameters were normal.  - Mitral valve: Mild regurgitation.  - Atrial septum: No defect or patent foramen ovale was    identified.   Renal artery Doppler 12/25/11: normal renal arteries  Recent Labs: 11/11/2021: ALT 22; BUN 13; Creat 0.95; Potassium 4.1; Sodium 141 04/18/2022: Hemoglobin 14.2; Platelets 178.0; TSH 1.75    Lipid Panel    Component Value Date/Time   CHOL 188 10/19/2021 1519   TRIG 158.0 (H) 10/19/2021 1519   HDL 35.60 (L) 10/19/2021 1519   CHOLHDL 5 10/19/2021 1519   VLDL 31.6 10/19/2021 1519   LDLCALC 121 (H) 10/19/2021 1519      Wt Readings from Last 3 Encounters:  08/07/22 213 lb 12.8 oz (97 kg)  06/14/22 212 lb (96.2 kg)  04/18/22 207 lb (93.9 kg)      ASSESSMENT AND PLAN:  Essential hypertension Blood pressure is elevated today both initially and on repeat.  She stopped checking it at home because she gets anxious and it goes up as she checks it.  We will get a 24-hour blood pressure monitor to better understand her true blood pressure.  Goal is less than 130/80.  Continue valsartan and HCTZ.  Shortness of breath She has been experiencing more exertional dyspnea lately.  She gets short of breath even with simple activities such as washing her hair in the shower and has to sit and rest.  I suspect this is not ischemic in etiology.  She had a normal stress test 2 years ago.  We will try to get a coronary CTA to ensure we are not missing anything.  Last time she was tachycardic, though her anxiety was more poorly controlled at the time.  We will give her 100 mg  of metoprolol and 10 mg of ivabradine prior.  Also recommend that she take her Xanax 1 hour before.  Obesity Coronary CTA as above.  Then continue to focus on diet and exercise.  Tachycardia She is again experiencing tachycardia with minimal exertion.  Heart rate today was better controlled.  She reports overall her anxiety has been better.  We are getting a coronary CTA as above.  If this is within normal limits, recommend trying an alternative heart rate lowering agent as she did not tolerate metoprolol in the past.  Current medicines are reviewed at length with the patient today.  The patient does not have concerns regarding medicines.  The following changes have been made:  no change  Labs/ tests ordered today include:   Orders Placed This Encounter  Procedures   CT CORONARY MORPH W/CTA COR W/SCORE W/CA W/CM &/OR WO/CM   Basic metabolic panel   CAR 09UE BLOOD PRESSURE MONITOR   EKG 12-Lead    Disposition:   FU with APP in in 2 months  I,Danny Valdes,acting as a scribe for [place], MD.,have documented all relevant documentation on the behalf of Skeet Latch, MD,as directed by  Skeet Latch, MD while in the presence of Skeet Latch, MD.  I, [place] Oval Linsey, MD have reviewed all documentation for this visit.  The documentation of the exam, diagnosis, procedures, and orders on 08/07/2022 are all accurate and complete.   Signed, Monserath Neff C. Oval Linsey, MD, [facility]  08/07/2022 1:10 PM    [facility]

## 2022-08-07 NOTE — Patient Instructions (Signed)
Medication Instructions:  TAKE METOPROLOL 100 MG AND CORLANOR 5 MG 2 TABLETS 2 HOURS PRIOR TO CT  ALSO TAKE YOUR ALPRAZOLAM PRIOR   *If you need a refill on your cardiac medications before your next appointment, please call your pharmacy*  Lab Work: BMET 1 WEEK PRIOR TO CT   If you have labs (blood work) drawn today and your tests are completely normal, you will receive your results only by: [place] (if you have MyChart) OR A paper copy in the mail If you have any lab test that is abnormal or we need to change your treatment, we will call you to review the results.  Testing/Procedures: Your physician has requested that you have cardiac CT. Cardiac computed tomography (CT) is a painless test that uses an x-ray machine to take clear, detailed pictures of your heart. For further information please visit [email or web address].[email or web address]. Please follow instruction sheet as given.  Follow-Up: At [facility], you and your health needs are our priority.  As part of our continuing mission to provide you with exceptional heart care, we have created designated Provider Care Teams.  These Care Teams include your primary Cardiologist (physician) and Advanced Practice Providers (APPs -  Physician Assistants and Nurse Practitioners) who all work together to provide you with the care you need, when you need it.  We recommend signing up for the patient portal called "MyChart".  Sign up information is provided on this After Visit Summary.  MyChart is used to connect with patients for Virtual Visits (Telemedicine).  Patients are able to view lab/test results, encounter notes, upcoming appointments, etc.  Non-urgent messages can be sent to your provider as well.   To learn more about what you can do with MyChart, go to [email or web address].    Your next appointment:   2 month(s)  The format for your next appointment:   In Person  Provider:   Laurann Montana, NP    [place] BLOOD PRESSURE 24 HOUR MONTIOR  Other Instructions   Your cardiac CT will be scheduled at one of the below locations:   [facility] [address] [place], [place] [address] [phone]  [place], [place] [address] [phone]  [facility] [place], [place] [address] [phone]  If scheduled at [facility], please arrive at the [facility] and [facility]'s Entrance (Entrance C2) of [facility] 30 minutes prior to test start time. You can use the FREE valet parking offered at entrance C (encouraged to control the heart rate for the test)  Proceed to the [facility] Radiology Department (first floor) to check-in and test prep.  All radiology patients and guests should use entrance C2 at [facility], accessed from [facility], even though the hospital's physical address listed is [address].    If scheduled at [facility] or [facility], please arrive 15 mins early for check-in and test prep.   Please follow these instructions carefully (unless otherwise directed):  Hold all erectile dysfunction medications at least 3 days (72 hrs) prior to test. (Ie viagra, cialis, sildenafil, tadalafil, etc) We will administer nitroglycerin during this exam.   On the Night Before the Test: Be sure to Drink plenty of water. Do not consume any caffeinated/decaffeinated beverages or chocolate 12 hours prior to your test. Do not  take any antihistamines 12 hours prior to your test.  On the Day of the Test: Drink plenty of water until 1 hour prior to the test. Do not eat any food 1 hour prior to test. You may take your regular medications prior to the test.  Take metoprolol (Lopressor) two hours prior to test. HOLD Furosemide/Hydrochlorothiazide morning  of the test. FEMALES- please wear underwire-free bra if available, avoid dresses & tight clothing      After the Test: Drink plenty of water. After receiving IV contrast, you may experience a mild flushed feeling. This is normal. On occasion, you may experience a mild rash up to 24 hours after the test. This is not dangerous. If this occurs, you can take Benadryl 25 mg and increase your fluid intake. If you experience trouble breathing, this can be serious. If it is severe call 911 IMMEDIATELY. If it is mild, please call our office. If you take any of these medications: Glipizide/Metformin, Avandament, Glucavance, please do not take 48 hours after completing test unless otherwise instructed.  We will call to schedule your test 2-4 weeks out understanding that some insurance companies will need an authorization prior to the service being performed.   For non-scheduling related questions, please contact the cardiac imaging nurse navigator should you have any questions/concerns: Michele [name], Cardiac Imaging Nurse Navigator Michele [name], Cardiac Imaging Nurse Navigator [facility] Direct Office Dial: [phone]   For scheduling needs, including cancellations and rescheduling, please call Michele [name], [phone].

## 2022-08-10 ENCOUNTER — Inpatient Hospital Stay: Admission: RE | Admit: 2022-08-10 | Payer: BC Managed Care – PPO | Source: Ambulatory Visit

## 2022-08-11 ENCOUNTER — Other Ambulatory Visit: Payer: Self-pay | Admitting: Cardiovascular Disease

## 2022-08-14 DIAGNOSIS — M5412 Radiculopathy, cervical region: Secondary | ICD-10-CM | POA: Diagnosis not present

## 2022-08-14 DIAGNOSIS — M79601 Pain in right arm: Secondary | ICD-10-CM | POA: Diagnosis not present

## 2022-08-16 DIAGNOSIS — M5030 Other cervical disc degeneration, unspecified cervical region: Secondary | ICD-10-CM | POA: Diagnosis not present

## 2022-08-16 DIAGNOSIS — M4722 Other spondylosis with radiculopathy, cervical region: Secondary | ICD-10-CM | POA: Diagnosis not present

## 2022-08-16 DIAGNOSIS — M4802 Spinal stenosis, cervical region: Secondary | ICD-10-CM | POA: Diagnosis not present

## 2022-08-16 DIAGNOSIS — M47812 Spondylosis without myelopathy or radiculopathy, cervical region: Secondary | ICD-10-CM | POA: Diagnosis not present

## 2022-08-23 ENCOUNTER — Telehealth (HOSPITAL_COMMUNITY): Payer: Self-pay | Admitting: *Deleted

## 2022-08-23 NOTE — Telephone Encounter (Signed)
Attempted to call patient regarding upcoming cardiac CT appointment and reminder for blood work prior to CT appt. Left message on voicemail with name and callback number  Gordy Clement RN Navigator Cardiac [facility] [phone] Office [phone] Cell

## 2022-08-24 ENCOUNTER — Telehealth (HOSPITAL_COMMUNITY): Payer: Self-pay | Admitting: *Deleted

## 2022-08-24 NOTE — Telephone Encounter (Signed)
Reaching out to patient to offer assistance regarding upcoming cardiac imaging study; pt verbalizes understanding of appt date/time, but she wishes to reschedule.  New appointment made for Thursday, December 21 at 8am. She is aware to obtain labs prior to new appointment and that the RN navigators will reach out closer to new appointment date. Patient verbalized understanding.  Gordy Clement RN Navigator Cardiac Imaging [facility] [phone] office [phone] cell

## 2022-08-25 ENCOUNTER — Ambulatory Visit (HOSPITAL_COMMUNITY): Admission: RE | Admit: 2022-08-25 | Payer: BC Managed Care – PPO | Source: Ambulatory Visit

## 2022-08-29 ENCOUNTER — Inpatient Hospital Stay: Admission: RE | Admit: 2022-08-29 | Payer: BC Managed Care – PPO | Source: Ambulatory Visit

## 2022-08-30 ENCOUNTER — Telehealth (HOSPITAL_COMMUNITY): Payer: Self-pay | Admitting: *Deleted

## 2022-08-30 NOTE — Telephone Encounter (Signed)
Attempted to call patient regarding upcoming cardiac CT appointment. °Left message on voicemail with name and callback number ° °Nayib Remer RN Navigator Cardiac Imaging °[facility] °[phone] Office °[phone] Cell ° °

## 2022-08-31 ENCOUNTER — Ambulatory Visit (HOSPITAL_COMMUNITY): Admission: RE | Admit: 2022-08-31 | Payer: BC Managed Care – PPO | Source: Ambulatory Visit

## 2022-09-06 ENCOUNTER — Telehealth (HOSPITAL_COMMUNITY): Payer: Self-pay | Admitting: Emergency Medicine

## 2022-09-06 NOTE — Telephone Encounter (Signed)
Attempted to call patient regarding upcoming cardiac CT appointment. °Left message on voicemail with name and callback number °Joniya Boberg RN Navigator Cardiac Imaging °[facility] °[phone] Office °[phone] Cell ° °

## 2022-09-07 ENCOUNTER — Ambulatory Visit (HOSPITAL_COMMUNITY): Admission: RE | Admit: 2022-09-07 | Payer: BC Managed Care – PPO | Source: Ambulatory Visit

## 2022-10-05 ENCOUNTER — Telehealth: Payer: Self-pay | Admitting: *Deleted

## 2022-10-05 NOTE — Telephone Encounter (Signed)
Please call Michele [name] in the monitor department of [facility] to schedule a 24 hour ambulatory blood pressure monitor.  Patient has follow up appointment at [facility] with Laurann Montana 10/09/22, 0800 AM.

## 2022-10-09 ENCOUNTER — Ambulatory Visit (HOSPITAL_BASED_OUTPATIENT_CLINIC_OR_DEPARTMENT_OTHER): Payer: BC Managed Care – PPO | Admitting: Family

## 2022-10-09 NOTE — Progress Notes (Deleted)
Office Visit    Patient Name: Michele [name] Date of Encounter: 10/09/2022  PCP:  Debbrah Alar, NP   [place]  Cardiologist:  Skeet Latch, MD  Advanced Practice Provider:  No care team member to display Electrophysiologist:  None      Chief Complaint    Michele [name] is a 52 y.o. female presents today for follow up of exertional dyspnea, hypertension.    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 Posey Pronto at [facility]- S/p FNA March/april of two dominant nodules- benign.  Following for annual thyroid US with Dr. Posey Pronto.     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 Posey Pronto- endo   DILATION AND CURETTAGE OF UTERUS  05/21/2011   Procedure: DILATATION AND CURETTAGE (D&C);  Surgeon: Elveria Royals;  Location: [facility];  Service: Gynecology;  Laterality: N/A;  dilitation and currettage/endometrial currettings   TUBAL LIGATION  1997   TUBOPLASTY / TUBOTUBAL ANASTOMOSIS  2005    Allergies  Allergies  Allergen Reactions   Amlodipine Shortness Of Breath, Anxiety, Palpitations and Other (See Comments)   Bee Venom Anaphylaxis   Bupropion Shortness Of Breath, Anxiety, Palpitations and Other (See Comments)   Clonidine Derivatives     dizziness   Hydralazine     dizziness   Imitrex [Sumatriptan] Nausea Only   Oxycodone Nausea Only    History of Present Illness    Michele [name] is a 52 y.o. female with a hx of hypertension,  palpitations, vertigo, diabetes, chronic chest pain, OSA last seen 08/07/22.  Initial evaluation March 2021 for chest pain.  This is ongoing for years.  Feels like chest is tight and her heart is racing.  Cardiac catheterization 2013 with normal coronaries.  Echo 08/2013 LVEF 50 to 55% with mild MR.  She was offered a trial of metoprolol but did not use it.  7-day event monitor with no arrhythmias and average heart rate 103 bpm.  She only wore the heart monitor 3 hours because it made her anxious.  Coronary CTA ordered but could not be completed due to faster heart rates despite metoprolol, ivabradine, Xanax.  Nuclear stress test 02/2020 LVEF 66% and no ischemia.  Due to poor blood pressure control losartan switched to valsartan.  Metoprolol added due to tachycardia.  She was started on hydralazine/2022 by primary care due to elevated blood pressure.  Previous sleep study showed mild sleep apnea and she was told she did not require CPAP.  Last seen 08/07/2022.  Blood pressure got too low and valsartan 320 so it was reduced to 160 in the evenings and 80 mg in the morning.  Metoprolol added for tachycardia and palpitations.  At clinic visit she noted exertional shortness of breath.  Cardiac CTA ordered which she has cancelled or no showed 3x.  24-hour blood pressure monitor ordered but not  performed.  EKGs/Labs/Other Studies Reviewed:   The following studies were reviewed today: Myocardial Stress Test 02/12/20 The left ventricular ejection fraction is hyperdynamic (>65%). Nuclear stress EF: 66%. Blood pressure demonstrated a hypertensive response to exercise. There was no ST segment deviation noted during stress. The study is normal. This is a low risk study. Normal stress nuclear study with no ischemia or infarction.  Gated ejection fraction 66% with normal wall motion.   Echo 01/21/20  1. Left ventricular ejection fraction, by estimation, is 60 to 65%. The  left ventricle has normal function. The  left ventricle has no regional  wall motion abnormalities. There is mild left ventricular hypertrophy.  Left ventricular diastolic parameters  are consistent with Grade I diastolic dysfunction (impaired relaxation).   2. Right ventricular systolic function is hyperdynamic. The right  ventricular size is normal. Tricuspid regurgitation signal is inadequate  for assessing PA pressure.   3. The mitral valve is grossly normal. Trivial mitral valve  regurgitation.   4. The aortic valve is tricuspid. Aortic valve regurgitation is not  visualized.   5. The inferior vena cava is normal in size with greater than 50%  respiratory variability, suggesting right atrial pressure of 3 mmHg.        7 Day Event Monitor 12/2019: Quality: Fair.  Baseline artifact. Predominant rhythm: sinus tachycardia Average heart rate: 103 bpm Max heart rate: 172 bpm Min heart rate: 76 bpm   No arrhythmias   Echo 08/2013: Study Conclusions  - Left ventricle: The cavity size was normal. Wall thickness    was normal. Systolic function was normal. The estimated    ejection fraction was in the range of 50% to 55%. Wall    motion was normal; there were no regional wall motion    abnormalities. Left ventricular diastolic function    parameters were normal.  - Mitral valve: Mild regurgitation.  - Atrial septum: No defect or patent foramen ovale was    identified.    Renal artery Doppler 12/25/11: normal renal arteries  EKG:  EKG is ordered today.  The ekg ordered today demonstrates ***  Recent Labs: 11/11/2021: ALT 22; BUN 13; Creat 0.95; Potassium 4.1; Sodium 141 04/18/2022: Hemoglobin 14.2; Platelets 178.0; TSH 1.75  Recent Lipid Panel    Component Value Date/Time   CHOL 188 10/19/2021 1519   TRIG 158.0 (H) 10/19/2021 1519   HDL 35.60 (L) 10/19/2021 1519   CHOLHDL 5 10/19/2021 1519   VLDL 31.6 10/19/2021 1519   LDLCALC 121 (H) 10/19/2021 1519   Home Medications   No outpatient medications have been  marked as taking for the 10/09/22 encounter (Appointment) with Loel Dubonnet, NP.     Review of Systems      All other systems reviewed and are otherwise negative except as noted above.  Physical Exam    VS:  LMP 06/11/2013  , BMI There is no height or weight on file to calculate BMI.  Wt Readings from Last 3 Encounters:  08/07/22 213 lb 12.8 oz (97 kg)  06/14/22 212 lb (96.2 kg)  04/18/22 207 lb (93.9 kg)     GEN: Well nourished, well developed, in no acute distress. HEENT: normal. Neck: Supple, no JVD, carotid bruits, or masses. Cardiac: ***RRR, no murmurs, rubs, or gallops. No clubbing, cyanosis, edema.  ***Radials/PT 2+ and equal bilaterally.  Respiratory:  ***Respirations regular and unlabored, clear to auscultation bilaterally. GI: Soft, nontender, nondistended. MS: No deformity or atrophy. Skin: Warm and dry, no rash. Neuro:  Strength and sensation are intact. Psych: Normal affect.  Assessment & Plan    HTN-  Shortness of breath-  Obesity-  Tachycardia-  No BP recorded.  {Refresh Note OR Click here to enter BP  :1}***      Disposition: Follow up {follow up:15908} with Skeet Latch, MD or APP.  Signed, Loel Dubonnet, NP 10/09/2022, 7:53 AM [place]

## 2022-11-08 NOTE — Telephone Encounter (Signed)
Opened in error

## 2022-11-15 DIAGNOSIS — F410 Panic disorder [episodic paroxysmal anxiety] without agoraphobia: Secondary | ICD-10-CM | POA: Diagnosis not present

## 2022-11-15 DIAGNOSIS — F4322 Adjustment disorder with anxiety: Secondary | ICD-10-CM | POA: Diagnosis not present

## 2023-01-06 ENCOUNTER — Other Ambulatory Visit: Payer: Self-pay | Admitting: Internal Medicine

## 2023-02-27 ENCOUNTER — Other Ambulatory Visit: Payer: Self-pay | Admitting: Family

## 2023-02-27 NOTE — Telephone Encounter (Signed)
Please contact pt to schedule a follow up visit.  

## 2023-02-28 NOTE — Telephone Encounter (Signed)
Lvm to sched.

## 2023-03-19 ENCOUNTER — Ambulatory Visit: Payer: BC Managed Care – PPO | Admitting: Family

## 2023-03-19 ENCOUNTER — Encounter: Payer: Self-pay | Admitting: Family

## 2023-03-19 ENCOUNTER — Other Ambulatory Visit (HOSPITAL_BASED_OUTPATIENT_CLINIC_OR_DEPARTMENT_OTHER): Payer: Self-pay

## 2023-03-19 ENCOUNTER — Telehealth: Payer: Self-pay | Admitting: Family

## 2023-03-19 ENCOUNTER — Encounter: Payer: Self-pay | Admitting: Hematology & Oncology

## 2023-03-19 VITALS — BP 121/83 | HR 81 | Ht 65.0 in | Wt 215.0 lb

## 2023-03-19 DIAGNOSIS — F32A Depression, unspecified: Secondary | ICD-10-CM

## 2023-03-19 DIAGNOSIS — Z23 Encounter for immunization: Secondary | ICD-10-CM

## 2023-03-19 DIAGNOSIS — E785 Hyperlipidemia, unspecified: Secondary | ICD-10-CM | POA: Diagnosis not present

## 2023-03-19 DIAGNOSIS — I10 Essential (primary) hypertension: Secondary | ICD-10-CM | POA: Diagnosis not present

## 2023-03-19 DIAGNOSIS — G4733 Obstructive sleep apnea (adult) (pediatric): Secondary | ICD-10-CM

## 2023-03-19 DIAGNOSIS — Z7984 Long term (current) use of oral hypoglycemic drugs: Secondary | ICD-10-CM

## 2023-03-19 DIAGNOSIS — E1165 Type 2 diabetes mellitus with hyperglycemia: Secondary | ICD-10-CM

## 2023-03-19 DIAGNOSIS — F419 Anxiety disorder, unspecified: Secondary | ICD-10-CM

## 2023-03-19 LAB — COMPREHENSIVE METABOLIC PANEL
ALT: 29 U/L (ref 0–35)
AST: 29 U/L (ref 0–37)
Albumin: 4.5 g/dL (ref 3.5–5.2)
Alkaline Phosphatase: 65 U/L (ref 39–117)
BUN: 13 mg/dL (ref 6–23)
CO2: 29 mEq/L (ref 19–32)
Calcium: 9.8 mg/dL (ref 8.4–10.5)
Chloride: 101 mEq/L (ref 96–112)
Creatinine, Ser: 0.91 mg/dL (ref 0.40–1.20)
GFR: 72.04 mL/min (ref >60.00)
Glucose, Bld: 101 mg/dL — ABNORMAL HIGH (ref 70–99)
Potassium: 4.6 mEq/L (ref 3.5–5.1)
Sodium: 137 mEq/L (ref 135–145)
Total Bilirubin: 1 mg/dL (ref 0.2–1.2)
Total Protein: 7.4 g/dL (ref 6.0–8.3)

## 2023-03-19 LAB — HEMOGLOBIN A1C: Hgb A1c MFr Bld: 6.6 % — ABNORMAL HIGH (ref 4.6–6.5)

## 2023-03-19 LAB — LIPID PANEL
Cholesterol: 139 mg/dL (ref 0–200)
HDL: 30.2 mg/dL — ABNORMAL LOW (ref >39.00)
LDL Cholesterol: 93 mg/dL (ref 0–99)
NonHDL: 109.22
Total CHOL/HDL Ratio: 5
Triglycerides: 80 mg/dL (ref 0.0–149.0)
VLDL: 16 mg/dL (ref 0.0–40.0)

## 2023-03-19 MED ORDER — METFORMIN HCL ER 500 MG PO TB24: 500.0000 mg | ORAL_TABLET | Freq: Every day | ORAL | 3 refills | Status: DC

## 2023-03-19 MED ORDER — VALACYCLOVIR HCL 1 G PO TABS: ORAL_TABLET | ORAL | 3 refills | Status: DC

## 2023-03-19 MED ORDER — BUSPIRONE HCL 15 MG PO TABS: 15.0000 mg | ORAL_TABLET | Freq: Two times a day (BID) | ORAL | Status: AC

## 2023-03-19 MED ORDER — HYDROCHLOROTHIAZIDE 25 MG PO TABS: 25.0000 mg | ORAL_TABLET | Freq: Every day | ORAL | 1 refills | Status: DC

## 2023-03-19 MED ORDER — MOUNJARO 2.5 MG/0.5ML ~~LOC~~ SOAJ
2.5000 mg | SUBCUTANEOUS | 0 refills | Status: DC
  Filled 2023-03-19: qty 2, 28d supply, fill #0

## 2023-03-19 MED ORDER — DEXCOM G7 SENSOR MISC: 5 refills | Status: AC

## 2023-03-19 NOTE — Assessment & Plan Note (Signed)
Off medication for approximately 90 days. Previously on metformin and Ozempic, but experienced side effects with Ozempic. -Resume metformin extended release. -Start Mounjaro 2.5mg , with plan to increase to 5mg  if well tolerated. -Check A1c today and recheck in 3 months.

## 2023-03-19 NOTE — Assessment & Plan Note (Deleted)
B

## 2023-03-19 NOTE — Assessment & Plan Note (Signed)
Well controlled on current regimen of hydrochlorothiazide and valsartan. -Continue current medications.

## 2023-03-19 NOTE — Telephone Encounter (Signed)
Please call [facility] and request copy of Pap and mammo.

## 2023-03-19 NOTE — Progress Notes (Signed)
Subjective:     Patient ID: Michele [name], female    DOB: Jun 03, 1971, 52 y.o.   MRN: [id number]  Chief Complaint  Patient presents with   Medication Refill    HPI  Discussed the use of AI scribe software for clinical note transcription with the patient, who gave verbal consent to proceed.  History of Present Illness   The patient, with a history of hypertension, diabetes, and sleep apnea, presents for a follow-up on her medications. She reports that her blood pressure has been well-controlled recently, which she attributes to taking her medication regularly.   The patient has been experiencing daily headaches.  She has stopped taking BC powders for these headaches, but instead has been using aleve and tylenol on a daily basis. She also reports that she has not been using her CPAP machine for her sleep apnea, which could be contributing to her headaches.  The patient has been out of her diabetes medication for approximately 90 days and is out of her Dexcom. She was previously on metformin extended release, which she tolerated well, and Ozempic, which caused too many side effects. She expresses interest in trying [facility] for her diabetes management.  She has not had an eye exam since March 23. She is also due for a colonoscopy and has not been using her CPAP machine for her sleep apnea.      BP Readings from Last 3 Encounters:  03/19/23 121/83  08/07/22 (!) 144/90  06/14/22 (!) 140/88       Health Maintenance Due  Topic Date Due   COVID-19 Vaccine (1) Never done   Colonoscopy  Never done   FOOT EXAM  10/04/2018   PAP SMEAR-Modifier  04/05/2021   MAMMOGRAM  07/13/2021   HEMOGLOBIN A1C  07/21/2022   Diabetic kidney evaluation - Urine ACR  10/19/2022   Diabetic kidney evaluation - eGFR measurement  11/12/2022    Past Medical History:  Diagnosis Date   Anemia    iron deficient, microcytic, hypochromic   Anxiety    Chest pain    atypical   Depression     Diabetes mellitus without complication (HCC)    Fatigue    Fatty liver 08/15/2013   Fibroids    uterine   Migraines    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 Allena Katz at [facility]- S/p FNA March/april of two dominant nodules- benign.  Following for annual thyroid US with Dr. Allena Katz.     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 Allena Katz- endo   DILATION AND CURETTAGE OF UTERUS  05/21/2011   Procedure: DILATATION AND CURETTAGE (D&C);  Surgeon: Robley Fries;  Location: [facility];  Service: Gynecology;  Laterality: N/A;  dilitation and currettage/endometrial currettings   TUBAL LIGATION  1997   TUBOPLASTY / TUBOTUBAL ANASTOMOSIS  2005    Family History  Problem Relation Age of Onset   Hypertension Mother    Cancer Mother 42       breast   Cancer Father        colon   Stroke Brother        handicapped due to complications from spinal meningitis   Seizures Brother    Asthma Son    Arthritis Maternal Grandmother  Hypertension Maternal Grandmother    Heart attack Maternal Grandmother    Stroke Maternal Grandfather    Hypertension Maternal Uncle     Social History   Socioeconomic History   Marital status: Married    Spouse name: Customer service manager   Number of children: 2   Years of education: Not on file   Highest education level: Not on file  Occupational History   Occupation: CLAIMS AGENT    Employer: [employer]  Tobacco Use   Smoking status: Never   Smokeless tobacco: Never   Tobacco comments:    never used tobacco  Substance and Sexual Activity   Alcohol use: Yes    Comment: Once a month   Drug use: No   Sexual activity: Yes    Birth control/protection: None, Surgical  Other Topics Concern   Not on file  Social History Narrative   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   Lives  with husband, 67 year old son, and granddaughter/grandson   Regular exercise: yes   Daily caffeine: 1-2 daily         Social Determinants of Health   Financial Resource Strain: Not on file  Food Insecurity: Not on file  Transportation Needs: Not on file  Physical Activity: Not on file  Stress: Not on file  Social Connections: Not on file  Intimate Partner Violence: Not on file    Outpatient Medications Prior to Visit  Medication Sig Dispense Refill   albuterol (VENTOLIN HFA) 108 (90 Base) MCG/ACT inhaler Inhale 2 puffs into the lungs every 6 (six) hours as needed for wheezing or shortness of breath. 8 g 0   ALPRAZolam (XANAX) 1 MG tablet Take 1 mg by mouth as needed. For anxiety     blood glucose meter kit and supplies Dispense One touch meter Use up to 2 times daily as directed. (FOR ICD-10 E10.9, E11.9).Dispense based on patient and insurance preference. Use up to 2 times daily  (FOR ICD-10 E10.9, E11.9). 1 each 0   EPIPEN 2-PAK 0.3 MG/0.3ML SOAJ injection daily as needed (FOR ALLERGY).      glucose blood test strip One touch Use two times a day 100 each 12   Insulin Pen Needle (PEN NEEDLES) 30G X 5 MM MISC Use one daily for insulin 50 each 1   ivabradine (CORLANOR) 5 MG TABS tablet Take 2 tablets 2 hours prior to ct 2 tablet 0   Lancets (ONETOUCH ULTRASOFT) lancets Use two times a day 100 each 12   metoprolol tartrate (LOPRESSOR) 100 MG tablet Take 1 tablet 2 hours prior to ct 1 tablet 0   rosuvastatin (CRESTOR) 5 MG tablet Take 1 tablet (5 mg total) by mouth daily. 90 tablet 3   sertraline (ZOLOFT) 100 MG tablet Take 100 mg by mouth daily.     valsartan (DIOVAN) 160 MG tablet Take 1 tablet by mouth once daily 90 tablet 2   hydrochlorothiazide (HYDRODIURIL) 25 MG tablet Take 1 tablet by mouth once daily 30 tablet 0   metFORMIN (GLUCOPHAGE-XR) 500 MG 24 hr tablet Take 1 tablet (500 mg total) by mouth daily with breakfast. 90 tablet 3   [name] 5 MG/0.5ML Pen Inject 5 mg into the  skin once a week.     valACYclovir (VALTREX) 1000 MG tablet TAKE 2 TABLETS BY MOUTH NOW AND AGAIN IN 12 HOURS 4 tablet 0   No facility-administered medications prior to visit.    Allergies  Allergen Reactions   Amlodipine Shortness Of Breath, Anxiety, Palpitations and Other (  See Comments)   Bee Venom Anaphylaxis   Bupropion Shortness Of Breath, Anxiety, Palpitations and Other (See Comments)   Clonidine Derivatives     dizziness   Hydralazine     dizziness   Imitrex [Sumatriptan] Nausea Only   Oxycodone Nausea Only    ROS See HPI    Objective:    Physical Exam Constitutional:      General: She is not in acute distress.    Appearance: Normal appearance. She is well-developed.  HENT:     Head: Normocephalic and atraumatic.     Right Ear: External ear normal.     Left Ear: External ear normal.  Eyes:     General: No scleral icterus. Neck:     Thyroid: No thyromegaly.  Cardiovascular:     Rate and Rhythm: Normal rate and regular rhythm.     Heart sounds: Normal heart sounds. No murmur heard. Pulmonary:     Effort: Pulmonary effort is normal. No respiratory distress.     Breath sounds: Normal breath sounds. No wheezing.  Musculoskeletal:     Cervical back: Neck supple.  Skin:    General: Skin is warm and dry.  Neurological:     Mental Status: She is alert and oriented to person, place, and time.  Psychiatric:        Mood and Affect: Mood normal.        Behavior: Behavior normal.        Thought Content: Thought content normal.        Judgment: Judgment normal.      BP 121/83 (BP Location: Left Arm, Patient Position: Sitting, Cuff Size: Large)   Pulse 81   Ht 5\' 5"  (1.651 m)   Wt 215 lb (97.5 kg)   LMP 06/11/2013   SpO2 100%   BMI 35.78 kg/m  Wt Readings from Last 3 Encounters:  03/19/23 215 lb (97.5 kg)  08/07/22 213 lb 12.8 oz (97 kg)  06/14/22 212 lb (96.2 kg)       Assessment & Plan:   Problem List Items Addressed This Visit       Unprioritized    Type 2 diabetes mellitus with hyperglycemia, without long-term current use of insulin (HCC)     Off medication for approximately 90 days. Previously on metformin and Ozempic, but experienced side effects with Ozempic. -Resume metformin extended release. -Start [name] 2.5mg , with plan to increase to 5mg  if well tolerated. -Check A1c today and recheck in 3 months.      Relevant Medications   metFORMIN (GLUCOPHAGE-XR) 500 MG 24 hr tablet   tirzepatide ([name]) 2.5 MG/0.5ML Pen   Continuous Glucose Sensor (DEXCOM G7 SENSOR) MISC   Other Relevant Orders   HgB A1c   Comp Met (CMET)   OSA (obstructive sleep apnea)    Non-compliant with CPAP. Daily headaches, possibly related to untreated sleep apnea. -Refer to sleep specialist for possible new treatment options and potential CPAP machine update.      Relevant Orders   Ambulatory referral to Pulmonology   Essential hypertension - Primary     Well controlled on current regimen of hydrochlorothiazide and valsartan. -Continue current medications.      Relevant Medications   hydrochlorothiazide (HYDRODIURIL) 25 MG tablet   Anxiety and depression    Stable, management per psychiatry.       Relevant Medications   busPIRone (BUSPAR) 15 MG tablet   Other Visit Diagnoses     Primary hypertension       Relevant Medications  hydrochlorothiazide (HYDRODIURIL) 25 MG tablet   Other Relevant Orders   Comp Met (CMET)   Hyperlipidemia, unspecified hyperlipidemia type       Relevant Medications   hydrochlorothiazide (HYDRODIURIL) 25 MG tablet   Other Relevant Orders   Lipid panel   Need for shingles vaccine       Relevant Orders   Zoster Recombinant (Shingrix ) (Completed)     General Health Maintenance: -Administer shingles vaccine today. -Plan for flu shot and COVID booster in the fall at the pharmacy. -Schedule colonoscopy with Dr. Loreta Ave. -Obtain recent mammogram and Pap smear reports from [facility]. -Schedule eye  exam. -Follow-up in 3 months.  I have discontinued Michele [name]. I have also changed her hydrochlorothiazide. Additionally, I am having her start on [name], Dexcom G7 Sensor, and busPIRone. Lastly, I am having her maintain her ALPRAZolam, EpiPen 2-Pak, sertraline, Pen Needles, blood glucose meter kit and supplies, onetouch ultrasoft, glucose blood, rosuvastatin, albuterol, metoprolol tartrate, ivabradine, valsartan, metFORMIN, and valACYclovir.  Meds ordered this encounter  Medications   metFORMIN (GLUCOPHAGE-XR) 500 MG 24 hr tablet    Sig: Take 1 tablet (500 mg total) by mouth daily with breakfast.    Dispense:  90 tablet    Refill:  3    Order Specific Question:   Supervising Provider    Answer:   Danise Edge A [[id number]]   tirzepatide ([name]) 2.5 MG/0.5ML Pen    Sig: Inject 2.5 mg into the skin once a week.    Dispense:  2 mL    Refill:  0    Order Specific Question:   Supervising Provider    Answer:   Danise Edge A [[id number]]   Continuous Glucose Sensor (DEXCOM G7 SENSOR) MISC    Sig: Apply one sensor every 10 days.    Dispense:  12 each    Refill:  5    Order Specific Question:   Supervising Provider    Answer:   Danise Edge A [[id number]]   valACYclovir (VALTREX) 1000 MG tablet    Sig: TAKE 2 TABLETS BY MOUTH NOW AND AGAIN IN 12 HOURS    Dispense:  4 tablet    Refill:  3    Order Specific Question:   Supervising Provider    Answer:   Danise Edge A [[id number]]   busPIRone (BUSPAR) 15 MG tablet    Sig: Take 1 tablet (15 mg total) by mouth 2 (two) times daily.    Order Specific Question:   Supervising Provider    Answer:   Danise Edge A [[id number]]   hydrochlorothiazide (HYDRODIURIL) 25 MG tablet    Sig: Take 1 tablet (25 mg total) by mouth daily.    Dispense:  90 tablet    Refill:  1    Order Specific Question:   Supervising Provider    Answer:   Danise Edge A [[id number]]

## 2023-03-19 NOTE — Patient Instructions (Signed)
VISIT SUMMARY:  During your visit, we discussed your hypertension, diabetes, sleep apnea, and daily headaches. Your blood pressure is well-controlled, but you've been off your diabetes medication for about 90 days. You've also been experiencing daily headaches and have not been using your CPAP machine for sleep apnea. You expressed interest in trying a new medication for your diabetes, and we also discussed the need for some routine health maintenance.  YOUR PLAN:  -HYPERTENSION: Your blood pressure is well-controlled with your current medications, hydrochlorothiazide and valsartan, so we will continue with this regimen. Hypertension is a condition where the force of the blood against the artery walls is too high.  -TYPE 2 DIABETES: You've been off your diabetes medication for about 90 days. We will resume your metformin extended release and start you on Mounjaro 2.5mg , which we may increase to 5mg  if you tolerate it well. Diabetes is a disease that affects how your body uses blood sugar.  -SLEEP APNEA: You've not been using your CPAP machine for sleep apnea, which could be contributing to your daily headaches. We will refer you to a sleep specialist for possible new treatment options and a potential CPAP machine update. Sleep apnea is a serious sleep disorder that occurs when a person's breathing is interrupted during sleep.  -HEADACHES: You've been managing your daily headaches with Aleve and Tylenol. Please avoid Aleve as it is not good for your kidneys long term.  -GENERAL HEALTH MAINTENANCE: We will administer the shingles vaccine today. You should plan for a flu shot and COVID booster in the fall at the pharmacy. We will also schedule a colonoscopy with Dr. Loreta Ave, obtain recent mammogram and Pap smear reports from [facility], and schedule an eye exam. You should follow-up in 3 months.  INSTRUCTIONS:  Please make sure to take your medications as prescribed. We will check your A1c today and  recheck it in 3 months. Please also make sure to schedule your colonoscopy with Dr. Loreta Ave, obtain your mammogram and Pap smear reports, and schedule an eye exam. We will see you again in 3 months for a follow-up.

## 2023-03-19 NOTE — Assessment & Plan Note (Signed)
Stable, management per psychiatry.  

## 2023-03-19 NOTE — Assessment & Plan Note (Signed)
Non-compliant with CPAP. Daily headaches, possibly related to untreated sleep apnea. -Refer to sleep specialist for possible new treatment options and potential CPAP machine update.

## 2023-03-21 NOTE — Telephone Encounter (Signed)
Electronic request made 

## 2023-03-26 ENCOUNTER — Other Ambulatory Visit: Payer: Self-pay | Admitting: Internal Medicine

## 2023-04-01 ENCOUNTER — Encounter: Payer: Self-pay | Admitting: Family

## 2023-04-02 ENCOUNTER — Other Ambulatory Visit: Payer: Self-pay

## 2023-04-02 MED ORDER — ROSUVASTATIN CALCIUM 5 MG PO TABS
5.0000 mg | ORAL_TABLET | Freq: Every day | ORAL | 3 refills | Status: DC
Start: 2023-04-02 — End: 2024-05-14

## 2023-04-26 ENCOUNTER — Other Ambulatory Visit: Payer: Self-pay | Admitting: Family

## 2023-04-26 ENCOUNTER — Other Ambulatory Visit (HOSPITAL_BASED_OUTPATIENT_CLINIC_OR_DEPARTMENT_OTHER): Payer: Self-pay

## 2023-04-26 DIAGNOSIS — E1165 Type 2 diabetes mellitus with hyperglycemia: Secondary | ICD-10-CM

## 2023-04-26 MED ORDER — TIRZEPATIDE 5 MG/0.5ML ~~LOC~~ SOAJ
5.0000 mg | SUBCUTANEOUS | 2 refills | Status: DC
  Filled 2023-04-26: qty 2, 28d supply, fill #0

## 2023-05-07 DIAGNOSIS — F410 Panic disorder [episodic paroxysmal anxiety] without agoraphobia: Secondary | ICD-10-CM | POA: Diagnosis not present

## 2023-05-07 DIAGNOSIS — F4321 Adjustment disorder with depressed mood: Secondary | ICD-10-CM | POA: Diagnosis not present

## 2023-05-25 ENCOUNTER — Ambulatory Visit: Payer: BC Managed Care – PPO | Admitting: Physician Assistant

## 2023-05-25 ENCOUNTER — Ambulatory Visit: Payer: BC Managed Care – PPO | Admitting: Family

## 2023-06-05 ENCOUNTER — Ambulatory Visit: Payer: BC Managed Care – PPO | Admitting: Family

## 2023-06-05 ENCOUNTER — Other Ambulatory Visit (HOSPITAL_BASED_OUTPATIENT_CLINIC_OR_DEPARTMENT_OTHER): Payer: Self-pay

## 2023-06-05 VITALS — BP 128/80 | HR 83 | Temp 98.0°F | Resp 16 | Wt 215.0 lb

## 2023-06-05 DIAGNOSIS — Z23 Encounter for immunization: Secondary | ICD-10-CM

## 2023-06-05 DIAGNOSIS — Z7985 Long-term (current) use of injectable non-insulin antidiabetic drugs: Secondary | ICD-10-CM

## 2023-06-05 DIAGNOSIS — E119 Type 2 diabetes mellitus without complications: Secondary | ICD-10-CM

## 2023-06-05 DIAGNOSIS — R300 Dysuria: Secondary | ICD-10-CM | POA: Diagnosis not present

## 2023-06-05 LAB — POC URINALSYSI DIPSTICK (AUTOMATED)
Bilirubin, UA: NEGATIVE
Glucose, UA: NEGATIVE
Ketones, UA: NEGATIVE
Nitrite, UA: NEGATIVE
Protein, UA: NEGATIVE
Spec Grav, UA: 1.01 (ref 1.010–1.025)
Urobilinogen, UA: 0.2 E.U./dL
pH, UA: 6 (ref 5.0–8.0)

## 2023-06-05 MED ORDER — TIRZEPATIDE 7.5 MG/0.5ML ~~LOC~~ SOAJ
7.5000 mg | SUBCUTANEOUS | 2 refills | Status: DC
  Filled 2023-06-05: qty 2, 28d supply, fill #0
  Filled 2023-07-01: qty 2, 28d supply, fill #1

## 2023-06-05 MED ORDER — CEPHALEXIN 500 MG PO CAPS
500.0000 mg | ORAL_CAPSULE | Freq: Two times a day (BID) | ORAL | 0 refills | Status: AC
Start: 2023-06-05 — End: 2023-06-10
  Filled 2023-06-05: qty 10, 5d supply, fill #0

## 2023-06-05 NOTE — Progress Notes (Signed)
Subjective:     Patient ID: Michele [name], female    DOB: 10/25/1970, 52 y.o.   MRN: [id number]  Chief Complaint  Patient presents with   Burning with urination    Patient complains of burning with urination    HPI  Discussed the use of AI scribe software for clinical note transcription with the patient, who gave verbal consent to proceed.  History of Present Illness   The patient, with a history of hypertension, presents with urinary symptoms of burning and frequency for the past two weeks. She reports a sensation of incomplete bladder emptying and frequent urges to urinate. She initially thought the symptoms would resolve on their own, but they have persisted. She has not seen any other healthcare providers for this issue.  In addition, the patient is on a weight loss regimen with Mounjaro, currently at a dose of 5mg . She reports no significant appetite suppression or nausea on this dose.  The patient also has a history of pulmonary issues and is scheduled for a follow-up with a pulmonologist. She has been trying mouth taping for snoring, but it is unclear if this is helping.  Lastly, the patient has received the first dose of the shingles vaccine and is due for the second dose. She is also due for a flu shot and a COVID-19 booster.     Wt Readings from Last 3 Encounters:  06/05/23 215 lb (97.5 kg)  03/19/23 215 lb (97.5 kg)  08/07/22 213 lb 12.8 oz (97 kg)        Health Maintenance Due  Topic Date Due   COVID-19 Vaccine (1) Never done   Colonoscopy  Never done   FOOT EXAM  10/04/2018   Cervical Cancer Screening (HPV/Pap Cotest)  04/05/2021   MAMMOGRAM  07/13/2021   Diabetic kidney evaluation - Urine ACR  10/19/2022   OPHTHALMOLOGY EXAM  04/28/2023   Zoster Vaccines- Shingrix (2 of 2) 05/14/2023    Past Medical History:  Diagnosis Date   Anemia    iron deficient, microcytic, hypochromic   Anxiety    Chest pain    atypical   Depression    Diabetes  mellitus without complication (HCC)    Fatigue    Fatty liver 08/15/2013   Fibroids    uterine   Migraines    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 Allena Katz at [facility]- S/p FNA March/april of two dominant nodules- benign.  Following for annual thyroid US with Dr. Allena Katz.     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 Allena Katz- endo   DILATION AND CURETTAGE OF UTERUS  05/21/2011   Procedure: DILATATION AND CURETTAGE (D&C);  Surgeon: Robley Fries;  Location: [facility];  Service: Gynecology;  Laterality: N/A;  dilitation and currettage/endometrial currettings   TUBAL LIGATION  1997   TUBOPLASTY / TUBOTUBAL ANASTOMOSIS  2005    Family History  Problem Relation Age of Onset   Hypertension Mother    Cancer Mother 76       breast   Cancer Father        colon   Stroke Brother        handicapped due to complications from spinal meningitis   Seizures Brother    Asthma Son  Arthritis Maternal Grandmother    Hypertension Maternal Grandmother    Heart attack Maternal Grandmother    Stroke Maternal Grandfather    Hypertension Maternal Uncle     Social History   Socioeconomic History   Marital status: Married    Spouse name: Customer service manager   Number of children: 2   Years of education: Not on file   Highest education level: Not on file  Occupational History   Occupation: CLAIMS AGENT    Employer: [employer]  Tobacco Use   Smoking status: Never   Smokeless tobacco: Never   Tobacco comments:    never used tobacco  Substance and Sexual Activity   Alcohol use: Yes    Comment: Once a month   Drug use: No   Sexual activity: Yes    Birth control/protection: None, Surgical  Other Topics Concern   Not on file  Social History Narrative   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   Lives with  husband, 72 year old son, and granddaughter/grandson   Regular exercise: yes   Daily caffeine: 1-2 daily         Social Determinants of Health   Financial Resource Strain: Not on file  Food Insecurity: No Food Insecurity (08/14/2022)   Received from [facility], [facility]   Hunger Vital Sign    Worried About Running Out of Food in the Last Year: Never true    Ran Out of Food in the Last Year: Never true  Transportation Needs: Not on file  Physical Activity: Not on file  Stress: Not on file  Social Connections: Unknown (08/09/2022)   Received from [facility], [facility]   Social Network    Social Network: Not on file  Intimate Partner Violence: Unknown (08/09/2022)   Received from [facility], [facility]   HITS    Physically Hurt: Not on file    Insult or Talk Down To: Not on file    Threaten Physical Harm: Not on file    Scream or Curse: Not on file    Outpatient Medications Prior to Visit  Medication Sig Dispense Refill   albuterol (VENTOLIN HFA) 108 (90 Base) MCG/ACT inhaler Inhale 2 puffs into the lungs every 6 (six) hours as needed for wheezing or shortness of breath. 8 g 0   ALPRAZolam (XANAX) 1 MG tablet Take 1 mg by mouth as needed. For anxiety     blood glucose meter kit and supplies Dispense One touch meter Use up to 2 times daily as directed. (FOR ICD-10 E10.9, E11.9).Dispense based on patient and insurance preference. Use up to 2 times daily  (FOR ICD-10 E10.9, E11.9). 1 each 0   busPIRone (BUSPAR) 15 MG tablet Take 1 tablet (15 mg total) by mouth 2 (two) times daily.     Continuous Glucose Sensor (DEXCOM G7 SENSOR) MISC Apply one sensor every 10 days. 12 each 5   EPIPEN 2-PAK 0.3 MG/0.3ML SOAJ injection daily as needed (FOR ALLERGY).      glucose blood test strip One touch Use two times a day 100 each 12   hydrochlorothiazide (HYDRODIURIL) 25 MG tablet Take 1 tablet (25 mg total) by mouth daily. 90 tablet 1   Insulin Pen Needle (PEN NEEDLES) 30G X 5  MM MISC Use one daily for insulin 50 each 1   ivabradine (CORLANOR) 5 MG TABS tablet Take 2 tablets 2 hours prior to ct 2 tablet 0   Lancets (ONETOUCH ULTRASOFT) lancets Use two times a day 100 each 12  metFORMIN (GLUCOPHAGE-XR) 500 MG 24 hr tablet Take 1 tablet (500 mg total) by mouth daily with breakfast. 90 tablet 3   metoprolol tartrate (LOPRESSOR) 100 MG tablet Take 1 tablet 2 hours prior to ct 1 tablet 0   rosuvastatin (CRESTOR) 5 MG tablet Take 1 tablet (5 mg total) by mouth daily. 90 tablet 3   sertraline (ZOLOFT) 100 MG tablet Take 100 mg by mouth daily.     valACYclovir (VALTREX) 1000 MG tablet TAKE 2 TABLETS BY MOUTH NOW AND AGAIN IN 12 HOURS 4 tablet 3   valsartan (DIOVAN) 160 MG tablet Take 1 tablet by mouth once daily 90 tablet 2   tirzepatide (MOUNJARO) 5 MG/0.5ML Pen Inject 5 mg into the skin once a week. 2 mL 2   No facility-administered medications prior to visit.    Allergies  Allergen Reactions   Amlodipine Shortness Of Breath, Anxiety, Palpitations and Other (See Comments)   Bee Venom Anaphylaxis   Bupropion Shortness Of Breath, Anxiety, Palpitations and Other (See Comments)   Clonidine Derivatives     dizziness   Hydralazine     dizziness   Imitrex [Sumatriptan] Nausea Only   Oxycodone Nausea Only    ROS    See HPI Objective:    Physical Exam Constitutional:      General: She is not in acute distress.    Appearance: Normal appearance. She is well-developed.  HENT:     Head: Normocephalic and atraumatic.     Right Ear: External ear normal.     Left Ear: External ear normal.  Eyes:     General: No scleral icterus. Neck:     Thyroid: No thyromegaly.  Cardiovascular:     Rate and Rhythm: Normal rate and regular rhythm.     Heart sounds: Normal heart sounds. No murmur heard. Pulmonary:     Effort: Pulmonary effort is normal. No respiratory distress.     Breath sounds: Normal breath sounds. No wheezing.  Abdominal:     Tenderness: There is no right  CVA tenderness or left CVA tenderness.  Musculoskeletal:     Cervical back: Neck supple.  Skin:    General: Skin is warm and dry.  Neurological:     Mental Status: She is alert and oriented to person, place, and time.  Psychiatric:        Mood and Affect: Mood normal.        Behavior: Behavior normal.        Thought Content: Thought content normal.        Judgment: Judgment normal.      BP 128/80 (BP Location: Right Arm, Patient Position: Sitting, Cuff Size: Small)   Pulse 83   Temp 98 F (36.7 C) (Oral)   Resp 16   Wt 215 lb (97.5 kg)   LMP 06/11/2013   SpO2 98%   BMI 35.78 kg/m  Wt Readings from Last 3 Encounters:  06/05/23 215 lb (97.5 kg)  03/19/23 215 lb (97.5 kg)  08/07/22 213 lb 12.8 oz (97 kg)       Assessment & Plan:   Problem List Items Addressed This Visit       Unprioritized   Diabetes type 2, controlled (HCC)    Not noting appetite suppression on mounjaro 5mg . Will increase to 7.5mg .      Relevant Medications   tirzepatide (MOUNJARO) 7.5 MG/0.5ML Pen   Burning with urination - Primary    New. UA notes leuks.  Will send for culture and begin keflex.  Relevant Orders   POCT Urinalysis Dipstick (Automated) (Completed)   Urine Culture   Other Visit Diagnoses     Needs flu shot       Relevant Orders   Flu vaccine trivalent PF, 6mos and older(Flulaval,Afluria,Fluarix,Fluzone) (Completed)       I have discontinued Yliana R. Ousley-Blakeney's tirzepatide. I am also having her start on cephALEXin and tirzepatide. Additionally, I am having her maintain her ALPRAZolam, EpiPen 2-Pak, sertraline, Pen Needles, blood glucose meter kit and supplies, onetouch ultrasoft, glucose blood, albuterol, metoprolol tartrate, ivabradine, valsartan, metFORMIN, Dexcom G7 Sensor, valACYclovir, busPIRone, hydrochlorothiazide, and rosuvastatin.  Meds ordered this encounter  Medications   cephALEXin (KEFLEX) 500 MG capsule    Sig: Take 1 capsule (500 mg total) by  mouth 2 (two) times daily for 5 days.    Dispense:  10 capsule    Refill:  0    Order Specific Question:   Supervising Provider    Answer:   Danise Edge A [[id number]]   tirzepatide (MOUNJARO) 7.5 MG/0.5ML Pen    Sig: Inject 7.5 mg into the skin once a week.    Dispense:  2 mL    Refill:  2    Order Specific Question:   Supervising Provider    Answer:   Danise Edge A [id number]

## 2023-06-05 NOTE — Patient Instructions (Signed)
VISIT SUMMARY:  During your visit, we discussed your urinary symptoms, weight management, sleep apnea, and immunizations. Your urinary symptoms suggest a urinary tract infection, for which we have started you on antibiotics. We also decided to increase your weight loss medication, Mounjaro, to 7.5mg . We discussed your sleep apnea and the use of mouth taping, and I encouraged you to follow up with your pulmonologist. Lastly, we discussed your immunizations, and I administered your flu vaccine today. I also recommended that you get a COVID-19 booster at your pharmacy and plan for your second dose of the Shingrix vaccine at your next visit.  YOUR PLAN:  -URINARY TRACT INFECTION: Your urinary symptoms suggest a urinary tract infection, which is an infection in any part of your urinary system. We have sent your urine for further testing and started you on antibiotics to treat the infection.  -WEIGHT MANAGEMENT/Diabetes: We have decided to increase your dose of Mounjaro, a weight loss medication, to 7.5mg  to help manage your weight.  -SLEEP APNEA: Sleep apnea is a potentially serious sleep disorder in which breathing repeatedly stops and starts.I encourage you to follow up with your pulmonologist as scheduled.   -IMMUNIZATIONS: Immunizations help protect you from certain diseases. Today, we administered your flu vaccine. I also recommended that you get a COVID-19 booster at your pharmacy and plan for your second dose of the Shingrix vaccine, which protects against shingles, at your next visit.  INSTRUCTIONS:  Please take your antibiotics as prescribed for your urinary tract infection. Increase your dose of Mounjaro to 7.5mg  for weight management. Continue with your mouth taping for sleep apnea and follow up with your pulmonologist on 06/07/2023. Get your COVID-19 booster at your pharmacy and plan for your second dose of the Shingrix vaccine at your next visit on 06/19/2023.

## 2023-06-05 NOTE — Assessment & Plan Note (Signed)
Not noting appetite suppression on mounjaro 5mg . Will increase to 7.5mg .

## 2023-06-05 NOTE — Assessment & Plan Note (Signed)
New. UA notes leuks.  Will send for culture and begin keflex.

## 2023-06-06 LAB — URINE CULTURE
MICRO NUMBER:: 15508775
Result:: NO GROWTH
SPECIMEN QUALITY:: ADEQUATE

## 2023-06-07 ENCOUNTER — Encounter: Payer: Self-pay | Admitting: Adult Health

## 2023-06-07 ENCOUNTER — Ambulatory Visit: Payer: BC Managed Care – PPO | Admitting: Adult Health

## 2023-06-07 VITALS — BP 126/86 | HR 93 | Ht 64.0 in | Wt 214.8 lb

## 2023-06-07 DIAGNOSIS — R0683 Snoring: Secondary | ICD-10-CM

## 2023-06-07 DIAGNOSIS — K219 Gastro-esophageal reflux disease without esophagitis: Secondary | ICD-10-CM

## 2023-06-07 DIAGNOSIS — G4733 Obstructive sleep apnea (adult) (pediatric): Secondary | ICD-10-CM | POA: Diagnosis not present

## 2023-06-07 MED ORDER — PANTOPRAZOLE SODIUM 40 MG PO TBEC: 40.0000 mg | DELAYED_RELEASE_TABLET | Freq: Every day | ORAL | 3 refills | Status: DC

## 2023-06-07 NOTE — Assessment & Plan Note (Signed)
Patient with nocturnal reflux, coughing and near vomiting-appears that she has got uncontrolled reflux.  Patient is advised on a reflux diet.  Of asked her to elevate the head of her bed 30 degrees at nighttime.  Will begin PPI with Protonix daily.  Add Pepcid 20 mg at bedtime.  To follow-up with primary care.  If symptoms or not improving will need GI referral.

## 2023-06-07 NOTE — Patient Instructions (Addendum)
Set up for home sleep study.  Work on healthy sleep regimen  Do not drive if sleepy  Work on healthy weight loss   GERD diet .  Begin Protonix 40mg  daily before meal  Begin Pepcid 20mg  At bedtime    Follow up in 6 weeks on Friday PM [facility] to discuss results and treatment plan

## 2023-06-07 NOTE — Progress Notes (Signed)
@Patient  ID: Michele [name], female    DOB: 1970-09-15, 52 y.o.   MRN: [id number]  Chief Complaint  Patient presents with   Consult    Referring provider: Sandford Craze, NP  HPI: 52 yo female seen for sleep consult to re-establish for sleep apnea Former patient of Dr. 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  Kindly referred by Sandford Craze.  She is here to reestablish for sleep apnea.  She was a former patient of Dr. Vassie Loll last seen in 2014.  Was diagnosed with mild obstructive sleep apnea and started on CPAP in 2012.  She was seen in our office with Dr. Vassie Loll in 2013 for ongoing management of her sleep apnea and CPAP care.  Patient complains that she continues to have ongoing loud snoring, restless sleep, gasping for air during the night, feels exhausted all the time.  Has to nap at least twice daily, has significant daytime sleepiness.  Goes to bed around 10 PM.  Goes to sleep pretty quickly.  Is up several times for the night.  Gets up at 6 AM.  Weight is up 30 pounds over the last 2 years.  Current weight is at 214 pounds with a BMI at 36.  Previous sleep study and 2012 showed mild sleep apnea with AHI at 8/hour.  Patient says she wore CPAP briefly but was unable to tolerate.  She tried many different mask and different pressures but was unable to tolerate. She says she also has ongoing daily headaches.  Has difficult to control hypertension.  Have a lot of trouble losing any weight.  She is a diabetic.  She takes Xanax on occasion to help with sleep and anxiety.  Has no history of congestive heart failure or stroke.  Drinks about 2 cups of caffeine daily.  Patient does work from home.  She has removable dental work.  Says that she sleeps on the couch most nights.   Epworth score is 8 out of 24.  Typically gets sleepy if she sits down to watch TV.,  Passenger of car, and afternoon and after eating lunch.  No symptoms suspicious for cataplexy or sleep paralysis.   Patient also complains of over the last 6 months that she has had significant reflux up into her throat to the point where she may vomit.  She has burning and coughing.  She has not been taking anything for reflux.  She denies any hematemesis, bloody stools, fever, unintentional weight loss, abdominal pains.  Says she does have some burning in the epigastric region at times.  Symptoms are mainly at nighttime.  No significant daytime symptoms.  Patient denies any dysphagia.  Social history patient is married.  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  She works Water engineer.  She has 2 adult children.  She lives with her husband.  She is a never smoker.  Rare alcohol.  No drug use.  Family history is positive for colon cancer breast cancer and common bile duct cancer, rheumatoid arthritis, heart disease  Past Surgical History:  Procedure Laterality Date   APPENDECTOMY  03/2009   BIOPSY THYROID  November 28, 2009   Dr. Allena Katz- endo   DILATION AND CURETTAGE OF UTERUS  05/21/2011  Procedure: DILATATION AND CURETTAGE (D&C);  Surgeon: Robley Fries;  Location: [facility];  Service: Gynecology;  Laterality: N/A;  dilitation and currettage/endometrial currettings   TUBAL LIGATION  1997   TUBOPLASTY / TUBOTUBAL ANASTOMOSIS  2005     Allergies  Allergen Reactions   Amlodipine Shortness Of Breath, Anxiety, Palpitations and Other (See Comments)   Bee Venom Anaphylaxis   Bupropion Shortness Of Breath, Anxiety, Palpitations and Other (See Comments)   Clonidine Derivatives     dizziness   Hydralazine     dizziness   Imitrex [Sumatriptan] Nausea Only   Oxycodone Nausea Only    Immunization History  Administered Date(s) Administered   Influenza Inj Mdck Quad Pf 07/16/2018   Influenza Split 05/13/2011, 06/21/2012    Influenza Whole 07/19/2010   Influenza, Seasonal, Injecte, Preservative Fre 06/05/2023   Influenza,inj,Quad PF,6+ Mos 06/15/2014, 05/26/2016, 07/29/2017, 07/01/2018, 06/24/2019, 09/21/2020   Pneumococcal Polysaccharide-23 09/13/2018   Td 12/22/2009   Tdap 10/08/2014   Zoster Recombinant(Shingrix) 03/19/2023    Past Medical History:  Diagnosis Date   Anemia    iron deficient, microcytic, hypochromic   Anxiety    Chest pain    atypical   Depression    Diabetes mellitus without complication (HCC)    Fatigue    Fatty liver 08/15/2013   Fibroids    uterine   Migraines    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 Allena Katz at [facility]- S/p FNA March/april of two dominant nodules- benign.  Following for annual thyroid US with Dr. Allena Katz.     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 8 g 0   ALPRAZolam (XANAX) 1 MG tablet Take 1 mg by mouth as needed. For anxiety     blood glucose meter kit and supplies Dispense One touch meter Use up to 2 times daily as directed. (FOR ICD-10 E10.9, E11.9).Dispense based on patient and insurance preference. Use up to 2 times daily  (FOR ICD-10 E10.9, E11.9). 1 each 0   busPIRone (BUSPAR) 15 MG tablet Take 1 tablet (15 mg total) by mouth 2 (two) times daily.     cephALEXin (KEFLEX) 500 MG capsule Take 1 capsule (500 mg total) by mouth 2 (two) times daily for 5 days. 10 capsule 0   Continuous Glucose Sensor (DEXCOM G7 SENSOR) MISC Apply one  sensor every 10 days. 12 each 5   EPIPEN 2-PAK 0.3 MG/0.3ML SOAJ injection daily as needed (FOR ALLERGY).      glucose blood test strip One touch Use two times a day 100 each 12   hydrochlorothiazide (HYDRODIURIL) 25 MG tablet Take 1 tablet (25 mg total) by mouth daily. 90 tablet 1   Insulin Pen Needle (PEN NEEDLES) 30G X 5 MM MISC Use one daily for insulin 50 each 1   ivabradine (CORLANOR) 5 MG TABS tablet Take 2 tablets 2 hours prior to ct 2 tablet 0   Lancets (ONETOUCH ULTRASOFT) lancets Use two times a day 100 each  12   metFORMIN (GLUCOPHAGE-XR) 500 MG 24 hr tablet Take 1 tablet (500 mg total) by mouth daily with breakfast. 90 tablet 3   metoprolol tartrate (LOPRESSOR) 100 MG tablet Take 1 tablet 2 hours prior to ct 1 tablet 0   rosuvastatin (CRESTOR) 5 MG tablet Take 1 tablet (5 mg total) by mouth daily. 90 tablet 3   sertraline (ZOLOFT) 100 MG tablet Take 100 mg by mouth daily.     tirzepatide (MOUNJARO) 7.5 MG/0.5ML Pen Inject 7.5 mg into the skin once a week. 2 mL 2   valACYclovir (VALTREX) 1000 MG tablet TAKE 2 TABLETS BY MOUTH NOW AND AGAIN IN 12 HOURS 4 tablet 3   valsartan (DIOVAN) 160 MG tablet Take 1 tablet by mouth once daily 90 tablet 2   No facility-administered medications prior to visit.     Review of Systems:   Constitutional:   No  weight loss, night sweats,  Fevers, chills,  +fatigue, or  lassitude.  HEENT:   No headaches,  Difficulty swallowing,  Tooth/dental problems, or  Sore throat,                No sneezing, itching, ear ache, nasal congestion, post nasal drip,   CV:  No chest pain,  Orthopnea, PND, swelling in lower extremities, anasarca, dizziness, palpitations, syncope.   GI  ++ heartburn, indigestion, no abdominal pain, nausea, vomiting, diarrhea, change in bowel habits, loss of appetite, bloody stools.   Resp: No shortness of breath with exertion or at rest.  No excess mucus, no productive cough,  No non-productive cough,  No coughing up of blood.  No  change in color of mucus.  No wheezing.  No chest wall deformity  Skin: no rash or lesions.  GU: no dysuria, change in color of urine, no urgency or frequency.  No flank pain, no hematuria   MS:  No joint pain or swelling.  No decreased range of motion.  No back pain.    Physical Exam  BP 126/86 (BP Location: Left Arm, Patient Position: Sitting, Cuff Size: Large)   Pulse 93   Ht 5\' 4"  (1.626 m)   Wt 214 lb 12.8 oz (97.4 kg)   LMP 06/11/2013   SpO2 99%   BMI 36.87 kg/m   GEN: A/Ox3; pleasant , NAD, well nourished    HEENT:  [place]/AT,  NOSE-clear, THROAT-clear, no lesions, no postnasal drip or exudate noted. Class 3 MP airway   NECK:  Supple w/ fair ROM; no JVD; normal carotid impulses w/o bruits; no thyromegaly or nodules palpated; no lymphadenopathy.    RESP  Clear  P & A; w/o, wheezes/ rales/ or rhonchi. no accessory muscle use, no dullness to percussion  CARD:  RRR, no m/r/g, no peripheral edema, pulses intact, no cyanosis or clubbing.  GI:   Soft & nt; nml bowel sounds; no organomegaly or masses detected. No guarding   Musco: Warm bil, no deformities or joint swelling noted.   Neuro: alert, no focal deficits noted.    Skin: Warm, no lesions or rashes    Lab Results:  CBC   BMET     ProBNP No results found for: "PROBNP"  Imaging: No results found.  Administration History     None           No data to display          No results found for: "NITRICOXIDE"      Assessment & Plan:   Snoring Snoring, restless sleep ,  daytime sleepiness, previous sleep apnea dx, BMI 36, gasping for air during sleep and chronic headaches all concerning for ongoing sleep apnea.  Will set patient up for home sleep study.  Patient education given on sleep apnea.  Patient has multiple comorbidities including difficulty control hypertension, diabetes, obesity.   - discussed how weight can impact sleep and risk for sleep disordered breathing - discussed options to  assist with weight loss: combination of diet modification, cardiovascular and strength training exercises   - had an extensive discussion regarding the adverse health consequences related to untreated sleep disordered breathing - specifically discussed the risks for hypertension, coronary artery disease, cardiac dysrhythmias, cerebrovascular disease, and diabetes - lifestyle modification discussed   - discussed how sleep disruption can increase risk of accidents, particularly when driving - safe driving practices were discussed   Plan  Patient Instructions  Set up for home sleep study.  Work on healthy sleep regimen  Do not drive if sleepy  Work on healthy weight loss   GERD diet .  Begin Protonix 40mg  daily before meal  Begin Pepcid 20mg  At bedtime    Follow up in 6 weeks on Friday PM [facility] to discuss results and treatment plan      GERD (gastroesophageal reflux disease) Patient with nocturnal reflux, coughing and near vomiting-appears that she has got uncontrolled reflux.  Patient is advised on a reflux diet.  Of asked her to elevate the head of her bed 30 degrees at nighttime.  Will begin PPI with Protonix daily.  Add Pepcid 20 mg at bedtime.  To follow-up with primary care.  If symptoms or not improving will need GI referral.     Rubye Oaks, NP 06/07/2023

## 2023-06-07 NOTE — Assessment & Plan Note (Addendum)
Snoring, restless sleep , daytime sleepiness, previous sleep apnea dx, BMI 36, gasping for air during sleep and chronic headaches all concerning for ongoing sleep apnea.  Will set patient up for home sleep study.  Patient education given on sleep apnea.  Patient has multiple comorbidities including difficulty control hypertension, diabetes, obesity.   - discussed how weight can impact sleep and risk for sleep disordered breathing - discussed options to assist with weight loss: combination of diet modification, cardiovascular and strength training exercises   - had an extensive discussion regarding the adverse health consequences related to untreated sleep disordered breathing - specifically discussed the risks for hypertension, coronary artery disease, cardiac dysrhythmias, cerebrovascular disease, and diabetes - lifestyle modification discussed   - discussed how sleep disruption can increase risk of accidents, particularly when driving - safe driving practices were discussed   Plan  Patient Instructions  Set up for home sleep study.  Work on healthy sleep regimen  Do not drive if sleepy  Work on healthy weight loss   GERD diet .  Begin Protonix 40mg  daily before meal  Begin Pepcid 20mg  At bedtime    Follow up in 6 weeks on Friday PM Virtual Clinic to discuss results and treatment plan

## 2023-06-15 DIAGNOSIS — H524 Presbyopia: Secondary | ICD-10-CM | POA: Diagnosis not present

## 2023-06-15 DIAGNOSIS — H04123 Dry eye syndrome of bilateral lacrimal glands: Secondary | ICD-10-CM | POA: Diagnosis not present

## 2023-06-15 DIAGNOSIS — H21233 Degeneration of iris (pigmentary), bilateral: Secondary | ICD-10-CM | POA: Diagnosis not present

## 2023-06-15 DIAGNOSIS — H52223 Regular astigmatism, bilateral: Secondary | ICD-10-CM | POA: Diagnosis not present

## 2023-06-15 DIAGNOSIS — E119 Type 2 diabetes mellitus without complications: Secondary | ICD-10-CM | POA: Diagnosis not present

## 2023-06-15 LAB — HM DIABETES EYE EXAM

## 2023-06-19 ENCOUNTER — Ambulatory Visit: Payer: BC Managed Care – PPO | Admitting: Family

## 2023-06-30 ENCOUNTER — Other Ambulatory Visit: Payer: Self-pay | Admitting: Cardiovascular Disease

## 2023-07-02 ENCOUNTER — Other Ambulatory Visit (HOSPITAL_BASED_OUTPATIENT_CLINIC_OR_DEPARTMENT_OTHER): Payer: Self-pay

## 2023-07-03 ENCOUNTER — Ambulatory Visit: Payer: BC Managed Care – PPO | Admitting: Family

## 2023-07-08 DIAGNOSIS — G4730 Sleep apnea, unspecified: Secondary | ICD-10-CM | POA: Diagnosis not present

## 2023-07-10 ENCOUNTER — Ambulatory Visit: Payer: BC Managed Care – PPO | Admitting: Family

## 2023-07-12 ENCOUNTER — Other Ambulatory Visit (HOSPITAL_BASED_OUTPATIENT_CLINIC_OR_DEPARTMENT_OTHER): Payer: Self-pay | Admitting: Cardiovascular Disease

## 2023-07-17 ENCOUNTER — Ambulatory Visit: Payer: BC Managed Care – PPO | Admitting: Family

## 2023-07-17 VITALS — BP 120/88 | HR 92 | Temp 98.6°F | Resp 16 | Ht 64.0 in | Wt 209.0 lb

## 2023-07-17 DIAGNOSIS — Z23 Encounter for immunization: Secondary | ICD-10-CM | POA: Diagnosis not present

## 2023-07-17 DIAGNOSIS — Z1211 Encounter for screening for malignant neoplasm of colon: Secondary | ICD-10-CM

## 2023-07-17 DIAGNOSIS — Z7985 Long-term (current) use of injectable non-insulin antidiabetic drugs: Secondary | ICD-10-CM | POA: Diagnosis not present

## 2023-07-17 DIAGNOSIS — D508 Other iron deficiency anemias: Secondary | ICD-10-CM | POA: Diagnosis not present

## 2023-07-17 DIAGNOSIS — R002 Palpitations: Secondary | ICD-10-CM | POA: Diagnosis not present

## 2023-07-17 DIAGNOSIS — E785 Hyperlipidemia, unspecified: Secondary | ICD-10-CM

## 2023-07-17 DIAGNOSIS — E1165 Type 2 diabetes mellitus with hyperglycemia: Secondary | ICD-10-CM | POA: Diagnosis not present

## 2023-07-17 DIAGNOSIS — E559 Vitamin D deficiency, unspecified: Secondary | ICD-10-CM

## 2023-07-17 DIAGNOSIS — K219 Gastro-esophageal reflux disease without esophagitis: Secondary | ICD-10-CM

## 2023-07-17 DIAGNOSIS — I10 Essential (primary) hypertension: Secondary | ICD-10-CM

## 2023-07-17 DIAGNOSIS — G4733 Obstructive sleep apnea (adult) (pediatric): Secondary | ICD-10-CM

## 2023-07-17 DIAGNOSIS — E119 Type 2 diabetes mellitus without complications: Secondary | ICD-10-CM

## 2023-07-17 DIAGNOSIS — F32A Depression, unspecified: Secondary | ICD-10-CM

## 2023-07-17 LAB — CBC WITH DIFFERENTIAL/PLATELET
Basophils Absolute: 0 10*3/uL (ref 0.0–0.1)
Basophils Relative: 0.3 % (ref 0.0–3.0)
Eosinophils Absolute: 0.1 10*3/uL (ref 0.0–0.7)
Eosinophils Relative: 1.1 % (ref 0.0–5.0)
HCT: 43.5 % (ref 36.0–46.0)
Hemoglobin: 14.4 g/dL (ref 12.0–15.0)
Lymphocytes Relative: 31.7 % (ref 12.0–46.0)
Lymphs Abs: 2.5 10*3/uL (ref 0.7–4.0)
MCHC: 33.2 g/dL (ref 30.0–36.0)
MCV: 84.5 fL (ref 78.0–100.0)
Monocytes Absolute: 0.5 10*3/uL (ref 0.1–1.0)
Monocytes Relative: 5.8 % (ref 3.0–12.0)
Neutro Abs: 4.7 10*3/uL (ref 1.4–7.7)
Neutrophils Relative %: 61.1 % (ref 43.0–77.0)
Platelets: 239 10*3/uL (ref 150.0–400.0)
RBC: 5.15 Mil/uL — ABNORMAL HIGH (ref 3.87–5.11)
RDW: 13.7 % (ref 11.5–15.5)
WBC: 7.8 10*3/uL (ref 4.0–10.5)

## 2023-07-17 LAB — MICROALBUMIN / CREATININE URINE RATIO
Creatinine,U: 91.2 mg/dL
Microalb Creat Ratio: 0.8 mg/g (ref 0.0–30.0)
Microalb, Ur: <0.7 mg/dL (ref 0.0–1.9)

## 2023-07-17 LAB — VITAMIN D 25 HYDROXY (VIT D DEFICIENCY, FRACTURES): VITD: 34.41 ng/mL (ref 30.00–100.00)

## 2023-07-17 LAB — HEMOGLOBIN A1C: Hgb A1c MFr Bld: 5.9 % (ref 4.6–6.5)

## 2023-07-17 LAB — BASIC METABOLIC PANEL
BUN: 14 mg/dL (ref 6–23)
CO2: 28 meq/L (ref 19–32)
Calcium: 9.5 mg/dL (ref 8.4–10.5)
Chloride: 103 meq/L (ref 96–112)
Creatinine, Ser: 0.99 mg/dL (ref 0.40–1.20)
GFR: 64.96 mL/min (ref >60.00)
Glucose, Bld: 89 mg/dL (ref 70–99)
Potassium: 4.2 meq/L (ref 3.5–5.1)
Sodium: 140 meq/L (ref 135–145)

## 2023-07-17 MED ORDER — TIRZEPATIDE 10 MG/0.5ML ~~LOC~~ SOAJ: 10.0000 mg | SUBCUTANEOUS | 2 refills | Status: DC

## 2023-07-17 MED ORDER — HYDROCHLOROTHIAZIDE 25 MG PO TABS
25.0000 mg | ORAL_TABLET | Freq: Every day | ORAL | 1 refills | Status: DC
Start: 2023-07-17 — End: 2024-03-25

## 2023-07-17 MED ORDER — VALSARTAN 160 MG PO TABS
ORAL_TABLET | ORAL | 0 refills | Status: DC
Start: 2023-07-17 — End: 2023-10-29

## 2023-07-17 NOTE — Assessment & Plan Note (Signed)
Lab Results  Component Value Date   HGBA1C 6.6 (H) 03/19/2023   HGBA1C 5.7 (A) 01/18/2022   HGBA1C 9.6 (H) 09/26/2021   Lab Results  Component Value Date   MICROALBUR 0.8 10/19/2021   LDLCALC 93 03/19/2023   CREATININE 0.91 03/19/2023

## 2023-07-17 NOTE — Assessment & Plan Note (Signed)
Lab Results  Component Value Date   CHOL 139 03/19/2023   HDL 30.20 (L) 03/19/2023   LDLCALC 93 03/19/2023   TRIG 80.0 03/19/2023   CHOLHDL 5 03/19/2023

## 2023-07-17 NOTE — Assessment & Plan Note (Signed)
  Reports feeling tired. -Check iron levels today.

## 2023-07-17 NOTE — Assessment & Plan Note (Addendum)
Initial bp elevated today.  Repeat improved.  Resume diovan. Continue hydrochlorothiazide.

## 2023-07-17 NOTE — Progress Notes (Signed)
Subjective:     Patient ID: Michele [name], female    DOB: 1971/04/05, 52 y.o.   MRN: [id number]  Chief Complaint  Patient presents with   Diabetes    Here for follow up   Hypertension    Here for follow up     Discussed the use of AI scribe software for clinical note transcription with the patient, who gave verbal consent to proceed.  History of Present Illness   The patient, with a history of hypertension, presents for a medication refill. She reports that her cardiologist declined to refill her valsartan and hydrochlorothiazide, and she has been without valsartan for a while. She also reports feeling tired and requests an iron level check.  She is also on pantoprazole for reflux, which has improved her symptoms. She reports struggling with mood regulation following the loss of her grandmother, expressing feelings of guilt and grief.  The patient is also on Mounjaro  for weight loss, but has not noticed significant changes in weight recently. She reports a slight decrease in appetite and expresses a desire to be more active.     BP Readings from Last 3 Encounters:  07/17/23 120/88  06/07/23 126/86  06/05/23 128/80        Health Maintenance Due  Topic Date Due   COVID-19 Vaccine (1) Never done   Colonoscopy  Never done   Cervical Cancer Screening (HPV/Pap Cotest)  04/05/2021   MAMMOGRAM  07/13/2021   Diabetic kidney evaluation - Urine ACR  10/19/2022   INFLUENZA VACCINE  04/12/2023    Past Medical History:  Diagnosis Date   Anemia    iron deficient, microcytic, hypochromic   Anxiety    Chest pain    atypical   Depression    Diabetes mellitus without complication (HCC)    Fatigue    Fatty liver 08/15/2013   Fibroids    uterine   Migraines    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 Allena Katz at [facility]- S/p FNA March/april of two dominant nodules- benign.  Following for annual thyroid US with Dr. Allena Katz.     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 Allena Katz- endo   DILATION AND CURETTAGE OF UTERUS  05/21/2011   Procedure: DILATATION AND CURETTAGE (D&C);  Surgeon: Robley Fries;  Location: [facility];  Service: Gynecology;  Laterality: N/A;  dilitation and currettage/endometrial currettings   TUBAL LIGATION  1997   TUBOPLASTY / TUBOTUBAL ANASTOMOSIS  2005    Family History  Problem Relation Age of Onset   Hypertension Mother    Cancer Mother 33       breast   Cancer Father        colon   Stroke Brother        handicapped due to complications from spinal meningitis   Seizures Brother    Asthma Son    Arthritis Maternal Grandmother    Hypertension Maternal Grandmother    Heart attack Maternal Grandmother    Stroke Maternal Grandfather    Hypertension Maternal Uncle     Social History   Socioeconomic History   Marital status: Married    Spouse name: [name]   Number of children: 2   Years of education: Not on file  Highest education level: Not on file  Occupational History   Occupation: CLAIMS AGENT    Employer: [employer]  Tobacco Use   Smoking status: Never   Smokeless tobacco: Never   Tobacco comments:    never used tobacco  Substance and Sexual Activity   Alcohol use: Yes    Comment: Once a month   Drug use: No   Sexual activity: Yes    Birth control/protection: None, Surgical  Other Topics Concern   Not on file  Social History Narrative   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, [facility]   Hunger Vital Sign    Worried About  Running Out of Food in the Last Year: Never true    Ran Out of Food in the Last Year: Never true  Transportation Needs: Not on file  Physical Activity: Not on file  Stress: Not on file  Social Connections: Unknown (08/09/2022)   Received from [facility], [facility]   Social Network    Social Network: Not on file  Intimate Partner Violence: Unknown (08/09/2022)   Received from [facility], [facility]   HITS    Physically Hurt: Not on file    Insult or Talk Down To: Not on file    Threaten Physical Harm: Not on file    Scream or Curse: Not on file    Outpatient Medications Prior to Visit  Medication Sig Dispense Refill   albuterol (VENTOLIN HFA) 108 (90 Base) MCG/ACT inhaler Inhale 2 puffs into the lungs every 6 (six) hours as needed for wheezing or shortness of breath. 8 g 0   ALPRAZolam (XANAX) 1 MG tablet Take 1 mg by mouth as needed. For anxiety     blood glucose meter kit and supplies Dispense One touch meter Use up to 2 times daily as directed. (FOR ICD-10 E10.9, E11.9).Dispense based on patient and insurance preference. Use up to 2 times daily  (FOR ICD-10 E10.9, E11.9). 1 each 0   busPIRone (BUSPAR) 15 MG tablet Take 1 tablet (15 mg total) by mouth 2 (two) times daily.     Continuous Glucose Sensor (DEXCOM G7 SENSOR) MISC Apply one sensor every 10 days. 12 each 5   EPIPEN 2-PAK 0.3 MG/0.3ML SOAJ injection daily as needed (FOR ALLERGY).      glucose blood test strip One touch Use two times a day 100 each 12   Insulin Pen Needle (PEN NEEDLES) 30G X 5 MM MISC Use one daily for insulin 50 each 1   ivabradine (CORLANOR) 5 MG TABS tablet Take 2 tablets 2 hours prior to ct 2 tablet 0   Lancets (ONETOUCH ULTRASOFT) lancets Use two times a day 100 each 12   metFORMIN (GLUCOPHAGE-XR) 500 MG 24 hr tablet Take 1 tablet (500 mg total) by mouth daily with breakfast. 90 tablet 3   metoprolol tartrate (LOPRESSOR) 100 MG tablet Take 1 tablet 2 hours prior to ct 1 tablet 0    pantoprazole (PROTONIX) 40 MG tablet Take 1 tablet (40 mg total) by mouth daily before breakfast. 30 tablet 3   rosuvastatin (CRESTOR) 5 MG tablet Take 1 tablet (5 mg total) by mouth daily. 90 tablet 3   sertraline (ZOLOFT)  100 MG tablet Take 100 mg by mouth daily.     valACYclovir (VALTREX) 1000 MG tablet TAKE 2 TABLETS BY MOUTH NOW AND AGAIN IN 12 HOURS 4 tablet 3   hydrochlorothiazide (HYDRODIURIL) 25 MG tablet Take 1 tablet (25 mg total) by mouth daily. 90 tablet 1   tirzepatide (MOUNJARO) 7.5 MG/0.5ML Pen Inject 7.5 mg into the skin once a week. 2 mL 2   valsartan (DIOVAN) 160 MG tablet Take 1 tablet by mouth once daily 30 tablet 0   No facility-administered medications prior to visit.    Allergies  Allergen Reactions   Amlodipine Shortness Of Breath, Anxiety, Palpitations and Other (See Comments)   Bee Venom Anaphylaxis   Bupropion Shortness Of Breath, Anxiety, Palpitations and Other (See Comments)   Clonidine Derivatives     dizziness   Hydralazine     dizziness   Imitrex [Sumatriptan] Nausea Only   Oxycodone Nausea Only    ROS See HPI    Objective:    Physical Exam Constitutional:      General: She is not in acute distress.    Appearance: Normal appearance. She is well-developed.  HENT:     Head: Normocephalic and atraumatic.     Right Ear: External ear normal.     Left Ear: External ear normal.  Eyes:     General: No scleral icterus. Neck:     Thyroid: No thyromegaly.  Cardiovascular:     Rate and Rhythm: Normal rate and regular rhythm.     Heart sounds: Normal heart sounds. No murmur heard. Pulmonary:     Effort: Pulmonary effort is normal. No respiratory distress.     Breath sounds: Normal breath sounds. No wheezing.  Musculoskeletal:     Cervical back: Neck supple.  Skin:    General: Skin is warm and dry.  Neurological:     Mental Status: She is alert and oriented to person, place, and time.  Psychiatric:        Mood and Affect: Mood normal.         Behavior: Behavior normal.        Thought Content: Thought content normal.        Judgment: Judgment normal.      BP 120/88   Pulse 92   Temp 98.6 F (37 C) (Oral)   Resp 16   Ht 5\' 4"  (1.626 m)   Wt 209 lb (94.8 kg)   LMP 06/11/2013   SpO2 99%   BMI 35.87 kg/m  Wt Readings from Last 3 Encounters:  07/17/23 209 lb (94.8 kg)  06/07/23 214 lb 12.8 oz (97.4 kg)  06/05/23 215 lb (97.5 kg)       Assessment & Plan:   Problem List Items Addressed This Visit       Unprioritized   Vitamin D deficiency   Relevant Orders   VITAMIN D 25 Hydroxy (Vit-D Deficiency, Fractures)   Type 2 diabetes mellitus with hyperglycemia, without long-term current use of insulin (HCC) - Primary    Lab Results  Component Value Date   HGBA1C 6.6 (H) 03/19/2023   HGBA1C 5.7 (A) 01/18/2022   HGBA1C 9.6 (H) 09/26/2021   Lab Results  Component Value Date   MICROALBUR 0.8 10/19/2021   LDLCALC 93 03/19/2023   CREATININE 0.91 03/19/2023         Relevant Medications   valsartan (DIOVAN) 160 MG tablet   tirzepatide (MOUNJARO) 10 MG/0.5ML Pen   Other Relevant Orders   HgB A1c   Urine  Microalbumin w/creat. ratio   Screening for colon cancer    She has not yet scheduled colonoscopy but states she will do so after her sleep study.       RESOLVED: Palpitations   Other iron deficiency anemia     Reports feeling tired. -Check iron levels today.       Relevant Orders   CBC w/Diff   Iron, TIBC and Ferritin Panel   OSA (obstructive sleep apnea)    She is working with Pulmonology and undergoing home sleep study.      GERD (gastroesophageal reflux disease)    Improved on pantoprazole.       Essential hypertension    Initial bp elevated today.  Repeat improved.  Resume diovan. Continue hydrochlorothiazide.      Relevant Medications   hydrochlorothiazide (HYDRODIURIL) 25 MG tablet   valsartan (DIOVAN) 160 MG tablet   Other Relevant Orders   Basic Metabolic Panel (BMET)   Dyslipidemia     Lab Results  Component Value Date   CHOL 139 03/19/2023   HDL 30.20 (L) 03/19/2023   LDLCALC 93 03/19/2023   TRIG 80.0 03/19/2023   CHOLHDL 5 03/19/2023         Diabetes type 2, controlled (HCC)    Wt Readings from Last 3 Encounters:  07/17/23 209 lb (94.8 kg)  06/07/23 214 lb 12.8 oz (97.4 kg)  06/05/23 215 lb (97.5 kg)   Will increase mounjaro from 7.5 mg  to 10 mg weekly as tolerated to promote further weight loss.       Relevant Medications   valsartan (DIOVAN) 160 MG tablet   tirzepatide (MOUNJARO) 10 MG/0.5ML Pen   Depression    Still grieving the loss of her grandmother. Feels she is managing ok. Continues sertraline.       Other Visit Diagnoses     Primary hypertension       Relevant Medications   hydrochlorothiazide (HYDRODIURIL) 25 MG tablet   valsartan (DIOVAN) 160 MG tablet   Needs flu shot       Need for shingles vaccine       Relevant Orders   Zoster Recombinant (Shingrix ) (Completed)      Shingrix #2 today.   I have discontinued Michele [name]'s tirzepatide. I am also having her start on tirzepatide. Additionally, I am having her maintain her ALPRAZolam, EpiPen 2-Pak, sertraline, Pen Needles, blood glucose meter kit and supplies, onetouch ultrasoft, glucose blood, albuterol, metoprolol tartrate, ivabradine, metFORMIN, Dexcom G7 Sensor, valACYclovir, busPIRone, rosuvastatin, pantoprazole, hydrochlorothiazide, and valsartan.  Meds ordered this encounter  Medications   hydrochlorothiazide (HYDRODIURIL) 25 MG tablet    Sig: Take 1 tablet (25 mg total) by mouth daily.    Dispense:  90 tablet    Refill:  1    Order Specific Question:   Supervising Provider    Answer:   Danise Edge A [[id number]]   valsartan (DIOVAN) 160 MG tablet    Sig: Take 1 tablet by mouth once daily    Dispense:  30 tablet    Refill:  0    Patient overdue for followup, please call [phone] to schedule    Order Specific Question:   Supervising Provider    Answer:    Danise Edge A [[id number]]   tirzepatide [facility]) 10 MG/0.5ML Pen    Sig: Inject 10 mg into the skin once a week.    Dispense:  6 mL    Refill:  2    Order Specific Question:   Supervising  Provider    Answer:   Danise Edge A [[id number]]

## 2023-07-17 NOTE — Assessment & Plan Note (Addendum)
Wt Readings from Last 3 Encounters:  07/17/23 209 lb (94.8 kg)  06/07/23 214 lb 12.8 oz (97.4 kg)  06/05/23 215 lb (97.5 kg)   Will increase mounjaro from 7.5 mg  to 10 mg weekly as tolerated to promote further weight loss.

## 2023-07-17 NOTE — Patient Instructions (Signed)
VISIT SUMMARY:  Today, we addressed several of your health concerns, including your hypertension, iron levels, sleep apnea, reflux, weight management, and general health maintenance. We also discussed the need for a colonoscopy.  YOUR PLAN:  -HYPERTENSION: Hypertension, or high blood pressure, is a condition where the force of the blood against your artery walls is too high. We have refilled your valsartan and hydrochlorothiazide prescriptions, which you can pick up at [facility].  -IRON DEFICIENCY: Iron deficiency occurs when your body doesn't have enough iron, leading to feelings of tiredness. We will check your iron levels today to determine if you need any supplements.  -OBSTRUCTIVE SLEEP APNEA: Obstructive sleep apnea is a condition where your breathing stops and starts during sleep. You are currently undergoing a home sleep study, and we recommend continuing with this study as planned.  -GASTROESOPHAGEAL REFLUX DISEASE: Gastroesophageal reflux disease (GERD) is a digestive disorder where stomach acid irritates the food pipe lining. You reported improvement with Pantoprazole, so please continue taking it as prescribed.  -WEIGHT MANAGEMENT: We are managing your weight with medication. Since you have not noticed significant weight loss on Mounjaro 7.5 mg, we will increase the dose to 10mg  daily to help suppress your appetite further.  -COLON CANCER SCREENING: Colon cancer screening is important for early detection of colon cancer. We discussed the need for a colonoscopy, which you plan for after you complete your sleep study.  -GENERAL HEALTH MAINTENANCE: For your general health, we administered the second dose of the Shingrix vaccine today and will check your A1C levels. Please follow up in 3 months for a routine check-up.  INSTRUCTIONS:  Please pick up your valsartan and hydrochlorothiazide prescriptions at [facility]. Continue with your home sleep study as planned. We will check your iron and  A1C levels today. Plan for a colonoscopy after completing your sleep study. Follow up in 3 months for a routine check-up.

## 2023-07-17 NOTE — Assessment & Plan Note (Signed)
Still grieving the loss of her grandmother. Feels she is managing ok. Continues sertraline.

## 2023-07-17 NOTE — Assessment & Plan Note (Signed)
Improved on pantoprazole.

## 2023-07-17 NOTE — Assessment & Plan Note (Signed)
She is working with Pulmonology and undergoing home sleep study.

## 2023-07-17 NOTE — Assessment & Plan Note (Signed)
She has not yet scheduled colonoscopy but states she will do so after her sleep study.

## 2023-07-18 LAB — IRON,TIBC AND FERRITIN PANEL
%SAT: 26 % (ref 16–45)
Ferritin: 104 ng/mL (ref 16–232)
Iron: 78 ug/dL (ref 45–160)
TIBC: 301 ug/dL (ref 250–450)

## 2023-08-03 ENCOUNTER — Telehealth: Payer: BC Managed Care – PPO | Admitting: Adult Health

## 2023-08-03 ENCOUNTER — Encounter: Payer: Self-pay | Admitting: Adult Health

## 2023-08-03 DIAGNOSIS — G4733 Obstructive sleep apnea (adult) (pediatric): Secondary | ICD-10-CM | POA: Diagnosis not present

## 2023-08-03 NOTE — Progress Notes (Signed)
Virtual Visit via Video Note  I connected with Michele [name] on 08/03/23 at  4:00 PM EST by a video enabled telemedicine application and verified that I am speaking with the correct person using two identifiers.  Location: Patient: Home  Provider: Office    I discussed the limitations of evaluation and management by telemedicine and the availability of in person appointments. The patient expressed understanding and agreed to proceed.  History of Present Illness: 52 year old female seen for sleep consult to reestablish for sleep apnea June 07, 2023 Former patient of Dr. Vassie Loll is followed for mild obstructive sleep apnea on CPAP last seen in the office 2014  Today's video visit is a 6-week follow-up.  Patient was seen last visit for a sleep consult to reestablish for sleep apnea.  Patient had previously been diagnosed with mild of sleep apnea.  She was tried on CPAP but was unable to tolerate.  She return last visit complaining of loud snoring, restless sleep and gasping for air.  Patient is a diabetic. She was set up for home sleep study.  This was done on July 08, 2023 results show mild obstructive sleep apnea-final results are pending.  We treatment options including weight loss, oral appliance and CPAP therapy.  Patient has significant dental work and would not be a candidate for oral appliance.  She would like to proceed with CPAP therapy.  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 Allena Katz at [facility]- S/p FNA March/april of two dominant nodules- benign.  Following for annual thyroid US with Dr. Allena Katz.     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 8 g 0   ALPRAZolam (XANAX) 1 MG tablet Take 1 mg by mouth as needed. For anxiety     blood glucose meter kit and supplies Dispense One touch meter Use up to 2 times daily as directed. (FOR ICD-10 E10.9, E11.9).Dispense based on patient and insurance preference. Use up to 2 times daily  (FOR ICD-10 E10.9, E11.9). 1 each 0   busPIRone (BUSPAR) 15 MG tablet Take 1 tablet (15 mg total) by mouth 2 (two) times daily.     Continuous Glucose Sensor (DEXCOM G7 SENSOR) MISC Apply one sensor every 10 days. 12 each 5   EPIPEN 2-PAK 0.3 MG/0.3ML SOAJ injection daily as needed (FOR ALLERGY).      glucose blood test strip One touch Use two times a day 100 each 12   hydrochlorothiazide (HYDRODIURIL) 25 MG tablet Take 1 tablet (25 mg total) by mouth daily. 90 tablet 1   Insulin Pen Needle (PEN NEEDLES) 30G X 5 MM MISC Use one daily for insulin 50 each 1   ivabradine (CORLANOR) 5 MG TABS tablet Take 2 tablets 2 hours prior to ct 2 tablet 0   Lancets (ONETOUCH ULTRASOFT) lancets Use  two times a day 100 each 12   metFORMIN (GLUCOPHAGE-XR) 500 MG 24 hr tablet Take 1 tablet (500 mg total) by mouth daily with breakfast. 90 tablet 3   metoprolol tartrate (LOPRESSOR) 100 MG tablet Take 1 tablet 2 hours prior to ct 1 tablet 0   pantoprazole (PROTONIX) 40 MG tablet Take 1 tablet (40 mg total) by mouth daily before breakfast. 30 tablet 3   rosuvastatin (CRESTOR) 5 MG tablet Take 1 tablet (5 mg total) by mouth daily. 90 tablet 3   sertraline (ZOLOFT) 100 MG tablet Take 100 mg by mouth daily.     tirzepatide (MOUNJARO) 10 MG/0.5ML Pen Inject 10 mg into the skin once a week. 6 mL 2   valACYclovir (VALTREX) 1000 MG tablet TAKE 2 TABLETS BY MOUTH NOW AND AGAIN IN  12 HOURS 4 tablet 3   valsartan (DIOVAN) 160 MG tablet Take 1 tablet by mouth once daily 30 tablet 0   No current facility-administered medications on file prior to visit.       Observations/Objective: [facility] 6[facility]12 > mild OSA with AHI 8/h, REM AHI 30/h corrected by CPAP 7 cm .  Assessment and Plan: Mild obstructive sleep apnea with significant symptom burden.  Patient to begin CPAP therapy wear all night long.  Will begin auto CPAP 5 to 15 cm H2O.  - discussed how weight can impact sleep and risk for sleep disordered breathing - discussed options to assist with weight loss: combination of diet modification, cardiovascular and strength training exercises   - had an extensive discussion regarding the adverse health consequences related to untreated sleep disordered breathing - specifically discussed the risks for hypertension, coronary artery disease, cardiac dysrhythmias, cerebrovascular disease, and diabetes - lifestyle modification discussed   - discussed how sleep disruption can increase risk of accidents, particularly when driving - safe driving practices were discussed   Plan  Patient Instructions  Begin CPAP at bedtime, wear all night long for at least 6 or more hours  Work on healthy sleep regimen  Do not drive if sleepy  Work on healthy weight loss  Follow-up in 3 months and as needed      Follow Up Instructions:    I discussed the assessment and treatment plan with the patient. The patient was provided an opportunity to ask questions and all were answered. The patient agreed with the plan and demonstrated an understanding of the instructions.   The patient was advised to call back or seek an in-person evaluation if the symptoms worsen or if the condition fails to improve as anticipated.  I provided 20  minutes of non-face-to-face time during this encounter.   Rubye Oaks, NP

## 2023-08-03 NOTE — Patient Instructions (Signed)
Begin CPAP at bedtime, wear all night long for at least 6 or more hours  Work on healthy sleep regimen  Do not drive if sleepy  Work on healthy weight loss  Follow-up in 3 months and as needed

## 2023-08-05 ENCOUNTER — Telehealth: Payer: Self-pay | Admitting: Pulmonary Disease

## 2023-08-05 DIAGNOSIS — G4733 Obstructive sleep apnea (adult) (pediatric): Secondary | ICD-10-CM | POA: Diagnosis not present

## 2023-08-05 NOTE — Telephone Encounter (Signed)
Call patient  Sleep study result  Date of study: 07/08/2023  Impression: Mild obstructive sleep apnea with mild oxygen desaturations  Recommendation: Options of treatment for mild obstructive sleep apnea will include  1.  CPAP therapy if there is significant daytime sleepiness or other comorbidities including history of CVA or cardiac disease -If CPAP is chosen as an option of treatment auto titrating CPAP with a pressure setting of 5-15 will be appropriate  2.  Watchful waiting with emphasis on weight loss measures, sleep position modification to optimize lateral sleep, elevating the head of the bed by about 30 degrees may also help.  3.  An oral device may be fashioned for the treatment of mild sleep disordered breathing, will involve referral to dentist.  Follow-up as previously scheduled

## 2023-08-12 ENCOUNTER — Encounter: Payer: Self-pay | Admitting: Family

## 2023-08-13 NOTE — Telephone Encounter (Signed)
Discussed at office visit.

## 2023-10-17 ENCOUNTER — Ambulatory Visit: Payer: BC Managed Care – PPO | Admitting: Family

## 2023-10-22 ENCOUNTER — Encounter: Payer: Self-pay | Admitting: Hematology & Oncology

## 2023-10-22 ENCOUNTER — Other Ambulatory Visit (HOSPITAL_BASED_OUTPATIENT_CLINIC_OR_DEPARTMENT_OTHER): Payer: Self-pay

## 2023-10-22 ENCOUNTER — Ambulatory Visit: Payer: BC Managed Care – PPO | Admitting: Family

## 2023-10-22 VITALS — BP 127/85 | HR 79 | Temp 98.7°F | Resp 16 | Ht 64.0 in | Wt 204.0 lb

## 2023-10-22 DIAGNOSIS — F419 Anxiety disorder, unspecified: Secondary | ICD-10-CM

## 2023-10-22 DIAGNOSIS — Z7985 Long-term (current) use of injectable non-insulin antidiabetic drugs: Secondary | ICD-10-CM | POA: Diagnosis not present

## 2023-10-22 DIAGNOSIS — K219 Gastro-esophageal reflux disease without esophagitis: Secondary | ICD-10-CM

## 2023-10-22 DIAGNOSIS — D508 Other iron deficiency anemias: Secondary | ICD-10-CM

## 2023-10-22 DIAGNOSIS — E785 Hyperlipidemia, unspecified: Secondary | ICD-10-CM

## 2023-10-22 DIAGNOSIS — G43809 Other migraine, not intractable, without status migrainosus: Secondary | ICD-10-CM

## 2023-10-22 DIAGNOSIS — E559 Vitamin D deficiency, unspecified: Secondary | ICD-10-CM

## 2023-10-22 DIAGNOSIS — Z23 Encounter for immunization: Secondary | ICD-10-CM | POA: Diagnosis not present

## 2023-10-22 DIAGNOSIS — Z794 Long term (current) use of insulin: Secondary | ICD-10-CM | POA: Diagnosis not present

## 2023-10-22 DIAGNOSIS — F32A Depression, unspecified: Secondary | ICD-10-CM

## 2023-10-22 DIAGNOSIS — Z1211 Encounter for screening for malignant neoplasm of colon: Secondary | ICD-10-CM

## 2023-10-22 DIAGNOSIS — E119 Type 2 diabetes mellitus without complications: Secondary | ICD-10-CM | POA: Diagnosis not present

## 2023-10-22 DIAGNOSIS — I10 Essential (primary) hypertension: Secondary | ICD-10-CM

## 2023-10-22 LAB — BASIC METABOLIC PANEL
BUN: 13 mg/dL (ref 6–23)
CO2: 26 meq/L (ref 19–32)
Calcium: 9.3 mg/dL (ref 8.4–10.5)
Chloride: 103 meq/L (ref 96–112)
Creatinine, Ser: 0.92 mg/dL (ref 0.40–1.20)
GFR: 70.81 mL/min (ref >60.00)
Glucose, Bld: 87 mg/dL (ref 70–99)
Potassium: 4.4 meq/L (ref 3.5–5.1)
Sodium: 139 meq/L (ref 135–145)

## 2023-10-22 LAB — HEMOGLOBIN A1C: Hgb A1c MFr Bld: 5.4 % (ref 4.6–6.5)

## 2023-10-22 MED ORDER — TIRZEPATIDE 12.5 MG/0.5ML ~~LOC~~ SOAJ
12.5000 mg | SUBCUTANEOUS | 1 refills | Status: DC
  Filled 2023-10-22: qty 6, 84d supply, fill #0
  Filled 2023-12-11 – 2023-12-31 (×2): qty 6, 84d supply, fill #1

## 2023-10-22 MED ORDER — RIZATRIPTAN BENZOATE 10 MG PO TABS
10.0000 mg | ORAL_TABLET | Freq: Every day | ORAL | 5 refills | Status: AC | PRN
  Filled 2023-10-22: qty 10, 30d supply, fill #0

## 2023-10-22 MED ORDER — VITAMIN D3 50 MCG (2000 UT) PO CAPS: 2000.0000 [IU] | ORAL_CAPSULE | Freq: Every day | ORAL | Status: AC

## 2023-10-22 NOTE — Patient Instructions (Signed)
 VISIT SUMMARY:  Today, you had a follow-up visit to review your diabetes, migraines, and other health conditions. Your diabetes is well-controlled, and we have made some adjustments to your medication. We also discussed your migraines and decided to try a new medication. Your other conditions, including cholesterol, anxiety, and blood pressure, are well-managed with your current medications. We also reviewed your general health maintenance and planned some routine tests and vaccinations.  YOUR PLAN:  -TYPE 2 DIABETES MELLITUS: Type 2 Diabetes Mellitus is a condition where your body does not use insulin  properly, leading to high blood sugar levels. Your diabetes is well-controlled with your current medications, Metformin  and Mounjaro , and your glucose levels are stable. We will increase your Mounjaro  dose to 12.5mg  daily to help maintain this control. Please continue to monitor your glucose levels with the Dexcom G7.  -MIGRAINES: Migraines are severe headaches that can cause throbbing pain, often accompanied by nausea and sensitivity to light and sound. You experience migraines a couple of times a week and have been using over-the-counter medications. We will prescribe Maxalt  for you to use as a rescue therapy, but please limit its use to 2 tablets in 24 hours.  -GASTROESOPHAGEAL REFLUX DISEASE: Gastroesophageal Reflux Disease (GERD) is a condition where stomach acid frequently flows back into the tube connecting your mouth and stomach, causing heartburn. Your GERD is well-controlled with Pantoprazole , and you should continue taking this medication.  -ANXIETY: Anxiety is a feeling of worry or fear that can be persistent and overwhelming. Your anxiety is managed with Buspirone , and you should continue taking this medication as prescribed.  -HYPERLIPIDEMIA: Hyperlipidemia is a condition where there are high levels of fats (lipids) in your blood, which can increase the risk of heart disease. Your  cholesterol levels are well-controlled with Crestor , and you should continue taking 5mg  daily.  -HYPERTENSION: Hypertension is high blood pressure, which can lead to serious health problems if not managed. Your blood pressure is well-controlled with Balsartan and Metoprolol , and you should continue taking these medications.  INSTRUCTIONS:  1. Administer influenza vaccine today. 2. Schedule a mammogram and Pap smear with your OBGYN. 3. A referral has been sent to Dr. Tova Fresh for a colonoscopy. 4. Follow up in 3 months.

## 2023-10-22 NOTE — Assessment & Plan Note (Signed)
 Uncontrolled. Advised her to limit OTC meds.  Trial of prn maxalt .

## 2023-10-22 NOTE — Assessment & Plan Note (Signed)
 Lab Results  Component Value Date   WBC 7.8 07/17/2023   HGB 14.4 07/17/2023   HCT 43.5 07/17/2023   MCV 84.5 07/17/2023   PLT 239.0 07/17/2023   Not on iron supplement, no longer having menses, stable.

## 2023-10-22 NOTE — Addendum Note (Signed)
 Addended by: Joye Nobles on: 10/22/2023 04:32 PM   Modules accepted: Orders

## 2023-10-22 NOTE — Assessment & Plan Note (Signed)
 Normal vitamin D  level last visit.

## 2023-10-22 NOTE — Assessment & Plan Note (Addendum)
 Lab Results  Component Value Date   HGBA1C 5.9 07/17/2023   HGBA1C 6.6 (H) 03/19/2023   HGBA1C 5.7 (A) 01/18/2022   Lab Results  Component Value Date   MICROALBUR <0.7 07/17/2023   LDLCALC 93 03/19/2023   CREATININE 0.99 07/17/2023   Reports that she is 99% in range on her Dexcom APP.  Limited weight loss with mounjaro - will increase to 12.5. Continue metformin .

## 2023-10-22 NOTE — Assessment & Plan Note (Signed)
 Stable on pantoprazole

## 2023-10-22 NOTE — Assessment & Plan Note (Addendum)
 Fair control on sertraline . Continue same. Also on buspar .

## 2023-10-22 NOTE — Assessment & Plan Note (Signed)
 Lab Results  Component Value Date   CHOL 139 03/19/2023   HDL 30.20 (L) 03/19/2023   LDLCALC 93 03/19/2023   TRIG 80.0 03/19/2023   CHOLHDL 5 03/19/2023   LDL at goal, continue crestor  5mg  daily.

## 2023-10-22 NOTE — Progress Notes (Signed)
 Subjective:     Patient ID: Michele [name], female    DOB: Oct 12, 1970, 53 y.o.   MRN: [id number]  Chief Complaint  Patient presents with   Diabetes    Here for follow up   Hypertension    Here for follow up    Diabetes  Hypertension    Discussed the use of AI scribe software for clinical note transcription with the patient, who gave verbal consent to proceed.  History of Present Illness   Michele [name] is a 53 year old female with diabetes and migraines who presents for a follow-up visit.  She has diabetes and uses a Dexcom G7 for glucose monitoring. Her blood sugar levels are well-controlled, with 99% of readings within the target range. She is on Mounjaro  10 mg and metformin . She has lost 10 pounds since September without significant appetite suppression and reports no side effects from the current dose of Mounjaro .  She experiences migraines a couple of times a week and uses over-the-counter medications like Excedrin and Tylenol . She acknowledges overuse of these medications, leading to rebound headaches. She recalls trying Imitrex  in the past without significant relief and mentions a previous trial of Maxalt  in 2017, though she cannot recall its effectiveness. She is interested in exploring other treatment options for her migraines.  She is currently taking Crestor  5 mg for cholesterol, which is well-controlled. She is also on sertraline  for mood, which she finds helpful, and buspirone  for anxiety. Her blood pressure is well-managed with valsartan . She is also taking pantoprazole  for reflux, which she reports is effective.  Her granddaughter recently had tonsillitis and RSV, requiring a visit to the emergency room where she was treated with steroids and antibiotics. Her granddaughter also had a CT scan of her tonsils due to an abscess.     Wt Readings from Last 3 Encounters:  10/22/23 204 lb (92.5 kg)  07/17/23 209 lb (94.8 kg)  06/07/23 214 lb 12.8 oz  (97.4 kg)        Health Maintenance Due  Topic Date Due   COVID-19 Vaccine (1) Never done   Colonoscopy  Never done   Pneumococcal Vaccine 34-33 Years old (2 of 2 - PCV) 09/14/2019   Cervical Cancer Screening (HPV/Pap Cotest)  04/05/2021   MAMMOGRAM  07/13/2021   INFLUENZA VACCINE  04/12/2023    Past Medical History:  Diagnosis Date   Anemia    iron deficient, microcytic, hypochromic   Anxiety    Chest pain    atypical   Depression    Diabetes mellitus without complication (HCC)    Fatigue    Fatty liver 08/15/2013   Fibroids    uterine   Migraines    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 Lydia Sams at [facility]- S/p FNA March/april of two dominant nodules- benign.  Following for annual thyroid  us  with Dr. Lydia Sams.     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 Lydia Sams- endo   DILATION AND CURETTAGE OF UTERUS  05/21/2011   Procedure: DILATATION AND CURETTAGE (D&C);  Surgeon: Shasta Deist;  Location: [facility];  Service: Gynecology;  Laterality: N/A;  dilitation and currettage/endometrial currettings   TUBAL LIGATION  1997   TUBOPLASTY / TUBOTUBAL ANASTOMOSIS  2005  Family History  Problem Relation Age of Onset   Hypertension Mother    Cancer Mother 1       breast   Cancer Father        colon   Stroke Brother        handicapped due to complications from spinal meningitis   Seizures Brother    Asthma Son    Arthritis Maternal Grandmother    Hypertension Maternal Grandmother    Heart attack Maternal Grandmother    Stroke Maternal Grandfather    Hypertension Maternal Uncle     Social History   Socioeconomic History   Marital status: Married    Spouse name: Customer service manager   Number of children: 2   Years of education: Not on file   Highest education level: Not on  file  Occupational History   Occupation: CLAIMS AGENT    Employer: [employer]  Tobacco Use   Smoking status: Never   Smokeless tobacco: Never   Tobacco comments:    never used tobacco  Substance and Sexual Activity   Alcohol use: Yes    Comment: Once a month   Drug use: No   Sexual activity: Yes    Birth control/protection: None, Surgical  Other Topics Concern   Not on file  Social History Narrative   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   Lives with husband, 67 year old son, and granddaughter/grandson   Regular exercise: yes   Daily caffeine: 1-2 daily         Social Drivers of Corporate investment banker Strain: Not on file  Food Insecurity: No Food Insecurity (08/14/2022)   Received from [facility]   Hunger Vital Sign    Worried About Running Out of Food in the Last Year: Never true    Ran Out of Food in the Last Year: Never true  Transportation Needs: Not on file  Physical Activity: Not on file  Stress: Not on file  Social Connections: Unknown (08/09/2022)   Received from [facility], [facility]   Social Network    Social Network: Not on file  Intimate Partner Violence: Unknown (08/09/2022)   [facility], [facility]   HITS    Physically Hurt: Not on file    Insult or Talk Down To: Not on file    Threaten Physical Harm: Not on file    Scream or Curse: Not on file    Outpatient Medications Prior to Visit  Medication Sig Dispense Refill   albuterol  (VENTOLIN  HFA) 108 (90 Base) MCG/ACT inhaler Inhale 2 puffs into the lungs every 6 (six) hours as needed for wheezing or shortness of breath. 8 g 0   ALPRAZolam  (XANAX ) 1 MG tablet Take 1 mg by mouth as needed. For anxiety     blood glucose meter kit and supplies Dispense One touch meter Use up to 2 times daily as directed. (FOR ICD-10 E10.9, E11.9).Dispense based on patient and insurance preference. Use up to 2 times daily  (FOR ICD-10 E10.9, E11.9). 1 each 0   busPIRone  (BUSPAR ) 15  MG tablet Take 1 tablet (15 mg total) by mouth 2 (two) times daily.     Continuous Glucose Sensor (DEXCOM G7 SENSOR) MISC Apply one sensor every 10 days. 12 each 5   EPIPEN 2-PAK 0.3 MG/0.3ML SOAJ injection daily as needed (FOR ALLERGY).      glucose blood test strip One touch Use two times a day 100 each 12   hydrochlorothiazide  (HYDRODIURIL ) 25 MG  tablet Take 1 tablet (25 mg total) by mouth daily. 90 tablet 1   Insulin  Pen Needle (PEN NEEDLES) 30G X 5 MM MISC Use one daily for insulin  50 each 1   ivabradine  (CORLANOR) 5 MG TABS tablet Take 2 tablets 2 hours prior to ct 2 tablet 0   Lancets (ONETOUCH ULTRASOFT) lancets Use two times a day 100 each 12   metFORMIN  (GLUCOPHAGE -XR) 500 MG 24 hr tablet Take 1 tablet (500 mg total) by mouth daily with breakfast. 90 tablet 3   pantoprazole  (PROTONIX ) 40 MG tablet Take 1 tablet (40 mg total) by mouth daily before breakfast. 30 tablet 3   rosuvastatin  (CRESTOR ) 5 MG tablet Take 1 tablet (5 mg total) by mouth daily. 90 tablet 3   sertraline  (ZOLOFT ) 100 MG tablet Take 100 mg by mouth daily.     valACYclovir  (VALTREX ) 1000 MG tablet TAKE 2 TABLETS BY MOUTH NOW AND AGAIN IN 12 HOURS 4 tablet 3   valsartan  (DIOVAN ) 160 MG tablet Take 1 tablet by mouth once daily 30 tablet 0   metoprolol  tartrate (LOPRESSOR ) 100 MG tablet Take 1 tablet 2 hours prior to ct 1 tablet 0   tirzepatide  (MOUNJARO ) 10 MG/0.5ML Pen Inject 10 mg into the skin once a week. 6 mL 2   No facility-administered medications prior to visit.    Allergies  Allergen Reactions   Amlodipine Shortness Of Breath, Anxiety, Palpitations and Other (See Comments)   Bee Venom Anaphylaxis   Bupropion  Shortness Of Breath, Anxiety, Palpitations and Other (See Comments)   Clonidine  Derivatives     dizziness   Hydralazine      dizziness   Imitrex  [Sumatriptan ] Nausea Only   Oxycodone  Nausea Only    ROS See HPI    Objective:    Physical Exam Constitutional:      General: She is not in acute  distress.    Appearance: Normal appearance. She is well-developed.  HENT:     Head: Normocephalic and atraumatic.     Right Ear: External ear normal.     Left Ear: External ear normal.  Eyes:     General: No scleral icterus. Neck:     Thyroid : No thyromegaly.  Cardiovascular:     Rate and Rhythm: Normal rate and regular rhythm.     Heart sounds: Normal heart sounds. No murmur heard. Pulmonary:     Effort: Pulmonary effort is normal. No respiratory distress.     Breath sounds: Normal breath sounds. No wheezing.  Musculoskeletal:     Cervical back: Neck supple.  Skin:    General: Skin is warm and dry.  Neurological:     Mental Status: She is alert and oriented to person, place, and time.  Psychiatric:        Mood and Affect: Mood normal.        Behavior: Behavior normal.        Thought Content: Thought content normal.        Judgment: Judgment normal.      BP 127/85 (BP Location: Left Arm, Patient Position: Sitting, Cuff Size: Large)   Pulse 79   Temp 98.7 F (37.1 C) (Oral)   Resp 16   Ht 5\' 4"  (1.626 m)   Wt 204 lb (92.5 kg)   LMP 06/11/2013   SpO2 99%   BMI 35.02 kg/m  Wt Readings from Last 3 Encounters:  10/22/23 204 lb (92.5 kg)  07/17/23 209 lb (94.8 kg)  06/07/23 214 lb 12.8 oz (97.4 kg)  Assessment & Plan:   Problem List Items Addressed This Visit       Unprioritized   Vitamin D  deficiency   Normal vitamin D  level last visit.       Relevant Medications   Cholecalciferol (VITAMIN D3) 50 MCG (2000 UT) capsule   Screening for colon cancer   Relevant Orders   Ambulatory referral to Gastroenterology   Other iron deficiency anemia   Lab Results  Component Value Date   WBC 7.8 07/17/2023   HGB 14.4 07/17/2023   HCT 43.5 07/17/2023   MCV 84.5 07/17/2023   PLT 239.0 07/17/2023   Not on iron supplement, no longer having menses, stable.       Migraine   Uncontrolled. Advised her to limit OTC meds.  Trial of prn maxalt .       Relevant  Medications   rizatriptan  (MAXALT ) 10 MG tablet   GERD (gastroesophageal reflux disease)   Stable on pantoprazole .       Essential hypertension   BP Readings from Last 3 Encounters:  10/22/23 127/85  07/17/23 120/88  06/07/23 126/86   Stable on hydrochlorothiazide  and valsartan , continue same.       Dyslipidemia   Lab Results  Component Value Date   CHOL 139 03/19/2023   HDL 30.20 (L) 03/19/2023   LDLCALC 93 03/19/2023   TRIG 80.0 03/19/2023   CHOLHDL 5 03/19/2023   LDL at goal, continue crestor  5mg  daily.       Diabetes type 2, controlled (HCC) - Primary   Lab Results  Component Value Date   HGBA1C 5.9 07/17/2023   HGBA1C 6.6 (H) 03/19/2023   HGBA1C 5.7 (A) 01/18/2022   Lab Results  Component Value Date   MICROALBUR <0.7 07/17/2023   LDLCALC 93 03/19/2023   CREATININE 0.99 07/17/2023   Reports that she is 99% in range on her Dexcom APP.  Limited weight loss with mounjaro - will increase to 12.5. Continue metformin .       Relevant Medications   tirzepatide  (MOUNJARO ) 12.5 MG/0.5ML Pen   Other Relevant Orders   Basic Metabolic Panel (BMET)   HgB A1c   Anxiety and depression   Fair control on sertraline . Continue same. Also on buspar .        I have discontinued Zykia R. Ousley-Blakeney's metoprolol  tartrate and tirzepatide . I am also having her start on tirzepatide , rizatriptan , and Vitamin D3. Additionally, I am having her maintain her ALPRAZolam , EpiPen 2-Pak, sertraline , Pen Needles, blood glucose meter kit and supplies, onetouch ultrasoft, glucose blood, albuterol , ivabradine , metFORMIN , Dexcom G7 Sensor, valACYclovir , busPIRone , rosuvastatin , pantoprazole , hydrochlorothiazide , and valsartan .  Meds ordered this encounter  Medications   tirzepatide  (MOUNJARO ) 12.5 MG/0.5ML Pen    Sig: Inject 12.5 mg into the skin once a week.    Dispense:  6 mL    Refill:  1    Supervising Provider:   Randie Bustle A [[id number]]   rizatriptan  (MAXALT ) 10 MG tablet    Sig:  Take 1 tablet (10 mg total) by mouth at start of migraine. May repeat in 2 hours if needed. Max 2 tablets/24 hours.    Dispense:  10 tablet    Refill:  5    Supervising Provider:   Randie Bustle A [[id number]]   Cholecalciferol (VITAMIN D3) 50 MCG (2000 UT) capsule    Sig: Take 1 capsule (2,000 Units total) by mouth daily.    Supervising Provider:   Randie Bustle A [phone]

## 2023-10-22 NOTE — Assessment & Plan Note (Addendum)
 BP Readings from Last 3 Encounters:  10/22/23 127/85  07/17/23 120/88  06/07/23 126/86   Stable on hydrochlorothiazide  and valsartan , continue same.

## 2023-10-23 ENCOUNTER — Encounter: Payer: Self-pay | Admitting: Family

## 2023-10-23 ENCOUNTER — Telehealth (HOSPITAL_BASED_OUTPATIENT_CLINIC_OR_DEPARTMENT_OTHER): Payer: Self-pay | Admitting: Cardiovascular Disease

## 2023-10-23 DIAGNOSIS — R000 Tachycardia, unspecified: Secondary | ICD-10-CM

## 2023-10-23 DIAGNOSIS — R079 Chest pain, unspecified: Secondary | ICD-10-CM

## 2023-10-23 DIAGNOSIS — I10 Essential (primary) hypertension: Secondary | ICD-10-CM

## 2023-10-23 DIAGNOSIS — R0602 Shortness of breath: Secondary | ICD-10-CM

## 2023-10-23 NOTE — Telephone Encounter (Signed)
Per Dr. Duke Salvia last office visit on 08/07/22   "We will try to get a coronary CTA to ensure we are not missing anything. Last time she was tachycardic, though her anxiety was more poorly controlled at the time. We will give her 100 mg of metoprolol and 10 mg of ivabradine prior. Also recommend that she take her Xanax 1 hour before. "   Will route to provider to ensure no new office visit with updated vitals to pursue testing.

## 2023-10-23 NOTE — Telephone Encounter (Signed)
Agree with cardiac CTA with Metoprolol tartrate 100mg  and ivabradine 10mg  two hours prior. Additionally take her home Xanax 1 hour prior. BMP yesterday normal renal function, does not need to repeat.   Recommend f/u office visit 4-6 weeks to review CTA results.   Michele Sorrow, NP

## 2023-10-23 NOTE — Telephone Encounter (Signed)
  Per Mychart scheduling message:  Patient is requesting to schedule a Cardiac CT. Order in Epic is from 2021. Please advise

## 2023-10-24 MED ORDER — IVABRADINE HCL 5 MG PO TABS: ORAL_TABLET | ORAL | 0 refills | Status: AC

## 2023-10-24 MED ORDER — METOPROLOL TARTRATE 100 MG PO TABS: ORAL_TABLET | ORAL | 0 refills | Status: DC

## 2023-10-24 NOTE — Telephone Encounter (Signed)
  The CT order in Epic is from 2021. Please place a new order so the order will fall into the workqueue for the department that schedules this testing. We do not schedule that for the patient.

## 2023-10-24 NOTE — Telephone Encounter (Addendum)
Order placed Advised patient, sent instructions in mychart

## 2023-10-24 NOTE — Addendum Note (Signed)
Addended by: Regis Bill B on: 10/24/2023 12:10 PM   Modules accepted: Orders

## 2023-10-29 ENCOUNTER — Other Ambulatory Visit: Payer: Self-pay | Admitting: Family

## 2023-10-29 DIAGNOSIS — I10 Essential (primary) hypertension: Secondary | ICD-10-CM

## 2023-10-29 NOTE — Telephone Encounter (Signed)
 CT scheduled 3/4

## 2023-10-30 MED ORDER — VALSARTAN 160 MG PO TABS: 160.0000 mg | ORAL_TABLET | Freq: Every day | ORAL | 0 refills | Status: DC

## 2023-11-01 MED ORDER — SERTRALINE HCL 100 MG PO TABS
100.0000 mg | ORAL_TABLET | Freq: Every day | ORAL | 1 refills | Status: AC
Start: 2023-11-01 — End: ?

## 2023-11-09 ENCOUNTER — Encounter (HOSPITAL_COMMUNITY): Payer: Self-pay

## 2023-11-13 ENCOUNTER — Ambulatory Visit (HOSPITAL_COMMUNITY): Admission: RE | Admit: 2023-11-13 | Payer: BC Managed Care – PPO | Source: Ambulatory Visit

## 2023-11-28 ENCOUNTER — Telehealth (HOSPITAL_COMMUNITY): Payer: Self-pay | Admitting: *Deleted

## 2023-11-28 ENCOUNTER — Encounter: Payer: Self-pay | Admitting: Hematology & Oncology

## 2023-11-28 NOTE — Telephone Encounter (Signed)
 Attempted to call patient regarding upcoming cardiac CT appointment. Unable to leave VM. Johney Frame RN Navigator Cardiac Imaging [facility] [phone] Office

## 2023-11-29 ENCOUNTER — Encounter (HOSPITAL_COMMUNITY): Payer: Self-pay

## 2023-11-29 ENCOUNTER — Ambulatory Visit (HOSPITAL_COMMUNITY): Admission: RE | Admit: 2023-11-29 | Source: Ambulatory Visit

## 2023-12-04 DIAGNOSIS — F410 Panic disorder [episodic paroxysmal anxiety] without agoraphobia: Secondary | ICD-10-CM | POA: Diagnosis not present

## 2023-12-11 ENCOUNTER — Other Ambulatory Visit (HOSPITAL_BASED_OUTPATIENT_CLINIC_OR_DEPARTMENT_OTHER): Payer: Self-pay

## 2023-12-21 NOTE — Progress Notes (Deleted)
 Cardiology Office Note:  .   Date:  12/21/2023  ID:  Michele [name], DOB 09-Jun-1971, MRN [id number] PCP: Dorrene Gaucher, NP  [facility] Providers Cardiologist:  Maudine Sos, MD { Click to update primary MD,subspecialty MD or APP then REFRESH:1}   Patient Profile: .      PMH Hypertension Palpitations Diabetes Chronic chest pain OSA  Initially seen by Dr. Theodis Fiscal 11/11/2019 for chest pain, she had been seen in the past by Dr. Arlester Ladd and had a normal cath in 2013.  At the time of her visit in March 2021 she reported chest pain for years.  She can feel her heart pounding in her ears.  She has not had improvements with exertional capacity and when she fully participates in exercise feels her heart is racing too fast.  Described as her chest getting tight and heart racing.  This occurs at rest and sometimes wakes her from sleep.  History of sleep study with mild sleep apnea and was told she did not need CPAP.  She had previously been told her chest pain was related to anxiety but did not have relief with Xanax.  Echo 08/2023 showed LVEF 50 to 55% with mild mitral regurgitation.  Cardiac monitor showed no arrhythmias.  Average HR was 103 bpm.  She only wore her heart monitor for 3 hours because it made her anxious chest.  Coronary CT was unable to be completed due to fast HR despite metoprolol, ivabradine, and Xanax.  Nuclear stress test 02/2020 revealed LVEF 65% and no ischemia.  BP was poorly controlled.  She was switched from losartan to valsartan and was started on metoprolol due to tachycardia.  She underwent MRI concerning for small vessel disease versus demyelinating disease and was referred to neurology due to frequent falls.  Last cardiology clinic visit was 08/07/2022 with Dr. Theodis Fiscal.  Her BP had gotten too low on valsartan 320 mg so it was switched to 160 mg in the evenings and 80 mg in the mornings.  She was taking metoprolol.  She reported shortness of breath on  exertion for several months.  Initially attributed to this to her anxiety but endorsed arms began to feel heavy when these episodes occurred.  24-hour blood pressure monitor was ordered.  Coronary CTA was again ordered with 100 mg of metoprolol and 10 mg of ivabradine prior.  She was also advised to take Xanax 1 hour before, however there is no record this was completed.  Sleep study 07/08/2023 revealed mild OSA and advisement to start CPAP per pulmonology.       History of Present Illness: Michele [name]   Michele [name] is a *** 53 y.o. female ***   Discussed the use of AI scribe software for clinical note transcription with the patient, who gave verbal consent to proceed.   ROS: ***       Studies Reviewed: Michele [name]         No results found for: "LIPOA"   *** Risk Assessment/Calculations:   {Does this patient have ATRIAL FIBRILLATION?:[phone]} No BP recorded.  {Refresh Note OR Click here to enter BP  :1}***       Physical Exam:   VS:  LMP 06/11/2013    Wt Readings from Last 3 Encounters:  10/22/23 204 lb (92.5 kg)  07/17/23 209 lb (94.8 kg)  06/07/23 214 lb 12.8 oz (97.4 kg)    GEN: Well nourished, well developed in no acute distress NECK: No JVD; No carotid bruits CARDIAC: ***RRR, no murmurs,  rubs, gallops RESPIRATORY:  Clear to auscultation without rales, wheezing or rhonchi  ABDOMEN: Soft, non-tender, non-distended EXTREMITIES:  No edema; No deformity     ASSESSMENT AND PLAN: .   ***    {Are you ordering a CV Procedure (e.g. stress test, cath, DCCV, TEE, etc)?   Press F2        :[id number]}  Disposition:***  Signed, Slater Duncan, NP-C

## 2023-12-24 ENCOUNTER — Ambulatory Visit (HOSPITAL_BASED_OUTPATIENT_CLINIC_OR_DEPARTMENT_OTHER): Payer: BC Managed Care – PPO | Admitting: Nurse Practitioner

## 2024-01-02 DIAGNOSIS — Z1231 Encounter for screening mammogram for malignant neoplasm of breast: Secondary | ICD-10-CM | POA: Diagnosis not present

## 2024-01-02 DIAGNOSIS — Z1331 Encounter for screening for depression: Secondary | ICD-10-CM | POA: Diagnosis not present

## 2024-01-02 DIAGNOSIS — Z01419 Encounter for gynecological examination (general) (routine) without abnormal findings: Secondary | ICD-10-CM | POA: Diagnosis not present

## 2024-01-02 LAB — HM MAMMOGRAPHY

## 2024-01-21 ENCOUNTER — Ambulatory Visit: Payer: BC Managed Care – PPO | Admitting: Family

## 2024-01-29 ENCOUNTER — Ambulatory Visit: Admitting: Family

## 2024-02-06 ENCOUNTER — Other Ambulatory Visit: Payer: Self-pay | Admitting: Medical Genetics

## 2024-02-12 ENCOUNTER — Ambulatory Visit: Admitting: Family

## 2024-02-20 ENCOUNTER — Ambulatory Visit: Admitting: Family

## 2024-02-20 VITALS — BP 125/90 | HR 88 | Temp 98.7°F | Resp 16 | Ht 64.0 in | Wt 195.0 lb

## 2024-02-20 DIAGNOSIS — H8113 Benign paroxysmal vertigo, bilateral: Secondary | ICD-10-CM

## 2024-02-20 DIAGNOSIS — E119 Type 2 diabetes mellitus without complications: Secondary | ICD-10-CM

## 2024-02-20 DIAGNOSIS — E1165 Type 2 diabetes mellitus with hyperglycemia: Secondary | ICD-10-CM

## 2024-02-20 DIAGNOSIS — E559 Vitamin D deficiency, unspecified: Secondary | ICD-10-CM

## 2024-02-20 DIAGNOSIS — G43909 Migraine, unspecified, not intractable, without status migrainosus: Secondary | ICD-10-CM | POA: Diagnosis not present

## 2024-02-20 DIAGNOSIS — I10 Essential (primary) hypertension: Secondary | ICD-10-CM

## 2024-02-20 DIAGNOSIS — G4733 Obstructive sleep apnea (adult) (pediatric): Secondary | ICD-10-CM

## 2024-02-20 DIAGNOSIS — Z7985 Long-term (current) use of injectable non-insulin antidiabetic drugs: Secondary | ICD-10-CM | POA: Diagnosis not present

## 2024-02-20 DIAGNOSIS — F32A Depression, unspecified: Secondary | ICD-10-CM

## 2024-02-20 MED ORDER — NURTEC 75 MG PO TBDP
1.0000 | ORAL_TABLET | ORAL | 2 refills | Status: DC
Start: 2024-02-20 — End: 2024-04-23

## 2024-02-20 NOTE — Progress Notes (Signed)
 Subjective:     Patient ID: Michele [name], female    DOB: 1971/09/09, 53 y.o.   MRN: [id number]  Chief Complaint  Patient presents with   Diabetes    Here for follow up   Hypertension    Here for follow up   Migraine    Patient having nausea when taking Maxalt    Well Child    HPI  Discussed the use of AI scribe software for clinical note transcription with the patient, who gave verbal consent to proceed.  History of Present Illness   Michele [name] is a 53 year old female with migraines and hypertension who presents for a routine follow-up.  She has lost approximately 14 pounds over the past seven months and is currently taking Mounjaro  12.5 mg. She experiences daily headaches and continues to have migraines. Maxalt  and Imitrex  cause nausea, so she alternates between Excedrin Migraine and extra strength Tylenol  for relief.  Her blood pressure is managed with hydrochlorothiazide , valsartan , and a baby aspirin . She notes a recent increase in blood pressure, possibly due to stress from her husband's knee replacement surgery.  She is on sertraline  for mood, which remains stable. She experiences significant fatigue, potentially related to untreated sleep apnea, and is not using a CPAP machine.  She takes vitamin D  2000 IU daily, with adequate levels previously noted. Her diabetes is managed with metformin , and her last A1c was in the fives, indicating good control.    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 Lydia Sams at [facility]- S/p FNA March/april of two dominant nodules- benign.  Following for annual thyroid  us  with Dr. Lydia Sams.     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 Lydia Sams- endo   DILATION AND CURETTAGE OF UTERUS  05/21/2011   Procedure: DILATATION AND CURETTAGE (D&C);  Surgeon: Shasta Deist;  Location: [facility];  Service: Gynecology;  Laterality: N/A;  dilitation and currettage/endometrial currettings   TUBAL LIGATION  1997   TUBOPLASTY / TUBOTUBAL ANASTOMOSIS  2005    Family History  Problem Relation Age of Onset   Hypertension Mother    Cancer Mother 28       breast   Cancer Father        colon   Stroke Brother        handicapped due to complications from spinal meningitis   Seizures Brother    Asthma Son    Arthritis Maternal Grandmother    Hypertension Maternal Grandmother    Heart attack Maternal Grandmother    Stroke Maternal Grandfather    Hypertension Maternal Uncle     Social History   Socioeconomic History   Marital status: Married  Spouse name: Barbie Lex   Number of children: 2   Years of education: Not on file   Highest education level: Not on file  Occupational History   Occupation: CLAIMS AGENT    Employer: [employer]  Tobacco Use   Smoking status: Never   Smokeless tobacco: Never   Tobacco comments:    never used tobacco  Substance and Sexual Activity   Alcohol use: Yes    Comment: Once a month   Drug use: No   Sexual activity: Yes    Birth control/protection: None, Surgical  Other Topics Concern   Not on file  Social History Narrative   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   Lives with husband, 41 year old son, and granddaughter/grandson   Regular exercise: yes   Daily caffeine: 1-2 daily         Social Drivers of Corporate investment banker Strain:  Not on file  Food Insecurity: No Food Insecurity (08/14/2022)   Received from [facility]   Hunger Vital Sign    Worried About Running Out of Food in the Last Year: Never true    Ran Out of Food in the Last Year: Never true  Transportation Needs: Not on file  Physical Activity: Not on file  Stress: Not on file  Social Connections: Unknown (08/09/2022)   [facility] from [facility]   Social Network    Social Network: Not on file  Intimate Partner Violence: Unknown (08/09/2022)   Received from [facility]   HITS    Physically Hurt: Not on file    Insult or Talk Down To: Not on file    Threaten Physical Harm: Not on file    Scream or Curse: Not on file    Outpatient Medications Prior to Visit  Medication Sig Dispense Refill   albuterol  (VENTOLIN  HFA) 108 (90 Base) MCG/ACT inhaler Inhale 2 puffs into the lungs every 6 (six) hours as needed for wheezing or shortness of breath. 8 g 0   ALPRAZolam  (XANAX ) 1 MG tablet Take 1 mg by mouth as needed. For anxiety     blood glucose meter kit and supplies Dispense One touch meter Use up to 2 times daily as directed. (FOR ICD-10 E10.9, E11.9).Dispense based on patient and insurance preference. Use up to 2 times daily  (FOR ICD-10 E10.9, E11.9). 1 each 0   busPIRone  (BUSPAR ) 15 MG tablet Take 1 tablet (15 mg total) by mouth 2 (two) times daily.     Cholecalciferol (VITAMIN D3) 50 MCG (2000 UT) capsule Take 1 capsule (2,000 Units total) by mouth daily.     Continuous Glucose Sensor (DEXCOM G7 SENSOR) MISC Apply one sensor every 10 days. 12 each 5   EPIPEN 2-PAK 0.3 MG/0.3ML SOAJ injection daily as needed (FOR ALLERGY).      glucose blood test strip One touch Use two times a day 100 each 12   hydrochlorothiazide  (HYDRODIURIL ) 25 MG tablet Take 1 tablet (25 mg total) by mouth daily. 90 tablet 1   Insulin  Pen Needle (PEN NEEDLES) 30G X 5 MM MISC Use one daily for insulin  50 each 1   Lancets (ONETOUCH ULTRASOFT) lancets Use two times a day 100 each  12   metFORMIN  (GLUCOPHAGE -XR) 500 MG 24 hr tablet Take 1 tablet (500 mg total) by mouth daily with breakfast. 90 tablet 3   rizatriptan  (MAXALT ) 10 MG tablet Take 1 tablet (10 mg total) by mouth at start of migraine. May repeat in 2 hours if needed. Max 2  tablets/24 hours. 10 tablet 5   rosuvastatin  (CRESTOR ) 5 MG tablet Take 1 tablet (5 mg total) by mouth daily. 90 tablet 3   sertraline  (ZOLOFT ) 100 MG tablet Take 1 tablet (100 mg total) by mouth daily. 90 tablet 1   tirzepatide  (MOUNJARO ) 12.5 MG/0.5ML Pen Inject 12.5 mg into the skin once a week. 6 mL 1   valACYclovir  (VALTREX ) 1000 MG tablet TAKE 2 TABLETS BY MOUTH NOW AND AGAIN IN 12 HOURS 4 tablet 3   valsartan  (DIOVAN ) 160 MG tablet Take 1 tablet (160 mg total) by mouth daily. 90 tablet 0   metoprolol  tartrate (LOPRESSOR ) 100 MG tablet Take 1 tablet (100mg ) TWO hours prior to CT scan 1 tablet 0   pantoprazole  (PROTONIX ) 40 MG tablet Take 1 tablet (40 mg total) by mouth daily before breakfast. (Patient not taking: Reported on 02/20/2024) 30 tablet 3   No facility-administered medications prior to visit.    Allergies  Allergen Reactions   Amlodipine Shortness Of Breath, Anxiety, Palpitations and Other (See Comments)   Bee Venom Anaphylaxis   Bupropion  Shortness Of Breath, Anxiety, Palpitations and Other (See Comments)   Clonidine  Derivatives     dizziness   Hydralazine      dizziness   Imitrex  [Sumatriptan ] Nausea Only   Oxycodone  Nausea Only    ROS    See HPI Objective:     Physical Exam Constitutional:      General: She is not in acute distress.    Appearance: Normal appearance. She is well-developed.  HENT:     Head: Normocephalic and atraumatic.     Right Ear: External ear normal.     Left Ear: External ear normal.   Eyes:     General: No scleral icterus.  Neck:     Thyroid : No thyromegaly.   Cardiovascular:     Rate and Rhythm: Normal rate and regular rhythm.     Heart sounds: Normal heart sounds. No murmur  heard. Pulmonary:     Effort: Pulmonary effort is normal. No respiratory distress.     Breath sounds: Normal breath sounds. No wheezing.   Musculoskeletal:     Cervical back: Neck supple.   Skin:    General: Skin is warm and dry.   Neurological:     Mental Status: She is alert and oriented to person, place, and time.   Psychiatric:        Mood and Affect: Mood normal.        Behavior: Behavior normal.        Thought Content: Thought content normal.        Judgment: Judgment normal.      BP (!) 125/90   Pulse 88   Temp 98.7 F (37.1 C) (Oral)   Resp 16   Ht 5' 4 (1.626 m)   Wt 195 lb (88.5 kg)   LMP 06/11/2013   SpO2 99%   BMI 33.47 kg/m  Wt Readings from Last 3 Encounters:  02/20/24 195 lb (88.5 kg)  10/22/23 204 lb (92.5 kg)  07/17/23 209 lb (94.8 kg)       Assessment & Plan:   Problem List Items Addressed This Visit       Unprioritized   Vitamin D  deficiency   Continues vit D 2000 international units once daily. Was normal in November, continue same.      Type 2 diabetes mellitus with hyperglycemia, without long-term current use of insulin  [facility]) - Primary   Lab Results  Component Value Date  HGBA1C 5.4 10/22/2023   HGBA1C 5.9 07/17/2023   HGBA1C 6.6 (H) 03/19/2023   Lab Results  Component Value Date   MICROALBUR <0.7 07/17/2023   LDLCALC 93 03/19/2023   CREATININE 0.92 10/22/2023   Continues mounjaro - A1C great, continues to lose weight.  Update A1C.       Relevant Orders   Basic Metabolic Panel (BMET) (Completed)   HgB A1c (Completed)   OSA (obstructive sleep apnea)   Working on getting CPAP set up.       Migraine   Uncontrolled.  Untreated OSA also likely a contributor. Has failed two triptans (imitrex  and maxalt  due to nausea).  Will give trial of nurtec 75mg  every other day for migraine prophylaxis.   Advised pt to d/c all otc pain meds due to risk of rebound headache. It seems she has been overusing otc meds.      Relevant  Medications   Rimegepant Sulfate (NURTEC) 75 MG TBDP   Essential hypertension   Blood pressure generally well-controlled. - Continue hydrochlorothiazide . - Continue valsartan .      Diabetes type 2, controlled (HCC)   Continues metformin .       Depression   Stable on sertraline  and buspar - followed by Dr. Deborra Falter.      BPV (benign positional vertigo)   No recent symptoms.        I am having Aynslee R. Ousley-Blakeney start on Nurtec. I am also having her maintain her ALPRAZolam , EpiPen 2-Pak, Pen Needles, blood glucose meter kit and supplies, onetouch ultrasoft, glucose blood, albuterol , metFORMIN , Dexcom G7 Sensor, valACYclovir , busPIRone , rosuvastatin , pantoprazole , hydrochlorothiazide , tirzepatide , rizatriptan , Vitamin D3, metoprolol  tartrate, valsartan , and sertraline .  Meds ordered this encounter  Medications   Rimegepant Sulfate (NURTEC) 75 MG TBDP    Sig: Take 1 tablet (75 mg total) by mouth every other day.    Dispense:  15 tablet    Refill:  2    Supervising Provider:   Randie Bustle A [[id number]]

## 2024-02-20 NOTE — Assessment & Plan Note (Signed)
Working on getting CPAP set up

## 2024-02-20 NOTE — Assessment & Plan Note (Signed)
 No recent symptoms

## 2024-02-20 NOTE — Assessment & Plan Note (Signed)
 Continues metformin .

## 2024-02-20 NOTE — Assessment & Plan Note (Signed)
 Lab Results  Component Value Date   HGBA1C 5.4 10/22/2023   HGBA1C 5.9 07/17/2023   HGBA1C 6.6 (H) 03/19/2023   Lab Results  Component Value Date   MICROALBUR <0.7 07/17/2023   LDLCALC 93 03/19/2023   CREATININE 0.92 10/22/2023   Continues mounjaro - A1C great, continues to lose weight.  Update A1C.

## 2024-02-20 NOTE — Assessment & Plan Note (Signed)
 Stable on sertraline  and buspar - followed by Dr. Deborra Falter.

## 2024-02-20 NOTE — Assessment & Plan Note (Signed)
 Continues vit D 2000 international units once daily. Was normal in November, continue same.

## 2024-02-21 ENCOUNTER — Telehealth: Payer: Self-pay

## 2024-02-21 ENCOUNTER — Ambulatory Visit: Payer: Self-pay | Admitting: Family

## 2024-02-21 LAB — HEMOGLOBIN A1C
Hgb A1c MFr Bld: 5.5 % (ref <5.7)
Mean Plasma Glucose: 111 mg/dL
eAG (mmol/L): 6.2 mmol/L

## 2024-02-21 LAB — BASIC METABOLIC PANEL WITH GFR
BUN: 11 mg/dL (ref 7–25)
CO2: 26 mmol/L (ref 20–32)
Calcium: 10 mg/dL (ref 8.6–10.4)
Chloride: 102 mmol/L (ref 98–110)
Creat: 0.8 mg/dL (ref 0.50–1.03)
Glucose, Bld: 83 mg/dL (ref 65–99)
Potassium: 4.4 mmol/L (ref 3.5–5.3)
Sodium: 140 mmol/L (ref 135–146)
eGFR: 88 mL/min/{1.73_m2} (ref >=60)

## 2024-02-21 NOTE — Telephone Encounter (Signed)
 Pharmacy Patient Advocate Encounter   Received notification from CoverMyMeds that prior authorization for Nurtec 75MG  dispersible tablets is required/requested.   Insurance verification completed.   The patient is insured through [facility] .   Per test claim: PA required; PA started via CoverMyMeds. KEY [id number] . Please see clinical question(s) below that I am not finding the answer to in their chart and advise.

## 2024-02-21 NOTE — Patient Instructions (Signed)
 VISIT SUMMARY:  Today, we discussed your migraines, hypertension, diabetes, sleep apnea, and general health maintenance. We made some changes to your migraine treatment and encouraged you to start CPAP therapy for your sleep apnea.  YOUR PLAN:  MIGRAINE: You have daily headaches and migraines, and you cannot tolerate triptans. -We prescribed Nurtec for your migraines. -Stop all other headache medications and start taking Nurtec every other day. -Be aware that your insurance may require prior authorization for Nurtec. -We discussed the possibility of rebound headaches from medication overuse.  TYPE 2 DIABETES MELLITUS: Your diabetes is well-controlled with Mounjaro  and metformin . -Continue taking Mounjaro  12.5 mg. -Continue taking metformin . -We ordered an A1c test to monitor your diabetes.  HYPERTENSION: Your blood pressure is generally well-controlled with your current medications. -Continue taking hydrochlorothiazide . -Continue taking valsartan . -Continue taking baby aspirin .  OBSTRUCTIVE SLEEP APNEA: You have significant fatigue, possibly related to untreated sleep apnea. -We encourage you to get fitted for and start using a CPAP machine, which may help with your headaches and fatigue.  GENERAL HEALTH MAINTENANCE: You are up to date with your mammogram and Pap smear. -Schedule your colonoscopy for late August or early September.

## 2024-02-21 NOTE — Assessment & Plan Note (Signed)
 Blood pressure generally well-controlled. - Continue hydrochlorothiazide . - Continue valsartan .

## 2024-02-21 NOTE — Assessment & Plan Note (Signed)
 Uncontrolled.  Untreated OSA also likely a contributor. Has failed two triptans (imitrex  and maxalt  due to nausea).  Will give trial of nurtec 75mg  every other day for migraine prophylaxis.   Advised pt to d/c all otc pain meds due to risk of rebound headache. It seems she has been overusing otc meds.

## 2024-02-22 ENCOUNTER — Other Ambulatory Visit (HOSPITAL_COMMUNITY)
Admission: RE | Admit: 2024-02-22 | Discharge: 2024-02-22 | Disposition: A | Discharge status: Home / Self Care | Payer: Self-pay | Source: Ambulatory Visit | Attending: Medical Genetics | Admitting: Medical Genetics

## 2024-03-03 LAB — GENECONNECT MOLECULAR SCREEN: Genetic Analysis Overall Interpretation: NEGATIVE

## 2024-03-07 NOTE — Telephone Encounter (Signed)
 PA request has expired. Archiving key.

## 2024-03-13 ENCOUNTER — Encounter: Payer: Self-pay | Admitting: Family

## 2024-03-13 DIAGNOSIS — G43809 Other migraine, not intractable, without status migrainosus: Secondary | ICD-10-CM

## 2024-03-24 ENCOUNTER — Other Ambulatory Visit (HOSPITAL_BASED_OUTPATIENT_CLINIC_OR_DEPARTMENT_OTHER): Payer: Self-pay

## 2024-03-24 ENCOUNTER — Other Ambulatory Visit: Payer: Self-pay | Admitting: Family

## 2024-03-24 MED ORDER — MOUNJARO 12.5 MG/0.5ML ~~LOC~~ SOAJ
12.5000 mg | SUBCUTANEOUS | 1 refills | Status: DC
  Filled 2024-03-24: qty 6, 84d supply, fill #0

## 2024-03-25 ENCOUNTER — Telehealth: Payer: Self-pay

## 2024-03-25 ENCOUNTER — Other Ambulatory Visit: Payer: Self-pay | Admitting: Family

## 2024-03-25 ENCOUNTER — Other Ambulatory Visit (HOSPITAL_COMMUNITY): Payer: Self-pay

## 2024-03-25 DIAGNOSIS — I10 Essential (primary) hypertension: Secondary | ICD-10-CM

## 2024-03-25 NOTE — Telephone Encounter (Signed)
 Pharmacy Patient Advocate Encounter   Received notification from Patient Advice Request messages that prior authorization for Nurtec 75mg  tabs is required/requested.   Insurance verification completed.   The patient is insured through [facility] .   Per test claim: PA required; PA submitted to above mentioned insurance via CoverMyMeds Key/confirmation #/EOC [id number] Status is pending

## 2024-03-27 NOTE — Telephone Encounter (Addendum)
 Pharmacy Patient Advocate Encounter  Received notification from [facility] that Prior Authorization for Nurtec 75mg  tabs has been DENIED.  No reason given; No denial letter received via Fax or CMM. It has been requested and will be uploaded to the media tab once received.   PA #/Case ID/Reference #: [id number]    Full denial letter indexed to media tab

## 2024-04-02 ENCOUNTER — Other Ambulatory Visit (HOSPITAL_BASED_OUTPATIENT_CLINIC_OR_DEPARTMENT_OTHER): Payer: Self-pay

## 2024-04-03 NOTE — Telephone Encounter (Signed)
 Question never answered for follow up by office staff/md, questions expired. New prior auth started.

## 2024-04-07 ENCOUNTER — Other Ambulatory Visit: Payer: Self-pay | Admitting: Family

## 2024-04-07 DIAGNOSIS — I10 Essential (primary) hypertension: Secondary | ICD-10-CM

## 2024-04-23 ENCOUNTER — Encounter: Payer: Self-pay | Admitting: Neurology

## 2024-05-13 ENCOUNTER — Other Ambulatory Visit: Payer: Self-pay | Admitting: Family

## 2024-05-13 DIAGNOSIS — E1165 Type 2 diabetes mellitus with hyperglycemia: Secondary | ICD-10-CM

## 2024-05-16 DIAGNOSIS — G4733 Obstructive sleep apnea (adult) (pediatric): Secondary | ICD-10-CM | POA: Diagnosis not present

## 2024-05-30 ENCOUNTER — Encounter: Payer: Self-pay | Admitting: Family

## 2024-05-30 DIAGNOSIS — E1165 Type 2 diabetes mellitus with hyperglycemia: Secondary | ICD-10-CM

## 2024-05-30 MED ORDER — TIRZEPATIDE 15 MG/0.5ML ~~LOC~~ SOAJ: 15.0000 mg | SUBCUTANEOUS | 0 refills | Status: DC

## 2024-05-30 NOTE — Addendum Note (Signed)
 Addended by: DARYL SETTER on: 05/30/2024 05:15 PM   Modules accepted: Orders

## 2024-06-04 DIAGNOSIS — F4321 Adjustment disorder with depressed mood: Secondary | ICD-10-CM | POA: Diagnosis not present

## 2024-06-04 DIAGNOSIS — F410 Panic disorder [episodic paroxysmal anxiety] without agoraphobia: Secondary | ICD-10-CM | POA: Diagnosis not present

## 2024-06-24 ENCOUNTER — Other Ambulatory Visit: Payer: Self-pay | Admitting: Family

## 2024-06-24 DIAGNOSIS — E1165 Type 2 diabetes mellitus with hyperglycemia: Secondary | ICD-10-CM

## 2024-06-25 ENCOUNTER — Other Ambulatory Visit (HOSPITAL_BASED_OUTPATIENT_CLINIC_OR_DEPARTMENT_OTHER): Payer: Self-pay

## 2024-06-25 ENCOUNTER — Ambulatory Visit: Admitting: Family

## 2024-06-25 ENCOUNTER — Other Ambulatory Visit: Payer: Self-pay | Admitting: Family

## 2024-06-25 VITALS — BP 118/78 | HR 93 | Temp 98.6°F | Resp 16 | Ht 64.0 in | Wt 190.0 lb

## 2024-06-25 DIAGNOSIS — E1165 Type 2 diabetes mellitus with hyperglycemia: Secondary | ICD-10-CM | POA: Diagnosis not present

## 2024-06-25 DIAGNOSIS — Z23 Encounter for immunization: Secondary | ICD-10-CM | POA: Diagnosis not present

## 2024-06-25 DIAGNOSIS — F32A Depression, unspecified: Secondary | ICD-10-CM

## 2024-06-25 DIAGNOSIS — Z7985 Long-term (current) use of injectable non-insulin antidiabetic drugs: Secondary | ICD-10-CM

## 2024-06-25 DIAGNOSIS — E785 Hyperlipidemia, unspecified: Secondary | ICD-10-CM | POA: Diagnosis not present

## 2024-06-25 DIAGNOSIS — G43809 Other migraine, not intractable, without status migrainosus: Secondary | ICD-10-CM

## 2024-06-25 DIAGNOSIS — E559 Vitamin D deficiency, unspecified: Secondary | ICD-10-CM

## 2024-06-25 DIAGNOSIS — E119 Type 2 diabetes mellitus without complications: Secondary | ICD-10-CM

## 2024-06-25 DIAGNOSIS — I10 Essential (primary) hypertension: Secondary | ICD-10-CM | POA: Diagnosis not present

## 2024-06-25 DIAGNOSIS — K219 Gastro-esophageal reflux disease without esophagitis: Secondary | ICD-10-CM

## 2024-06-25 DIAGNOSIS — G4733 Obstructive sleep apnea (adult) (pediatric): Secondary | ICD-10-CM

## 2024-06-25 LAB — BASIC METABOLIC PANEL WITH GFR
BUN: 11 mg/dL (ref 6–23)
CO2: 30 meq/L (ref 19–32)
Calcium: 9.4 mg/dL (ref 8.4–10.5)
Chloride: 101 meq/L (ref 96–112)
Creatinine, Ser: 0.82 mg/dL (ref 0.40–1.20)
GFR: 80.91 mL/min (ref >60.00)
Glucose, Bld: 93 mg/dL (ref 70–99)
Potassium: 4.1 meq/L (ref 3.5–5.1)
Sodium: 140 meq/L (ref 135–145)

## 2024-06-25 LAB — MICROALBUMIN / CREATININE URINE RATIO
Creatinine,U: 328.9 mg/dL
Microalb Creat Ratio: 4.7 mg/g (ref 0.0–30.0)
Microalb, Ur: 1.5 mg/dL (ref 0.0–1.9)

## 2024-06-25 LAB — LIPID PANEL
Cholesterol: 149 mg/dL (ref 0–200)
HDL: 41.9 mg/dL (ref >39.00)
LDL Cholesterol: 94 mg/dL (ref 0–99)
NonHDL: 107.28
Total CHOL/HDL Ratio: 4
Triglycerides: 67 mg/dL (ref 0.0–149.0)
VLDL: 13.4 mg/dL (ref 0.0–40.0)

## 2024-06-25 LAB — HEMOGLOBIN A1C: Hgb A1c MFr Bld: 5.5 % (ref 4.6–6.5)

## 2024-06-25 MED ORDER — MOUNJARO 15 MG/0.5ML ~~LOC~~ SOAJ
15.0000 mg | SUBCUTANEOUS | 2 refills | Status: AC
  Filled 2024-06-25 – 2024-07-28 (×4): qty 4, 56d supply, fill #0
  Filled 2024-09-08: qty 4, 56d supply, fill #1

## 2024-06-25 NOTE — Assessment & Plan Note (Signed)
 Lab Results  Component Value Date   HGBA1C 5.5 02/20/2024   HGBA1C 5.4 10/22/2023   HGBA1C 5.9 07/17/2023   Lab Results  Component Value Date   LDLCALC 93 03/19/2023   CREATININE 0.80 02/20/2024   Sugars have been good at home with her dexcom.  Update A1C, continues mounjaro  and metformin .

## 2024-06-25 NOTE — Assessment & Plan Note (Signed)
 Stable on hydrochlorothiazide  and valsartan .

## 2024-06-25 NOTE — Progress Notes (Signed)
 li  Subjective:     Patient ID: Michele [name], female    DOB: 1970/12/15, 53 y.o.   MRN: [id number]  Chief Complaint  Patient presents with   Hypertension    Here for follow up   Diabetes    Here for follow up   Sleep Apnea    Here for follow up    Hypertension  Diabetes    Discussed the use of AI scribe software for clinical note transcription with the patient, who gave verbal consent to proceed.  History of Present Illness  Michele [name] is a 53 year old female with sleep apnea and migraines who presents with persistent fatigue and headaches despite CPAP therapy.  She has been using her CPAP machine consistently for 38 days but feels more exhausted than before starting therapy. She experiences difficulty falling asleep and uses clear Z-Quil to aid sleep. Initially, nasal pillows caused sores inside her nose, so she switched to a mask that covers her mouth. Despite these changes, she remains extremely tired and her headaches have not improved.  She has an upcoming appointment in Neurology for further evaluation of her headaches. She was unable to obtain Nurtec and stopped taking over-the-counter medications like Tylenol  and BC powders six weeks ago due to concerns about rebound headaches. Despite these changes, her headaches persist.  She is currently taking Mounjaro  15 mg and is out of her current supply. She also takes metformin  extended release for blood sugar management. Her seven-day glucose summary shows an average of 105 mg/dL, with 02% of readings in range, 2% low, and less than 1% high.  Her mood has significant fluctuations between high and low states, but without increased energy. She feels extremely tired, often falling asleep during work hours despite using her CPAP machine for seven hours nightly. She received a referral to a therapist two weeks ago.  Her heartburn is well-controlled, and she is no longer taking pantoprazole . She manages her  blood pressure with hydrochlorothiazide  and has started taking a baby aspirin .     Health Maintenance Due  Topic Date Due   COVID-19 Vaccine (1) Never done   Diabetic kidney evaluation - Urine ACR  Never done   Hepatitis B Vaccines 19-59 Average Risk (1 of 3 - 19+ 3-dose series) Never done   Colonoscopy  Never done   Pneumococcal Vaccine: 50+ Years (2 of 2 - PCV) 09/14/2019   Influenza Vaccine  04/11/2024   OPHTHALMOLOGY EXAM  06/14/2024    Past Medical History:  Diagnosis Date   Anemia    iron deficient, microcytic, hypochromic   Anxiety    Chest pain    atypical   Depression    Diabetes mellitus without complication (HCC)    Fatigue    Fatty liver 08/15/2013   Fibroids    uterine   Migraines    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 Tobie at [facility]- S/p FNA March/april of two dominant nodules- benign.  Following for annual thyroid  us  with Dr. Tobie.     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 Tobie- endo   DILATION AND CURETTAGE OF UTERUS  05/21/2011   Procedure: DILATATION AND CURETTAGE (D&C);  Surgeon: Robbi JONELLE Render;  Location: [facility];  Service: Gynecology;  Laterality: N/A;  dilitation and currettage/endometrial currettings   TUBAL LIGATION  1997   TUBOPLASTY / TUBOTUBAL ANASTOMOSIS  2005    Family History  Problem Relation Age of Onset   Hypertension Mother    Cancer Mother 8       breast   Cancer Father        colon   Stroke Brother        handicapped due to complications from spinal meningitis   Seizures Brother    Asthma Son    Arthritis Maternal Grandmother    Hypertension Maternal Grandmother    Heart attack Maternal Grandmother    Stroke Maternal Grandfather    Hypertension Maternal Uncle     Social History   Socioeconomic History    Marital status: Married    Spouse name: Customer service manager   Number of children: 2   Years of education: Not on file   Highest education level: Not on file  Occupational History   Occupation: CLAIMS AGENT    Employer: [employer]  Tobacco Use   Smoking status: Never   Smokeless tobacco: Never   Tobacco comments:    never used tobacco  Substance and Sexual Activity   Alcohol use: Yes    Comment: Once a month   Drug use: No   Sexual activity: Yes    Birth control/protection: None, Surgical  Other Topics Concern   Not on file  Social History Narrative   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   Hunger Vital Sign    Within the past 12 months, you worried that your food would run out before you got the money to buy more.: Never true    Within the past 12 months, the food you bought just didn't last and you didn't have money to get more.: Never true  Transportation Needs: Not on file  Physical Activity: Not on file  Stress: Not on file  Social Connections: Unknown (08/09/2022)   Received from [facility]   Social Network    Social Network: Not on file  Intimate Partner Violence: Unknown (08/09/2022)   Received from [facility]   HITS    Physically Hurt: Not on file    Insult or Talk Down To: Not on file    Threaten Physical Harm: Not on file    Scream or Curse: Not on file    Outpatient Medications Prior to Visit  Medication Sig Dispense Refill   albuterol  (VENTOLIN  HFA) 108 (90 Base) MCG/ACT inhaler Inhale 2 puffs into the lungs every 6 (six) hours as needed for wheezing or shortness of breath. 8 g 0   ALPRAZolam  (XANAX ) 1 MG tablet Take 1 mg by mouth as needed. For anxiety     blood glucose meter kit and  supplies Dispense One touch meter Use up to 2 times daily as directed. (FOR ICD-10 E10.9, E11.9).Dispense based on patient and insurance preference. Use up to 2 times daily  (FOR ICD-10 E10.9, E11.9). 1 each 0   busPIRone  (BUSPAR ) 15 MG tablet Take 1 tablet (15 mg total) by mouth 2 (two) times daily.     Cholecalciferol (VITAMIN D3) 50 MCG (2000 UT) capsule Take 1 capsule (2,000 Units  total) by mouth daily.     Continuous Glucose Sensor (DEXCOM G7 SENSOR) MISC Apply one sensor every 10 days. 12 each 5   EPIPEN 2-PAK 0.3 MG/0.3ML SOAJ injection daily as needed (FOR ALLERGY).      glucose blood test strip One touch Use two times a day 100 each 12   hydrochlorothiazide  (HYDRODIURIL ) 25 MG tablet Take 1 tablet by mouth once daily 90 tablet 0   Insulin  Pen Needle (PEN NEEDLES) 30G X 5 MM MISC Use one daily for insulin  50 each 1   Lancets (ONETOUCH ULTRASOFT) lancets Use two times a day 100 each 12   metFORMIN  (GLUCOPHAGE -XR) 500 MG 24 hr tablet Take 1 tablet by mouth once daily with breakfast 90 tablet 0   rizatriptan  (MAXALT ) 10 MG tablet Take 1 tablet (10 mg total) by mouth at start of migraine. May repeat in 2 hours if needed. Max 2 tablets/24 hours. 10 tablet 5   rosuvastatin  (CRESTOR ) 5 MG tablet Take 1 tablet by mouth once daily 90 tablet 0   sertraline  (ZOLOFT ) 100 MG tablet Take 1 tablet (100 mg total) by mouth daily. 90 tablet 1   valACYclovir  (VALTREX ) 1000 MG tablet TAKE 2 TABLETS BY MOUTH NOW AND AGAIN IN 12 HOURS 4 tablet 3   valsartan  (DIOVAN ) 160 MG tablet Take 1 tablet by mouth once daily 90 tablet 1   metoprolol  tartrate (LOPRESSOR ) 100 MG tablet Take 1 tablet (100mg ) TWO hours prior to CT scan 1 tablet 0   MOUNJARO  15 MG/0.5ML Pen INJECT 1/2 (ONE-HALF) ML SUBCUTANEOUSLY  ONCE A WEEK (15 MG TOTAL) 4 mL 0   pantoprazole  (PROTONIX ) 40 MG tablet Take 1 tablet (40 mg total) by mouth daily before breakfast. 30 tablet 3   No facility-administered medications prior to visit.    Allergies   Allergen Reactions   Amlodipine Shortness Of Breath, Anxiety, Palpitations and Other (See Comments)   Bee Venom Anaphylaxis   Bupropion  Shortness Of Breath, Anxiety, Palpitations and Other (See Comments)   Clonidine  Derivatives     dizziness   Hydralazine      dizziness   Imitrex  [Sumatriptan ] Nausea Only   Oxycodone  Nausea Only    ROS    See HPI Objective:    Physical Exam Constitutional:      General: She is not in acute distress.    Appearance: Normal appearance. She is well-developed.  HENT:     Head: Normocephalic and atraumatic.     Right Ear: External ear normal.     Left Ear: External ear normal.  Eyes:     General: No scleral icterus. Neck:     Thyroid : No thyromegaly.  Cardiovascular:     Rate and Rhythm: Normal rate and regular rhythm.     Heart sounds: Normal heart sounds. No murmur heard. Pulmonary:     Effort: Pulmonary effort is normal. No respiratory distress.     Breath sounds: Normal breath sounds. No wheezing.  Musculoskeletal:     Cervical back: Neck supple.  Skin:    General: Skin is warm and dry.  Neurological:     Mental Status: She is alert and oriented to person, place, and time.  Psychiatric:        Mood and Affect: Mood normal.        Behavior: Behavior normal.        Thought Content: Thought content normal.        Judgment: Judgment normal.      BP 118/78 (BP Location: Left Arm,  Patient Position: Sitting, Cuff Size: Large)   Pulse 93   Temp 98.6 F (37 C) (Oral)   Resp 16   Ht 5' 4 (1.626 m)   Wt 190 lb (86.2 kg)   LMP 06/11/2013   SpO2 100%   BMI 32.61 kg/m  Wt Readings from Last 3 Encounters:  06/25/24 190 lb (86.2 kg)  02/20/24 195 lb (88.5 kg)  10/22/23 204 lb (92.5 kg)       Assessment & Plan:   Problem List Items Addressed This Visit       Unprioritized   Vitamin D  deficiency   Continues vit D.       Type 2 diabetes mellitus with hyperglycemia, without long-term current use of insulin  (HCC)   Relevant  Medications   tirzepatide  (MOUNJARO ) 15 MG/0.5ML Pen   Other Relevant Orders   HgB A1c   Urine Microalbumin w/creat. ratio   Basic Metabolic Panel (BMET)   OSA (obstructive sleep apnea)   Continues cpap. Still tired, following with sleep specialist.      Migraine   Was not able to get nurtec due to insurance coverage. Stopped all otc meds for migraines.  Advised to keep upcoming appointment with neurology.       GERD (gastroesophageal reflux disease)   Not using pantoprazole , stable.       Essential hypertension   Stable on hydrochlorothiazide  and valsartan .       Diabetes type 2, controlled (HCC)   Lab Results  Component Value Date   HGBA1C 5.5 02/20/2024   HGBA1C 5.4 10/22/2023   HGBA1C 5.9 07/17/2023   Lab Results  Component Value Date   LDLCALC 93 03/19/2023   CREATININE 0.80 02/20/2024   Sugars have been good at home with her dexcom.  Update A1C, continues mounjaro  and metformin .        Relevant Medications   tirzepatide  (MOUNJARO ) 15 MG/0.5ML Pen   Depression   Uncontrolled but she is working closely with psychiatry.       Other Visit Diagnoses       Needs flu shot    -  Primary   Relevant Orders   Flu vaccine trivalent PF, 6mos and older(Flulaval,Afluria,Fluarix,Fluzone)      Prevnar and flu shot today.   I have discontinued Gurleen R. Ousley-Blakeney's pantoprazole  and metoprolol  tartrate. I have also changed her Mounjaro . Additionally, I am having her maintain her ALPRAZolam , EpiPen 2-Pak, Pen Needles, blood glucose meter kit and supplies, onetouch ultrasoft, glucose blood, albuterol , Dexcom G7 Sensor, valACYclovir , busPIRone , rizatriptan , Vitamin D3, sertraline , hydrochlorothiazide , valsartan , rosuvastatin , and metFORMIN .  Meds ordered this encounter  Medications   tirzepatide  (MOUNJARO ) 15 MG/0.5ML Pen    Sig: Inject 15 mg into the skin once a week.    Dispense:  4 mL    Refill:  2    Supervising Provider:   DOMENICA BLACKBIRD A [[id number]]

## 2024-06-25 NOTE — Patient Instructions (Signed)
 VISIT SUMMARY:  Today, we addressed your persistent fatigue and headaches despite using CPAP therapy for sleep apnea. We also reviewed your diabetes management, blood pressure, and other health concerns. You received a flu shot and pneumonia vaccine, and we discussed upcoming appointments and tests.  YOUR PLAN:  ADULT WELLNESS VISIT: Routine wellness visit with controlled blood pressure. Mood issues addressed with psychiatrist. Eye exam missed. Discussed hepatitis B vaccine, opted to defer. -Administered flu shot and pneumonia vaccine. -Check A1c and urine for protein. -Reschedule eye exam. -Discuss upcoming colonoscopy before year-end.  TYPE 2 DIABETES MELLITUS: Type 2 diabetes managed with metformin  and Mounjaro . Blood glucose well-controlled with Dexcom monitoring. -Continue metformin  and Mounjaro . -Monitor blood glucose with Dexcom. -Check A1c and urine for protein.  OBSTRUCTIVE SLEEP APNEA: Managed with CPAP. Reports fatigue and headaches despite consistent use and recent mask change. -Continue CPAP therapy. -Follow up with Madelin Sumner for CPAP management.  MIGRAINE: Chronic migraines with persistent headaches. Nurtec not approved. -Referral to Dr. Tobie in Neurology for further evaluation and management.  HYPERTENSION: Hypertension well-controlled with hydrochlorothiazide . -Continue hydrochlorothiazide .  OBESITY: Obesity managed with Mounjaro . Needs refill. -Refill Mounjaro  prescription.

## 2024-06-25 NOTE — Assessment & Plan Note (Signed)
 Continues vit D.

## 2024-06-25 NOTE — Assessment & Plan Note (Signed)
 Uncontrolled but she is working closely with psychiatry.

## 2024-06-25 NOTE — Assessment & Plan Note (Signed)
 Was not able to get nurtec due to insurance coverage. Stopped all otc meds for migraines.  Advised to keep upcoming appointment with neurology.

## 2024-06-25 NOTE — Assessment & Plan Note (Signed)
 Continues cpap. Still tired, following with sleep specialist.

## 2024-06-25 NOTE — Assessment & Plan Note (Signed)
 Not using pantoprazole , stable.

## 2024-06-26 ENCOUNTER — Ambulatory Visit: Payer: Self-pay | Admitting: Family

## 2024-07-02 ENCOUNTER — Other Ambulatory Visit: Payer: Self-pay | Admitting: Family

## 2024-07-02 ENCOUNTER — Encounter: Payer: Self-pay | Admitting: Family

## 2024-07-07 ENCOUNTER — Encounter: Payer: Self-pay | Admitting: Neurology

## 2024-07-07 ENCOUNTER — Other Ambulatory Visit: Payer: Self-pay | Admitting: Family

## 2024-07-07 ENCOUNTER — Ambulatory Visit: Admitting: Neurology

## 2024-07-07 DIAGNOSIS — E1165 Type 2 diabetes mellitus with hyperglycemia: Secondary | ICD-10-CM

## 2024-07-18 ENCOUNTER — Other Ambulatory Visit (HOSPITAL_BASED_OUTPATIENT_CLINIC_OR_DEPARTMENT_OTHER): Payer: Self-pay

## 2024-07-24 ENCOUNTER — Telehealth: Payer: Self-pay

## 2024-07-24 NOTE — Telephone Encounter (Signed)
 Received signed CMN from Madelin Stank, NP. Faxed CMN back to [facility] at [phone]

## 2024-07-24 NOTE — Telephone Encounter (Signed)
 Fax confirmation received, NFN

## 2024-07-28 ENCOUNTER — Other Ambulatory Visit (HOSPITAL_BASED_OUTPATIENT_CLINIC_OR_DEPARTMENT_OTHER): Payer: Self-pay

## 2024-08-29 ENCOUNTER — Other Ambulatory Visit: Payer: Self-pay | Admitting: Family

## 2024-09-08 ENCOUNTER — Other Ambulatory Visit: Payer: Self-pay | Admitting: Family

## 2024-10-07 ENCOUNTER — Other Ambulatory Visit: Payer: Self-pay | Admitting: Family

## 2024-10-07 DIAGNOSIS — I10 Essential (primary) hypertension: Secondary | ICD-10-CM

## 2024-10-29 ENCOUNTER — Ambulatory Visit: Admitting: Family
